# Patient Record
Sex: Female | Born: 1950
Health system: Southern US, Community
[De-identification: ages and names within clinical notes are randomized; demographics above are authoritative.]

## PROBLEM LIST (undated history)

## (undated) DIAGNOSIS — IMO0002 Reserved for concepts with insufficient information to code with codable children: Secondary | ICD-10-CM

## (undated) DIAGNOSIS — T8859XA Other complications of anesthesia, initial encounter: Secondary | ICD-10-CM

## (undated) DIAGNOSIS — Z9049 Acquired absence of other specified parts of digestive tract: Secondary | ICD-10-CM

## (undated) DIAGNOSIS — Z720 Tobacco use: Secondary | ICD-10-CM

## (undated) DIAGNOSIS — M797 Fibromyalgia: Secondary | ICD-10-CM

## (undated) DIAGNOSIS — E039 Hypothyroidism, unspecified: Secondary | ICD-10-CM

## (undated) DIAGNOSIS — F329 Major depressive disorder, single episode, unspecified: Secondary | ICD-10-CM

## (undated) DIAGNOSIS — T4145XA Adverse effect of unspecified anesthetic, initial encounter: Secondary | ICD-10-CM

## (undated) DIAGNOSIS — M1990 Unspecified osteoarthritis, unspecified site: Secondary | ICD-10-CM

## (undated) DIAGNOSIS — R002 Palpitations: Secondary | ICD-10-CM

## (undated) DIAGNOSIS — Z823 Family history of stroke: Secondary | ICD-10-CM

## (undated) DIAGNOSIS — M329 Systemic lupus erythematosus, unspecified: Secondary | ICD-10-CM

## (undated) DIAGNOSIS — G43909 Migraine, unspecified, not intractable, without status migrainosus: Secondary | ICD-10-CM

## (undated) DIAGNOSIS — K219 Gastro-esophageal reflux disease without esophagitis: Secondary | ICD-10-CM

## (undated) DIAGNOSIS — F32A Depression, unspecified: Secondary | ICD-10-CM

## (undated) DIAGNOSIS — M359 Systemic involvement of connective tissue, unspecified: Secondary | ICD-10-CM

## (undated) DIAGNOSIS — R079 Chest pain, unspecified: Secondary | ICD-10-CM

## (undated) HISTORY — DX: Acquired absence of other specified parts of digestive tract: Z90.49

## (undated) HISTORY — DX: Systemic lupus erythematosus, unspecified: M32.9

## (undated) HISTORY — DX: Depression, unspecified: F32.A

## (undated) HISTORY — PX: CARDIAC CATHETERIZATION: SHX172

## (undated) HISTORY — DX: Major depressive disorder, single episode, unspecified: F32.9

## (undated) HISTORY — DX: Gastro-esophageal reflux disease without esophagitis: K21.9

## (undated) HISTORY — DX: Hypothyroidism, unspecified: E03.9

## (undated) HISTORY — DX: Unspecified osteoarthritis, unspecified site: M19.90

## (undated) HISTORY — PX: BILATERAL OOPHORECTOMY: SHX1221

## (undated) HISTORY — DX: Migraine, unspecified, not intractable, without status migrainosus: G43.909

## (undated) HISTORY — DX: Chest pain, unspecified: R07.9

## (undated) HISTORY — DX: Fibromyalgia: M79.7

## (undated) HISTORY — DX: Tobacco use: Z72.0

## (undated) HISTORY — PX: CHOLECYSTECTOMY: SHX55

## (undated) HISTORY — DX: Family history of stroke: Z82.3

## (undated) HISTORY — DX: Palpitations: R00.2

## (undated) HISTORY — DX: Reserved for concepts with insufficient information to code with codable children: IMO0002

---
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
## 2009-05-31 ENCOUNTER — Ambulatory Visit: Payer: Self-pay | Admitting: Cardiology

## 2009-05-31 ENCOUNTER — Encounter: Payer: Self-pay | Admitting: Cardiology

## 2009-05-31 ENCOUNTER — Ambulatory Visit: Payer: Self-pay | Admitting: Cardiovascular Disease

## 2009-05-31 ENCOUNTER — Inpatient Hospital Stay (HOSPITAL_COMMUNITY): Admission: EM | Admit: 2009-05-31 | Discharge: 2009-06-01 | Payer: Self-pay | Admitting: Cardiology

## 2009-05-31 ENCOUNTER — Encounter: Payer: Self-pay | Admitting: Physician Assistant

## 2009-06-01 ENCOUNTER — Encounter (INDEPENDENT_AMBULATORY_CARE_PROVIDER_SITE_OTHER): Payer: Self-pay | Admitting: Physician Assistant

## 2009-06-01 ENCOUNTER — Encounter: Payer: Self-pay | Admitting: Cardiology

## 2009-06-21 ENCOUNTER — Encounter: Payer: Self-pay | Admitting: Cardiology

## 2009-06-21 DIAGNOSIS — F17200 Nicotine dependence, unspecified, uncomplicated: Secondary | ICD-10-CM | POA: Insufficient documentation

## 2009-06-21 DIAGNOSIS — IMO0001 Reserved for inherently not codable concepts without codable children: Secondary | ICD-10-CM | POA: Insufficient documentation

## 2009-06-21 DIAGNOSIS — F329 Major depressive disorder, single episode, unspecified: Secondary | ICD-10-CM | POA: Insufficient documentation

## 2009-06-21 DIAGNOSIS — I200 Unstable angina: Secondary | ICD-10-CM | POA: Insufficient documentation

## 2009-06-21 DIAGNOSIS — E039 Hypothyroidism, unspecified: Secondary | ICD-10-CM | POA: Insufficient documentation

## 2009-06-21 DIAGNOSIS — M329 Systemic lupus erythematosus, unspecified: Secondary | ICD-10-CM | POA: Insufficient documentation

## 2009-06-21 DIAGNOSIS — R072 Precordial pain: Secondary | ICD-10-CM | POA: Insufficient documentation

## 2009-07-16 ENCOUNTER — Encounter: Payer: Self-pay | Admitting: Cardiology

## 2009-07-23 ENCOUNTER — Ambulatory Visit: Payer: Self-pay | Admitting: Cardiology

## 2009-07-23 DIAGNOSIS — F411 Generalized anxiety disorder: Secondary | ICD-10-CM | POA: Insufficient documentation

## 2010-06-04 NOTE — Letter (Signed)
Summary: Appointment -missed  [facility] at [facility] [address]   [place], [place] [address]   Phone: [phone]  Fax: [phone]     June 21, 2009 MRN: [id number]      Susan Hodge [address] [place], [place]  [address]      Dear [name],  Our records indicate you missed your appointment on June 21, 2009                        with Dr. Andee Lineman.   It is very important that we reach you to reschedule this appointment. We look forward to participating in your health care needs.   Please contact us at the number listed above at your earliest convenience to reschedule this appointment.   Sincerely,    Glass blower/designer

## 2010-06-04 NOTE — Consult Note (Signed)
Summary: CARDIOLOGY CONSULT. [facility]  CARDIOLOGY CONSULT. [facility]   Imported By: Zachary George 06/21/2009 08:41:12  _____________________________________________________________________  External Attachment:    Type:   Image     Comment:   External Document

## 2010-06-04 NOTE — Assessment & Plan Note (Signed)
Summary: [facility]   Visit Type:  Follow-up Primary Provider:  vyas  CC:  follow-up visit.  History of Present Illness: the patient is a 60 J female recently hospitalized with chest pain. Because of multiple cardiac risk factors the patient was referred for cardiac catheterization. She was found to have no significant coronary artery disease. She has history of SLE and GERD. The patient states that she is under significant stress. She feels very anxious and irritable. She has difficulty sleeping and has frequent ruminating thoughts. Sometimes she feels she is experiencing shaking spells.X.  Cardiovascular perspective she can quite well. She reports no orthopnea PND. She reports no palpitations or syncope.  Preventive Screening-Counseling & Management  Alcohol-Tobacco     Smoking Status: current     Smoking Cessation Counseling: yes     Packs/Day: 0.75  Current Medications (verified): 1)  Aspirin 81 Mg Chew (Aspirin) .... Take 1 Tablet By Mouth Once A Day 2)  Fioricet 50-325-40 Mg Tabs (Butalbital-Apap-Caffeine) .... Take One By Mouth Every Six Hours As Needed 3)  Prevacid 30 Mg Cpdr (Lansoprazole) .... Take 1 Tablet By Mouth Once A Day 4)  Vitamin D 1000 Unit Tabs (Cholecalciferol) .... Take 1 Tablet By Mouth Once A Day 5)  Vitamin E 1000 Unit Caps (Vitamin E) .... Take 1 Tablet By Mouth Once A Day 6)  Alprazolam 0.5 Mg Tabs (Alprazolam) .... Take 1 Tablet By Mouth Once A Day 7)  Valproic Acid 250 Mg Caps (Valproic Acid) .... Take Twice A Day  Allergies (verified): 1)  ! * Amitriptyline 2)  ! Morphine 3)  ! Lasix 4)  ! Nsaids 5)  ! Neurontin 6)  ! Vicodin 7)  ! * Zanaflex 8)  ! Plaquenil 9)  ! * Allegra 10)  ! * Topamax  Comments:  Nurse/Medical Assistant: The patient's medications and allergies were reviewed with the patient and were updated in the Medication and Allergy Lists. Youlanda Mighty RN (July 23, 2009 3:30 PM)  Past History:  Past Medical  History: Last updated: Jul 08, 2009 Chest pains  Tobacco use.   Allergy or intolerance to Allegra, amitriptyline, doxepin,       nonsteroidals, Topamax, morphine, Neurontin, Vicodin, Zanaflex,       Lasix, and Plaquenil.   Hypothyroidism.   Lupus.   Fibromyalgia.   Gastroesophageal reflux disease.   Degenerative joint disease.   Depression.   Migraines.  Palpitations.  Status post cholecystectomy, appendectomy, and bilateral       oophorectomy.  Family history of cerebrovascular accident in her father.   Past Surgical History: Last updated: 07/08/09 Cardiac Catheterization Cholecystectomy Bilateral oophorectomy  Family History: Last updated: 2009/07/08 FATHER DIED IN 70'S OF A STROKE  Social History: Last updated: 08-Jul-2009 LIVES IN [place] HAS 3 SONS TOTAL DISABILITY SMOKING AT LEAST A PACK SINCE AGE 24 DENIES ALCOHOL USE.  Risk Factors: Smoking Status: current (07/23/2009) Packs/Day: 0.75 (07/23/2009)  Social History: Smoking Status:  current Packs/Day:  0.75  Review of Systems       The patient complains of joint pain.  The patient denies fatigue, malaise, fever, weight gain/loss, vision loss, decreased hearing, hoarseness, chest pain, palpitations, shortness of breath, prolonged cough, wheezing, sleep apnea, coughing up blood, abdominal pain, blood in stool, nausea, vomiting, diarrhea, heartburn, incontinence, blood in urine, muscle weakness, leg swelling, rash, skin lesions, headache, fainting, dizziness, depression, anxiety, enlarged lymph nodes, easy bruising or bleeding, and environmental allergies.    Vital Signs:  Patient profile:   60 year old female Height:  61 inches Weight:      133 pounds BMI:     25.22 Pulse rate:   94 / minute BP sitting:   120 / 75  (left arm) Cuff size:   regular  Vitals Entered By: Youlanda Mighty RN (July 23, 2009 3:19 PM)  Nutrition Counseling: Patient's BMI is greater than 25 and therefore counseled on weight  management options. CC: follow-up visit   Physical Exam  Additional Exam:  General: Well-developed, well-nourished in no distress head: Normocephalic and atraumatic eyes PERRLA/EOMI intact, conjunctiva and lids normal nose: No deformity or lesions mouth normal dentition, normal posterior pharynx neck: Supple, no JVD.  No masses, thyromegaly or abnormal cervical nodes lungs: Normal breath sounds bilaterally without wheezing.  Normal percussion heart: regular rate and rhythm with normal S1 and S2, no S3 or S4.  PMI is normal.  No pathological murmurs abdomen: Normal bowel sounds, abdomen is soft and nontender without masses, organomegaly or hernias noted.  No hepatosplenomegaly musculoskeletal: Back normal, normal gait muscle strength and tone normal pulsus: Pulse is normal in all 4 extremities Extremities: No peripheral pitting edema neurologic: Alert and oriented x 3 skin: Intact without lesions or rashes cervical nodes: No significant adenopathy psychologic: Normal affect    Impression & Recommendations:  Problem # 1:  NONDEPENDENT TOBACCO USE DISORDER (ICD-305.1) I counseled the patient regarding her tobacco use.  Problem # 2:  INTERMEDIATE CORONARY SYNDROME (ICD-411.1) the patient atypical chest pain and negative cardiac catheterization. Continue risk factor modification and aspirin Her updated medication list for this problem includes:    Aspirin 81 Mg Chew (Aspirin) .Marland Kitchen... Take 1 tablet by mouth once a day  Problem # 3:  LUPUS NEPHRITIS (ICD-710.0) Assessment: Comment Only  Her updated medication list for this problem includes:    Aspirin 81 Mg Chew (Aspirin) .Marland Kitchen... Take 1 tablet by mouth once a day  Problem # 4:  ANXIETY DISORDER, GENERALIZED (ICD-300.02) the patienthas a generalized anxiety disorder also likely explaining her insomnia.I recommend followup with her primary care physician for possible medical treatment  Patient Instructions: 1)  Your physician  recommends that you continue on your current medications as directed. Please refer to the Current Medication list given to you today. 2)  Follow up in  1 year.

## 2010-06-04 NOTE — Letter (Signed)
Summary: [facility]  [facility]   Imported By: Claudette Laws 07/27/2009 11:00:02  _____________________________________________________________________  External Attachment:    Type:   Image     Comment:   External Document

## 2010-06-04 NOTE — Letter (Signed)
Summary: [facility] D/C DR. VYAS  [facility]C DR. [facility]   Imported By: Zachary George 06/21/2009 08:45:32  _____________________________________________________________________  External Attachment:    Type:   Image     Comment:   External Document

## 2010-07-22 LAB — BASIC METABOLIC PANEL
BUN: 9 mg/dL (ref 6–23)
CO2: 26 mEq/L (ref 19–32)
Calcium: 9 mg/dL (ref 8.4–10.5)
Chloride: 103 mEq/L (ref 96–112)
Creatinine, Ser: 0.89 mg/dL (ref 0.4–1.2)
GFR calc Af Amer: >60 mL/min (ref >60)
GFR calc non Af Amer: >60 mL/min (ref >60)
Glucose, Bld: 97 mg/dL (ref 70–99)
Potassium: 3.9 mEq/L (ref 3.5–5.1)
Sodium: 137 mEq/L (ref 135–145)

## 2010-07-22 LAB — LIPID PANEL
Cholesterol: 163 mg/dL (ref 0–200)
HDL: 31 mg/dL — ABNORMAL LOW (ref >39)
LDL Cholesterol: 108 mg/dL — ABNORMAL HIGH (ref 0–99)
Total CHOL/HDL Ratio: 5.3 RATIO
Triglycerides: 118 mg/dL (ref <150)
VLDL: 24 mg/dL (ref 0–40)

## 2010-07-22 LAB — CARDIAC PANEL(CRET KIN+CKTOT+MB+TROPI)
CK, MB: 0.7 ng/mL (ref 0.3–4.0)
CK, MB: 0.7 ng/mL (ref 0.3–4.0)
CK, MB: 0.8 ng/mL (ref 0.3–4.0)
Relative Index: INVALID (ref 0.0–2.5)
Relative Index: INVALID (ref 0.0–2.5)
Relative Index: INVALID (ref 0.0–2.5)
Total CK: 49 U/L (ref 7–177)
Total CK: 52 U/L (ref 7–177)
Total CK: 55 U/L (ref 7–177)
Troponin I: 0.01 ng/mL (ref 0.00–0.06)
Troponin I: 0.03 ng/mL (ref 0.00–0.06)
Troponin I: <0.01 ng/mL (ref 0.00–0.06)

## 2010-07-22 LAB — PROTIME-INR
INR: 1.04 (ref 0.00–1.49)
Prothrombin Time: 13.5 seconds (ref 11.6–15.2)

## 2010-07-22 LAB — TSH: TSH: 3.997 u[IU]/mL (ref 0.350–4.500)

## 2010-07-22 LAB — APTT: aPTT: 32 seconds (ref 24–37)

## 2011-10-22 ENCOUNTER — Encounter: Payer: Self-pay | Admitting: *Deleted

## 2016-05-08 DIAGNOSIS — R251 Tremor, unspecified: Secondary | ICD-10-CM | POA: Diagnosis not present

## 2016-05-08 DIAGNOSIS — M329 Systemic lupus erythematosus, unspecified: Secondary | ICD-10-CM | POA: Diagnosis not present

## 2016-05-08 DIAGNOSIS — Z79891 Long term (current) use of opiate analgesic: Secondary | ICD-10-CM | POA: Diagnosis not present

## 2016-05-08 DIAGNOSIS — M545 Low back pain: Secondary | ICD-10-CM | POA: Diagnosis not present

## 2016-05-08 DIAGNOSIS — M519 Unspecified thoracic, thoracolumbar and lumbosacral intervertebral disc disorder: Secondary | ICD-10-CM | POA: Diagnosis not present

## 2016-05-08 DIAGNOSIS — M130 Polyarthritis, unspecified: Secondary | ICD-10-CM | POA: Diagnosis not present

## 2016-05-08 DIAGNOSIS — G43709 Chronic migraine without aura, not intractable, without status migrainosus: Secondary | ICD-10-CM | POA: Diagnosis not present

## 2016-06-06 DIAGNOSIS — R251 Tremor, unspecified: Secondary | ICD-10-CM | POA: Diagnosis not present

## 2016-06-06 DIAGNOSIS — M519 Unspecified thoracic, thoracolumbar and lumbosacral intervertebral disc disorder: Secondary | ICD-10-CM | POA: Diagnosis not present

## 2016-06-06 DIAGNOSIS — G43709 Chronic migraine without aura, not intractable, without status migrainosus: Secondary | ICD-10-CM | POA: Diagnosis not present

## 2016-06-06 DIAGNOSIS — Z79891 Long term (current) use of opiate analgesic: Secondary | ICD-10-CM | POA: Diagnosis not present

## 2016-06-06 DIAGNOSIS — M329 Systemic lupus erythematosus, unspecified: Secondary | ICD-10-CM | POA: Diagnosis not present

## 2016-06-06 DIAGNOSIS — M130 Polyarthritis, unspecified: Secondary | ICD-10-CM | POA: Diagnosis not present

## 2016-06-06 DIAGNOSIS — M545 Low back pain: Secondary | ICD-10-CM | POA: Diagnosis not present

## 2016-06-27 DIAGNOSIS — M545 Low back pain: Secondary | ICD-10-CM | POA: Diagnosis not present

## 2016-06-27 DIAGNOSIS — M519 Unspecified thoracic, thoracolumbar and lumbosacral intervertebral disc disorder: Secondary | ICD-10-CM | POA: Diagnosis not present

## 2016-06-27 DIAGNOSIS — G43709 Chronic migraine without aura, not intractable, without status migrainosus: Secondary | ICD-10-CM | POA: Diagnosis not present

## 2016-06-27 DIAGNOSIS — M329 Systemic lupus erythematosus, unspecified: Secondary | ICD-10-CM | POA: Diagnosis not present

## 2016-06-27 DIAGNOSIS — Z79891 Long term (current) use of opiate analgesic: Secondary | ICD-10-CM | POA: Diagnosis not present

## 2016-06-27 DIAGNOSIS — M130 Polyarthritis, unspecified: Secondary | ICD-10-CM | POA: Diagnosis not present

## 2016-06-27 DIAGNOSIS — R251 Tremor, unspecified: Secondary | ICD-10-CM | POA: Diagnosis not present

## 2016-06-27 DIAGNOSIS — M461 Sacroiliitis, not elsewhere classified: Secondary | ICD-10-CM | POA: Diagnosis not present

## 2016-08-05 DIAGNOSIS — M519 Unspecified thoracic, thoracolumbar and lumbosacral intervertebral disc disorder: Secondary | ICD-10-CM | POA: Diagnosis not present

## 2016-08-05 DIAGNOSIS — Z79891 Long term (current) use of opiate analgesic: Secondary | ICD-10-CM | POA: Diagnosis not present

## 2016-08-05 DIAGNOSIS — G43709 Chronic migraine without aura, not intractable, without status migrainosus: Secondary | ICD-10-CM | POA: Diagnosis not present

## 2016-08-05 DIAGNOSIS — M545 Low back pain: Secondary | ICD-10-CM | POA: Diagnosis not present

## 2016-08-05 DIAGNOSIS — R251 Tremor, unspecified: Secondary | ICD-10-CM | POA: Diagnosis not present

## 2016-08-05 DIAGNOSIS — M130 Polyarthritis, unspecified: Secondary | ICD-10-CM | POA: Diagnosis not present

## 2016-08-05 DIAGNOSIS — M329 Systemic lupus erythematosus, unspecified: Secondary | ICD-10-CM | POA: Diagnosis not present

## 2016-08-06 DIAGNOSIS — Z713 Dietary counseling and surveillance: Secondary | ICD-10-CM | POA: Diagnosis not present

## 2016-08-06 DIAGNOSIS — Z6821 Body mass index (BMI) 21.0-21.9, adult: Secondary | ICD-10-CM | POA: Diagnosis not present

## 2016-08-06 DIAGNOSIS — Z299 Encounter for prophylactic measures, unspecified: Secondary | ICD-10-CM | POA: Diagnosis not present

## 2016-08-06 DIAGNOSIS — M797 Fibromyalgia: Secondary | ICD-10-CM | POA: Diagnosis not present

## 2016-08-06 DIAGNOSIS — M1990 Unspecified osteoarthritis, unspecified site: Secondary | ICD-10-CM | POA: Diagnosis not present

## 2016-08-06 DIAGNOSIS — R69 Illness, unspecified: Secondary | ICD-10-CM | POA: Diagnosis not present

## 2016-08-06 DIAGNOSIS — E559 Vitamin D deficiency, unspecified: Secondary | ICD-10-CM | POA: Diagnosis not present

## 2016-08-06 DIAGNOSIS — M329 Systemic lupus erythematosus, unspecified: Secondary | ICD-10-CM | POA: Diagnosis not present

## 2016-08-19 DIAGNOSIS — J069 Acute upper respiratory infection, unspecified: Secondary | ICD-10-CM | POA: Diagnosis not present

## 2016-08-19 DIAGNOSIS — M329 Systemic lupus erythematosus, unspecified: Secondary | ICD-10-CM | POA: Diagnosis not present

## 2016-08-19 DIAGNOSIS — R69 Illness, unspecified: Secondary | ICD-10-CM | POA: Diagnosis not present

## 2016-08-19 DIAGNOSIS — M797 Fibromyalgia: Secondary | ICD-10-CM | POA: Diagnosis not present

## 2016-08-19 DIAGNOSIS — Z6821 Body mass index (BMI) 21.0-21.9, adult: Secondary | ICD-10-CM | POA: Diagnosis not present

## 2016-08-19 DIAGNOSIS — Z299 Encounter for prophylactic measures, unspecified: Secondary | ICD-10-CM | POA: Diagnosis not present

## 2016-09-09 DIAGNOSIS — M329 Systemic lupus erythematosus, unspecified: Secondary | ICD-10-CM | POA: Diagnosis not present

## 2016-09-09 DIAGNOSIS — R69 Illness, unspecified: Secondary | ICD-10-CM | POA: Diagnosis not present

## 2016-09-09 DIAGNOSIS — Z6822 Body mass index (BMI) 22.0-22.9, adult: Secondary | ICD-10-CM | POA: Diagnosis not present

## 2016-09-09 DIAGNOSIS — H6690 Otitis media, unspecified, unspecified ear: Secondary | ICD-10-CM | POA: Diagnosis not present

## 2016-09-09 DIAGNOSIS — M797 Fibromyalgia: Secondary | ICD-10-CM | POA: Diagnosis not present

## 2016-09-09 DIAGNOSIS — Z299 Encounter for prophylactic measures, unspecified: Secondary | ICD-10-CM | POA: Diagnosis not present

## 2016-09-26 DIAGNOSIS — M130 Polyarthritis, unspecified: Secondary | ICD-10-CM | POA: Diagnosis not present

## 2016-09-26 DIAGNOSIS — G43709 Chronic migraine without aura, not intractable, without status migrainosus: Secondary | ICD-10-CM | POA: Diagnosis not present

## 2016-09-26 DIAGNOSIS — M329 Systemic lupus erythematosus, unspecified: Secondary | ICD-10-CM | POA: Diagnosis not present

## 2016-09-26 DIAGNOSIS — R69 Illness, unspecified: Secondary | ICD-10-CM | POA: Diagnosis not present

## 2016-09-26 DIAGNOSIS — M545 Low back pain: Secondary | ICD-10-CM | POA: Diagnosis not present

## 2016-09-26 DIAGNOSIS — M461 Sacroiliitis, not elsewhere classified: Secondary | ICD-10-CM | POA: Diagnosis not present

## 2016-09-26 DIAGNOSIS — R251 Tremor, unspecified: Secondary | ICD-10-CM | POA: Diagnosis not present

## 2016-09-26 DIAGNOSIS — Z79891 Long term (current) use of opiate analgesic: Secondary | ICD-10-CM | POA: Diagnosis not present

## 2016-09-26 DIAGNOSIS — M519 Unspecified thoracic, thoracolumbar and lumbosacral intervertebral disc disorder: Secondary | ICD-10-CM | POA: Diagnosis not present

## 2016-11-04 DIAGNOSIS — M797 Fibromyalgia: Secondary | ICD-10-CM | POA: Diagnosis not present

## 2016-11-04 DIAGNOSIS — Z299 Encounter for prophylactic measures, unspecified: Secondary | ICD-10-CM | POA: Diagnosis not present

## 2016-11-04 DIAGNOSIS — Z6822 Body mass index (BMI) 22.0-22.9, adult: Secondary | ICD-10-CM | POA: Diagnosis not present

## 2016-11-04 DIAGNOSIS — M329 Systemic lupus erythematosus, unspecified: Secondary | ICD-10-CM | POA: Diagnosis not present

## 2016-11-04 DIAGNOSIS — R69 Illness, unspecified: Secondary | ICD-10-CM | POA: Diagnosis not present

## 2016-11-04 DIAGNOSIS — Z91038 Other insect allergy status: Secondary | ICD-10-CM | POA: Diagnosis not present

## 2016-11-04 DIAGNOSIS — G43909 Migraine, unspecified, not intractable, without status migrainosus: Secondary | ICD-10-CM | POA: Diagnosis not present

## 2016-12-02 DIAGNOSIS — R251 Tremor, unspecified: Secondary | ICD-10-CM | POA: Diagnosis not present

## 2016-12-02 DIAGNOSIS — Z79891 Long term (current) use of opiate analgesic: Secondary | ICD-10-CM | POA: Diagnosis not present

## 2016-12-02 DIAGNOSIS — G43709 Chronic migraine without aura, not intractable, without status migrainosus: Secondary | ICD-10-CM | POA: Diagnosis not present

## 2016-12-02 DIAGNOSIS — M130 Polyarthritis, unspecified: Secondary | ICD-10-CM | POA: Diagnosis not present

## 2016-12-02 DIAGNOSIS — M519 Unspecified thoracic, thoracolumbar and lumbosacral intervertebral disc disorder: Secondary | ICD-10-CM | POA: Diagnosis not present

## 2016-12-02 DIAGNOSIS — M545 Low back pain: Secondary | ICD-10-CM | POA: Diagnosis not present

## 2016-12-02 DIAGNOSIS — M329 Systemic lupus erythematosus, unspecified: Secondary | ICD-10-CM | POA: Diagnosis not present

## 2017-01-29 DIAGNOSIS — G4459 Other complicated headache syndrome: Secondary | ICD-10-CM | POA: Diagnosis not present

## 2017-01-29 DIAGNOSIS — Z79891 Long term (current) use of opiate analgesic: Secondary | ICD-10-CM | POA: Diagnosis not present

## 2017-01-29 DIAGNOSIS — M130 Polyarthritis, unspecified: Secondary | ICD-10-CM | POA: Diagnosis not present

## 2017-01-29 DIAGNOSIS — R251 Tremor, unspecified: Secondary | ICD-10-CM | POA: Diagnosis not present

## 2017-02-19 DIAGNOSIS — G43909 Migraine, unspecified, not intractable, without status migrainosus: Secondary | ICD-10-CM | POA: Diagnosis not present

## 2017-02-19 DIAGNOSIS — Z299 Encounter for prophylactic measures, unspecified: Secondary | ICD-10-CM | POA: Diagnosis not present

## 2017-02-19 DIAGNOSIS — Z6822 Body mass index (BMI) 22.0-22.9, adult: Secondary | ICD-10-CM | POA: Diagnosis not present

## 2017-02-19 DIAGNOSIS — M797 Fibromyalgia: Secondary | ICD-10-CM | POA: Diagnosis not present

## 2017-02-19 DIAGNOSIS — Z23 Encounter for immunization: Secondary | ICD-10-CM | POA: Diagnosis not present

## 2017-02-19 DIAGNOSIS — M329 Systemic lupus erythematosus, unspecified: Secondary | ICD-10-CM | POA: Diagnosis not present

## 2017-02-19 DIAGNOSIS — K219 Gastro-esophageal reflux disease without esophagitis: Secondary | ICD-10-CM | POA: Diagnosis not present

## 2017-03-30 DIAGNOSIS — G43709 Chronic migraine without aura, not intractable, without status migrainosus: Secondary | ICD-10-CM | POA: Diagnosis not present

## 2017-03-30 DIAGNOSIS — M130 Polyarthritis, unspecified: Secondary | ICD-10-CM | POA: Diagnosis not present

## 2017-03-30 DIAGNOSIS — M461 Sacroiliitis, not elsewhere classified: Secondary | ICD-10-CM | POA: Diagnosis not present

## 2017-03-30 DIAGNOSIS — M519 Unspecified thoracic, thoracolumbar and lumbosacral intervertebral disc disorder: Secondary | ICD-10-CM | POA: Diagnosis not present

## 2017-03-30 DIAGNOSIS — M545 Low back pain: Secondary | ICD-10-CM | POA: Diagnosis not present

## 2017-03-30 DIAGNOSIS — Z79891 Long term (current) use of opiate analgesic: Secondary | ICD-10-CM | POA: Diagnosis not present

## 2017-04-06 DIAGNOSIS — R69 Illness, unspecified: Secondary | ICD-10-CM | POA: Diagnosis not present

## 2017-05-28 DIAGNOSIS — M797 Fibromyalgia: Secondary | ICD-10-CM | POA: Diagnosis not present

## 2017-05-28 DIAGNOSIS — M329 Systemic lupus erythematosus, unspecified: Secondary | ICD-10-CM | POA: Diagnosis not present

## 2017-05-28 DIAGNOSIS — Z299 Encounter for prophylactic measures, unspecified: Secondary | ICD-10-CM | POA: Diagnosis not present

## 2017-05-28 DIAGNOSIS — Z6822 Body mass index (BMI) 22.0-22.9, adult: Secondary | ICD-10-CM | POA: Diagnosis not present

## 2017-05-28 DIAGNOSIS — R69 Illness, unspecified: Secondary | ICD-10-CM | POA: Diagnosis not present

## 2017-06-25 DIAGNOSIS — G43709 Chronic migraine without aura, not intractable, without status migrainosus: Secondary | ICD-10-CM | POA: Diagnosis not present

## 2017-06-25 DIAGNOSIS — M130 Polyarthritis, unspecified: Secondary | ICD-10-CM | POA: Diagnosis not present

## 2017-06-25 DIAGNOSIS — Z79891 Long term (current) use of opiate analgesic: Secondary | ICD-10-CM | POA: Diagnosis not present

## 2017-06-25 DIAGNOSIS — M545 Low back pain: Secondary | ICD-10-CM | POA: Diagnosis not present

## 2017-09-01 DIAGNOSIS — M797 Fibromyalgia: Secondary | ICD-10-CM | POA: Diagnosis not present

## 2017-09-01 DIAGNOSIS — M329 Systemic lupus erythematosus, unspecified: Secondary | ICD-10-CM | POA: Diagnosis not present

## 2017-09-01 DIAGNOSIS — Z6824 Body mass index (BMI) 24.0-24.9, adult: Secondary | ICD-10-CM | POA: Diagnosis not present

## 2017-09-01 DIAGNOSIS — R69 Illness, unspecified: Secondary | ICD-10-CM | POA: Diagnosis not present

## 2017-09-01 DIAGNOSIS — Z299 Encounter for prophylactic measures, unspecified: Secondary | ICD-10-CM | POA: Diagnosis not present

## 2017-09-21 DIAGNOSIS — M545 Low back pain: Secondary | ICD-10-CM | POA: Diagnosis not present

## 2017-09-21 DIAGNOSIS — R251 Tremor, unspecified: Secondary | ICD-10-CM | POA: Diagnosis not present

## 2017-09-21 DIAGNOSIS — M130 Polyarthritis, unspecified: Secondary | ICD-10-CM | POA: Diagnosis not present

## 2017-09-21 DIAGNOSIS — G43709 Chronic migraine without aura, not intractable, without status migrainosus: Secondary | ICD-10-CM | POA: Diagnosis not present

## 2017-09-26 DIAGNOSIS — M797 Fibromyalgia: Secondary | ICD-10-CM | POA: Diagnosis not present

## 2017-09-26 DIAGNOSIS — S0501XA Injury of conjunctiva and corneal abrasion without foreign body, right eye, initial encounter: Secondary | ICD-10-CM | POA: Diagnosis not present

## 2017-09-26 DIAGNOSIS — M329 Systemic lupus erythematosus, unspecified: Secondary | ICD-10-CM | POA: Diagnosis not present

## 2017-09-26 DIAGNOSIS — H538 Other visual disturbances: Secondary | ICD-10-CM | POA: Diagnosis not present

## 2017-09-26 DIAGNOSIS — L539 Erythematous condition, unspecified: Secondary | ICD-10-CM | POA: Diagnosis not present

## 2017-09-26 DIAGNOSIS — B029 Zoster without complications: Secondary | ICD-10-CM | POA: Diagnosis not present

## 2017-09-26 DIAGNOSIS — R21 Rash and other nonspecific skin eruption: Secondary | ICD-10-CM | POA: Diagnosis not present

## 2017-09-26 DIAGNOSIS — X58XXXA Exposure to other specified factors, initial encounter: Secondary | ICD-10-CM | POA: Diagnosis not present

## 2017-09-26 DIAGNOSIS — Z79899 Other long term (current) drug therapy: Secondary | ICD-10-CM | POA: Diagnosis not present

## 2017-09-26 DIAGNOSIS — R51 Headache: Secondary | ICD-10-CM | POA: Diagnosis not present

## 2017-09-26 DIAGNOSIS — G8929 Other chronic pain: Secondary | ICD-10-CM | POA: Diagnosis not present

## 2017-09-27 DIAGNOSIS — B029 Zoster without complications: Secondary | ICD-10-CM | POA: Diagnosis not present

## 2017-09-27 DIAGNOSIS — Z79899 Other long term (current) drug therapy: Secondary | ICD-10-CM | POA: Diagnosis not present

## 2017-09-27 DIAGNOSIS — B0230 Zoster ocular disease, unspecified: Secondary | ICD-10-CM | POA: Diagnosis not present

## 2017-09-27 DIAGNOSIS — R51 Headache: Secondary | ICD-10-CM | POA: Diagnosis not present

## 2017-09-27 DIAGNOSIS — L539 Erythematous condition, unspecified: Secondary | ICD-10-CM | POA: Diagnosis not present

## 2017-09-27 DIAGNOSIS — R21 Rash and other nonspecific skin eruption: Secondary | ICD-10-CM | POA: Diagnosis not present

## 2017-09-27 DIAGNOSIS — M329 Systemic lupus erythematosus, unspecified: Secondary | ICD-10-CM | POA: Diagnosis not present

## 2017-09-27 DIAGNOSIS — M797 Fibromyalgia: Secondary | ICD-10-CM | POA: Diagnosis not present

## 2017-09-27 DIAGNOSIS — H538 Other visual disturbances: Secondary | ICD-10-CM | POA: Diagnosis not present

## 2017-09-27 DIAGNOSIS — R69 Illness, unspecified: Secondary | ICD-10-CM | POA: Diagnosis not present

## 2017-09-27 DIAGNOSIS — M1990 Unspecified osteoarthritis, unspecified site: Secondary | ICD-10-CM | POA: Diagnosis not present

## 2017-10-09 DIAGNOSIS — M329 Systemic lupus erythematosus, unspecified: Secondary | ICD-10-CM | POA: Diagnosis not present

## 2017-10-09 DIAGNOSIS — R69 Illness, unspecified: Secondary | ICD-10-CM | POA: Diagnosis not present

## 2017-10-09 DIAGNOSIS — Z6822 Body mass index (BMI) 22.0-22.9, adult: Secondary | ICD-10-CM | POA: Diagnosis not present

## 2017-10-09 DIAGNOSIS — Z299 Encounter for prophylactic measures, unspecified: Secondary | ICD-10-CM | POA: Diagnosis not present

## 2017-10-09 DIAGNOSIS — B029 Zoster without complications: Secondary | ICD-10-CM | POA: Diagnosis not present

## 2017-10-20 DIAGNOSIS — B0239 Other herpes zoster eye disease: Secondary | ICD-10-CM | POA: Diagnosis not present

## 2017-10-21 DIAGNOSIS — G43709 Chronic migraine without aura, not intractable, without status migrainosus: Secondary | ICD-10-CM | POA: Diagnosis not present

## 2017-10-21 DIAGNOSIS — B029 Zoster without complications: Secondary | ICD-10-CM | POA: Diagnosis not present

## 2017-10-21 DIAGNOSIS — M130 Polyarthritis, unspecified: Secondary | ICD-10-CM | POA: Diagnosis not present

## 2017-10-21 DIAGNOSIS — M545 Low back pain: Secondary | ICD-10-CM | POA: Diagnosis not present

## 2017-10-21 DIAGNOSIS — Z79891 Long term (current) use of opiate analgesic: Secondary | ICD-10-CM | POA: Diagnosis not present

## 2017-10-27 DIAGNOSIS — B0239 Other herpes zoster eye disease: Secondary | ICD-10-CM | POA: Diagnosis not present

## 2017-11-04 DIAGNOSIS — B0239 Other herpes zoster eye disease: Secondary | ICD-10-CM | POA: Diagnosis not present

## 2017-11-18 DIAGNOSIS — H2513 Age-related nuclear cataract, bilateral: Secondary | ICD-10-CM | POA: Diagnosis not present

## 2017-11-18 DIAGNOSIS — B0239 Other herpes zoster eye disease: Secondary | ICD-10-CM | POA: Diagnosis not present

## 2017-12-09 DIAGNOSIS — Z6822 Body mass index (BMI) 22.0-22.9, adult: Secondary | ICD-10-CM | POA: Diagnosis not present

## 2017-12-09 DIAGNOSIS — R69 Illness, unspecified: Secondary | ICD-10-CM | POA: Diagnosis not present

## 2017-12-09 DIAGNOSIS — R51 Headache: Secondary | ICD-10-CM | POA: Diagnosis not present

## 2017-12-09 DIAGNOSIS — M797 Fibromyalgia: Secondary | ICD-10-CM | POA: Diagnosis not present

## 2017-12-09 DIAGNOSIS — Z299 Encounter for prophylactic measures, unspecified: Secondary | ICD-10-CM | POA: Diagnosis not present

## 2017-12-09 DIAGNOSIS — B029 Zoster without complications: Secondary | ICD-10-CM | POA: Diagnosis not present

## 2017-12-15 DIAGNOSIS — R69 Illness, unspecified: Secondary | ICD-10-CM | POA: Diagnosis not present

## 2017-12-21 DIAGNOSIS — M545 Low back pain: Secondary | ICD-10-CM | POA: Diagnosis not present

## 2017-12-21 DIAGNOSIS — M130 Polyarthritis, unspecified: Secondary | ICD-10-CM | POA: Diagnosis not present

## 2017-12-21 DIAGNOSIS — G43709 Chronic migraine without aura, not intractable, without status migrainosus: Secondary | ICD-10-CM | POA: Diagnosis not present

## 2017-12-21 DIAGNOSIS — B029 Zoster without complications: Secondary | ICD-10-CM | POA: Diagnosis not present

## 2018-01-27 DIAGNOSIS — R69 Illness, unspecified: Secondary | ICD-10-CM | POA: Diagnosis not present

## 2018-02-17 DIAGNOSIS — M545 Low back pain: Secondary | ICD-10-CM | POA: Diagnosis not present

## 2018-02-17 DIAGNOSIS — B029 Zoster without complications: Secondary | ICD-10-CM | POA: Diagnosis not present

## 2018-02-17 DIAGNOSIS — M130 Polyarthritis, unspecified: Secondary | ICD-10-CM | POA: Diagnosis not present

## 2018-02-17 DIAGNOSIS — G43709 Chronic migraine without aura, not intractable, without status migrainosus: Secondary | ICD-10-CM | POA: Diagnosis not present

## 2018-02-26 DIAGNOSIS — B0239 Other herpes zoster eye disease: Secondary | ICD-10-CM | POA: Diagnosis not present

## 2018-03-02 DIAGNOSIS — R69 Illness, unspecified: Secondary | ICD-10-CM | POA: Diagnosis not present

## 2018-03-16 DIAGNOSIS — B029 Zoster without complications: Secondary | ICD-10-CM | POA: Diagnosis not present

## 2018-03-16 DIAGNOSIS — R69 Illness, unspecified: Secondary | ICD-10-CM | POA: Diagnosis not present

## 2018-03-16 DIAGNOSIS — Z6823 Body mass index (BMI) 23.0-23.9, adult: Secondary | ICD-10-CM | POA: Diagnosis not present

## 2018-03-16 DIAGNOSIS — Z299 Encounter for prophylactic measures, unspecified: Secondary | ICD-10-CM | POA: Diagnosis not present

## 2018-03-16 DIAGNOSIS — J069 Acute upper respiratory infection, unspecified: Secondary | ICD-10-CM | POA: Diagnosis not present

## 2018-03-16 DIAGNOSIS — M797 Fibromyalgia: Secondary | ICD-10-CM | POA: Diagnosis not present

## 2018-04-15 DIAGNOSIS — B029 Zoster without complications: Secondary | ICD-10-CM | POA: Diagnosis not present

## 2018-04-15 DIAGNOSIS — M545 Low back pain: Secondary | ICD-10-CM | POA: Diagnosis not present

## 2018-04-15 DIAGNOSIS — M130 Polyarthritis, unspecified: Secondary | ICD-10-CM | POA: Diagnosis not present

## 2018-04-15 DIAGNOSIS — Z79891 Long term (current) use of opiate analgesic: Secondary | ICD-10-CM | POA: Diagnosis not present

## 2018-04-15 DIAGNOSIS — G43709 Chronic migraine without aura, not intractable, without status migrainosus: Secondary | ICD-10-CM | POA: Diagnosis not present

## 2018-05-11 DIAGNOSIS — R69 Illness, unspecified: Secondary | ICD-10-CM | POA: Diagnosis not present

## 2018-06-22 DIAGNOSIS — R69 Illness, unspecified: Secondary | ICD-10-CM | POA: Diagnosis not present

## 2018-06-22 DIAGNOSIS — R51 Headache: Secondary | ICD-10-CM | POA: Diagnosis not present

## 2018-06-22 DIAGNOSIS — B029 Zoster without complications: Secondary | ICD-10-CM | POA: Diagnosis not present

## 2018-06-22 DIAGNOSIS — Z6823 Body mass index (BMI) 23.0-23.9, adult: Secondary | ICD-10-CM | POA: Diagnosis not present

## 2018-06-22 DIAGNOSIS — Z299 Encounter for prophylactic measures, unspecified: Secondary | ICD-10-CM | POA: Diagnosis not present

## 2018-06-22 DIAGNOSIS — M329 Systemic lupus erythematosus, unspecified: Secondary | ICD-10-CM | POA: Diagnosis not present

## 2018-07-08 DIAGNOSIS — M545 Low back pain: Secondary | ICD-10-CM | POA: Diagnosis not present

## 2018-07-08 DIAGNOSIS — M130 Polyarthritis, unspecified: Secondary | ICD-10-CM | POA: Diagnosis not present

## 2018-07-08 DIAGNOSIS — B029 Zoster without complications: Secondary | ICD-10-CM | POA: Diagnosis not present

## 2018-07-08 DIAGNOSIS — Z79891 Long term (current) use of opiate analgesic: Secondary | ICD-10-CM | POA: Diagnosis not present

## 2018-07-08 DIAGNOSIS — G43709 Chronic migraine without aura, not intractable, without status migrainosus: Secondary | ICD-10-CM | POA: Diagnosis not present

## 2018-07-12 DIAGNOSIS — B0239 Other herpes zoster eye disease: Secondary | ICD-10-CM | POA: Diagnosis not present

## 2018-07-12 DIAGNOSIS — H5203 Hypermetropia, bilateral: Secondary | ICD-10-CM | POA: Diagnosis not present

## 2018-07-19 DIAGNOSIS — Z6823 Body mass index (BMI) 23.0-23.9, adult: Secondary | ICD-10-CM | POA: Diagnosis not present

## 2018-07-19 DIAGNOSIS — Z299 Encounter for prophylactic measures, unspecified: Secondary | ICD-10-CM | POA: Diagnosis not present

## 2018-07-19 DIAGNOSIS — R102 Pelvic and perineal pain: Secondary | ICD-10-CM | POA: Diagnosis not present

## 2018-07-19 DIAGNOSIS — M797 Fibromyalgia: Secondary | ICD-10-CM | POA: Diagnosis not present

## 2018-07-19 DIAGNOSIS — S79912A Unspecified injury of left hip, initial encounter: Secondary | ICD-10-CM | POA: Diagnosis not present

## 2018-07-19 DIAGNOSIS — M25552 Pain in left hip: Secondary | ICD-10-CM | POA: Diagnosis not present

## 2018-07-19 DIAGNOSIS — S3993XA Unspecified injury of pelvis, initial encounter: Secondary | ICD-10-CM | POA: Diagnosis not present

## 2018-07-22 ENCOUNTER — Other Ambulatory Visit: Payer: Self-pay | Admitting: Nurse Practitioner

## 2018-07-22 ENCOUNTER — Other Ambulatory Visit (HOSPITAL_COMMUNITY): Payer: Self-pay | Admitting: Nurse Practitioner

## 2018-07-22 ENCOUNTER — Other Ambulatory Visit (HOSPITAL_COMMUNITY)
Admission: RE | Admit: 2018-07-22 | Discharge: 2018-07-22 | Disposition: A | Discharge status: Home / Self Care | Payer: Medicare HMO | Source: Ambulatory Visit | Attending: Nurse Practitioner | Admitting: Nurse Practitioner

## 2018-07-22 ENCOUNTER — Other Ambulatory Visit: Payer: Self-pay

## 2018-07-22 ENCOUNTER — Ambulatory Visit (HOSPITAL_COMMUNITY)
Admission: RE | Admit: 2018-07-22 | Discharge: 2018-07-22 | Disposition: A | Discharge status: Home / Self Care | Payer: Medicare HMO | Source: Ambulatory Visit | Attending: Nurse Practitioner | Admitting: Nurse Practitioner

## 2018-07-22 DIAGNOSIS — I70219 Atherosclerosis of native arteries of extremities with intermittent claudication, unspecified extremity: Secondary | ICD-10-CM | POA: Insufficient documentation

## 2018-07-22 DIAGNOSIS — Z713 Dietary counseling and surveillance: Secondary | ICD-10-CM | POA: Diagnosis not present

## 2018-07-22 DIAGNOSIS — R680 Hypothermia, not associated with low environmental temperature: Secondary | ICD-10-CM | POA: Diagnosis not present

## 2018-07-22 DIAGNOSIS — Z6823 Body mass index (BMI) 23.0-23.9, adult: Secondary | ICD-10-CM | POA: Diagnosis not present

## 2018-07-22 DIAGNOSIS — Z299 Encounter for prophylactic measures, unspecified: Secondary | ICD-10-CM | POA: Diagnosis not present

## 2018-07-22 DIAGNOSIS — I999 Unspecified disorder of circulatory system: Secondary | ICD-10-CM

## 2018-07-22 DIAGNOSIS — R0989 Other specified symptoms and signs involving the circulatory and respiratory systems: Secondary | ICD-10-CM | POA: Diagnosis not present

## 2018-07-26 ENCOUNTER — Emergency Department (HOSPITAL_COMMUNITY)
Admission: EM | Admit: 2018-07-26 | Discharge: 2018-07-26 | Disposition: A | Discharge status: Home / Self Care | Payer: Medicare HMO | Attending: Emergency Medicine | Admitting: Emergency Medicine

## 2018-07-26 ENCOUNTER — Other Ambulatory Visit: Payer: Self-pay

## 2018-07-26 ENCOUNTER — Encounter (HOSPITAL_COMMUNITY): Payer: Self-pay | Admitting: Emergency Medicine

## 2018-07-26 ENCOUNTER — Emergency Department (HOSPITAL_COMMUNITY): Payer: Medicare HMO

## 2018-07-26 DIAGNOSIS — M5432 Sciatica, left side: Secondary | ICD-10-CM | POA: Insufficient documentation

## 2018-07-26 DIAGNOSIS — R29898 Other symptoms and signs involving the musculoskeletal system: Secondary | ICD-10-CM

## 2018-07-26 DIAGNOSIS — E039 Hypothyroidism, unspecified: Secondary | ICD-10-CM | POA: Insufficient documentation

## 2018-07-26 DIAGNOSIS — Z7982 Long term (current) use of aspirin: Secondary | ICD-10-CM | POA: Insufficient documentation

## 2018-07-26 DIAGNOSIS — F17210 Nicotine dependence, cigarettes, uncomplicated: Secondary | ICD-10-CM | POA: Diagnosis not present

## 2018-07-26 DIAGNOSIS — Z8673 Personal history of transient ischemic attack (TIA), and cerebral infarction without residual deficits: Secondary | ICD-10-CM | POA: Insufficient documentation

## 2018-07-26 DIAGNOSIS — Z79899 Other long term (current) drug therapy: Secondary | ICD-10-CM | POA: Diagnosis not present

## 2018-07-26 DIAGNOSIS — M6281 Muscle weakness (generalized): Secondary | ICD-10-CM | POA: Diagnosis not present

## 2018-07-26 DIAGNOSIS — M545 Low back pain: Secondary | ICD-10-CM | POA: Diagnosis not present

## 2018-07-26 DIAGNOSIS — R531 Weakness: Secondary | ICD-10-CM | POA: Diagnosis present

## 2018-07-26 DIAGNOSIS — M5442 Lumbago with sciatica, left side: Secondary | ICD-10-CM | POA: Diagnosis not present

## 2018-07-26 DIAGNOSIS — R69 Illness, unspecified: Secondary | ICD-10-CM | POA: Diagnosis not present

## 2018-07-26 LAB — BASIC METABOLIC PANEL
Anion gap: 8 (ref 5–15)
BUN: 24 mg/dL — ABNORMAL HIGH (ref 8–23)
CO2: 25 mmol/L (ref 22–32)
Calcium: 9.5 mg/dL (ref 8.9–10.3)
Chloride: 106 mmol/L (ref 98–111)
Creatinine, Ser: 0.81 mg/dL (ref 0.44–1.00)
GFR calc Af Amer: >60 mL/min (ref >60)
GFR calc non Af Amer: >60 mL/min (ref >60)
Glucose, Bld: 118 mg/dL — ABNORMAL HIGH (ref 70–99)
Potassium: 4 mmol/L (ref 3.5–5.1)
Sodium: 139 mmol/L (ref 135–145)

## 2018-07-26 LAB — CBC WITH DIFFERENTIAL/PLATELET
Abs Immature Granulocytes: 0.03 10*3/uL (ref 0.00–0.07)
Basophils Absolute: 0 10*3/uL (ref 0.0–0.1)
Basophils Relative: 0 %
Eosinophils Absolute: 0.1 10*3/uL (ref 0.0–0.5)
Eosinophils Relative: 1 %
HCT: 40.8 % (ref 36.0–46.0)
Hemoglobin: 13.6 g/dL (ref 12.0–15.0)
Immature Granulocytes: 0 %
Lymphocytes Relative: 9 %
Lymphs Abs: 0.8 10*3/uL (ref 0.7–4.0)
MCH: 31.1 pg (ref 26.0–34.0)
MCHC: 33.3 g/dL (ref 30.0–36.0)
MCV: 93.4 fL (ref 80.0–100.0)
Monocytes Absolute: 0.5 10*3/uL (ref 0.1–1.0)
Monocytes Relative: 6 %
Neutro Abs: 7.8 10*3/uL — ABNORMAL HIGH (ref 1.7–7.7)
Neutrophils Relative %: 84 %
Platelets: 200 10*3/uL (ref 150–400)
RBC: 4.37 MIL/uL (ref 3.87–5.11)
RDW: 12.6 % (ref 11.5–15.5)
WBC: 9.3 10*3/uL (ref 4.0–10.5)
nRBC: 0 % (ref 0.0–0.2)

## 2018-07-26 LAB — SEDIMENTATION RATE: Sed Rate: 12 mm/hr (ref 0–22)

## 2018-07-26 LAB — CK: Total CK: 64 U/L (ref 38–234)

## 2018-07-26 MED ORDER — PREDNISONE 10 MG (21) PO TBPK: ORAL_TABLET | Freq: Every day | ORAL | 0 refills | Status: DC

## 2018-07-26 NOTE — ED Provider Notes (Signed)
[facility] EMERGENCY DEPARTMENT Provider Note   CSN: [id number] Arrival date & time: 07/26/18  1543    History   Chief Complaint Chief Complaint  Patient presents with  . Fall    HPI Susan Hodge is a 68 y.o. female.  She has a history of migraines and fibromyalgia along with lupus.  She is complaining of a fall about 3 weeks ago with continued problems with her left leg.  She had initially she was using a cane and then she needed a walker and over the last few days she has been unable to use her left leg at all.  She says there is no strength in it.  It is painful to weight-bear.  She said she has had x-rays and a duplex which has not come up with any answer for her symptoms.  No fevers or chills no nausea no vomiting.  She is being prescribed medicine by a pain specialist.     The history is provided by the patient.  Extremity Weakness  This is a new problem. The current episode started more than 1 week ago. The problem occurs constantly. The problem has been gradually worsening. Associated symptoms include headaches (chronic). Pertinent negatives include no chest pain, no abdominal pain and no shortness of breath. The symptoms are aggravated by standing and walking. Nothing relieves the symptoms. She has tried rest for the symptoms. The treatment provided mild relief.    Past Medical History:  Diagnosis Date  . Chest pain   . Depression   . DJD (degenerative joint disease)   . FH: CVA (cerebrovascular accident)    father  . Fibromyalgia   . GERD (gastroesophageal reflux disease)   . Hypothyroidism   . Lupus ([place])   . Migraines   . Palpitations   . Status post cholecystectomy    appendectomy and bilaateral oophorectomy  . Tobacco use     Patient Active Problem List   Diagnosis Date Noted  . ANXIETY DISORDER, GENERALIZED 07/23/2009  . UNSPECIFIED HYPOTHYROIDISM 06/21/2009  . NONDEPENDENT TOBACCO USE DISORDER 06/21/2009  . DEPRESSIVE DISORDER NOT ELSEWHERE  CLASSIFIED 06/21/2009  . INTERMEDIATE CORONARY SYNDROME 06/21/2009  . LUPUS NEPHRITIS 06/21/2009  . UNSPECIFIED MYALGIA AND MYOSITIS 06/21/2009  . PRECORDIAL PAIN 06/21/2009    Past Surgical History:  Procedure Laterality Date  . BILATERAL OOPHORECTOMY    . CARDIAC CATHETERIZATION    . CHOLECYSTECTOMY       OB History   No obstetric history on file.      Home Medications    Prior to Admission medications   Medication Sig Start Date End Date Taking? Authorizing Provider  ALPRAZolam Duanne Moron) 0.5 MG tablet Take 0.5 mg by mouth at bedtime as needed.    [provider]  aspirin 81 MG chewable tablet Chew 81 mg by mouth daily.    [provider]  butalbital-acetaminophen-caffeine (FIORICET) 50-325-40 MG per tablet Take 1 tablet by mouth every 6 (six) hours as needed.    [provider]  cholecalciferol (VITAMIN D) 1000 UNITS tablet Take 1,000 Units by mouth daily.    [provider]  lansoprazole (PREVACID) 30 MG capsule Take 30 mg by mouth daily.    [provider]  valproic acid (DEPAKENE) 250 MG capsule Take 250 mg by mouth 2 (two) times daily.    [provider]  vitamin E 1000 UNIT capsule Take 1,000 Units by mouth daily.    [provider]    Family History Family History  Problem Relation Age of Onset  . Stroke Father     Social History Social History   Tobacco Use  . Smoking status: Current Every Day Smoker  . Smokeless tobacco: Never Used  Substance Use Topics  . Alcohol use: No  . Drug use: Never     Allergies   Fexofenadine; Furosemide; Gabapentin; Hydrocodone-acetaminophen; Hydroxychloroquine sulfate; Morphine; Nsaids; and Topiramate   Review of Systems Review of Systems  Constitutional: Negative for fever.  HENT: Negative for sore throat.   Eyes: Negative for visual disturbance.  Respiratory: Negative for shortness of breath.   Cardiovascular: Negative for chest pain.  Gastrointestinal:  Negative for abdominal pain.  Genitourinary: Negative for dysuria.  Musculoskeletal: Positive for arthralgias, extremity weakness and myalgias. Negative for neck pain.  Skin: Negative for rash.  Neurological: Positive for numbness and headaches (chronic). Negative for speech difficulty.     Physical Exam Updated Vital Signs BP 138/67 (BP Location: Right Arm)   Pulse 94   Temp (!) 97.4 F (36.3 C) (Oral)   Resp 18   Ht 5' (1.524 m)   Wt 56.2 kg   SpO2 100%   BMI 24.22 kg/m   Physical Exam Vitals signs and nursing note reviewed.  Constitutional:      General: She is not in acute distress.    Appearance: She is well-developed.  HENT:     Head: Normocephalic and atraumatic.  Eyes:     Conjunctiva/sclera: Conjunctivae normal.  Neck:     Musculoskeletal: Neck supple.  Cardiovascular:     Rate and Rhythm: Normal rate and regular rhythm.     Heart sounds: No murmur.  Pulmonary:     Effort: Pulmonary effort is normal. No respiratory distress.     Breath sounds: Normal breath sounds.  Abdominal:     Palpations: Abdomen is soft.     Tenderness: There is no abdominal tenderness.  Musculoskeletal:        General: Tenderness (diffuse muscular tenderness left leg) present. No deformity.  Skin:    General: Skin is warm and dry.     Capillary Refill: Capillary refill takes less than 2 seconds.     Findings: No rash.  Neurological:     Mental Status: She is alert and oriented to person, place, and time.     Sensory: Sensory deficit present.     Motor: Weakness present.     Comments: She is complaining diminished sensation of her entire left leg with numbness in her inner thigh.  Full range of motion of her hip knee and ankle passively.  She is unable to raise her leg off the bed and her plantar and extensor function at her ankle are probably 3+.  Unable to ambulate secondary to pain and weakness of her left leg.      ED Treatments / Results  Labs (all labs ordered are listed,  but only abnormal results are displayed) Labs Reviewed  BASIC METABOLIC PANEL - Abnormal; Notable for the following components:      Result Value   Glucose, Bld 118 (*)    BUN 24 (*)    All other components within normal limits  CBC WITH DIFFERENTIAL/PLATELET - Abnormal; Notable for the following components:   Neutro Abs 7.8 (*)    All other components within normal limits  CK  SEDIMENTATION RATE    EKG None  Radiology Mr Lumbar Spine Wo Contrast  Result Date: 07/26/2018 CLINICAL DATA:  [place] previously with low back pain and left leg pain.  EXAM: MRI LUMBAR SPINE WITHOUT CONTRAST TECHNIQUE: Multiplanar, multisequence MR imaging of the lumbar spine was performed. No intravenous contrast was administered. COMPARISON:  None. FINDINGS: Segmentation:  5 lumbar type vertebral bodies. Alignment: Mild curvature convex to the left. 2 mm retrolisthesis L1-2. 2 mm anterolisthesis L3-4. Vertebrae:  No fracture or primary bone lesion. Conus medullaris and cauda equina: Conus extends to the L1 level. Conus and cauda equina appear normal. Paraspinal and other soft tissues: Negative Disc levels: T12-L1: Mild noncompressive disc bulge. L1-2: 2 mm retrolisthesis. Mild bulging of the disc. No compressive stenosis. L2-3: Mild bulging of the disc.  No compressive stenosis. L3-4: Bilateral facet arthropathy with some edema. 2 mm of anterolisthesis because of this. Bulging of the disc. Stenosis of the lateral recesses left more than right. Some potential for neural compression, particularly on the left. Mild left foraminal to extraforaminal encroachment by bulging disc material could focally irritate the left L3 nerve. L4-5: Endplate osteophytes and bulging of the disc. Bilateral facet and ligamentous hypertrophy. Stenosis of both lateral recesses that could possibly cause neural compression on either or both sides. The facet arthritis could also contribute to back pain. L5-S1: Mild bulging of the disc. Mild facet and  ligamentous hypertrophy. Mild narrowing of the left subarticular lateral recess but without visible compression of the transitioning S1 nerve. IMPRESSION: No evidence of fracture related to a recent fall. L3-4: Bilateral facet arthropathy allowing 2 mm of anterolisthesis. Some edema of the facet joints. Bulging of the disc more towards the left. Narrowing of the lateral recesses left more than right and mild left foraminal to extraforaminal encroachment by bulging disc material. Findings at this level could certainly be associated with back pain. There be some potential for neural compression or irritation particularly in the left lateral recess and left foraminal region. L4-5: Endplate osteophytes and bulging of the disc. Facet degeneration and hypertrophy. Stenosis of both subarticular lateral recesses. Neural compression could occur on either or both sides. The facet arthritis could contribute to back pain. L5-S1: Bulging of the disc. Mild facet hypertrophy on the left. Mild narrowing of the subarticular lateral recess but without visible compression of the transitioning left S1 nerve Electronically Signed   By: Nelson Chimes M.D.   On: 07/26/2018 18:22    Procedures Procedures (including critical care time)  Medications Ordered in ED Medications - No data to display   Initial Impression / Assessment and Plan / ED Course  I have reviewed the triage vital signs and the nursing notes.  Pertinent labs & imaging results that were available during my care of the patient were reviewed by me and considered in my medical decision making (see chart for details).  Clinical Course as of Jul 26 1112  Mon Jul 26, 2018  1835 Reviewed patient's MRI findings with her.  She is comfortable going home and she said she is taken NSAIDs and steroids before.  She has a Firefighter in [place] who does spine so she will follow-up with him.  My interpretation of the MRI does not show any acute neurosurgical operative interventions  and I think she can safely follow-up as an outpatient.   [MB]    Clinical Course User Index [MB] Hayden Rasmussen, MD   ddx - peripheral nerve entrapment, disc herniation, RSD, vascular disease, fibromyalgia      Final Clinical Impressions(s) / ED Diagnoses   Final diagnoses:  Left leg weakness  Sciatica of left side    ED Discharge Orders  None       Hayden Rasmussen, MD 07/27/18 1115

## 2018-07-26 NOTE — ED Triage Notes (Signed)
Patient complaining of fall earlier this month. Complaining of pain to lower back and left leg. States "Thursday my left leg stopped working."

## 2018-07-26 NOTE — Discharge Instructions (Addendum)
You were seen in the emergency department for worsening back and leg pain and leg weakness on the left.  You had an MRI that showed a disc bulge and some other inflammatory changes near the nerve.  This will need follow-up with either an orthopedic or a neurosurgeon who specializes in the spine.  We are prescribing you some prednisone and you should use some anti-inflammatories like ibuprofen.  You can try some ice or heat to your back area.  Please return if any bowel or bladder incontinence or worsening symptoms.

## 2018-07-30 ENCOUNTER — Other Ambulatory Visit: Payer: Self-pay

## 2018-07-30 ENCOUNTER — Ambulatory Visit (HOSPITAL_COMMUNITY)
Admission: RE | Admit: 2018-07-30 | Discharge: 2018-07-30 | Disposition: A | Discharge status: Home / Self Care | Payer: Medicare HMO | Source: Ambulatory Visit | Attending: Nurse Practitioner | Admitting: Nurse Practitioner

## 2018-07-30 ENCOUNTER — Other Ambulatory Visit: Payer: Self-pay | Admitting: Nurse Practitioner

## 2018-07-30 DIAGNOSIS — R29898 Other symptoms and signs involving the musculoskeletal system: Secondary | ICD-10-CM | POA: Insufficient documentation

## 2018-07-30 DIAGNOSIS — M2560 Stiffness of unspecified joint, not elsewhere classified: Secondary | ICD-10-CM

## 2018-07-30 DIAGNOSIS — R32 Unspecified urinary incontinence: Secondary | ICD-10-CM

## 2018-07-30 DIAGNOSIS — M546 Pain in thoracic spine: Secondary | ICD-10-CM | POA: Diagnosis not present

## 2018-07-30 DIAGNOSIS — Z6823 Body mass index (BMI) 23.0-23.9, adult: Secondary | ICD-10-CM | POA: Diagnosis not present

## 2018-07-30 DIAGNOSIS — G9589 Other specified diseases of spinal cord: Secondary | ICD-10-CM | POA: Diagnosis not present

## 2018-07-30 DIAGNOSIS — Z299 Encounter for prophylactic measures, unspecified: Secondary | ICD-10-CM | POA: Diagnosis not present

## 2018-07-30 DIAGNOSIS — M5126 Other intervertebral disc displacement, lumbar region: Secondary | ICD-10-CM | POA: Diagnosis not present

## 2018-07-31 ENCOUNTER — Emergency Department (HOSPITAL_COMMUNITY): Payer: Medicare HMO

## 2018-07-31 ENCOUNTER — Other Ambulatory Visit: Payer: Self-pay

## 2018-07-31 ENCOUNTER — Encounter (HOSPITAL_COMMUNITY): Payer: Self-pay

## 2018-07-31 ENCOUNTER — Inpatient Hospital Stay (HOSPITAL_COMMUNITY)
Admission: EM | Admit: 2018-07-31 | Discharge: 2018-08-03 | DRG: 545 | Disposition: A | Discharge status: Rehabilitation Facility | Payer: Medicare HMO | Attending: Internal Medicine | Admitting: Internal Medicine

## 2018-07-31 DIAGNOSIS — M329 Systemic lupus erythematosus, unspecified: Secondary | ICD-10-CM | POA: Diagnosis not present

## 2018-07-31 DIAGNOSIS — M50323 Other cervical disc degeneration at C6-C7 level: Secondary | ICD-10-CM | POA: Diagnosis not present

## 2018-07-31 DIAGNOSIS — D62 Acute posthemorrhagic anemia: Secondary | ICD-10-CM | POA: Diagnosis not present

## 2018-07-31 DIAGNOSIS — Z79899 Other long term (current) drug therapy: Secondary | ICD-10-CM

## 2018-07-31 DIAGNOSIS — Z79891 Long term (current) use of opiate analgesic: Secondary | ICD-10-CM

## 2018-07-31 DIAGNOSIS — I82402 Acute embolism and thrombosis of unspecified deep veins of left lower extremity: Secondary | ICD-10-CM | POA: Diagnosis not present

## 2018-07-31 DIAGNOSIS — R29898 Other symptoms and signs involving the musculoskeletal system: Secondary | ICD-10-CM

## 2018-07-31 DIAGNOSIS — G054 Myelitis in diseases classified elsewhere: Secondary | ICD-10-CM | POA: Diagnosis present

## 2018-07-31 DIAGNOSIS — F329 Major depressive disorder, single episode, unspecified: Secondary | ICD-10-CM | POA: Diagnosis present

## 2018-07-31 DIAGNOSIS — R531 Weakness: Secondary | ICD-10-CM | POA: Diagnosis not present

## 2018-07-31 DIAGNOSIS — G9389 Other specified disorders of brain: Secondary | ICD-10-CM | POA: Diagnosis present

## 2018-07-31 DIAGNOSIS — G0491 Myelitis, unspecified: Secondary | ICD-10-CM | POA: Diagnosis not present

## 2018-07-31 DIAGNOSIS — G894 Chronic pain syndrome: Secondary | ICD-10-CM | POA: Diagnosis not present

## 2018-07-31 DIAGNOSIS — G8918 Other acute postprocedural pain: Secondary | ICD-10-CM | POA: Diagnosis not present

## 2018-07-31 DIAGNOSIS — Z7982 Long term (current) use of aspirin: Secondary | ICD-10-CM | POA: Diagnosis not present

## 2018-07-31 DIAGNOSIS — R799 Abnormal finding of blood chemistry, unspecified: Secondary | ICD-10-CM | POA: Diagnosis not present

## 2018-07-31 DIAGNOSIS — E46 Unspecified protein-calorie malnutrition: Secondary | ICD-10-CM | POA: Diagnosis not present

## 2018-07-31 DIAGNOSIS — I82462 Acute embolism and thrombosis of left calf muscular vein: Secondary | ICD-10-CM | POA: Diagnosis present

## 2018-07-31 DIAGNOSIS — Z886 Allergy status to analgesic agent status: Secondary | ICD-10-CM

## 2018-07-31 DIAGNOSIS — H9192 Unspecified hearing loss, left ear: Secondary | ICD-10-CM | POA: Diagnosis present

## 2018-07-31 DIAGNOSIS — D72829 Elevated white blood cell count, unspecified: Secondary | ICD-10-CM

## 2018-07-31 DIAGNOSIS — G8194 Hemiplegia, unspecified affecting left nondominant side: Secondary | ICD-10-CM | POA: Diagnosis not present

## 2018-07-31 DIAGNOSIS — G4700 Insomnia, unspecified: Secondary | ICD-10-CM | POA: Diagnosis not present

## 2018-07-31 DIAGNOSIS — Z791 Long term (current) use of non-steroidal anti-inflammatories (NSAID): Secondary | ICD-10-CM | POA: Diagnosis not present

## 2018-07-31 DIAGNOSIS — G43909 Migraine, unspecified, not intractable, without status migrainosus: Secondary | ICD-10-CM | POA: Diagnosis present

## 2018-07-31 DIAGNOSIS — E039 Hypothyroidism, unspecified: Secondary | ICD-10-CM | POA: Diagnosis present

## 2018-07-31 DIAGNOSIS — Z9181 History of falling: Secondary | ICD-10-CM

## 2018-07-31 DIAGNOSIS — K5900 Constipation, unspecified: Secondary | ICD-10-CM | POA: Diagnosis present

## 2018-07-31 DIAGNOSIS — K219 Gastro-esophageal reflux disease without esophagitis: Secondary | ICD-10-CM

## 2018-07-31 DIAGNOSIS — K592 Neurogenic bowel, not elsewhere classified: Secondary | ICD-10-CM | POA: Diagnosis not present

## 2018-07-31 DIAGNOSIS — M797 Fibromyalgia: Secondary | ICD-10-CM | POA: Diagnosis not present

## 2018-07-31 DIAGNOSIS — F419 Anxiety disorder, unspecified: Secondary | ICD-10-CM | POA: Diagnosis present

## 2018-07-31 DIAGNOSIS — Z8619 Personal history of other infectious and parasitic diseases: Secondary | ICD-10-CM | POA: Diagnosis not present

## 2018-07-31 DIAGNOSIS — N319 Neuromuscular dysfunction of bladder, unspecified: Secondary | ICD-10-CM | POA: Diagnosis not present

## 2018-07-31 DIAGNOSIS — I824Z2 Acute embolism and thrombosis of unspecified deep veins of left distal lower extremity: Secondary | ICD-10-CM | POA: Diagnosis not present

## 2018-07-31 DIAGNOSIS — R836 Abnormal cytological findings in cerebrospinal fluid: Secondary | ICD-10-CM | POA: Diagnosis not present

## 2018-07-31 DIAGNOSIS — Z8673 Personal history of transient ischemic attack (TIA), and cerebral infarction without residual deficits: Secondary | ICD-10-CM

## 2018-07-31 DIAGNOSIS — F17200 Nicotine dependence, unspecified, uncomplicated: Secondary | ICD-10-CM | POA: Diagnosis present

## 2018-07-31 DIAGNOSIS — R202 Paresthesia of skin: Secondary | ICD-10-CM | POA: Diagnosis not present

## 2018-07-31 DIAGNOSIS — M5030 Other cervical disc degeneration, unspecified cervical region: Secondary | ICD-10-CM | POA: Diagnosis not present

## 2018-07-31 DIAGNOSIS — Z885 Allergy status to narcotic agent status: Secondary | ICD-10-CM

## 2018-07-31 DIAGNOSIS — Z823 Family history of stroke: Secondary | ICD-10-CM

## 2018-07-31 DIAGNOSIS — M21372 Foot drop, left foot: Secondary | ICD-10-CM | POA: Diagnosis not present

## 2018-07-31 DIAGNOSIS — M7989 Other specified soft tissue disorders: Secondary | ICD-10-CM | POA: Diagnosis not present

## 2018-07-31 DIAGNOSIS — F411 Generalized anxiety disorder: Secondary | ICD-10-CM | POA: Diagnosis not present

## 2018-07-31 DIAGNOSIS — N179 Acute kidney failure, unspecified: Secondary | ICD-10-CM | POA: Diagnosis not present

## 2018-07-31 DIAGNOSIS — Z888 Allergy status to other drugs, medicaments and biological substances status: Secondary | ICD-10-CM

## 2018-07-31 LAB — PROTEIN AND GLUCOSE, CSF
Glucose, CSF: 64 mg/dL (ref 40–70)
Total  Protein, CSF: 62 mg/dL — ABNORMAL HIGH (ref 15–45)

## 2018-07-31 LAB — COMPREHENSIVE METABOLIC PANEL
ALT: 25 U/L (ref 0–44)
AST: 23 U/L (ref 15–41)
Albumin: 3.7 g/dL (ref 3.5–5.0)
Alkaline Phosphatase: 68 U/L (ref 38–126)
Anion gap: 9 (ref 5–15)
BUN: 35 mg/dL — ABNORMAL HIGH (ref 8–23)
CO2: 25 mmol/L (ref 22–32)
Calcium: 9.5 mg/dL (ref 8.9–10.3)
Chloride: 102 mmol/L (ref 98–111)
Creatinine, Ser: 0.97 mg/dL (ref 0.44–1.00)
GFR calc Af Amer: >60 mL/min (ref >60)
GFR calc non Af Amer: >60 mL/min (ref >60)
Glucose, Bld: 121 mg/dL — ABNORMAL HIGH (ref 70–99)
Potassium: 4.3 mmol/L (ref 3.5–5.1)
Sodium: 136 mmol/L (ref 135–145)
Total Bilirubin: 0.3 mg/dL (ref 0.3–1.2)
Total Protein: 7 g/dL (ref 6.5–8.1)

## 2018-07-31 LAB — CSF CELL COUNT WITH DIFFERENTIAL
Eosinophils, CSF: 0 % (ref 0–1)
Lymphs, CSF: 83 % — ABNORMAL HIGH (ref 40–80)
Monocyte-Macrophage-Spinal Fluid: 14 % — ABNORMAL LOW (ref 15–45)
RBC Count, CSF: 203 /mm3 — ABNORMAL HIGH
Segmented Neutrophils-CSF: 3 % (ref 0–6)
Tube #: 1
WBC, CSF: 14 /mm3 (ref 0–5)

## 2018-07-31 LAB — CBC
HCT: 38 % (ref 36.0–46.0)
Hemoglobin: 12.4 g/dL (ref 12.0–15.0)
MCH: 30.8 pg (ref 26.0–34.0)
MCHC: 32.6 g/dL (ref 30.0–36.0)
MCV: 94.3 fL (ref 80.0–100.0)
Platelets: 200 10*3/uL (ref 150–400)
RBC: 4.03 MIL/uL (ref 3.87–5.11)
RDW: 12.7 % (ref 11.5–15.5)
WBC: 12.6 10*3/uL — ABNORMAL HIGH (ref 4.0–10.5)
nRBC: 0 % (ref 0.0–0.2)

## 2018-07-31 LAB — VITAMIN B12: Vitamin B-12: 1400 pg/mL — ABNORMAL HIGH (ref 180–914)

## 2018-07-31 MED ORDER — GADOBUTROL 1 MMOL/ML IV SOLN
5.0000 mL | Freq: Once | INTRAVENOUS | Status: AC | PRN
  Administered 2018-07-31: 5 mL via INTRAVENOUS

## 2018-07-31 MED ORDER — SODIUM CHLORIDE 0.9 % IV SOLN
1000.0000 mg | Freq: Once | INTRAVENOUS | Status: AC
  Administered 2018-07-31: 1000 mg via INTRAVENOUS
  Filled 2018-07-31: qty 8

## 2018-07-31 MED ORDER — ALPRAZOLAM 0.5 MG PO TABS
0.5000 mg | ORAL_TABLET | Freq: Every evening | ORAL | Status: DC | PRN
  Administered 2018-08-01: 0.5 mg via ORAL
  Filled 2018-07-31: qty 1

## 2018-07-31 MED ORDER — ACETAMINOPHEN 650 MG RE SUPP: 650.0000 mg | Freq: Four times a day (QID) | RECTAL | Status: DC | PRN

## 2018-07-31 MED ORDER — SODIUM CHLORIDE 0.9 % IV SOLN
1000.0000 mg | INTRAVENOUS | Status: DC
  Administered 2018-08-01 – 2018-08-02 (×2): 1000 mg via INTRAVENOUS
  Filled 2018-07-31 (×4): qty 8

## 2018-07-31 MED ORDER — PANTOPRAZOLE SODIUM 40 MG PO TBEC
40.0000 mg | DELAYED_RELEASE_TABLET | Freq: Every day | ORAL | Status: DC
  Administered 2018-08-01 (×2): 40 mg via ORAL
  Filled 2018-07-31 (×2): qty 1

## 2018-07-31 MED ORDER — ACETAMINOPHEN 325 MG PO TABS
650.0000 mg | ORAL_TABLET | Freq: Four times a day (QID) | ORAL | Status: DC | PRN
  Administered 2018-08-03: 650 mg via ORAL

## 2018-07-31 MED ORDER — LORAZEPAM 2 MG/ML IJ SOLN
1.0000 mg | Freq: Once | INTRAMUSCULAR | Status: AC
  Administered 2018-07-31: 1 mg via INTRAVENOUS
  Filled 2018-07-31: qty 1

## 2018-07-31 MED ORDER — LIDOCAINE-EPINEPHRINE (PF) 2 %-1:200000 IJ SOLN
20.0000 mL | Freq: Once | INTRAMUSCULAR | Status: AC
  Administered 2018-07-31: 20 mL
  Filled 2018-07-31: qty 20

## 2018-07-31 NOTE — Consult Note (Signed)
Requesting Physician: Dr. Venora Maples    Chief Complaint: Left leg greater than right leg weakness  History obtained from: Patient and Chart   HPI:                                                                                                                                       Susan Hodge is a 68 y.o. female with past medical history of migraines on valproic acid and Topamax, fibromyalgia, SLE, herpes zoster presents the emergency department with progressive weakness in her left leg that she noted after a fall 3 weeks ago.   She states that about 3 weeks ago she had a fall and fell she hurt her hip.  The following day she noticed she had difficulty walking and weakness in her left leg and this persisted for several days until she decided to present to the emergency room.  She also complains of tingling sensation over the lateral aspect of her thigh. She denies any urinary incontinence.  Also states that about 4 to 5 years ago she had weakness in her legs as well and was barely able to walk but improved after her PCP prescribed her Lyrica.  Per patient and her daughter in law ,she has seen Dr. Merlene Laughter, a neurologist in the past (no notes available) for the symptoms. She states she may have had a lumbar puncture in the past, although unclear if this was actually an epidural injection.  He also states that she had visual loss in her right eye when she was diagnosed to have herpes zoster but that is completely improved.  She was initially seen at [facility] emergency room and underwent a MRI of her lumbar spine for back pain lower extremity weakness and discharged from the emergency department after showed only mild bulging disks and some foraminal encroachment.  She received an outpatient MRI T-spine which showed multiple areas of T2 signal changes from the lower cervical region to the upper T11 region and recommended to go to the emergency room.  She presented to [facility] today  and in the meantime her weakness had progressed.   MRI C-spine today shows enhancing lesion at the C3-C4 level and again at the C7 level concerning for acute myelitis/demyelination.  MRI brain shows right frontal encephalomalacia, possibly in chronic infarct that was asymptomatic.      Past Medical History:  Diagnosis Date  . Chest pain   . Depression   . DJD (degenerative joint disease)   . FH: CVA (cerebrovascular accident)    father  . Fibromyalgia   . GERD (gastroesophageal reflux disease)   . Hypothyroidism   . Lupus ([place])   . Migraines   . Palpitations   . Status post cholecystectomy    appendectomy and bilaateral oophorectomy  . Tobacco use     Past Surgical History:  Procedure Laterality Date  . BILATERAL OOPHORECTOMY    .  CARDIAC CATHETERIZATION    . CHOLECYSTECTOMY      Family History  Problem Relation Age of Onset  . Stroke Father    Social History:  reports that she has been smoking. She has never used smokeless tobacco. She reports that she does not drink alcohol or use drugs.  Allergies:  Allergies  Allergen Reactions  . Fexofenadine   . Furosemide   . Gabapentin   . Hydrocodone-Acetaminophen   . Hydroxychloroquine Sulfate   . Morphine   . Nsaids   . Topiramate     Medications:                                                                                                                        I reviewed home medications   ROS:                                                                                                                                     14 systems reviewed and negative except above   Examination:                                                                                                      General: Appears well-developed and well-nourished.  Psych: Affect appropriate to situation Eyes: No scleral injection HENT: No OP obstrucion Head: Normocephalic.  Cardiovascular: Normal rate and regular rhythm.   Respiratory: Effort normal and breath sounds normal to anterior ascultation GI: Soft.  No distension. There is no tenderness.  Skin: WDI    Neurological Examination Mental Status: Alert, oriented, thought content appropriate.  Speech fluent without evidence of aphasia. Able to follow 3 step commands without difficulty. Cranial Nerves: II: Visual fields grossly normal,  III,IV, VI: ptosis not present, extra-ocular motions intact bilaterally, pupils equal, round, reactive to light and accommodation V,VII: smile symmetric, facial light touch sensation normal bilaterally VIII: hearing normal bilaterally IX,X: uvula rises symmetrically XI: bilateral shoulder shrug XII: midline tongue extension Mental Status: Alert, oriented, thought  content appropriate.  Speech fluent without evidence of aphasia.  Able to follow 3 step commands without difficulty. Cranial Nerves: II: visual fields grossly normal, pupils equal, round, reactive to light and accommodation III,IV, VI: ptosis not present, extraocular muscles extra-ocular motions intact bilaterally V,VII: smile symmetric, facial light touch sensation normal bilaterally VIII: hearing normal bilaterally IX,X: gag reflex present XI: trapezius strength/neck flexion strength normal bilaterally XII: tongue strength normal  Motor: Right : Upper extremity    Left:     Upper extremity 5/5 deltoid       5/5 deltoid 5/5 tricep      5/5 tricep 5/5 biceps      5/5 biceps  5/5wrist flexion     5/5 wrist flexion 5/5 wrist extension     5/5 wrist extension 5/5 hand grip      5/5 hand grip  Lower extremity     Lower extremity 4+/5 hip flexor      2/5 hip flexor 5/5 hip adductors     2/5 hip adductors 5/5 hip abductors     2/5 hip abductors 5/5 quadricep      3/5 quadriceps  5/5 hamstrings     2/5 hamstrings 5/5 plantar flexion       2/5 plantar flexion 5/5 dorsiflexion    0/5 Dorsiflexion Tone and bulk:normal tone throughout; no atrophy noted Deep  Tendon Reflexes:  Right: Upper Extremity   Left: Upper extremity   biceps (C-5 to C-6) 3/4   biceps (C-5 to C-6) 4/4 tricep (C7) 3/4    triceps (C7) 4/4 Brachioradialis (C6) 3/4  Brachioradialis (C6) 4/4 Lower Extremity Lower Extremity  quadriceps (L-2 to L-4) 3/4   quadriceps (L-2 to L-4) 4/4 Achilles (S1) 3/4   Achilles (S1) 4/4 Positive hoffmans on left, ankle clonus not appreciated on the left  Sensory: Pinprick and light touch intact throughout, bilaterally Plantars: Right: downgoing   Left: downgoing Cerebellar: normal finger-to-nose, normal rapid alternating movements and normal heel-to-shin test Gait: normal gait and station     Lab Results: Basic Metabolic Panel: Recent Labs  Lab 07/26/18 1632 07/31/18 1704  NA 139 136  K 4.0 4.3  CL 106 102  CO2 25 25  GLUCOSE 118* 121*  BUN 24* 35*  CREATININE 0.81 0.97  CALCIUM 9.5 9.5    CBC: Recent Labs  Lab 07/26/18 1632 07/31/18 1704  WBC 9.3 12.6*  NEUTROABS 7.8*  --   HGB 13.6 12.4  HCT 40.8 38.0  MCV 93.4 94.3  PLT 200 200    Coagulation Studies: No results for input(s): LABPROT, INR in the last 72 hours.  Imaging: Mr Jeri Cos And Wo Contrast  Result Date: 07/31/2018 CLINICAL DATA:  Myelopathy. Left leg weakness and numbness. Urinary incontinence. EXAM: MRI HEAD WITHOUT AND WITH CONTRAST TECHNIQUE: Multiplanar, multiecho pulse sequences of the brain and surrounding structures were obtained without and with intravenous contrast. CONTRAST:  5 mL Gadavist COMPARISON:  None. FINDINGS: Brain: No acute infarct, mass, midline shift, or extra-axial fluid collection is identified. There is a small to moderate-sized chronic right MCA infarct involving the frontal operculum and insula with a small amount of hemosiderin staining. Small foci of T2 hyperintensity scattered elsewhere in the cerebral white matter bilaterally, most notably in the left corona radiata, are at most mildly advanced for age. No abnormal brain  parenchymal or meningeal enhancement is identified. There is mild global cerebral atrophy. Vascular: Major intracranial vascular flow voids are preserved. Skull and upper cervical spine: Unremarkable bone marrow signal. Sinuses/Orbits:  Unremarkable orbits. Mild circumferential mucosal thickening and moderate volume fluid in the left maxillary sinus which appears small. Trace right mastoid effusion. Other: Diffuse nodularity/microcystic changes throughout both parotid glands. IMPRESSION: 1. No acute intracranial abnormality. 2. Chronic right MCA infarct. 3. Cerebral white matter T2 signal changes, nonspecific but compatible with mild chronic small vessel ischemic disease. 4. Diffuse nodularity/microcystic changes in the parotid gland suggestive of chronic inflammation or autoimmune disease. Electronically Signed   By: Logan Bores M.D.   On: 07/31/2018 20:13   Mr Thoracic Spine Wo Contrast  Result Date: 07/30/2018 CLINICAL DATA:  Urinary incontinence. Diminished sensation of the left leg. EXAM: MRI THORACIC SPINE WITHOUT CONTRAST TECHNIQUE: Multiplanar, multisequence MR imaging of the thoracic spine was performed. No intravenous contrast was administered. COMPARISON:  None. FINDINGS: Alignment:  Normal Vertebrae: No fracture or primary bone lesion. Cord: Multifocal abnormal cord signal with increased T2 within the central cord. This is most notable at the lower cervical region, T4 and T5, and T9-T11, but is present to a lesser degree in the intervening cord. There is no cord compression. Paraspinal and other soft tissues: Negative Disc levels: No significant degenerative disease. At T5-6 there is a shallow right posterolateral disc protrusion with its does not compress the neural structures. Minor disc bulges at T9-10 and T11-12. IMPRESSION: Abnormal T2 signal affecting the spinal cord in a patchy fashion from the lower cervical region down to the upper T11 level. Involvement appears to primarily affect the  central cord, without lateralization. The differential diagnosis is not specific. This does not appear to be focal syringomyelia. Multifocal intramedullary tumor, ischemic change of the spinal cord, demyelinating disease and vitamin-B deficiency are all theoretically possible. Electronically Signed   By: Nelson Chimes M.D.   On: 07/30/2018 17:31   Mr Cervical Spine W Or Wo Contrast  Result Date: 07/31/2018 CLINICAL DATA:  Myelopathy. Pain after a fall earlier in the month. Left leg weakness and numbness. Urinary incontinence. History of lupus. EXAM: MRI CERVICAL SPINE WITHOUT AND WITH CONTRAST TECHNIQUE: Multiplanar and multiecho pulse sequences of the cervical spine, to include the craniocervical junction and cervicothoracic junction, were obtained without and with intravenous contrast. CONTRAST:  5 mL Gadavist COMPARISON:  Thoracic spine MRI 07/30/2018 FINDINGS: Intermittent moderate motion artifact on axial sequences. Alignment: Cervical spine straightening.  No listhesis. Vertebrae: No fracture, suspicious osseous lesion, or significant marrow edema. Cord: Multifocal T2 hyperintensity in the cervical spinal cord including a 12 mm enhancing lesion in the left hemicord at C3-4 and a nearly 2 cm minimally enhancing lesion in the right and central aspects of the cord at C7. Lower level nonenhancing T2 hyperintensity elsewhere in the cord including in the upper thoracic spine and on the right at C2. Posterior Fossa, vertebral arteries, paraspinal tissues: Microcystic changes in the parotid glands as reported on brain MRI. Preserved vertebral artery flow voids. Disc levels: C2-3: Negative. C3-4: Negative. C4-5: Mild disc space narrowing. Mild disc bulging and minimal uncovertebral spurring without significant stenosis. C5-6: Mild disc space narrowing. Mild disc bulging and uncovertebral spurring without significant stenosis. C6-7: Moderate disc space narrowing. Disc bulging, a small left posterolateral disc  protrusion, and uncovertebral spurring result in borderline to mild right and mild-to-moderate left neural foraminal stenosis without significant spinal stenosis. C7-T1: Shallow right paracentral disc protrusion without stenosis. IMPRESSION: 1. Multifocal T2 signal abnormality in the cervical spinal cord including enhancing lesions at C3-4 and C7. The appearance is nonspecific, however lupus myelitis, other inflammatory or infectious myelitis, and demyelinating disease  are considerations. Tumor and ischemia are felt to be unlikely. 2. Cervical disc degeneration greatest at C6-7 where there is mild-to-moderate left neural foraminal stenosis. No significant spinal stenosis. Electronically Signed   By: Logan Bores M.D.   On: 07/31/2018 20:52     I have reviewed the above imaging : Multiple longitudinal lesions noted T2 sequence on MRI C-spine and T-spine.  Some of these lesions are contrast-enhancing most notably at C3-C4 region.   ASSESSMENT AND PLAN  Patient is a 68 year old female past medical history of migraines on valproic acid and Topamax, fibromyalgia, SLE, herpes zoster presents the emergency department with progressive weakness in her left leg that she noted after a fall 3 weeks ago.   Myelitis at C3-4 and C7 (enhancing) with multiple areas of longitudinal signal changes suggestive of prior myelitis/demyelination  Prior history of an unexplained lower extremity weakness that improved with Lyrica  - D/D includes neuromyelitis optica, VZV myelitis, lupus myelitis   Plan Lumbar Puncture -need to check for protein, glucose, cell count, VZV PCR, HSV PCR and aquaporin 4 antibodies, IV solumedrol 1g daily x 5days Start patient on Protonix and Calcium supplements PT/OT        [facility] Pager Number [phone]

## 2018-07-31 NOTE — ED Notes (Signed)
Patient transported to MRI 

## 2018-07-31 NOTE — ED Notes (Signed)
Neuro Provider at bedside. °

## 2018-07-31 NOTE — ED Notes (Signed)
ED TO INPATIENT HANDOFF REPORT  ED Nurse Name and Phone #: Kelby Fam [phone]  S Name/Age/Gender Susan Hodge 68 y.o. female Room/Bed: 015C/015C  Code Status   Code Status: Full Code  Home/SNF/Other Home Patient oriented to: self, place, time and situation Is this baseline? Yes   Triage Complete: Triage complete  Chief Complaint numbness in legs  Triage Note Pt here for pain after a fall earlier in the month.  Pain in the left left and lower back.  A&Ox4. Hx of fibra myalgia, lupus, arthritis.    Allergies Allergies  Allergen Reactions  . Fexofenadine   . Furosemide   . Gabapentin   . Hydrocodone-Acetaminophen   . Hydroxychloroquine Sulfate   . Morphine   . Nsaids   . Topiramate     Level of Care/Admitting Diagnosis ED Disposition    ED Disposition Condition [facility]: [place] [[id number]]  Level of Care: Med-Surg [16]  Diagnosis: Neuromyelitis optica (devic) [facility]) [[id number]]  Admitting Physician: Shela Leff [[id number]]  Attending Physician: Shela Leff [[id number]]  Estimated length of stay: past midnight tomorrow  Certification:: I certify this patient will need inpatient services for at least 2 midnights  PT Class (Do Not Modify): Inpatient [101]  PT Acc Code (Do Not Modify): Private [1]       B Medical/Surgery History Past Medical History:  Diagnosis Date  . Chest pain   . Depression   . DJD (degenerative joint disease)   . FH: CVA (cerebrovascular accident)    father  . Fibromyalgia   . GERD (gastroesophageal reflux disease)   . Hypothyroidism   . Lupus ([place])   . Migraines   . Palpitations   . Status post cholecystectomy    appendectomy and bilaateral oophorectomy  . Tobacco use    Past Surgical History:  Procedure Laterality Date  . BILATERAL OOPHORECTOMY    . CARDIAC CATHETERIZATION    . CHOLECYSTECTOMY       A IV Location/Drains/Wounds Patient Lines/Drains/Airways Status    Active Line/Drains/Airways    Name:   Placement date:   Placement time:   Site:   Days:   Peripheral IV 07/31/18 Right Antecubital   07/31/18    1706    Antecubital   less than 1          Intake/Output Last 24 hours No intake or output data in the 24 hours ending 07/31/18 2151  Labs/Imaging Results for orders placed or performed during the hospital encounter of 07/31/18 (from the past 48 hour(s))  CBC     Status: Abnormal   Collection Time: 07/31/18  5:04 PM  Result Value Ref Range   WBC 12.6 (H) 4.0 - 10.5 K/uL   RBC 4.03 3.87 - 5.11 MIL/uL   Hemoglobin 12.4 12.0 - 15.0 g/dL   HCT 38.0 36.0 - 46.0 %   MCV 94.3 80.0 - 100.0 fL   MCH 30.8 26.0 - 34.0 pg   MCHC 32.6 30.0 - 36.0 g/dL   RDW 12.7 11.5 - 15.5 %   Platelets 200 150 - 400 K/uL   nRBC 0.0 0.0 - 0.2 %    Comment: Performed at [facility], [place] [address] [place], [place] [address]  Comprehensive metabolic panel     Status: Abnormal   Collection Time: 07/31/18  5:04 PM  Result Value Ref Range   Sodium 136 135 - 145 mmol/L   Potassium 4.3 3.5 - 5.1 mmol/L   Chloride 102  98 - 111 mmol/L   CO2 25 22 - 32 mmol/L   Glucose, Bld 121 (H) 70 - 99 mg/dL   BUN 35 (H) 8 - 23 mg/dL   Creatinine, Ser 0.97 0.44 - 1.00 mg/dL   Calcium 9.5 8.9 - 10.3 mg/dL   Total Protein 7.0 6.5 - 8.1 g/dL   Albumin 3.7 3.5 - 5.0 g/dL   AST 23 15 - 41 U/L   ALT 25 0 - 44 U/L   Alkaline Phosphatase 68 38 - 126 U/L   Total Bilirubin 0.3 0.3 - 1.2 mg/dL   GFR calc non Af Amer >60 >60 mL/min   GFR calc Af Amer >60 >60 mL/min   Anion gap 9 5 - 15    Comment: Performed at [facility] [address] [place], [place] [address]  Vitamin B12     Status: Abnormal   Collection Time: 07/31/18  5:23 PM  Result Value Ref Range   Vitamin B-12 1,400 (H) 180 - 914 pg/mL    Comment: (NOTE) This assay is not validated for testing neonatal or myeloproliferative syndrome specimens for Vitamin B12 levels. Performed at [facility],  [place] [address], [place], [place] [address]    Mr Jeri Cos And Wo Contrast  Result Date: 07/31/2018 CLINICAL DATA:  Myelopathy. Left leg weakness and numbness. Urinary incontinence. EXAM: MRI HEAD WITHOUT AND WITH CONTRAST TECHNIQUE: Multiplanar, multiecho pulse sequences of the brain and surrounding structures were obtained without and with intravenous contrast. CONTRAST:  5 mL Gadavist COMPARISON:  None. FINDINGS: Brain: No acute infarct, mass, midline shift, or extra-axial fluid collection is identified. There is a small to moderate-sized chronic right MCA infarct involving the frontal operculum and insula with a small amount of hemosiderin staining. Small foci of T2 hyperintensity scattered elsewhere in the cerebral white matter bilaterally, most notably in the left corona radiata, are at most mildly advanced for age. No abnormal brain parenchymal or meningeal enhancement is identified. There is mild global cerebral atrophy. Vascular: Major intracranial vascular flow voids are preserved. Skull and upper cervical spine: Unremarkable bone marrow signal. Sinuses/Orbits: Unremarkable orbits. Mild circumferential mucosal thickening and moderate volume fluid in the left maxillary sinus which appears small. Trace right mastoid effusion. Other: Diffuse nodularity/microcystic changes throughout both parotid glands. IMPRESSION: 1. No acute intracranial abnormality. 2. Chronic right MCA infarct. 3. Cerebral white matter T2 signal changes, nonspecific but compatible with mild chronic small vessel ischemic disease. 4. Diffuse nodularity/microcystic changes in the parotid gland suggestive of chronic inflammation or autoimmune disease. Electronically Signed   By: Logan Bores M.D.   On: 07/31/2018 20:13   Mr Thoracic Spine Wo Contrast  Result Date: 07/30/2018 CLINICAL DATA:  Urinary incontinence. Diminished sensation of the left leg. EXAM: MRI THORACIC SPINE WITHOUT CONTRAST TECHNIQUE: Multiplanar, multisequence MR imaging  of the thoracic spine was performed. No intravenous contrast was administered. COMPARISON:  None. FINDINGS: Alignment:  Normal Vertebrae: No fracture or primary bone lesion. Cord: Multifocal abnormal cord signal with increased T2 within the central cord. This is most notable at the lower cervical region, T4 and T5, and T9-T11, but is present to a lesser degree in the intervening cord. There is no cord compression. Paraspinal and other soft tissues: Negative Disc levels: No significant degenerative disease. At T5-6 there is a shallow right posterolateral disc protrusion with its does not compress the neural structures. Minor disc bulges at T9-10 and T11-12. IMPRESSION: Abnormal T2 signal affecting the spinal cord in a patchy  fashion from the lower cervical region down to the upper T11 level. Involvement appears to primarily affect the central cord, without lateralization. The differential diagnosis is not specific. This does not appear to be focal syringomyelia. Multifocal intramedullary tumor, ischemic change of the spinal cord, demyelinating disease and vitamin-B deficiency are all theoretically possible. Electronically Signed   By: Nelson Chimes M.D.   On: 07/30/2018 17:31   Mr Cervical Spine W Or Wo Contrast  Result Date: 07/31/2018 CLINICAL DATA:  Myelopathy. Pain after a fall earlier in the month. Left leg weakness and numbness. Urinary incontinence. History of lupus. EXAM: MRI CERVICAL SPINE WITHOUT AND WITH CONTRAST TECHNIQUE: Multiplanar and multiecho pulse sequences of the cervical spine, to include the craniocervical junction and cervicothoracic junction, were obtained without and with intravenous contrast. CONTRAST:  5 mL Gadavist COMPARISON:  Thoracic spine MRI 07/30/2018 FINDINGS: Intermittent moderate motion artifact on axial sequences. Alignment: Cervical spine straightening.  No listhesis. Vertebrae: No fracture, suspicious osseous lesion, or significant marrow edema. Cord: Multifocal T2  hyperintensity in the cervical spinal cord including a 12 mm enhancing lesion in the left hemicord at C3-4 and a nearly 2 cm minimally enhancing lesion in the right and central aspects of the cord at C7. Lower level nonenhancing T2 hyperintensity elsewhere in the cord including in the upper thoracic spine and on the right at C2. Posterior Fossa, vertebral arteries, paraspinal tissues: Microcystic changes in the parotid glands as reported on brain MRI. Preserved vertebral artery flow voids. Disc levels: C2-3: Negative. C3-4: Negative. C4-5: Mild disc space narrowing. Mild disc bulging and minimal uncovertebral spurring without significant stenosis. C5-6: Mild disc space narrowing. Mild disc bulging and uncovertebral spurring without significant stenosis. C6-7: Moderate disc space narrowing. Disc bulging, a small left posterolateral disc protrusion, and uncovertebral spurring result in borderline to mild right and mild-to-moderate left neural foraminal stenosis without significant spinal stenosis. C7-T1: Shallow right paracentral disc protrusion without stenosis. IMPRESSION: 1. Multifocal T2 signal abnormality in the cervical spinal cord including enhancing lesions at C3-4 and C7. The appearance is nonspecific, however lupus myelitis, other inflammatory or infectious myelitis, and demyelinating disease are considerations. Tumor and ischemia are felt to be unlikely. 2. Cervical disc degeneration greatest at C6-7 where there is mild-to-moderate left neural foraminal stenosis. No significant spinal stenosis. Electronically Signed   By: Logan Bores M.D.   On: 07/31/2018 20:52    Pending Labs Unresulted Labs (From admission, onward)    Start     Ordered   08/01/18 0500  CBC  Tomorrow morning,   R     07/31/18 2143   07/31/18 2141  HIV antibody (Routine Testing)  Once,   R     07/31/18 2143   07/31/18 2125  HSV 1/2 Ab IgG/IgM CSF  Once,   R     07/31/18 2124   07/31/18 2124  Miscellaneous [facility] test  (send-out)  Once,   R    Question:  Test name / description:  Answer:  VZV PCR   07/31/18 2123   07/31/18 2123  CSF culture with Stat gram stain  Once,   R    Question:  Are there also cytology or pathology orders on this specimen?  Answer:  Yes   07/31/18 2123   07/31/18 2122  CSF cell count with differential  Once,   R    Question:  Are there also cytology or pathology orders on this specimen?  Answer:  Yes   07/31/18 2123   07/31/18 2122  CSF IgG  Once,   R     07/31/18 2123   07/31/18 2121  Miscellaneous [facility] test (send-out)  Once,   R    Question:  Test name / description:  Answer:  Aquaporin 4 antibody   07/31/18 2123   07/31/18 2121  Protein and glucose, CSF  Once,   R     07/31/18 2123          Vitals/Pain Today's Vitals   07/31/18 1645 07/31/18 1800 07/31/18 2015 07/31/18 2039  BP: (!) 95/46 107/86 (!) 108/51 (!) 98/51  Pulse: 69 76 69 (!) 58  Resp:    16  Temp:      TempSrc:      SpO2: 100% 99% 99% 100%  PainSc:        Isolation Precautions No active isolations  Medications Medications  methylPREDNISolone sodium succinate (SOLU-MEDROL) 1,000 mg in sodium chloride 0.9 % 50 mL IVPB (has no administration in time range)  lidocaine-EPINEPHrine (XYLOCAINE W/EPI) 2 %-1:200000 (PF) injection 20 mL (has no administration in time range)  methylPREDNISolone sodium succinate (SOLU-MEDROL) 1,000 mg in sodium chloride 0.9 % 50 mL IVPB (has no administration in time range)  acetaminophen (TYLENOL) tablet 650 mg (has no administration in time range)    Or  acetaminophen (TYLENOL) suppository 650 mg (has no administration in time range)  pantoprazole (PROTONIX) EC tablet 40 mg (has no administration in time range)  LORazepam (ATIVAN) injection 1 mg (1 mg Intravenous Given 07/31/18 1758)  gadobutrol (GADAVIST) 1 MMOL/ML injection 5 mL (5 mLs Intravenous Contrast Given 07/31/18 1936)    Mobility non-ambulatory Moderate fall risk   Focused Assessments Neuro Assessment  Handoff:  Swallow screen pass? n/a         Neuro Assessment: Exceptions to WDL Neuro Checks:      Last Documented NIHSS Modified Score:   Has TPA been given? No If patient is a Neuro Trauma and patient is going to OR before floor call report to [place] nurse: [phone] or [phone]     R Recommendations: See Admitting Provider Note  Report given to:   Additional Notes:  Pt has long hx of these problems, seems to be progressing, worsening MRI

## 2018-07-31 NOTE — Progress Notes (Signed)
Received critical lab value from LP procedure; elevated wbc of 14; on call provider notified.

## 2018-07-31 NOTE — ED Provider Notes (Signed)
[facility] EMERGENCY DEPARTMENT Provider Note   CSN: [id number] Arrival date & time: 07/31/18  1552    History   Chief Complaint Chief Complaint  Patient presents with   Back Pain   Hip Pain    HPI Susan Hodge is a 68 y.o. female.     HPI Patient is a 68 year old female with progressive left lower extremity weakness and difficulty walking secondary to the new left lower extremity weakness.  She has now developed new paresthesias over the past 24 hours in her right lower extremity and she is concerned about her ability to walk and is afraid she is losing strength in her bilateral lower legs.  She already feels like she needs to drag her left leg.  She denies headache.  No chest pain.  Denies abdominal pain.  Patient was seen evaluated in the emergency department on July 26, 2018.  Outpatient thoracic and lumbar MRI was performed.  MRI thoracic spine with concerning T2 central cord signal abnormalities with broad differential.  Patient is scheduled to see neurosurgery and neurology as an outpatient but because of her new progressive symptoms in her right lower extremity she was concerned and came to the ER for further evaluation.   Past Medical History:  Diagnosis Date   Chest pain    Depression    DJD (degenerative joint disease)    FH: CVA (cerebrovascular accident)    father   Fibromyalgia    GERD (gastroesophageal reflux disease)    Hypothyroidism    Lupus ([place])    Migraines    Palpitations    Status post cholecystectomy    appendectomy and bilaateral oophorectomy   Tobacco use     Patient Active Problem List   Diagnosis Date Noted   Myelitis ([place]) 07/31/2018   ANXIETY DISORDER, GENERALIZED 07/23/2009   UNSPECIFIED HYPOTHYROIDISM 06/21/2009   NONDEPENDENT TOBACCO USE DISORDER 06/21/2009   DEPRESSIVE DISORDER NOT ELSEWHERE CLASSIFIED 06/21/2009   INTERMEDIATE CORONARY SYNDROME 06/21/2009   LUPUS NEPHRITIS 06/21/2009    UNSPECIFIED MYALGIA AND MYOSITIS 06/21/2009   PRECORDIAL PAIN 06/21/2009    Past Surgical History:  Procedure Laterality Date   BILATERAL OOPHORECTOMY     CARDIAC CATHETERIZATION     CHOLECYSTECTOMY       OB History   No obstetric history on file.      Home Medications    Prior to Admission medications   Medication Sig Start Date End Date Taking? Authorizing Provider  ALPRAZolam Duanne Moron) 0.5 MG tablet Take 0.5 mg by mouth at bedtime as needed for anxiety or sleep.     [provider]  aspirin 81 MG chewable tablet Chew 81 mg by mouth daily.    [provider]  butalbital-acetaminophen-caffeine (FIORICET) 50-325-40 MG per tablet Take 1 tablet by mouth every 6 (six) hours as needed.    [provider]  cholecalciferol (VITAMIN D) 1000 UNITS tablet Take 1,000 Units by mouth daily.    [provider]  diclofenac (VOLTAREN) 75 MG EC tablet Take 75 mg by mouth 2 (two) times daily as needed for mild pain.  06/24/18   [provider]  hydrOXYzine (ATARAX/VISTARIL) 10 MG tablet Take 10 mg by mouth every 8 (eight) hours as needed. For itching 06/22/18   [provider]  ondansetron (ZOFRAN) 4 MG tablet Take 4 mg by mouth every 8 (eight) hours as needed. for nausea 06/22/18   [provider]  oxyCODONE-acetaminophen (PERCOCET) 7.5-325 MG tablet Take 1 tablet by mouth every  8 (eight) hours as needed. 07/17/18   [provider]  pantoprazole (PROTONIX) 40 MG tablet Take 40 mg by mouth 2 (two) times daily. 05/26/18   [provider]  predniSONE (STERAPRED UNI-PAK 21 TAB) 10 MG (21) TBPK tablet Take by mouth daily. Take 6 tabs by mouth daily  for 2 days, then 5 tabs for 2 days, then 4 tabs for 2 days...continue to taper 07/26/18   Hayden Rasmussen, MD  pregabalin (LYRICA) 50 MG capsule Take 50 mg by mouth 3 (three) times daily. 06/22/18   [provider]  topiramate (TOPAMAX) 25 MG tablet Take 25-50 mg by mouth at  bedtime. Take 25-50mg  at bedtime as tolerated 07/08/18   [provider]  valACYclovir (VALTREX) 1000 MG tablet Take 1,000 mg by mouth 2 (two) times daily. 04/17/18   [provider]  valproic acid (DEPAKENE) 250 MG capsule Take 250 mg by mouth 2 (two) times daily.    [provider]  Vitamin D, Ergocalciferol, (DRISDOL) 1.25 MG (50000 UT) CAPS capsule Take 1 capsule by mouth every 7 (seven) days.    [provider]  vitamin E 1000 UNIT capsule Take 1,000 Units by mouth daily.    [provider]    Family History Family History  Problem Relation Age of Onset   Stroke Father     Social History Social History   Tobacco Use   Smoking status: Current Every Day Smoker   Smokeless tobacco: Never Used  Substance Use Topics   Alcohol use: No   Drug use: Never     Allergies   Fexofenadine; Furosemide; Gabapentin; Hydrocodone-acetaminophen; Hydroxychloroquine sulfate; Morphine; Nsaids; and Topiramate   Review of Systems Review of Systems  All other systems reviewed and are negative.    Physical Exam Updated Vital Signs BP (!) 98/51    Pulse (!) 58    Temp 98.1 F (36.7 C) (Oral)    Resp 16    SpO2 100%   Physical Exam Vitals signs and nursing note reviewed.  Constitutional:      General: She is not in acute distress.    Appearance: She is well-developed.  HENT:     Head: Normocephalic and atraumatic.  Neck:     Musculoskeletal: Normal range of motion.  Cardiovascular:     Rate and Rhythm: Normal rate and regular rhythm.     Heart sounds: Normal heart sounds.  Pulmonary:     Effort: Pulmonary effort is normal.     Breath sounds: Normal breath sounds.  Abdominal:     General: There is no distension.     Palpations: Abdomen is soft.     Tenderness: There is no abdominal tenderness.  Musculoskeletal:     Comments: Hyperreflexic bilateral patellar reflexes.  Unable to flex at the left hip.  Able to extend and flex at the  left knee.  Able to plantar flex the left foot.  Normal PT and DP pulses bilaterally.  5 out of 5 strength in right lower extremity major muscle groups.  5 out of 5 strength in bilateral upper limb extremity major muscle groups.  Skin:    General: Skin is warm and dry.  Neurological:     Mental Status: She is alert and oriented to person, place, and time.     Comments: Face symmetric.  Speech normal.  Psychiatric:        Judgment: Judgment normal.      ED Treatments / Results  Labs (all labs ordered are  listed, but only abnormal results are displayed) Labs Reviewed  CBC - Abnormal; Notable for the following components:      Result Value   WBC 12.6 (*)    All other components within normal limits  COMPREHENSIVE METABOLIC PANEL - Abnormal; Notable for the following components:   Glucose, Bld 121 (*)    BUN 35 (*)    All other components within normal limits  VITAMIN B12 - Abnormal; Notable for the following components:   Vitamin B-12 1,400 (*)    All other components within normal limits  CSF CULTURE  HSV 1/2 AB IGG/IGM CSF  MISC [facility] TEST (SEND OUT)  PROTEIN AND GLUCOSE, CSF  CSF CELL COUNT WITH DIFFERENTIAL  CSF IGG  HIV ANTIBODY (ROUTINE TESTING W REFLEX)  CBC  MISC [facility] TEST (SEND OUT)  CYTOLOGY - NON PAP    EKG None  Radiology Mr Jeri Cos And Wo Contrast  Result Date: 07/31/2018 CLINICAL DATA:  Myelopathy. Left leg weakness and numbness. Urinary incontinence. EXAM: MRI HEAD WITHOUT AND WITH CONTRAST TECHNIQUE: Multiplanar, multiecho pulse sequences of the brain and surrounding structures were obtained without and with intravenous contrast. CONTRAST:  5 mL Gadavist COMPARISON:  None. FINDINGS: Brain: No acute infarct, mass, midline shift, or extra-axial fluid collection is identified. There is a small to moderate-sized chronic right MCA infarct involving the frontal operculum and insula with a small amount of hemosiderin staining. Small foci of T2 hyperintensity  scattered elsewhere in the cerebral white matter bilaterally, most notably in the left corona radiata, are at most mildly advanced for age. No abnormal brain parenchymal or meningeal enhancement is identified. There is mild global cerebral atrophy. Vascular: Major intracranial vascular flow voids are preserved. Skull and upper cervical spine: Unremarkable bone marrow signal. Sinuses/Orbits: Unremarkable orbits. Mild circumferential mucosal thickening and moderate volume fluid in the left maxillary sinus which appears small. Trace right mastoid effusion. Other: Diffuse nodularity/microcystic changes throughout both parotid glands. IMPRESSION: 1. No acute intracranial abnormality. 2. Chronic right MCA infarct. 3. Cerebral white matter T2 signal changes, nonspecific but compatible with mild chronic small vessel ischemic disease. 4. Diffuse nodularity/microcystic changes in the parotid gland suggestive of chronic inflammation or autoimmune disease. Electronically Signed   By: Logan Bores M.D.   On: 07/31/2018 20:13   Mr Thoracic Spine Wo Contrast  Result Date: 07/30/2018 CLINICAL DATA:  Urinary incontinence. Diminished sensation of the left leg. EXAM: MRI THORACIC SPINE WITHOUT CONTRAST TECHNIQUE: Multiplanar, multisequence MR imaging of the thoracic spine was performed. No intravenous contrast was administered. COMPARISON:  None. FINDINGS: Alignment:  Normal Vertebrae: No fracture or primary bone lesion. Cord: Multifocal abnormal cord signal with increased T2 within the central cord. This is most notable at the lower cervical region, T4 and T5, and T9-T11, but is present to a lesser degree in the intervening cord. There is no cord compression. Paraspinal and other soft tissues: Negative Disc levels: No significant degenerative disease. At T5-6 there is a shallow right posterolateral disc protrusion with its does not compress the neural structures. Minor disc bulges at T9-10 and T11-12. IMPRESSION: Abnormal T2  signal affecting the spinal cord in a patchy fashion from the lower cervical region down to the upper T11 level. Involvement appears to primarily affect the central cord, without lateralization. The differential diagnosis is not specific. This does not appear to be focal syringomyelia. Multifocal intramedullary tumor, ischemic change of the spinal cord, demyelinating disease and vitamin-B deficiency are all theoretically possible. Electronically Signed   By: Elta Guadeloupe  Shogry M.D.   On: 07/30/2018 17:31   Mr Cervical Spine W Or Wo Contrast  Result Date: 07/31/2018 CLINICAL DATA:  Myelopathy. Pain after a fall earlier in the month. Left leg weakness and numbness. Urinary incontinence. History of lupus. EXAM: MRI CERVICAL SPINE WITHOUT AND WITH CONTRAST TECHNIQUE: Multiplanar and multiecho pulse sequences of the cervical spine, to include the craniocervical junction and cervicothoracic junction, were obtained without and with intravenous contrast. CONTRAST:  5 mL Gadavist COMPARISON:  Thoracic spine MRI 07/30/2018 FINDINGS: Intermittent moderate motion artifact on axial sequences. Alignment: Cervical spine straightening.  No listhesis. Vertebrae: No fracture, suspicious osseous lesion, or significant marrow edema. Cord: Multifocal T2 hyperintensity in the cervical spinal cord including a 12 mm enhancing lesion in the left hemicord at C3-4 and a nearly 2 cm minimally enhancing lesion in the right and central aspects of the cord at C7. Lower level nonenhancing T2 hyperintensity elsewhere in the cord including in the upper thoracic spine and on the right at C2. Posterior Fossa, vertebral arteries, paraspinal tissues: Microcystic changes in the parotid glands as reported on brain MRI. Preserved vertebral artery flow voids. Disc levels: C2-3: Negative. C3-4: Negative. C4-5: Mild disc space narrowing. Mild disc bulging and minimal uncovertebral spurring without significant stenosis. C5-6: Mild disc space narrowing. Mild disc  bulging and uncovertebral spurring without significant stenosis. C6-7: Moderate disc space narrowing. Disc bulging, a small left posterolateral disc protrusion, and uncovertebral spurring result in borderline to mild right and mild-to-moderate left neural foraminal stenosis without significant spinal stenosis. C7-T1: Shallow right paracentral disc protrusion without stenosis. IMPRESSION: 1. Multifocal T2 signal abnormality in the cervical spinal cord including enhancing lesions at C3-4 and C7. The appearance is nonspecific, however lupus myelitis, other inflammatory or infectious myelitis, and demyelinating disease are considerations. Tumor and ischemia are felt to be unlikely. 2. Cervical disc degeneration greatest at C6-7 where there is mild-to-moderate left neural foraminal stenosis. No significant spinal stenosis. Electronically Signed   By: Logan Bores M.D.   On: 07/31/2018 20:52    Procedures .Critical Care Performed by: Jola Schmidt, MD Authorized by: Jola Schmidt, MD   Critical care provider statement:    Critical care time (minutes):  32   Critical care was time spent personally by me on the following activities:  Discussions with consultants, evaluation of patient's response to treatment, examination of patient, ordering and performing treatments and interventions, ordering and review of laboratory studies, ordering and review of radiographic studies, pulse oximetry, re-evaluation of patient's condition, obtaining history from patient or surrogate and review of old charts .Lumbar Puncture Performed by: Jola Schmidt, MD Authorized by: Jola Schmidt, MD   Consent:    Consent obtained:  Verbal and written   Consent given by:  Patient   Risks discussed:  Bleeding, headache, infection, pain, repeat procedure and nerve damage Universal protocol:    Patient identity confirmed:  Verbally with patient Pre-procedure details:    Procedure purpose:  Diagnostic   Preparation: Patient was  prepped and draped in usual sterile fashion   Procedure details:    Lumbar space:  L3-L4 interspace   Needle gauge:  20   Number of attempts:  5 or more   Fluid appearance:  Clear   Tubes of fluid:  4   Total volume (ml):  4 Post-procedure:    Puncture site:  Adhesive bandage applied   Patient tolerance of procedure:  Tolerated well, no immediate complications   (including critical care time)  Medications Ordered in ED Medications  methylPREDNISolone sodium succinate (SOLU-MEDROL) 1,000 mg in sodium chloride 0.9 % 50 mL IVPB (1,000 mg Intravenous New Bag/Given 07/31/18 2207)  methylPREDNISolone sodium succinate (SOLU-MEDROL) 1,000 mg in sodium chloride 0.9 % 50 mL IVPB (has no administration in time range)  acetaminophen (TYLENOL) tablet 650 mg (has no administration in time range)    Or  acetaminophen (TYLENOL) suppository 650 mg (has no administration in time range)  pantoprazole (PROTONIX) EC tablet 40 mg (has no administration in time range)  LORazepam (ATIVAN) injection 1 mg (1 mg Intravenous Given 07/31/18 1758)  gadobutrol (GADAVIST) 1 MMOL/ML injection 5 mL (5 mLs Intravenous Contrast Given 07/31/18 1936)  lidocaine-EPINEPHrine (XYLOCAINE W/EPI) 2 %-1:200000 (PF) injection 20 mL (20 mLs Infiltration Given by Other 07/31/18 2159)     Initial Impression / Assessment and Plan / ED Course  I have reviewed the triage vital signs and the nursing notes.  Pertinent labs & imaging results that were available during my care of the patient were reviewed by me and considered in my medical decision making (see chart for details).        Patient seen and evaluated by neurology as well.  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  MRI brain and MRI cervical spine performed.  Patient will be admitted to the hospitalist service.  High-dose IV steroids.  Thousand milligrams of IV Solu-Medrol given here in the emergency department.  Neurology at this time is  requesting lumbar puncture for additional CSF testing.  I was able to perform the lumbar puncture.  Patient tolerated the procedure well.  Patient will be admitted to the hospitalist service.  Neurohospitalist: Dr Lorraine Lax MD Hospitalist: Dr Marlowe Sax MD  Patients family updated Radiology Films Personally Reviewed: Personally viewed MR thoracic and lumbar spine from outpatient imaging  Collateral Information:  Old Records: Prior ED records reviewed including evaluation by Dr. Melina Copa and MRI lumbar spine and thoracic spine  Family: Family updated  Lab Data Interpretation: B12 normal     Final Clinical Impressions(s) / ED Diagnoses   Final diagnoses:  Weakness of left lower extremity    ED Discharge Orders    None       Jola Schmidt, MD 07/31/18 2240

## 2018-07-31 NOTE — ED Notes (Signed)
ED Provider at bedside. 

## 2018-07-31 NOTE — ED Notes (Signed)
Pt returned to room  

## 2018-07-31 NOTE — H&P (Addendum)
History and Physical    Susan Hodge YWV:[id number] DOB: 1950/05/27 DOA: 07/31/2018  PCP: [name], NP Patient coming from: Home  Chief Complaint: Lower extremity weakness  HPI: Susan Hodge is a 68 y.o. female with medical history significant of GERD, hypothyroidism, migraines, fibromyalgia, herpes zoster, lupus presenting to the hospital for evaluation of lower extremity weakness.  Patient states she had a fall over 2 weeks ago and fell on her buttocks.  She did not injure her head during the fall.  She was doing okay but then earlier this week she experienced weakness in her left lower extremity.  2 days later she could barely ambulate and had to use a wheelchair.  This morning she started experiencing weakness in her right foot.  Denies any weakness in her upper extremities.  States her buttock and groin region is feeling numb.  Her left leg feels numb above the knee.  Denies any diplopia or vision loss.  Denies any urinary or fecal incontinence.  Patient states she has no difficulty with urination or defecation.  Denies any recent fevers or chills.  Denies any cough, shortness of breath, abdominal pain, nausea, vomiting, or dysuria.  Review of Systems: As per HPI otherwise 10 point review of systems negative.  Past Medical History:  Diagnosis Date  . Chest pain   . Depression   . DJD (degenerative joint disease)   . FH: CVA (cerebrovascular accident)    father  . Fibromyalgia   . GERD (gastroesophageal reflux disease)   . Hypothyroidism   . Lupus ([place])   . Migraines   . Palpitations   . Status post cholecystectomy    appendectomy and bilaateral oophorectomy  . Tobacco use     Past Surgical History:  Procedure Laterality Date  . BILATERAL OOPHORECTOMY    . CARDIAC CATHETERIZATION    . CHOLECYSTECTOMY       reports that she has been smoking. She has never used smokeless tobacco. She reports that she does not drink alcohol or use drugs.  Allergies  Allergen  Reactions  . Fexofenadine   . Furosemide   . Gabapentin   . Hydrocodone-Acetaminophen   . Hydroxychloroquine Sulfate   . Morphine   . Nsaids   . Topiramate     Family History  Problem Relation Age of Onset  . Stroke Father     Prior to Admission medications   Medication Sig Start Date End Date Taking? Authorizing Provider  ALPRAZolam Duanne Moron) 0.5 MG tablet Take 0.5 mg by mouth at bedtime as needed for anxiety or sleep.     [provider]  aspirin 81 MG chewable tablet Chew 81 mg by mouth daily.    [provider]  butalbital-acetaminophen-caffeine (FIORICET) 50-325-40 MG per tablet Take 1 tablet by mouth every 6 (six) hours as needed.    [provider]  cholecalciferol (VITAMIN D) 1000 UNITS tablet Take 1,000 Units by mouth daily.    [provider]  diclofenac (VOLTAREN) 75 MG EC tablet Take 75 mg by mouth 2 (two) times daily as needed for mild pain.  06/24/18   [provider]  hydrOXYzine (ATARAX/VISTARIL) 10 MG tablet Take 10 mg by mouth every 8 (eight) hours as needed. For itching 06/22/18   [provider]  ondansetron (ZOFRAN) 4 MG tablet Take 4 mg by mouth every 8 (eight) hours as needed. for nausea 06/22/18   [provider]  oxyCODONE-acetaminophen (PERCOCET) 7.5-325 MG tablet Take 1 tablet by mouth every 8 (eight)  hours as needed. 07/17/18   [provider]  pantoprazole (PROTONIX) 40 MG tablet Take 40 mg by mouth 2 (two) times daily. 05/26/18   [provider]  predniSONE (STERAPRED UNI-PAK 21 TAB) 10 MG (21) TBPK tablet Take by mouth daily. Take 6 tabs by mouth daily  for 2 days, then 5 tabs for 2 days, then 4 tabs for 2 days...continue to taper 07/26/18   Hayden Rasmussen, MD  pregabalin (LYRICA) 50 MG capsule Take 50 mg by mouth 3 (three) times daily. 06/22/18   [provider]  topiramate (TOPAMAX) 25 MG tablet Take 25-50 mg by mouth at bedtime. Take 25-50mg  at bedtime as tolerated 07/08/18    [provider]  valACYclovir (VALTREX) 1000 MG tablet Take 1,000 mg by mouth 2 (two) times daily. 04/17/18   [provider]  valproic acid (DEPAKENE) 250 MG capsule Take 250 mg by mouth 2 (two) times daily.    [provider]  Vitamin D, Ergocalciferol, (DRISDOL) 1.25 MG (50000 UT) CAPS capsule Take 1 capsule by mouth every 7 (seven) days.    [provider]  vitamin E 1000 UNIT capsule Take 1,000 Units by mouth daily.    [provider]    Physical Exam: Vitals:   07/31/18 1645 07/31/18 1800 07/31/18 2015 07/31/18 2039  BP: (!) 95/46 107/86 (!) 108/51 (!) 98/51  Pulse: 69 76 69 (!) 58  Resp:    16  Temp:      TempSrc:      SpO2: 100% 99% 99% 100%    Physical Exam  Constitutional: She is oriented to person, place, and time. She appears well-developed and well-nourished. No distress.  HENT:  Head: Normocephalic.  Mouth/Throat: Oropharynx is clear and moist.  Eyes: Right eye exhibits no discharge. Left eye exhibits no discharge.  Left pupil: Brisk reaction to light Right pupil: Slower to react  Neck: Neck supple.  Cardiovascular: Normal rate, regular rhythm and intact distal pulses.  Pulmonary/Chest: Effort normal and breath sounds normal. No respiratory distress. She has no wheezes. She has no rales.  Abdominal: Soft. Bowel sounds are normal. She exhibits no distension. There is no abdominal tenderness. There is no guarding.  Musculoskeletal:        General: No edema.  Neurological: She is alert and oriented to person, place, and time.  Strength 5 out of 5 in bilateral upper extremities. Strength 3 out of 5 in the left lower extremity. Strength 5 out of 5 in the right lower extremity. Sensation to light touch intact in bilateral upper extremities. Diminished sensation to light touch above the knee in the left lower extremity. Sensation to light touch intact in the right lower extremity.  Skin: Skin is warm and dry. She is not  diaphoretic.     Labs on Admission: I have personally reviewed following labs and imaging studies  CBC: Recent Labs  Lab 07/26/18 1632 07/31/18 1704  WBC 9.3 12.6*  NEUTROABS 7.8*  --   HGB 13.6 12.4  HCT 40.8 38.0  MCV 93.4 94.3  PLT 200 462   Basic Metabolic Panel: Recent Labs  Lab 07/26/18 1632 07/31/18 1704  NA 139 136  K 4.0 4.3  CL 106 102  CO2 25 25  GLUCOSE 118* 121*  BUN 24* 35*  CREATININE 0.81 0.97  CALCIUM 9.5 9.5   GFR: Estimated Creatinine Clearance: 44.2 mL/min (by C-G formula based on SCr of 0.97 mg/dL). Liver Function Tests: Recent Labs  Lab 07/31/18 1704  AST  23  ALT 25  ALKPHOS 68  BILITOT 0.3  PROT 7.0  ALBUMIN 3.7   No results for input(s): LIPASE, AMYLASE in the last 168 hours. No results for input(s): AMMONIA in the last 168 hours. Coagulation Profile: No results for input(s): INR, PROTIME in the last 168 hours. Cardiac Enzymes: Recent Labs  Lab 07/26/18 1632  CKTOTAL 64   BNP (last 3 results) No results for input(s): PROBNP in the last 8760 hours. HbA1C: No results for input(s): HGBA1C in the last 72 hours. CBG: No results for input(s): GLUCAP in the last 168 hours. Lipid Profile: No results for input(s): CHOL, HDL, LDLCALC, TRIG, CHOLHDL, LDLDIRECT in the last 72 hours. Thyroid Function Tests: No results for input(s): TSH, T4TOTAL, FREET4, T3FREE, THYROIDAB in the last 72 hours. Anemia Panel: Recent Labs    07/31/18 1723  VITAMINB12 1,400*   Urine analysis: No results found for: COLORURINE, APPEARANCEUR, LABSPEC, PHURINE, GLUCOSEU, HGBUR, BILIRUBINUR, KETONESUR, PROTEINUR, UROBILINOGEN, NITRITE, LEUKOCYTESUR  Radiological Exams on Admission: Mr Jeri Cos And Wo Contrast  Result Date: 07/31/2018 CLINICAL DATA:  Myelopathy. Left leg weakness and numbness. Urinary incontinence. EXAM: MRI HEAD WITHOUT AND WITH CONTRAST TECHNIQUE: Multiplanar, multiecho pulse sequences of the brain and surrounding structures were obtained  without and with intravenous contrast. CONTRAST:  5 mL Gadavist COMPARISON:  None. FINDINGS: Brain: No acute infarct, mass, midline shift, or extra-axial fluid collection is identified. There is a small to moderate-sized chronic right MCA infarct involving the frontal operculum and insula with a small amount of hemosiderin staining. Small foci of T2 hyperintensity scattered elsewhere in the cerebral white matter bilaterally, most notably in the left corona radiata, are at most mildly advanced for age. No abnormal brain parenchymal or meningeal enhancement is identified. There is mild global cerebral atrophy. Vascular: Major intracranial vascular flow voids are preserved. Skull and upper cervical spine: Unremarkable bone marrow signal. Sinuses/Orbits: Unremarkable orbits. Mild circumferential mucosal thickening and moderate volume fluid in the left maxillary sinus which appears small. Trace right mastoid effusion. Other: Diffuse nodularity/microcystic changes throughout both parotid glands. IMPRESSION: 1. No acute intracranial abnormality. 2. Chronic right MCA infarct. 3. Cerebral white matter T2 signal changes, nonspecific but compatible with mild chronic small vessel ischemic disease. 4. Diffuse nodularity/microcystic changes in the parotid gland suggestive of chronic inflammation or autoimmune disease. Electronically Signed   By: Logan Bores M.D.   On: 07/31/2018 20:13   Mr Thoracic Spine Wo Contrast  Result Date: 07/30/2018 CLINICAL DATA:  Urinary incontinence. Diminished sensation of the left leg. EXAM: MRI THORACIC SPINE WITHOUT CONTRAST TECHNIQUE: Multiplanar, multisequence MR imaging of the thoracic spine was performed. No intravenous contrast was administered. COMPARISON:  None. FINDINGS: Alignment:  Normal Vertebrae: No fracture or primary bone lesion. Cord: Multifocal abnormal cord signal with increased T2 within the central cord. This is most notable at the lower cervical region, T4 and T5, and  T9-T11, but is present to a lesser degree in the intervening cord. There is no cord compression. Paraspinal and other soft tissues: Negative Disc levels: No significant degenerative disease. At T5-6 there is a shallow right posterolateral disc protrusion with its does not compress the neural structures. Minor disc bulges at T9-10 and T11-12. IMPRESSION: Abnormal T2 signal affecting the spinal cord in a patchy fashion from the lower cervical region down to the upper T11 level. Involvement appears to primarily affect the central cord, without lateralization. The differential diagnosis is not specific. This does not appear to be focal syringomyelia. Multifocal intramedullary tumor, ischemic  change of the spinal cord, demyelinating disease and vitamin-B deficiency are all theoretically possible. Electronically Signed   By: Nelson Chimes M.D.   On: 07/30/2018 17:31   Mr Cervical Spine W Or Wo Contrast  Result Date: 07/31/2018 CLINICAL DATA:  Myelopathy. Pain after a fall earlier in the month. Left leg weakness and numbness. Urinary incontinence. History of lupus. EXAM: MRI CERVICAL SPINE WITHOUT AND WITH CONTRAST TECHNIQUE: Multiplanar and multiecho pulse sequences of the cervical spine, to include the craniocervical junction and cervicothoracic junction, were obtained without and with intravenous contrast. CONTRAST:  5 mL Gadavist COMPARISON:  Thoracic spine MRI 07/30/2018 FINDINGS: Intermittent moderate motion artifact on axial sequences. Alignment: Cervical spine straightening.  No listhesis. Vertebrae: No fracture, suspicious osseous lesion, or significant marrow edema. Cord: Multifocal T2 hyperintensity in the cervical spinal cord including a 12 mm enhancing lesion in the left hemicord at C3-4 and a nearly 2 cm minimally enhancing lesion in the right and central aspects of the cord at C7. Lower level nonenhancing T2 hyperintensity elsewhere in the cord including in the upper thoracic spine and on the right at C2.  Posterior Fossa, vertebral arteries, paraspinal tissues: Microcystic changes in the parotid glands as reported on brain MRI. Preserved vertebral artery flow voids. Disc levels: C2-3: Negative. C3-4: Negative. C4-5: Mild disc space narrowing. Mild disc bulging and minimal uncovertebral spurring without significant stenosis. C5-6: Mild disc space narrowing. Mild disc bulging and uncovertebral spurring without significant stenosis. C6-7: Moderate disc space narrowing. Disc bulging, a small left posterolateral disc protrusion, and uncovertebral spurring result in borderline to mild right and mild-to-moderate left neural foraminal stenosis without significant spinal stenosis. C7-T1: Shallow right paracentral disc protrusion without stenosis. IMPRESSION: 1. Multifocal T2 signal abnormality in the cervical spinal cord including enhancing lesions at C3-4 and C7. The appearance is nonspecific, however lupus myelitis, other inflammatory or infectious myelitis, and demyelinating disease are considerations. Tumor and ischemia are felt to be unlikely. 2. Cervical disc degeneration greatest at C6-7 where there is mild-to-moderate left neural foraminal stenosis. No significant spinal stenosis. Electronically Signed   By: Logan Bores M.D.   On: 07/31/2018 20:52    Assessment/Plan Principal Problem:   Myelitis ([place]) Active Problems:   Leukocytosis   Anxiety   GERD (gastroesophageal reflux disease)   Myelitis -Patient is presenting with complaints of left lower extremity weakness and numbness, right foot weakness, and numbness of buttock and groin region. -She had outpatient imaging done on July 31, 2018. MRI brain showing no acute intracranial abnormality.  MRI C spine and thoracic spine showing "abnormal T2 signal affecting the spinal cord in a patchy fashion from the lower cervical region down to the upper T11 level. Involvement appears to primarily affect the central cord, without lateralization. The  differential diagnosis is not specific. This does not appear to be focal syringomyelia. Multifocal intramedullary tumor, ischemic change of the spinal cord, demyelinating disease and vitamin-B deficiency are all theoretically possible."  -Vitamin B12 level checked today at 1400. -ED provider discussed the case with neurology.  Differential diagnosis includes neuromyelitis optica, VZV myelitis, and lupus myelitis. Neurology recommended LP which was done by ED provider.  Recommended high-dose steroids. -Pending LP labs: Protein, glucose, cell count with differential, IgG, VZV PCR, HSV PCR, aquaporin 4 antibodies -IV Solu-Medrol 1 g daily for 5 days -PPI -PT/OT eval  Mild leukocytosis -Likely reactive.  White count 12.6.  Patient is afebrile.  Denies any respiratory complaints and lungs clear on exam.  Does not endorse any  UTI symptoms.  Nontoxic-appearing.  Continue to monitor white count.  Anxiety -Continue home Xanax PRN.  GERD -PPI  Unable to safely order home medications at this time as pharmacy medication reconciliation is pending.  DVT prophylaxis: SCDs Code Status: Full code Family Communication: No family available. Disposition Plan: Anticipate discharge in 5 days. Consults called: Neurology (Dr. Lorraine Lax) admission status: It is my clinical opinion that admission to [facility] is reasonable and necessary in this 68 y.o. female . presenting with symptoms of lower extremity weakness/numbness and groin/buttock numbness . with pertinent positives on physical exam including: Weakness of left lower extremity with diminished sensation about the knee . and pertinent positives on radiographic and laboratory data including: MRI with evidence of myelitis . Workup and treatment include IV high-dose steroids for 5 days.  Given the aforementioned, the predictability of an adverse outcome is felt to be significant. I expect that the patient will require at least 2 midnights in the hospital to  treat this condition.   This chart was dictated using voice recognition software.  Despite best efforts to proofread, errors can occur which can change the documentation meaning.  Shela Leff MD [facility] Pager [phone]  If 7PM-7AM, please contact night-coverage [email or web address] Password [facility]  07/31/2018, 10:34 PM

## 2018-07-31 NOTE — ED Triage Notes (Signed)
Pt here for pain after a fall earlier in the month.  Pain in the left left and lower back.  A&Ox4. Hx of fibra myalgia, lupus, arthritis.

## 2018-08-01 ENCOUNTER — Encounter (HOSPITAL_COMMUNITY): Payer: Self-pay | Admitting: *Deleted

## 2018-08-01 ENCOUNTER — Inpatient Hospital Stay (HOSPITAL_COMMUNITY): Payer: Medicare HMO

## 2018-08-01 DIAGNOSIS — M7989 Other specified soft tissue disorders: Secondary | ICD-10-CM

## 2018-08-01 DIAGNOSIS — I82402 Acute embolism and thrombosis of unspecified deep veins of left lower extremity: Secondary | ICD-10-CM

## 2018-08-01 LAB — CBC
HCT: 34.7 % — ABNORMAL LOW (ref 36.0–46.0)
Hemoglobin: 11.9 g/dL — ABNORMAL LOW (ref 12.0–15.0)
MCH: 31.5 pg (ref 26.0–34.0)
MCHC: 34.3 g/dL (ref 30.0–36.0)
MCV: 91.8 fL (ref 80.0–100.0)
Platelets: 189 10*3/uL (ref 150–400)
RBC: 3.78 MIL/uL — ABNORMAL LOW (ref 3.87–5.11)
RDW: 12.7 % (ref 11.5–15.5)
WBC: 10.3 10*3/uL (ref 4.0–10.5)
nRBC: 0 % (ref 0.0–0.2)

## 2018-08-01 LAB — HIV ANTIBODY (ROUTINE TESTING W REFLEX): HIV Screen 4th Generation wRfx: NONREACTIVE

## 2018-08-01 LAB — VITAMIN B12: Vitamin B-12: 1457 pg/mL — ABNORMAL HIGH (ref 180–914)

## 2018-08-01 MED ORDER — HYDROXYZINE HCL 10 MG PO TABS
10.0000 mg | ORAL_TABLET | Freq: Three times a day (TID) | ORAL | Status: DC | PRN
  Filled 2018-08-01: qty 1

## 2018-08-01 MED ORDER — VALPROIC ACID 250 MG PO CAPS
250.0000 mg | ORAL_CAPSULE | Freq: Two times a day (BID) | ORAL | Status: DC
  Administered 2018-08-01 – 2018-08-03 (×5): 250 mg via ORAL
  Filled 2018-08-01 (×5): qty 1

## 2018-08-01 MED ORDER — ALPRAZOLAM 0.5 MG PO TABS
0.5000 mg | ORAL_TABLET | Freq: Three times a day (TID) | ORAL | Status: DC | PRN
  Administered 2018-08-01 – 2018-08-02 (×2): 0.5 mg via ORAL
  Filled 2018-08-01 (×2): qty 1

## 2018-08-01 MED ORDER — TRAMADOL HCL 50 MG PO TABS: 50.0000 mg | ORAL_TABLET | Freq: Once | ORAL | Status: DC

## 2018-08-01 MED ORDER — ENOXAPARIN SODIUM 60 MG/0.6ML ~~LOC~~ SOLN
60.0000 mg | Freq: Two times a day (BID) | SUBCUTANEOUS | Status: DC
  Administered 2018-08-01 – 2018-08-03 (×5): 60 mg via SUBCUTANEOUS
  Filled 2018-08-01 (×5): qty 0.6

## 2018-08-01 MED ORDER — PREGABALIN 50 MG PO CAPS
50.0000 mg | ORAL_CAPSULE | Freq: Three times a day (TID) | ORAL | Status: DC
  Administered 2018-08-01 – 2018-08-03 (×7): 50 mg via ORAL
  Filled 2018-08-01 (×7): qty 1

## 2018-08-01 MED ORDER — PANTOPRAZOLE SODIUM 40 MG PO TBEC
40.0000 mg | DELAYED_RELEASE_TABLET | Freq: Two times a day (BID) | ORAL | Status: DC
  Administered 2018-08-01 – 2018-08-03 (×4): 40 mg via ORAL
  Filled 2018-08-01 (×4): qty 1

## 2018-08-01 MED ORDER — VITAMIN B-12 100 MCG PO TABS
250.0000 ug | ORAL_TABLET | Freq: Every day | ORAL | Status: DC
  Administered 2018-08-01 – 2018-08-03 (×3): 250 ug via ORAL
  Filled 2018-08-01 (×3): qty 3

## 2018-08-01 MED ORDER — VITAMIN D (ERGOCALCIFEROL) 1.25 MG (50000 UNIT) PO CAPS
50000.0000 [IU] | ORAL_CAPSULE | ORAL | Status: DC
  Administered 2018-08-02: 50000 [IU] via ORAL
  Filled 2018-08-01: qty 1

## 2018-08-01 MED ORDER — VITAMIN E 45 MG (100 UNIT) PO CAPS
1000.0000 [IU] | ORAL_CAPSULE | Freq: Every day | ORAL | Status: DC
  Administered 2018-08-01 – 2018-08-03 (×3): 1000 [IU] via ORAL
  Filled 2018-08-01 (×3): qty 2

## 2018-08-01 MED ORDER — OXYCODONE-ACETAMINOPHEN 7.5-325 MG PO TABS
1.0000 | ORAL_TABLET | Freq: Three times a day (TID) | ORAL | Status: DC | PRN
  Administered 2018-08-01 – 2018-08-02 (×4): 1 via ORAL
  Filled 2018-08-01 (×4): qty 1

## 2018-08-01 MED ORDER — LORATADINE 10 MG PO TABS
10.0000 mg | ORAL_TABLET | Freq: Every day | ORAL | Status: DC
  Administered 2018-08-01 – 2018-08-03 (×3): 10 mg via ORAL
  Filled 2018-08-01 (×3): qty 1

## 2018-08-01 NOTE — Progress Notes (Signed)
Patient called family members complaining that we will not give her her pain medication; family called front desk and RN spoke with daughter-in-law Barnie Alderman [phone]) who was a former Therapist, sports at [employer] and explained situation.  [name] wants her doctor to call her after she rounds on patient so they can discuss her medication treatment.

## 2018-08-01 NOTE — Progress Notes (Signed)
PT Cancellation Note  Patient Details Name: Susan Hodge MRN: [id number] DOB: 26-Aug-1950   Cancelled Treatment:    Reason Eval/Treat Not Completed: Medical issues which prohibited therapy (positive LLE DVT).  Ellamae Sia, PT, DPT Acute Rehabilitation Services Pager [phone] Office [phone]    Willy Eddy 08/01/2018, 10:33 AM

## 2018-08-01 NOTE — Progress Notes (Signed)
LLE venous duplex       has been completed. Preliminary results can be found under CV proc through chart review. June Leap, BS, RDMS, RVT  Gave positive results to [facility]. RN

## 2018-08-01 NOTE — Progress Notes (Signed)
On call doctor gave ultram for pain; patient yelling at RN; patient refusing medication; she wants her percocet for pain and now her xanax for anxiety.  I explained to her we did not have a doctors order for her to have this medication at this time, patient still screaming at nurse.  Patient requesting to see a doctor now, or she will call her primary care provider over this.  Offered the tramadol again, patient still refused.  Will page on call and let them know patient requesting to see a doctor now, explained were getting ready to change shifts and the hospital doctor should be in this morning and she could speak to him.

## 2018-08-01 NOTE — Progress Notes (Addendum)
NEUROLOGY PROGRESS NOTE  Brief History Susan Hodge is a 68 y.o. female with past medical history of migraines on valproic acid, but no longer on Topamax. 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  She first presented to [facility] ER on 3/23 due to the inability to walk and weakness in left leg. MRI of her lumbar spine for back pain lower extremity weakness and discharged from the emergency department after showed only mild bulging disks and some foraminal encroachment.  This was gradual over last 3 weeks when she describes a fall, but says this only hurt her left hip. She also complains of tingling sensation over the lateral aspect of her left thigh, but improved after her PCP prescribed her Lyrica.  She received an outpatient MRI T-spine which showed multiple areas of T2 signal changes from the lower cervical region to the upper T11 region and was then recommended to go to the emergency room on 2/28. Upon admit, MRI C-spine also shows enhancing lesion at the C3-C4 level and again at the C7 level concerning for acute myelitis/demyelination.  MRI brain shows right frontal encephalomalacia, possibly in chronic infarct that was asymptomatic.  Subjective She appears considerably uncomfortable and in pain. She c/o back and hip pain during exam. She is quite anxious and needs re-directing. MRIs were reviewed with her and imaging shown to her at bedside.   ROS: 14 systems reviewed and neg except for above  Past Medical History Past Medical History:  Diagnosis Date  . Chest pain   . Depression   . DJD (degenerative joint disease)   . FH: CVA (cerebrovascular accident)    father  . Fibromyalgia   . GERD (gastroesophageal reflux disease)   . Hypothyroidism   . Lupus ([place])   . Migraines   . Palpitations   . Status post cholecystectomy    appendectomy and bilaateral oophorectomy  .  Tobacco use     Past Surgical History Past Surgical History:  Procedure Laterality Date  . BILATERAL OOPHORECTOMY    . CARDIAC CATHETERIZATION    . CHOLECYSTECTOMY      Allergies Allergies  Allergen Reactions  . Amitriptyline Other (See Comments)  . Bee Venom Other (See Comments)  . Fexofenadine Other (See Comments)  . Furosemide Other (See Comments)  . Gabapentin Other (See Comments)  . Hydroxychloroquine Sulfate Other (See Comments)  . Morphine Other (See Comments)  . Nsaids   . Oxycodone Other (See Comments)    dizzy  . Topiramate Other (See Comments)    Home Medications Medications Prior to Admission  Medication Sig Dispense Refill  . ALPRAZolam (XANAX) 0.5 MG tablet Take 0.5 mg by mouth 3 (three) times daily as needed for anxiety or sleep.     . Cyanocobalamin (VITAMIN B-12 PO) Take 2 tablets by mouth daily.    . diclofenac (VOLTAREN) 75 MG EC tablet Take 75 mg by mouth 2 (two) times daily as needed for mild pain.     . hydrOXYzine (ATARAX/VISTARIL) 10 MG tablet Take 10 mg by mouth every 8 (eight) hours as needed. For itching    . loratadine (CLARITIN) 10 MG tablet Take 10 mg by mouth daily.    . ondansetron (ZOFRAN) 4 MG tablet Take 2 mg by mouth every 8 (eight) hours as needed. for nausea    . oxyCODONE-acetaminophen (PERCOCET) 7.5-325 MG tablet Take 1 tablet by  mouth every 8 (eight) hours as needed.    . predniSONE (STERAPRED UNI-PAK 21 TAB) 10 MG (21) TBPK tablet Take by mouth daily. Take 6 tabs by mouth daily  for 2 days, then 5 tabs for 2 days, then 4 tabs for 2 days...continue to taper 42 tablet 0  . pregabalin (LYRICA) 50 MG capsule Take 50 mg by mouth 3 (three) times daily.    Marland Kitchen valproic acid (DEPAKENE) 250 MG capsule Take 250 mg by mouth 2 (two) times daily.    . Vitamin D, Ergocalciferol, (DRISDOL) 1.25 MG (50000 UT) CAPS capsule Take 1 capsule by mouth every Monday.     . vitamin E 1000 UNIT capsule Take 1,000 Units by mouth daily.    . valACYclovir  (VALTREX) 1000 MG tablet Take 1,000 mg by mouth 2 (two) times daily.      Hospital Medications . pantoprazole  40 mg Oral Daily  . traMADol  50 mg Oral Once   Objective Intake/Output from previous day: 03/28 0701 - 03/29 0700 In: 230 [P.O.:180; IV Piggyback:50] Out: 300 [Urine:300]  Physical Exam -  Vitals:   07/31/18 2300 08/01/18 0000 08/01/18 0510 08/01/18 0824  BP: (!) 110/58 (!) 117/53 (!) 107/48 94/68  Pulse: 66 70 68 70  Resp: 19 19 18 18   Temp: 97.6 F (36.4 C) 97.7 F (36.5 C) (!) 97.4 F (36.3 C) 97.9 F (36.6 C)  TempSrc: Oral Oral Oral Oral  SpO2: 100% 100% 100% 100%  Weight: 57.1 kg     Height: 5' (1.524 m)      General - moderate distress; pain and discomfort; appears stated age Heart - Regular rate and rhythm  Lungs - Clear to auscultation Abdomen - Soft - non tender Extremities - Distal pulses intact - slight edema noted in left foot only. Skin - Warm and dry  Neurologic Exam:  Mental Status:  Alert, oriented, thought content appropriate. Speech without evidence of dysarthria or aphasia. Able to follow 3 step commands without difficulty.  Cranial Nerves:  II-bilateral visual fields intact III/IV/VI-Pupils were equal and reacted. Extraocular movements were full.  V/VII-no facial numbness and no facial weakness.  VIII-hearing normal in left to finger rub, absent to finger rub in right.  X-normal speech and symmetrical palatal movement.  XII-midline tongue extension  Motor: Right : Upper extremity   5/5    Left:     Upper extremity   5/5  Lower extremity:                                                         Lower extremity: 4+/5 hip flexor                                                             2/5 hip flexor 5/5 hip adductors  2/5 hip adductors 5/5 hip abductors                                                        2/5 hip abductors 5/5 quadricep                                                               2/5 quadriceps  5/5 hamstrings                                                            2/5 hamstrings 5/5 plantar flexion                                                       4+/5 plantar flexion 5/5 dorsiflexion                                                0/5 Dorsiflexion Tone and bulk:normal tone throughout; no atrophy noted Sensory: Intact to light touch in all extremities. Deep Tendon Reflexes: 4/4 throughout; 2 beats clonus noted in left ankle Plantars: Downgoing bilaterally  Gait: did not tested d/t saftey  LABORATORY RESULTS:  Basic Metabolic Panel: Recent Labs  Lab 07/26/18 1632 07/31/18 1704  NA 139 136  K 4.0 4.3  CL 106 102  CO2 25 25  GLUCOSE 118* 121*  BUN 24* 35*  CREATININE 0.81 0.97  CALCIUM 9.5 9.5   Liver Function Tests: Recent Labs  Lab 07/31/18 1704  AST 23  ALT 25  ALKPHOS 68  BILITOT 0.3  PROT 7.0  ALBUMIN 3.7   CBC: Recent Labs  Lab 07/26/18 1632 07/31/18 1704 08/01/18 0242  WBC 9.3 12.6* 10.3  NEUTROABS 7.8*  --   --   HGB 13.6 12.4 11.9*  HCT 40.8 38.0 34.7*  MCV 93.4 94.3 91.8  PLT 200 200 189   IMAGING RESULTS Mr Jeri Cos And Wo Contrast  Result Date: 07/31/2018 CLINICAL DATA:  Myelopathy. Left leg weakness and numbness. Urinary incontinence. EXAM: MRI HEAD WITHOUT AND WITH CONTRAST TECHNIQUE: Multiplanar, multiecho pulse sequences of the brain and surrounding structures were obtained without and with intravenous contrast. CONTRAST:  5 mL Gadavist COMPARISON:  None. FINDINGS: Brain: No acute infarct, mass, midline shift, or extra-axial fluid collection is identified. There is a small to moderate-sized chronic right MCA infarct involving the frontal operculum and insula with a small amount of hemosiderin staining. Small foci of T2 hyperintensity scattered elsewhere in the cerebral white matter bilaterally, most notably in the left corona radiata, are at most mildly advanced for age. No abnormal brain parenchymal or  meningeal enhancement is identified. There is  mild global cerebral atrophy. Vascular: Major intracranial vascular flow voids are preserved. Skull and upper cervical spine: Unremarkable bone marrow signal. Sinuses/Orbits: Unremarkable orbits. Mild circumferential mucosal thickening and moderate volume fluid in the left maxillary sinus which appears small. Trace right mastoid effusion. Other: Diffuse nodularity/microcystic changes throughout both parotid glands. IMPRESSION: 1. No acute intracranial abnormality. 2. Chronic right MCA infarct. 3. Cerebral white matter T2 signal changes, nonspecific but compatible with mild chronic small vessel ischemic disease. 4. Diffuse nodularity/microcystic changes in the parotid gland suggestive of chronic inflammation or autoimmune disease. Electronically Signed   By: Logan Bores M.D.   On: 07/31/2018 20:13   Mr Thoracic Spine Wo Contrast  Result Date: 07/30/2018 CLINICAL DATA:  Urinary incontinence. Diminished sensation of the left leg. EXAM: MRI THORACIC SPINE WITHOUT CONTRAST TECHNIQUE: Multiplanar, multisequence MR imaging of the thoracic spine was performed. No intravenous contrast was administered. COMPARISON:  None. FINDINGS: Alignment:  Normal Vertebrae: No fracture or primary bone lesion. Cord: Multifocal abnormal cord signal with increased T2 within the central cord. This is most notable at the lower cervical region, T4 and T5, and T9-T11, but is present to a lesser degree in the intervening cord. There is no cord compression. Paraspinal and other soft tissues: Negative Disc levels: No significant degenerative disease. At T5-6 there is a shallow right posterolateral disc protrusion with its does not compress the neural structures. Minor disc bulges at T9-10 and T11-12. IMPRESSION: Abnormal T2 signal affecting the spinal cord in a patchy fashion from the lower cervical region down to the upper T11 level. Involvement appears to primarily affect the central cord,  without lateralization. The differential diagnosis is not specific. This does not appear to be focal syringomyelia. Multifocal intramedullary tumor, ischemic change of the spinal cord, demyelinating disease and vitamin-B deficiency are all theoretically possible. Electronically Signed   By: Nelson Chimes M.D.   On: 07/30/2018 17:31   Mr Cervical Spine W Or Wo Contrast  Result Date: 07/31/2018 CLINICAL DATA:  Myelopathy. Pain after a fall earlier in the month. Left leg weakness and numbness. Urinary incontinence. History of lupus. EXAM: MRI CERVICAL SPINE WITHOUT AND WITH CONTRAST TECHNIQUE: Multiplanar and multiecho pulse sequences of the cervical spine, to include the craniocervical junction and cervicothoracic junction, were obtained without and with intravenous contrast. CONTRAST:  5 mL Gadavist COMPARISON:  Thoracic spine MRI 07/30/2018 FINDINGS: Intermittent moderate motion artifact on axial sequences. Alignment: Cervical spine straightening.  No listhesis. Vertebrae: No fracture, suspicious osseous lesion, or significant marrow edema. Cord: Multifocal T2 hyperintensity in the cervical spinal cord including a 12 mm enhancing lesion in the left hemicord at C3-4 and a nearly 2 cm minimally enhancing lesion in the right and central aspects of the cord at C7. Lower level nonenhancing T2 hyperintensity elsewhere in the cord including in the upper thoracic spine and on the right at C2. Posterior Fossa, vertebral arteries, paraspinal tissues: Microcystic changes in the parotid glands as reported on brain MRI. Preserved vertebral artery flow voids. Disc levels: C2-3: Negative. C3-4: Negative. C4-5: Mild disc space narrowing. Mild disc bulging and minimal uncovertebral spurring without significant stenosis. C5-6: Mild disc space narrowing. Mild disc bulging and uncovertebral spurring without significant stenosis. C6-7: Moderate disc space narrowing. Disc bulging, a small left posterolateral disc protrusion, and  uncovertebral spurring result in borderline to mild right and mild-to-moderate left neural foraminal stenosis without significant spinal stenosis. C7-T1: Shallow right paracentral disc protrusion without stenosis. IMPRESSION: 1. Multifocal T2 signal abnormality in the cervical spinal cord  including enhancing lesions at C3-4 and C7. The appearance is nonspecific, however lupus myelitis, other inflammatory or infectious myelitis, and demyelinating disease are considerations. Tumor and ischemia are felt to be unlikely. 2. Cervical disc degeneration greatest at C6-7 where there is mild-to-moderate left neural foraminal stenosis. No significant spinal stenosis. Electronically Signed   By: Logan Bores M.D.   On: 07/31/2018 20:52   Vas [place] Lower Extremity Venous (dvt)  Result Date: 08/01/2018  Lower Venous Study Indications: Pain.  Performing Technologist: June Leap RDMS, RVT  Examination Guidelines: A complete evaluation includes B-mode imaging, spectral Doppler, color Doppler, and power Doppler as needed of all accessible portions of each vessel. Bilateral testing is considered an integral part of a complete examination. Limited examinations for reoccurring indications may be performed as noted.  Right Venous Findings: +---+---------------+---------+-----------+----------+-------+    CompressibilityPhasicitySpontaneityPropertiesSummary +---+---------------+---------+-----------+----------+-------+ CFVFull           Yes      Yes                          +---+---------------+---------+-----------+----------+-------+  Left Venous Findings: +---------+---------------+---------+-----------+----------+-------+          CompressibilityPhasicitySpontaneityPropertiesSummary +---------+---------------+---------+-----------+----------+-------+ CFV      Full           Yes      Yes                          +---------+---------------+---------+-----------+----------+-------+ SFJ      Full                                                  +---------+---------------+---------+-----------+----------+-------+ FV Prox  Full                                                 +---------+---------------+---------+-----------+----------+-------+ FV Mid   Full                                                 +---------+---------------+---------+-----------+----------+-------+ FV DistalFull                                                 +---------+---------------+---------+-----------+----------+-------+ PFV      Full                                                 +---------+---------------+---------+-----------+----------+-------+ POP      Full           Yes      Yes                          +---------+---------------+---------+-----------+----------+-------+ PTV      Full                                                 +---------+---------------+---------+-----------+----------+-------+  PERO     Full                                                 +---------+---------------+---------+-----------+----------+-------+ Gastroc  None                                         Acute   +---------+---------------+---------+-----------+----------+-------+    Summary: Right: No evidence of common femoral vein obstruction. Left: Findings consistent with acute deep vein thrombosis involving the left gastrocnemius vein. No cystic structure found in the popliteal fossa.  *See table(s) above for measurements and observations.    Preliminary     Assessment/Plan: Patient is a 68 year old female past medical history of migraines, fibromyalgia, SLE, herpes zoster. She presents the emergency department with progressive weakness in her left leg and inability to ambulate. Did improve somewhat with Lyrica. Abnormal Cspine and Tspine imaging reviewed with pt. Brain without demyelinating lesions, but there is an old stroke; pt denies any clinical symptoms.    Myelitis at C3-4 and C7  (enhancing) with multiple areas of longitudinal signal changes suggestive of prior myelitis/demyelination There is patchy lesions noted in T4-5 and T9-11. - DDX includes neuromyelitis optica, VZV myelitis, lupus myelitis, MS, transverse myelitis.  -LP: benign thus far. Added ACE. VZV PCR, HSV PCR and aquaporin 4 antibodies    Plan F/u on VZV PCR, HSV PCR and aquaporin 4 antibodies Added ACE to CSF and serum IV solumedrol 1g daily x 5days Start patient on Protonix during steroid use Continue B12 and Calcium supplements Will ck B12 and Cu levels PT/OT; may need inpt rehab for safe d/c plan Will need outpatient neurology follow up with Dr. Felecia Shelling   -- Desiree Metzger-Cihelka, ARNP-C, ANVP-BC [facility]   Attending Neurohospitalist Addendum Patient seen and examined with APP/Resident. Agree with the history and physical as documented above. Agree with the plan as documented, which I helped formulate. I have independently reviewed the chart, obtained history, review of systems and examined the patient.I have personally reviewed pertinent head/neck/spine imaging (CT/MRI). Please feel free to call with any questions. --- Amie Portland, MD [facility] Pager: [phone]  If 7pm to 7am, please call on call as listed on AMION.

## 2018-08-01 NOTE — Progress Notes (Signed)
Patient c/o left leg pain 10 out of 10; patient refusing to take tylenol; paged on call.

## 2018-08-01 NOTE — Progress Notes (Signed)
PROGRESS NOTE                                                                                                                                                                                                             Patient Demographics:    Susan Hodge, is a 68 y.o. female, DOB - 1951-05-01, WFU:[id number]  Admit date - 07/31/2018   Admitting Physician Shela Leff, MD  Outpatient Primary MD for the patient is [name], NP  LOS - 1   Chief Complaint  Patient presents with   Back Pain   Hip Pain       Brief Narrative   68 y.o. 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  Patient states she had a fall over 2 weeks ago and fell on her buttocks.  Presents to [facility] due to fall and inability to walk, and her lumbar spine thickened for mild bulging disks and some foraminal encroachment, had outpatient MRI T-spine which did show multiple T2 signal changes in the lower cervical region to upper T11, he had clinical suspicion of myelitis, admitted for further work-up.   Subjective:    [name] today has, No headache, No chest pain, No abdominal pain - No Nausea, skin about resuming her home pain regimen   Assessment  & Plan :    Principal Problem:   Myelitis ([place]) Active Problems:   Leukocytosis   Anxiety   GERD (gastroesophageal reflux disease)   Myelitis -Regions of myelitis at C3-4, and C7, enhancing, with multiple areas of longitudinal signal changes suggestive of prior myelitis/demyelination, and patchy lesions noted in T4-5, and T9-11. -Neurology input greatly appreciated, discussed with neurology, her work-up significant for myelitis, due to  autoimmune process, most likely in the setting of lupus, differential diagnoses include transverse myelitis, MS, myelitis optica, VZV myelitis. -LP performed, follow on ACE, VZV PCR, HSV PCR, aquaprion for antibodies. -T/OT consulted -Continue with IV Solu-Medrol 1 g IV daily total of 5 days -Continue B12 and calcium supplements  Acute left lower extremity DVT -Sent with left lower extremity pain, swelling, venous Doppler significant  for acute DVT, started on Lovenox, this is most likely provoked given her fall, left lower extremity trauma and poor mobility, started on Lovenox   mild leukocytosis -Likely reactive.  White count 12.6.  Patient is afebrile.  Denies any respiratory complaints and lungs clear on exam.  Does not endorse any UTI symptoms.  Nontoxic-appearing.  Continue to monitor white count.  Anxiety -Continue home Xanax PRN.  GERD -PPI, will increase to twice daily given she is on steroids and Lovenox    Code Status : Full  Family Communication  : D/W patient  Disposition Plan  : pending PT evaluation  Barriers For Discharge : remains on IV steroids  Consults  :  neurology  Procedures  : None  DVT Prophylaxis  :  [place] lovenox  Lab Results  Component Value Date   PLT 189 08/01/2018    Antibiotics  :    Anti-infectives (From admission, onward)   None        Objective:   Vitals:   07/31/18 2300 08/01/18 0000 08/01/18 0510 08/01/18 0824  BP: (!) 110/58 (!) 117/53 (!) 107/48 94/68  Pulse: 66 70 68 70  Resp: 19 19 18 18   Temp: 97.6 F (36.4 C) 97.7 F (36.5 C) (!) 97.4 F (36.3 C) 97.9 F (36.6 C)  TempSrc: Oral Oral Oral Oral  SpO2: 100% 100% 100% 100%  Weight: 57.1 kg     Height: 5' (1.524 m)       Wt Readings from Last 3 Encounters:  07/31/18 57.1 kg  07/26/18 56.2 kg     Intake/Output Summary (Last 24 hours) at 08/01/2018 1153 Last data filed at 08/01/2018 0500 Gross per 24 hour  Intake 230 ml  Output 300 ml  Net -70 ml      Physical Exam  Awake Alert, Oriented X 3,Normal affect Symmetrical Chest wall movement, Good air movement bilaterally, CTAB RRR,No Gallops,Rubs or new Murmurs, No Parasternal Heave +ve B.Sounds, Abd Soft,No organomegaly appriciated, No rebound - guarding or rigidity. No Cyanosis, Clubbing , left lower est +1  edema, No new Rash or bruise      Data Review:    CBC Recent Labs  Lab 07/26/18 1632 07/31/18 1704 08/01/18 0242  WBC 9.3 12.6* 10.3  HGB 13.6 12.4 11.9*  HCT 40.8 38.0 34.7*  PLT 200 200 189  MCV 93.4 94.3 91.8  MCH 31.1 30.8 31.5  MCHC 33.3 32.6 34.3  RDW 12.6 12.7 12.7  LYMPHSABS 0.8  --   --   MONOABS 0.5  --   --   EOSABS 0.1  --   --   BASOSABS 0.0  --   --     Chemistries  Recent Labs  Lab 07/26/18 1632 07/31/18 1704  NA 139 136  K 4.0 4.3  CL 106 102  CO2 25 25  GLUCOSE 118* 121*  BUN 24* 35*  CREATININE 0.81 0.97  CALCIUM 9.5 9.5  AST  --  23  ALT  --  25  ALKPHOS  --  68  BILITOT  --  0.3   ------------------------------------------------------------------------------------------------------------------ No results for input(s): CHOL, HDL, LDLCALC, TRIG, CHOLHDL, LDLDIRECT in the last 72 hours.  No results found for: HGBA1C ------------------------------------------------------------------------------------------------------------------ No results for input(s): TSH, T4TOTAL, T3FREE, THYROIDAB in the last 72 hours.  Invalid input(s): FREET3 ------------------------------------------------------------------------------------------------------------------ Recent Labs    07/31/18 1723  VITAMINB12 1,400*    Coagulation profile No results for input(s): INR, PROTIME in the last 168 hours.  No results for input(s): DDIMER  in the last 72 hours.  Cardiac Enzymes No results for input(s): CKMB, TROPONINI, MYOGLOBIN in the last 168 hours.  Invalid input(s):  CK ------------------------------------------------------------------------------------------------------------------ No results found for: BNP  Inpatient Medications  Scheduled Meds:  loratadine  10 mg Oral Daily   pantoprazole  40 mg Oral BID   pregabalin  50 mg Oral TID   traMADol  50 mg Oral Once   valproic acid  250 mg Oral BID   vitamin B-12  250 mcg Oral Daily   [START ON 08/02/2018] Vitamin D (Ergocalciferol)  50,000 Units Oral Q Mon   vitamin E  1,000 Units Oral Daily   Continuous Infusions:  methylPREDNISolone (SOLU-MEDROL) injection     PRN Meds:.acetaminophen **OR** acetaminophen, ALPRAZolam, hydrOXYzine, oxyCODONE-acetaminophen  Micro Results Recent Results (from the past 240 hour(s))  CSF culture with Stat gram stain     Status: None (Preliminary result)   Collection Time: 07/31/18 10:00 PM  Result Value Ref Range Status   Specimen Description CSF  Final   Special Requests TUBE 2  Final   Gram Stain   Final    CYTOSPIN SMEAR WBC PRESENT, PREDOMINANTLY MONONUCLEAR NO ORGANISMS SEEN    Culture   Final    NO GROWTH < 12 HOURS Performed at [facility], [address], [place], [place] [address]    Report Status PENDING  Incomplete    Radiology Reports Mr Jeri Cos And Wo Contrast  Result Date: 07/31/2018 CLINICAL DATA:  Myelopathy. Left leg weakness and numbness. Urinary incontinence. EXAM: MRI HEAD WITHOUT AND WITH CONTRAST TECHNIQUE: Multiplanar, multiecho pulse sequences of the brain and surrounding structures were obtained without and with intravenous contrast. CONTRAST:  5 mL Gadavist COMPARISON:  None. FINDINGS: Brain: No acute infarct, mass, midline shift, or extra-axial fluid collection is identified. There is a small to moderate-sized chronic right MCA infarct involving the frontal operculum and insula with a small amount of hemosiderin staining. Small foci of T2 hyperintensity scattered elsewhere in the cerebral white matter bilaterally,  most notably in the left corona radiata, are at most mildly advanced for age. No abnormal brain parenchymal or meningeal enhancement is identified. There is mild global cerebral atrophy. Vascular: Major intracranial vascular flow voids are preserved. Skull and upper cervical spine: Unremarkable bone marrow signal. Sinuses/Orbits: Unremarkable orbits. Mild circumferential mucosal thickening and moderate volume fluid in the left maxillary sinus which appears small. Trace right mastoid effusion. Other: Diffuse nodularity/microcystic changes throughout both parotid glands. IMPRESSION: 1. No acute intracranial abnormality. 2. Chronic right MCA infarct. 3. Cerebral white matter T2 signal changes, nonspecific but compatible with mild chronic small vessel ischemic disease. 4. Diffuse nodularity/microcystic changes in the parotid gland suggestive of chronic inflammation or autoimmune disease. Electronically Signed   By: Logan Bores M.D.   On: 07/31/2018 20:13   Mr Thoracic Spine Wo Contrast  Result Date: 07/30/2018 CLINICAL DATA:  Urinary incontinence. Diminished sensation of the left leg. EXAM: MRI THORACIC SPINE WITHOUT CONTRAST TECHNIQUE: Multiplanar, multisequence MR imaging of the thoracic spine was performed. No intravenous contrast was administered. COMPARISON:  None. FINDINGS: Alignment:  Normal Vertebrae: No fracture or primary bone lesion. Cord: Multifocal abnormal cord signal with increased T2 within the central cord. This is most notable at the lower cervical region, T4 and T5, and T9-T11, but is present to a lesser degree in the intervening cord. There is no cord compression. Paraspinal and other soft tissues: Negative Disc levels: No significant degenerative disease. At T5-6 there is a shallow right posterolateral  disc protrusion with its does not compress the neural structures. Minor disc bulges at T9-10 and T11-12. IMPRESSION: Abnormal T2 signal affecting the spinal cord in a patchy fashion from the  lower cervical region down to the upper T11 level. Involvement appears to primarily affect the central cord, without lateralization. The differential diagnosis is not specific. This does not appear to be focal syringomyelia. Multifocal intramedullary tumor, ischemic change of the spinal cord, demyelinating disease and vitamin-B deficiency are all theoretically possible. Electronically Signed   By: Nelson Chimes M.D.   On: 07/30/2018 17:31   Mr Lumbar Spine Wo Contrast  Result Date: 07/26/2018 CLINICAL DATA:  [place] previously with low back pain and left leg pain. EXAM: MRI LUMBAR SPINE WITHOUT CONTRAST TECHNIQUE: Multiplanar, multisequence MR imaging of the lumbar spine was performed. No intravenous contrast was administered. COMPARISON:  None. FINDINGS: Segmentation:  5 lumbar type vertebral bodies. Alignment: Mild curvature convex to the left. 2 mm retrolisthesis L1-2. 2 mm anterolisthesis L3-4. Vertebrae:  No fracture or primary bone lesion. Conus medullaris and cauda equina: Conus extends to the L1 level. Conus and cauda equina appear normal. Paraspinal and other soft tissues: Negative Disc levels: T12-L1: Mild noncompressive disc bulge. L1-2: 2 mm retrolisthesis. Mild bulging of the disc. No compressive stenosis. L2-3: Mild bulging of the disc.  No compressive stenosis. L3-4: Bilateral facet arthropathy with some edema. 2 mm of anterolisthesis because of this. Bulging of the disc. Stenosis of the lateral recesses left more than right. Some potential for neural compression, particularly on the left. Mild left foraminal to extraforaminal encroachment by bulging disc material could focally irritate the left L3 nerve. L4-5: Endplate osteophytes and bulging of the disc. Bilateral facet and ligamentous hypertrophy. Stenosis of both lateral recesses that could possibly cause neural compression on either or both sides. The facet arthritis could also contribute to back pain. L5-S1: Mild bulging of the disc. Mild facet  and ligamentous hypertrophy. Mild narrowing of the left subarticular lateral recess but without visible compression of the transitioning S1 nerve. IMPRESSION: No evidence of fracture related to a recent fall. L3-4: Bilateral facet arthropathy allowing 2 mm of anterolisthesis. Some edema of the facet joints. Bulging of the disc more towards the left. Narrowing of the lateral recesses left more than right and mild left foraminal to extraforaminal encroachment by bulging disc material. Findings at this level could certainly be associated with back pain. There be some potential for neural compression or irritation particularly in the left lateral recess and left foraminal region. L4-5: Endplate osteophytes and bulging of the disc. Facet degeneration and hypertrophy. Stenosis of both subarticular lateral recesses. Neural compression could occur on either or both sides. The facet arthritis could contribute to back pain. L5-S1: Bulging of the disc. Mild facet hypertrophy on the left. Mild narrowing of the subarticular lateral recess but without visible compression of the transitioning left S1 nerve Electronically Signed   By: Nelson Chimes M.D.   On: 07/26/2018 18:22   Mr Cervical Spine W Or Wo Contrast  Result Date: 07/31/2018 CLINICAL DATA:  Myelopathy. Pain after a fall earlier in the month. Left leg weakness and numbness. Urinary incontinence. History of lupus. EXAM: MRI CERVICAL SPINE WITHOUT AND WITH CONTRAST TECHNIQUE: Multiplanar and multiecho pulse sequences of the cervical spine, to include the craniocervical junction and cervicothoracic junction, were obtained without and with intravenous contrast. CONTRAST:  5 mL Gadavist COMPARISON:  Thoracic spine MRI 07/30/2018 FINDINGS: Intermittent moderate motion artifact on axial sequences. Alignment: Cervical spine straightening.  No listhesis. Vertebrae: No fracture, suspicious osseous lesion, or significant marrow edema. Cord: Multifocal T2 hyperintensity in the  cervical spinal cord including a 12 mm enhancing lesion in the left hemicord at C3-4 and a nearly 2 cm minimally enhancing lesion in the right and central aspects of the cord at C7. Lower level nonenhancing T2 hyperintensity elsewhere in the cord including in the upper thoracic spine and on the right at C2. Posterior Fossa, vertebral arteries, paraspinal tissues: Microcystic changes in the parotid glands as reported on brain MRI. Preserved vertebral artery flow voids. Disc levels: C2-3: Negative. C3-4: Negative. C4-5: Mild disc space narrowing. Mild disc bulging and minimal uncovertebral spurring without significant stenosis. C5-6: Mild disc space narrowing. Mild disc bulging and uncovertebral spurring without significant stenosis. C6-7: Moderate disc space narrowing. Disc bulging, a small left posterolateral disc protrusion, and uncovertebral spurring result in borderline to mild right and mild-to-moderate left neural foraminal stenosis without significant spinal stenosis. C7-T1: Shallow right paracentral disc protrusion without stenosis. IMPRESSION: 1. Multifocal T2 signal abnormality in the cervical spinal cord including enhancing lesions at C3-4 and C7. The appearance is nonspecific, however lupus myelitis, other inflammatory or infectious myelitis, and demyelinating disease are considerations. Tumor and ischemia are felt to be unlikely. 2. Cervical disc degeneration greatest at C6-7 where there is mild-to-moderate left neural foraminal stenosis. No significant spinal stenosis. Electronically Signed   By: Logan Bores M.D.   On: 07/31/2018 20:52   US Arterial Abi (screening Lower Extremity)  Result Date: 07/22/2018 CLINICAL DATA:  Cold left leg.  Left lower extremity pain. EXAM: NONINVASIVE PHYSIOLOGIC VASCULAR STUDY OF BILATERAL LOWER EXTREMITIES TECHNIQUE: Evaluation of both lower extremities were performed at rest, including calculation of ankle-brachial indices with single level Doppler, pressure and  pulse volume recording. COMPARISON:  None. FINDINGS: Right ABI:  1.02 Left ABI:  0.96 Right Lower Extremity:  Normal arterial waveforms at the ankle. Left Lower Extremity:  Normal arterial waveforms at the ankle. 1.0-1.4 Normal IMPRESSION: Normal resting ankle-brachial indices. Electronically Signed   By: Markus Daft M.D.   On: 07/22/2018 16:49   Vas [place] Lower Extremity Venous (dvt)  Result Date: 08/01/2018  Lower Venous Study Indications: Pain.  Performing Technologist: June Leap RDMS, RVT  Examination Guidelines: A complete evaluation includes B-mode imaging, spectral Doppler, color Doppler, and power Doppler as needed of all accessible portions of each vessel. Bilateral testing is considered an integral part of a complete examination. Limited examinations for reoccurring indications may be performed as noted.  Right Venous Findings: +---+---------------+---------+-----------+----------+-------+      Compressibility Phasicity Spontaneity Properties Summary  +---+---------------+---------+-----------+----------+-------+  CFV Full            Yes       Yes                             +---+---------------+---------+-----------+----------+-------+  Left Venous Findings: +---------+---------------+---------+-----------+----------+-------+            Compressibility Phasicity Spontaneity Properties Summary  +---------+---------------+---------+-----------+----------+-------+  CFV       Full            Yes       Yes                             +---------+---------------+---------+-----------+----------+-------+  SFJ       Full                                                      +---------+---------------+---------+-----------+----------+-------+  FV Prox   Full                                                      +---------+---------------+---------+-----------+----------+-------+  FV Mid    Full                                                      +---------+---------------+---------+-----------+----------+-------+   FV Distal Full                                                      +---------+---------------+---------+-----------+----------+-------+  PFV       Full                                                      +---------+---------------+---------+-----------+----------+-------+  POP       Full            Yes       Yes                             +---------+---------------+---------+-----------+----------+-------+  PTV       Full                                                      +---------+---------------+---------+-----------+----------+-------+  PERO      Full                                                      +---------+---------------+---------+-----------+----------+-------+  Gastroc   None                                             Acute    +---------+---------------+---------+-----------+----------+-------+    Summary: Right: No evidence of common femoral vein obstruction. Left: Findings consistent with acute deep vein thrombosis involving the left gastrocnemius vein. No cystic structure found in the popliteal fossa.  *See table(s) above for measurements and observations.    Preliminary       Phillips Climes M.D on 08/01/2018 at 11:53 AM  Between 7am to 7pm - Pager - [phone]  After 7pm go to [email or web address] - password [facility]  [facility] -  Office  [phone]

## 2018-08-01 NOTE — Progress Notes (Signed)
OT Cancellation Note  Patient Details Name: Susan Hodge MRN: [id number] DOB: 1951/04/19   Cancelled Treatment:    Reason Eval/Treat Not Completed: Patient not medically ready(Pt with + DVT. OTR to follow-up when medically appropriate.)   Darryl Nestle) Marsa Aris OTR/L Acute Rehabilitation Services Pager: [phone] Office: [phone]  Fredda Hammed 08/01/2018, 10:34 AM

## 2018-08-01 NOTE — Progress Notes (Signed)
ANTICOAGULATION CONSULT NOTE - Initial Consult  Pharmacy Consult for Lovenox Indication: DVT, left lower leg  Allergies  Allergen Reactions  . Amitriptyline Other (See Comments)  . Bee Venom Other (See Comments)  . Fexofenadine Other (See Comments)  . Furosemide Other (See Comments)  . Gabapentin Other (See Comments)  . Hydroxychloroquine Sulfate Other (See Comments)  . Morphine Other (See Comments)  . Nsaids   . Oxycodone Other (See Comments)    dizzy  . Topiramate Other (See Comments)    Patient Measurements: Height: 5' (152.4 cm) Weight: 125 lb 14.1 oz (57.1 kg) IBW/kg (Calculated) : 45.5 Lovenox Dosing Weight: 57 kg  Vital Signs: Temp: 98.9 F (37.2 C) (03/29 1201) Temp Source: Oral (03/29 1201) BP: 103/74 (03/29 1201) Pulse Rate: 71 (03/29 1201)  Labs: Recent Labs    07/31/18 1704 08/01/18 0242  HGB 12.4 11.9*  HCT 38.0 34.7*  PLT 200 189  CREATININE 0.97  --     Estimated Creatinine Clearance: 44.5 mL/min (by C-G formula based on SCr of 0.97 mg/dL).   Medical History: Past Medical History:  Diagnosis Date  . Chest pain   . Depression   . DJD (degenerative joint disease)   . FH: CVA (cerebrovascular accident)    father  . Fibromyalgia   . GERD (gastroesophageal reflux disease)   . Hypothyroidism   . Lupus ([place])   . Migraines   . Palpitations   . Status post cholecystectomy    appendectomy and bilaateral oophorectomy  . Tobacco use    Assessment:   68 yr old female to begin Lovenox for LLE DVT.      S/p LP last night ~10:30pm.  On high-dose Solumedrol x 5 days for myelitis.   Discussed with Dr. Landis Gandy.  No further spinal procedures anticipated.  Has been >12 hrs since LP.  Guidelines suggest waiting at least 6 hrs post-LP to begin full-dose Lovenox.  Goal of Therapy:  Anti-Xa level 0.6-1 units/ml 4hrs after LMWH dose given Monitor platelets by anticoagulation protocol: Yes   Plan:   Lovenox 60 mg (~1 mg/kg) SQ q12hrs.  Intermittent  CBC.  Follow anticoagulation plans.  Arty Baumgartner, [place] Pager: [phone] or phone: [phone] 08/01/2018,12:12 PM

## 2018-08-02 DIAGNOSIS — G0491 Myelitis, unspecified: Secondary | ICD-10-CM

## 2018-08-02 DIAGNOSIS — G8194 Hemiplegia, unspecified affecting left nondominant side: Secondary | ICD-10-CM

## 2018-08-02 LAB — HSV DNA BY PCR (REFERENCE LAB)
HSV 1 DNA: NEGATIVE
HSV 2 DNA: NEGATIVE

## 2018-08-02 LAB — BASIC METABOLIC PANEL
Anion gap: 10 (ref 5–15)
BUN: 27 mg/dL — ABNORMAL HIGH (ref 8–23)
CO2: 26 mmol/L (ref 22–32)
Calcium: 9.3 mg/dL (ref 8.9–10.3)
Chloride: 102 mmol/L (ref 98–111)
Creatinine, Ser: 0.91 mg/dL (ref 0.44–1.00)
GFR calc Af Amer: >60 mL/min (ref >60)
GFR calc non Af Amer: >60 mL/min (ref >60)
Glucose, Bld: 133 mg/dL — ABNORMAL HIGH (ref 70–99)
Potassium: 4.2 mmol/L (ref 3.5–5.1)
Sodium: 138 mmol/L (ref 135–145)

## 2018-08-02 LAB — CBC
HCT: 38.1 % (ref 36.0–46.0)
Hemoglobin: 12.9 g/dL (ref 12.0–15.0)
MCH: 31.3 pg (ref 26.0–34.0)
MCHC: 33.9 g/dL (ref 30.0–36.0)
MCV: 92.5 fL (ref 80.0–100.0)
Platelets: 203 10*3/uL (ref 150–400)
RBC: 4.12 MIL/uL (ref 3.87–5.11)
RDW: 12.8 % (ref 11.5–15.5)
WBC: 9.6 10*3/uL (ref 4.0–10.5)
nRBC: 0 % (ref 0.0–0.2)

## 2018-08-02 LAB — ANGIOTENSIN CONVERTING ENZYME: Angiotensin-Converting Enzyme: 40 U/L (ref 14–82)

## 2018-08-02 LAB — PATHOLOGIST SMEAR REVIEW

## 2018-08-02 LAB — ANGIOTENSIN CONVERTING ENZYME, CSF: Angio Convert Enzyme: <1.5 U/L (ref <2.9)

## 2018-08-02 NOTE — Evaluation (Signed)
Physical Therapy Evaluation Patient Details Name: Susan Hodge MRN: [id number] DOB: [birth year] Today's Date: 08/02/2018   History of Present Illness   Susan Hodge is a 68 y.o. female with medical history significant of GERD, hypothyroidism, migraines, fibromyalgia, herpes zoster, lupus presenting to the hospital for evaluation of lower extremity weakness.  Patient states she had a fall over 2 weeks ago and fell on her buttocks. MRI revealed areas of myelitis at C3-4 and C7 and changes at T2-11.  Clinical Impression  Pt admitted with above. Pt indep PTA, 2 weeks ago, and now has significant L LE weakness since her fall 2 weeks ago. Pt with grossly 1/5 L LE strength with exception of quads at 3-/5. Pt was driving and running errands. Pt now requires assist for all ADLs and transfers. Pt to strongly benefit from CIR Upon d/c to achieve safe mod I level of function. Pt does have supportive family that can provide 24/7 assist. Discussed minimizing that to 1-2 people due to COVID-19 social distancing recommendations instead of the 7 family members that typically assist. Acute PT to cont to follow.    Follow Up Recommendations CIR    Equipment Recommendations  None recommended by PT    Recommendations for Other Services Rehab consult     Precautions / Restrictions Precautions Precautions: Fall Precaution Comments: L LE numbness, weakness. Restrictions Weight Bearing Restrictions: No      Mobility  Bed Mobility Overal bed mobility: Needs Assistance Bed Mobility: Supine to Sit     Supine to sit: Min guard     General bed mobility comments: HOB elevated, pt unable to move L LE to EOB without using UEs to assist LE off EOB  Transfers Overall transfer level: Needs assistance Equipment used: Rolling walker (2 wheeled) Transfers: Risk manager;Sit to/from Stand Sit to Stand: Min assist Stand pivot transfers: Min assist       General transfer comment: pt unable to bear  full wight on L LE due to weakness and buckling. Pt pivots on ball of R LE and "dragged" L LE. Pt unable to clear L foot without maxA to advance LE. pt with good walker management. Pt unable to advance R LE/clear foot due to L knee buckling with L LE in stance phase  Ambulation/Gait             General Gait Details: unable at this time  Stairs            Wheelchair Mobility    Modified Rankin (Stroke Patients Only)       Balance Overall balance assessment: Needs assistance Sitting-balance support: Feet supported;No upper extremity supported Sitting balance-Leahy Scale: Good     Standing balance support: Bilateral upper extremity supported Standing balance-Leahy Scale: Poor Standing balance comment: dependent on UEs and RW                             Pertinent Vitals/Pain Pain Assessment: 0-10 Pain Score: 8  Pain Location: thoracic spine down L side Pain Descriptors / Indicators: Pins and needles(numbness) Pain Intervention(s): Monitored during session    Home Living Family/patient expects to be discharged to:: Private residence Living Arrangements: Alone Available Help at Discharge: Family;Available 24 hours/day Type of Home: House Home Access: Stairs to enter Entrance Stairs-Rails: Left Entrance Stairs-Number of Steps: 2 Home Layout: One level Home Equipment: Tub bench;None;Wheelchair - manual Additional Comments: family providing 24/l7    Prior Function Level of Independence: Independent  Comments: was indep until 2 weeks when she fell 2 weeks ago, now family is with her 24/7 providing assist, was driving and going to grocery store     [place] Hand: Left    Extremity/Trunk Assessment   Upper Extremity Assessment Upper Extremity Assessment: Overall WFL for tasks assessed    Lower Extremity Assessment Lower Extremity Assessment: LLE deficits/detail LLE Deficits / Details: grossly 1/5 with exceptions of  quad at 3-/5, pt with report of diminished sensation compared to the R LLE Sensation: decreased light touch    Cervical / Trunk Assessment Cervical / Trunk Assessment: Other exceptions Cervical / Trunk Exceptions: spinal changes  Communication   Communication: HOH(deaf in R ear)  Cognition Arousal/Alertness: Awake/alert Behavior During Therapy: WFL for tasks assessed/performed Overall Cognitive Status: Within Functional Limits for tasks assessed                                        General Comments General comments (skin integrity, edema, etc.): VSS    Exercises     Assessment/Plan    PT Assessment Patient needs continued PT services  PT Problem List Decreased strength;Decreased activity tolerance;Decreased balance;Decreased mobility;Decreased coordination;Decreased cognition;Decreased knowledge of use of DME;Pain       PT Treatment Interventions DME instruction;Stair training;Gait training;Functional mobility training;Therapeutic activities;Therapeutic exercise;Balance training;Neuromuscular re-education    PT Goals (Current goals can be found in the Care Plan section)  Acute Rehab PT Goals Patient Stated Goal: get back to indep PT Goal Formulation: With patient Time For Goal Achievement: 08/16/18 Potential to Achieve Goals: Good    Frequency Min 4X/week   Barriers to discharge        Co-evaluation               AM-PAC PT "6 Clicks" Mobility  Outcome Measure Help needed turning from your back to your side while in a flat bed without using bedrails?: A Little Help needed moving from lying on your back to sitting on the side of a flat bed without using bedrails?: A Little Help needed moving to and from a bed to a chair (including a wheelchair)?: A Lot Help needed standing up from a chair using your arms (e.g., wheelchair or bedside chair)?: A Lot Help needed to walk in hospital room?: A Lot Help needed climbing 3-5 steps with a railing? : A  Lot 6 Click Score: 14    End of Session Equipment Utilized During Treatment: Gait belt Activity Tolerance: Patient tolerated treatment well Patient left: in chair;with call bell/phone within reach;with nursing/sitter in room Nurse Communication: Mobility status(use BS) PT Visit Diagnosis: Unsteadiness on feet (R26.81);Difficulty in walking, not elsewhere classified (R26.2);Pain Pain - Right/Left: Left Pain - part of body: (flank, hip, LLE)    Time: 0814-4818 PT Time Calculation (min) (ACUTE ONLY): 32 min   Charges:   PT Evaluation $PT Eval Moderate Complexity: 1 Mod PT Treatments $Therapeutic Activity: 8-22 mins        Kittie Plater, PT, DPT Acute Rehabilitation Services Pager #: [phone] Office #: [phone]   Berline Lopes 08/02/2018, 9:03 AM

## 2018-08-02 NOTE — Progress Notes (Addendum)
NEUROLOGY PROGRESS NOTE  Subjective: Still continues to feel weak in both legs with the left greater than right.  No complaints of back pain or hip pain today.  Exam: Vitals:   08/02/18 0323 08/02/18 0745  BP: 119/70 104/63  Pulse: 69 66  Resp: 17 18  Temp: 98 F (36.7 C) 97.8 F (36.6 C)  SpO2: 98% 100%    Physical Exam   HEENT-  Normocephalic, no lesions, without obvious abnormality.  Normal external eye and conjunctiva.   Abdomen- All 4 quadrants palpated and nontender Extremities- Warm, dry and intact Musculoskeletal-no joint tenderness, deformity or swelling Skin-warm and dry, no hyperpigmentation, vitiligo, or suspicious lesions    Neuro:  Mental Status: Alert, oriented, thought content appropriate.  Speech fluent without evidence of aphasia.  Able to follow 3 step commands without difficulty. Cranial Nerves: II:  Visual fields grossly normal,  III,IV, VI: ptosis not present, extra-ocular motions intact bilaterally pupils equal, round, reactive to light and accommodation V,VII: smile symmetric, facial light touch sensation normal bilaterally VIII: hearing normal bilaterally IX,X: Palate rises midline XI: bilateral shoulder shrug XII: midline tongue extension Motor: Right upper extremity   5/5     Right lower extremity    Left upper extremity   5/5 Left lower extremity shows 1/5 and quadricep with attempts to lift leg, 2/5 knee flex however extension and locking out knee shows 4/5, patient has no dorsiflexion but does have 4/5 plantarflexion. Tone and bulk:normal tone throughout; no atrophy noted Sensory: Light touch sensation is decreased on the left leg compared to the right leg otherwise intact throughout upper extremities Deep Tendon Reflexes: 2+ and symmetric throughout upper extremities.  2+ and brisk right knee and 3+ and brisk on left knee jerk I did not get any significant ankle jerk Plantars: Mute bilaterally Cerebellar: normal finger-to-nose,      Medications:  Scheduled: . enoxaparin (LOVENOX) injection  60 mg Subcutaneous Q12H  . loratadine  10 mg Oral Daily  . pantoprazole  40 mg Oral BID  . pregabalin  50 mg Oral TID  . traMADol  50 mg Oral Once  . valproic acid  250 mg Oral BID  . vitamin B-12  250 mcg Oral Daily  . Vitamin D (Ergocalciferol)  50,000 Units Oral Q Mon  . vitamin E  1,000 Units Oral Daily    Pertinent Labs/Diagnostics: -ACE.-Pending -HSV PCR-pending -Anti-aquaporin 4antibodies-pending -VZV PCR-pending -Copper - HSV antibody IgG/IgM CSF-pending - CSF IgG-pending -B12 1400  Results for MALICIA, BLASDEL (MRN [id number]) as of 08/02/2018 09:36  Ref. Range 07/31/2018 21:21 07/31/2018 21:22  Appearance, CSF Latest Ref Range: CLEAR   CLEAR  Glucose, CSF Latest Ref Range: 40 - 70 mg/dL 64   RBC Count, CSF Latest Ref Range: 0 /cu mm  203 (H)  WBC, CSF Latest Ref Range: 0 - 5 /cu mm  14 (HH)  Segmented Neutrophils-CSF Latest Ref Range: 0 - 6 %  3  Lymphs, CSF Latest Ref Range: 40 - 80 %  83 (H)  Monocyte-Macrophage-Spinal Fluid Latest Ref Range: 15 - 45 %  14 (L)  Eosinophils, CSF Latest Ref Range: 0 - 1 %  0  Color, CSF Latest Ref Range: COLORLESS   COLORLESS  Supernatant Unknown  NOT INDICATED  Total  Protein, CSF Latest Ref Range: 15 - 45 mg/dL 62 (H)   Tube # Unknown  1     Mr Jeri Cos And Wo Contrast  Result Date: 07/31/2018 IMPRESSION: 1. No acute intracranial abnormality. 2. Chronic  right MCA infarct. 3. Cerebral white matter T2 signal changes, nonspecific but compatible with mild chronic small vessel ischemic disease. 4. Diffuse nodularity/microcystic changes in the parotid gland suggestive of chronic inflammation or autoimmune disease. Electronically Signed   By: Logan Bores M.D.   On: 07/31/2018 20:13   Mr Cervical Spine W Or Wo Contrast  Result Date: 07/31/2018  IMPRESSION: 1. Multifocal T2 signal abnormality in the cervical spinal cord including enhancing lesions at C3-4 and C7. The  appearance is nonspecific, however lupus myelitis, other inflammatory or infectious myelitis, and demyelinating disease are considerations. Tumor and ischemia are felt to be unlikely. 2. Cervical disc degeneration greatest at C6-7 where there is mild-to-moderate left neural foraminal stenosis. No significant spinal stenosis. Electronically Signed   By: Logan Bores M.D.   On: 07/31/2018 20:52   Vas [place] Lower Extremity Venous (dvt)  Result Date: 08/01/2018 Summary: Right: No evidence of common femoral vein obstruction. Left: Findings consistent with acute deep vein thrombosis involving the left gastrocnemius vein. No cystic structure found in the popliteal fossa.  *See table(s) above for measurements and observations.    Preliminary      Etta Quill PA-C [facility] [phone]   Assessment: 68 year old female with a PMHx of SLE and herpes zoster. She presented to the emergency department with progressive weakness in her left leg and inability to ambulate. Abnormal Cspine and Tspine imaging revealed patchy subacute enhancing lesions within her cervical cord as well as chronic lesions in both her cervical and thoracic spinal cord.  1. MRI brain without demyelinating lesions. An old ischemic stroke is noted.   2. MRI of cervical and thoracic spine: Myelitis at C3-4 and C7 (enhancing) with multiple areas of longitudinal signal changes suggestive of prior myelitis/demyelinationThere are patchy lesions noted in T4-5 and T9-11.DDX includes neuromyelitis optica and lupus myelitis. MS felt to be less likely given lack of demyelinating lesions on MRI brain. VZV or HSV myelitis are possible but less likely, given chronic lesions in addition to the subacute lesions seen on MRI cervical.  3. LP showed elevated protein of 62 and mildly elevated white cells of 14. These findings are more consistent with inflammation due to an autoimmune process than an infectious transverse myelitis. The MRI appearance  of the lesions is also more consistent with an inflammatory etiology. 4. Added ACE, VZV PCR, HSV PCR andaquaporin 4antibodies to CSF labs, which are all pending.    Recommendations: 1. F/u on VZV PCR, HSV PCR andaquaporin 4antibodies 2. Added ACE to CSF and serum 3. IV solumedrol 1g daily x 5days- today will be day 3 4. Start patient on Protonix during steroid use 5. Continue B12 and Calcium supplements 6. Will check copper level 7. PT/OT; may need inpt rehab for safe d/c plan 8. Will need outpatient neurology follow up with Dr. Felecia Shelling   Electronically signed: Dr. Kerney Elbe 08/02/2018, 9:24 AM

## 2018-08-02 NOTE — Consult Note (Signed)
Physical Medicine and Rehabilitation Consult Reason for Consult:Decreased functional mobility Referring Physician: triad   HPI: Susan Hodge is a 68 y.o.right handed female with history of hypothyroidism, fibromyalgia, lupus. Per chart review patient lives alone. One level home to steps to entry. Independent prior to admission.Good support of local family. Presented 07/31/2018 with recent fall 2 weeks ago landing on her buttocks and noted progressive lower extremity weakness. Denied any bowel or bladder disturbances. MRI the brain negative for acute changes. CT lumbar thoracic and cervical spine showed multifocal T2 signal abnormality in the cervical spinal cord including enhancing lesion at C3-4-5 and C7. Cervical disc degeneration C6-7.lumbar puncture completed WBC 14, lymphs 83 for results pending. venous Doppler studies lower extremity showed acute DVT left gastrocnemius vein. Patient placed on therapeutic dose of Lovenox. Neurology consulted and workup for myelitis at C3-4 and C7. IV Solu-Medrol 5 days. Therapy evaluations completed recommendations of physical medicine rehabilitation consult.   Review of Systems  Constitutional: Negative for chills and fever.  HENT: Positive for hearing loss.   Eyes: Negative for blurred vision and double vision.  Respiratory: Negative for shortness of breath.   Cardiovascular: Positive for palpitations. Negative for chest pain and leg swelling.  Gastrointestinal: Positive for constipation. Negative for heartburn, nausea and vomiting.       GERD   Genitourinary: Negative for dysuria, flank pain and hematuria.  Musculoskeletal: Positive for myalgias.  Neurological: Positive for weakness and headaches.  Psychiatric/Behavioral: Positive for depression.  All other systems reviewed and are negative.  Past Medical History:  Diagnosis Date   Chest pain    Depression    DJD (degenerative joint disease)    FH: CVA (cerebrovascular accident)     father   Fibromyalgia    GERD (gastroesophageal reflux disease)    Hypothyroidism    Lupus (HCC)    Migraines    Palpitations    Status post cholecystectomy    appendectomy and bilaateral oophorectomy   Tobacco use    Past Surgical History:  Procedure Laterality Date   BILATERAL OOPHORECTOMY     CARDIAC CATHETERIZATION     CHOLECYSTECTOMY     Family History  Problem Relation Age of Onset   Stroke Father    Social History:  reports that she has been smoking. She has never used smokeless tobacco. She reports that she does not drink alcohol or use drugs. Allergies:  Allergies  Allergen Reactions   Amitriptyline Other (See Comments)   Bee Venom Other (See Comments)   Fexofenadine Other (See Comments)   Furosemide Other (See Comments)   Gabapentin Other (See Comments)   Hydroxychloroquine Sulfate Other (See Comments)   Morphine Other (See Comments)   Nsaids    Oxycodone Other (See Comments)    dizzy   Topiramate Other (See Comments)   Medications Prior to Admission  Medication Sig Dispense Refill   ALPRAZolam (XANAX) 0.5 MG tablet Take 0.5 mg by mouth 3 (three) times daily as needed for anxiety or sleep.      Cyanocobalamin (VITAMIN B-12 PO) Take 2 tablets by mouth daily.     diclofenac (VOLTAREN) 75 MG EC tablet Take 75 mg by mouth 2 (two) times daily as needed for mild pain.      hydrOXYzine (ATARAX/VISTARIL) 10 MG tablet Take 10 mg by mouth every 8 (eight) hours as needed. For itching     loratadine (CLARITIN) 10 MG tablet Take 10 mg by mouth daily.     ondansetron (ZOFRAN) 4  MG tablet Take 2 mg by mouth every 8 (eight) hours as needed. for nausea     oxyCODONE-acetaminophen (PERCOCET) 7.5-325 MG tablet Take 1 tablet by mouth every 8 (eight) hours as needed.     predniSONE (STERAPRED UNI-PAK 21 TAB) 10 MG (21) TBPK tablet Take by mouth daily. Take 6 tabs by mouth daily  for 2 days, then 5 tabs for 2 days, then 4 tabs for 2 days...continue  to taper 42 tablet 0   pregabalin (LYRICA) 50 MG capsule Take 50 mg by mouth 3 (three) times daily.     valproic acid (DEPAKENE) 250 MG capsule Take 250 mg by mouth 2 (two) times daily.     Vitamin D, Ergocalciferol, (DRISDOL) 1.25 MG (50000 UT) CAPS capsule Take 1 capsule by mouth every Monday.      vitamin E 1000 UNIT capsule Take 1,000 Units by mouth daily.     valACYclovir (VALTREX) 1000 MG tablet Take 1,000 mg by mouth 2 (two) times daily.      Home: Home Living Family/patient expects to be discharged to:: Private residence Living Arrangements: Alone Available Help at Discharge: Family, Available 24 hours/day Type of Home: House Home Access: Stairs to enter [employer] of Steps: 2 Entrance Stairs-Rails: Left Home Layout: One level Bathroom Shower/Tub: Chiropodist: Standard Home Equipment: Tub bench, None, Wheelchair - manual Additional Comments: family providing 24/l7  Functional History: Prior Function Level of Independence: Independent Comments: was indep until 2 weeks when she fell 2 weeks ago, now family is with her 24/7 providing assist, was driving and going to grocery store Functional Status:  Mobility: Bed Mobility Overal bed mobility: Needs Assistance Bed Mobility: Supine to Sit Supine to sit: Min guard General bed mobility comments: HOB elevated, pt unable to move L LE to EOB without using UEs to assist LE off EOB Transfers Overall transfer level: Needs assistance Equipment used: Rolling walker (2 wheeled) Transfers: Stand Pivot Transfers, Sit to/from Stand Sit to Stand: Min assist Stand pivot transfers: Min assist General transfer comment: pt unable to bear full wight on L LE due to weakness and buckling. Pt pivots on ball of R LE and "dragged" L LE. Pt unable to clear L foot without maxA to advance LE. pt with good walker management. Pt unable to advance R LE/clear foot due to L knee buckling with L LE in stance  phase Ambulation/Gait General Gait Details: unable at this time    ADL:    Cognition: Cognition Overall Cognitive Status: Within Functional Limits for tasks assessed Orientation Level: Oriented X4 Cognition Arousal/Alertness: Awake/alert Behavior During Therapy: WFL for tasks assessed/performed Overall Cognitive Status: Within Functional Limits for tasks assessed  Blood pressure 104/63, pulse 66, temperature 97.8 F (36.6 C), temperature source Oral, resp. rate 18, height 5' (1.524 m), weight 57.1 kg, SpO2 100 %. Physical Exam  Constitutional: She appears well-developed and well-nourished.  HENT:  Head: Normocephalic and atraumatic.  Eyes: Pupils are equal, round, and reactive to light. EOM are normal.  Neck: Normal range of motion. No thyromegaly present.  Cardiovascular: Normal rate and regular rhythm.  Respiratory: Effort normal and breath sounds normal.  GI: Soft. She exhibits no distension. There is no abdominal tenderness.  Musculoskeletal:        General: No edema.     Comments: Cognitively appropriate but tangential. UE 4+/5 bilaterally, RLE: 4/5 prox to distal. LLE: 2/5 HF, KE and 1-2 ADF/PF. Sensory 2/2 RUE and RLE. LUE 1+/2, LLE 1+/2, decreased LT in saddle  area/buttocks also, left more than right.  DTr's 1+ in all 4's.   Neurological:  Alert. Sitting up in bed in no acute distress. Alert and oriented 3  Skin: Skin is warm.  Psychiatric: She has a normal mood and affect. Her behavior is normal. Judgment and thought content normal.    Results for orders placed or performed during the hospital encounter of 07/31/18 (from the past 24 hour(s))  Vitamin B12     Status: Abnormal   Collection Time: 08/01/18  1:41 PM  Result Value Ref Range   Vitamin B-12 1,457 (H) 180 - 914 pg/mL  CBC     Status: None   Collection Time: 08/02/18  7:25 AM  Result Value Ref Range   WBC 9.6 4.0 - 10.5 K/uL   RBC 4.12 3.87 - 5.11 MIL/uL   Hemoglobin 12.9 12.0 - 15.0 g/dL   HCT 38.1  36.0 - 46.0 %   MCV 92.5 80.0 - 100.0 fL   MCH 31.3 26.0 - 34.0 pg   MCHC 33.9 30.0 - 36.0 g/dL   RDW 12.8 11.5 - 15.5 %   Platelets 203 150 - 400 K/uL   nRBC 0.0 0.0 - 0.2 %  Basic metabolic panel     Status: Abnormal   Collection Time: 08/02/18  7:25 AM  Result Value Ref Range   Sodium 138 135 - 145 mmol/L   Potassium 4.2 3.5 - 5.1 mmol/L   Chloride 102 98 - 111 mmol/L   CO2 26 22 - 32 mmol/L   Glucose, Bld 133 (H) 70 - 99 mg/dL   BUN 27 (H) 8 - 23 mg/dL   Creatinine, Ser 0.91 0.44 - 1.00 mg/dL   Calcium 9.3 8.9 - 10.3 mg/dL   GFR calc non Af Amer >60 >60 mL/min   GFR calc Af Amer >60 >60 mL/min   Anion gap 10 5 - 15   Mr Brain W And Wo Contrast  Result Date: 07/31/2018 CLINICAL DATA:  Myelopathy. Left leg weakness and numbness. Urinary incontinence. EXAM: MRI HEAD WITHOUT AND WITH CONTRAST TECHNIQUE: Multiplanar, multiecho pulse sequences of the brain and surrounding structures were obtained without and with intravenous contrast. CONTRAST:  5 mL Gadavist COMPARISON:  None. FINDINGS: Brain: No acute infarct, mass, midline shift, or extra-axial fluid collection is identified. There is a small to moderate-sized chronic right MCA infarct involving the frontal operculum and insula with a small amount of hemosiderin staining. Small foci of T2 hyperintensity scattered elsewhere in the cerebral white matter bilaterally, most notably in the left corona radiata, are at most mildly advanced for age. No abnormal brain parenchymal or meningeal enhancement is identified. There is mild global cerebral atrophy. Vascular: Major intracranial vascular flow voids are preserved. Skull and upper cervical spine: Unremarkable bone marrow signal. Sinuses/Orbits: Unremarkable orbits. Mild circumferential mucosal thickening and moderate volume fluid in the left maxillary sinus which appears small. Trace right mastoid effusion. Other: Diffuse nodularity/microcystic changes throughout both parotid glands. IMPRESSION:  1. No acute intracranial abnormality. 2. Chronic right MCA infarct. 3. Cerebral white matter T2 signal changes, nonspecific but compatible with mild chronic small vessel ischemic disease. 4. Diffuse nodularity/microcystic changes in the parotid gland suggestive of chronic inflammation or autoimmune disease. Electronically Signed   By: Logan Bores M.D.   On: 07/31/2018 20:13   Mr Cervical Spine W Or Wo Contrast  Result Date: 07/31/2018 CLINICAL DATA:  Myelopathy. Pain after a fall earlier in the month. Left leg weakness and numbness. Urinary incontinence. History of lupus.  EXAM: MRI CERVICAL SPINE WITHOUT AND WITH CONTRAST TECHNIQUE: Multiplanar and multiecho pulse sequences of the cervical spine, to include the craniocervical junction and cervicothoracic junction, were obtained without and with intravenous contrast. CONTRAST:  5 mL Gadavist COMPARISON:  Thoracic spine MRI 07/30/2018 FINDINGS: Intermittent moderate motion artifact on axial sequences. Alignment: Cervical spine straightening.  No listhesis. Vertebrae: No fracture, suspicious osseous lesion, or significant marrow edema. Cord: Multifocal T2 hyperintensity in the cervical spinal cord including a 12 mm enhancing lesion in the left hemicord at C3-4 and a nearly 2 cm minimally enhancing lesion in the right and central aspects of the cord at C7. Lower level nonenhancing T2 hyperintensity elsewhere in the cord including in the upper thoracic spine and on the right at C2. Posterior Fossa, vertebral arteries, paraspinal tissues: Microcystic changes in the parotid glands as reported on brain MRI. Preserved vertebral artery flow voids. Disc levels: C2-3: Negative. C3-4: Negative. C4-5: Mild disc space narrowing. Mild disc bulging and minimal uncovertebral spurring without significant stenosis. C5-6: Mild disc space narrowing. Mild disc bulging and uncovertebral spurring without significant stenosis. C6-7: Moderate disc space narrowing. Disc bulging, a small  left posterolateral disc protrusion, and uncovertebral spurring result in borderline to mild right and mild-to-moderate left neural foraminal stenosis without significant spinal stenosis. C7-T1: Shallow right paracentral disc protrusion without stenosis. IMPRESSION: 1. Multifocal T2 signal abnormality in the cervical spinal cord including enhancing lesions at C3-4 and C7. The appearance is nonspecific, however lupus myelitis, other inflammatory or infectious myelitis, and demyelinating disease are considerations. Tumor and ischemia are felt to be unlikely. 2. Cervical disc degeneration greatest at C6-7 where there is mild-to-moderate left neural foraminal stenosis. No significant spinal stenosis. Electronically Signed   By: Logan Bores M.D.   On: 07/31/2018 20:52   Vas [place] Lower Extremity Venous (dvt)  Result Date: 08/01/2018  Lower Venous Study Indications: Pain.  Performing Technologist: June Leap RDMS, RVT  Examination Guidelines: A complete evaluation includes B-mode imaging, spectral Doppler, color Doppler, and power Doppler as needed of all accessible portions of each vessel. Bilateral testing is considered an integral part of a complete examination. Limited examinations for reoccurring indications may be performed as noted.  Right Venous Findings: +---+---------------+---------+-----------+----------+-------+      Compressibility Phasicity Spontaneity Properties Summary  +---+---------------+---------+-----------+----------+-------+  CFV Full            Yes       Yes                             +---+---------------+---------+-----------+----------+-------+  Left Venous Findings: +---------+---------------+---------+-----------+----------+-------+            Compressibility Phasicity Spontaneity Properties Summary  +---------+---------------+---------+-----------+----------+-------+  CFV       Full            Yes       Yes                              +---------+---------------+---------+-----------+----------+-------+  SFJ       Full                                                      +---------+---------------+---------+-----------+----------+-------+  FV Prox   Full                                                      +---------+---------------+---------+-----------+----------+-------+  FV Mid    Full                                                      +---------+---------------+---------+-----------+----------+-------+  FV Distal Full                                                      +---------+---------------+---------+-----------+----------+-------+  PFV       Full                                                      +---------+---------------+---------+-----------+----------+-------+  POP       Full            Yes       Yes                             +---------+---------------+---------+-----------+----------+-------+  PTV       Full                                                      +---------+---------------+---------+-----------+----------+-------+  PERO      Full                                                      +---------+---------------+---------+-----------+----------+-------+  Gastroc   None                                             Acute    +---------+---------------+---------+-----------+----------+-------+    Summary: Right: No evidence of common femoral vein obstruction. Left: Findings consistent with acute deep vein thrombosis involving the left gastrocnemius vein. No cystic structure found in the popliteal fossa.  *See table(s) above for measurements and observations.    Preliminary      Assessment/Plan: Diagnosis: C3-4, C7 myelopathy/melitis with left sided weakness and hemisensory deficits.  1. Does the need for close, 24 hr/day medical supervision in concert with the patient's rehab needs make it unreasonable for this patient to be served in a less intensive setting? Yes 2. Co-Morbidities requiring supervision/potential  complications: hypothyroid, FMS, lupus 3. Due to bladder management, bowel management, safety, skin/wound care, disease management, medication administration, pain management and patient education, does the patient require 24 hr/day rehab nursing? Yes 4. Does the patient require coordinated care of a physician, rehab nurse, PT (1-2 hrs/day, 5 days/week) and OT (1-2 hrs/day, 5 days/week) to address physical and functional deficits in the context of the above medical diagnosis(es)? Yes Addressing deficits in the following areas: balance, endurance, locomotion, strength, transferring, bowel/bladder control,  bathing, dressing, feeding, grooming, toileting and psychosocial support 5. Can the patient actively participate in an intensive therapy program of at least 3 hrs of therapy per day at least 5 days per week? Yes 6. The potential for patient to make measurable gains while on inpatient rehab is excellent 7. Anticipated functional outcomes upon discharge from inpatient rehab are modified independent  with PT, modified independent with OT, n/a with SLP. 8. Estimated rehab length of stay to reach the above functional goals is: 8-13 days 9. Anticipated D/C setting: Home 10. Anticipated post D/C treatments: [place] therapy 11. Overall Rehab/Functional Prognosis: excellent  RECOMMENDATIONS: This patient's condition is appropriate for continued rehabilitative care in the following setting: CIR Patient has agreed to participate in recommended program. Yes Note that insurance prior authorization may be required for reimbursement for recommended care.  Comment: Rehab Admissions Coordinator to follow up.  Thanks,  Meredith Staggers, MD, Mellody Drown  I have personally performed a face to face diagnostic evaluation of this patient. Additionally, I have examined pertinent labs and radiographic images. I have reviewed and concur with the physician assistant's documentation above.    Lavon Paganini Angiulli,  PA-C 08/02/2018

## 2018-08-02 NOTE — Evaluation (Signed)
Occupational Therapy Evaluation Patient Details Name: Susan Hodge MRN: [id number] DOB: 11-Aug-1950 Today's Date: 08/02/2018    History of Present Illness  Susan Hodge is a 68 y.o. female with medical history significant of GERD, hypothyroidism, migraines, fibromyalgia, herpes zoster, lupus presenting to the hospital for evaluation of lower extremity weakness.  Patient states she had a fall over 2 weeks ago and fell on her buttocks. MRI revealed areas of myelitis at C3-4 and C7 and changes at T2-11.   Clinical Impression   This 68 y/o female presents with the above. At baseline pt is independent with ADL, iADL and functional mobility. Just prior to this admission, pt was requiring assist from family for mobility and for completion of ADL. Pt requiring minA for short distance functional mobility in room using RW (approx 10'). She currently requires setup/supervision for seated UB ADL, min-modA for LB ADL. Pt with good motivation to work with therapies to progress to PLOF. She will benefit from continued acute OT services and feel she is an excellent candidate for CIR level services at time of discharge to maximize her safety and independence with ADL and mobility. Will follow.     Follow Up Recommendations  CIR;Supervision/Assistance - 24 hour    Equipment Recommendations  Other (comment)(defer to next venue)    Recommendations for Other Services Rehab consult     Precautions / Restrictions Precautions Precautions: Fall Precaution Comments: L LE numbness, weakness. Restrictions Weight Bearing Restrictions: No      Mobility Bed Mobility               General bed mobility comments: OOB seated in front of sink with NT upon arrival  Transfers Overall transfer level: Needs assistance Equipment used: Rolling walker (2 wheeled) Transfers: Sit to/from Stand Sit to Stand: Min assist         General transfer comment: pt with good recall of UE placement prior to stand,  cues to reach back prior to sitting; minA to rise and steady at RW    Balance Overall balance assessment: Needs assistance Sitting-balance support: Feet supported;No upper extremity supported Sitting balance-Leahy Scale: Good     Standing balance support: Bilateral upper extremity supported Standing balance-Leahy Scale: Poor Standing balance comment: dependent on UEs and RW                           ADL either performed or assessed with clinical judgement   ADL Overall ADL's : Needs assistance/impaired Eating/Feeding: Set up;Sitting   Grooming: Set up;Sitting   Upper Body Bathing: Min guard;Sitting   Lower Body Bathing: Minimal assistance;Sit to/from stand   Upper Body Dressing : Set up;Min guard;Sitting Upper Body Dressing Details (indicate cue type and reason): donning new gown Lower Body Dressing: Minimal assistance;Sit to/from stand Lower Body Dressing Details (indicate cue type and reason): pt donning mesh underwear, she is able to physically lift LLE with her UEs to place into underwear, minA for standing balance; pt requires increased time to steady in standing and then is able to advance underwear over her hips using RUE, and with light minA Toilet Transfer: Minimal assistance;Ambulation Toilet Transfer Details (indicate cue type and reason): pt is able to peform mobility from chair in front of sink to recliner, hiking hips and coming up onto toes when stepping to assist with advancing LEs - increased difficulty backing up towards recliner vs going forward Toileting- Clothing Manipulation and Hygiene: Minimal assistance;Moderate assistance;Sit to/from stand  Functional mobility during ADLs: Minimal assistance;Rolling walker General ADL Comments: pt with increased LE weakness, decreased standing balance; pt just completed bathing with NT assist upon arrival - assisted with completion of ADL tasks and returned to recliner end of session     Vision          Perception     Praxis      Pertinent Vitals/Pain Pain Assessment: Faces Faces Pain Scale: Hurts little more Pain Location: thoracic spine down L side Pain Descriptors / Indicators: Pins and needles(numbness) Pain Intervention(s): Monitored during session;Repositioned     Hand Dominance Left   Extremity/Trunk Assessment Upper Extremity Assessment Upper Extremity Assessment: Overall WFL for tasks assessed   Lower Extremity Assessment Lower Extremity Assessment: Defer to PT evaluation   Cervical / Trunk Assessment Cervical / Trunk Assessment: Other exceptions Cervical / Trunk Exceptions: spinal changes   Communication Communication Communication: HOH(deaf in R ear)   Cognition Arousal/Alertness: Awake/alert Behavior During Therapy: WFL for tasks assessed/performed Overall Cognitive Status: Within Functional Limits for tasks assessed                                 General Comments: pt is chatty, needs redirection to task at hand but overall [facility]   General Comments       Exercises     Shoulder Instructions      Home Living Family/patient expects to be discharged to:: Private residence Living Arrangements: Alone Available Help at Discharge: Family;Available 24 hours/day Type of Home: House Home Access: Stairs to enter [employer] of Steps: 2 Entrance Stairs-Rails: Left Home Layout: One level     Bathroom Shower/Tub: Teacher, early years/pre: Standard     Home Equipment: Tub bench;None;Wheelchair - manual;Bedside commode   Additional Comments: family providing 24/7      Prior Functioning/Environment Level of Independence: Independent        Comments: was indep until 2 weeks when she fell 2 weeks ago, now family is with her 24/7 providing assist, was driving and going to grocery store        OT Problem List: Decreased strength;Decreased range of motion;Decreased activity tolerance;Impaired balance (sitting and/or  standing);Decreased knowledge of use of DME or AE;Impaired sensation      OT Treatment/Interventions: Self-care/ADL training;Energy conservation;Therapeutic exercise;Neuromuscular education;DME and/or AE instruction;Therapeutic activities;Patient/family education;Balance training    OT Goals(Current goals can be found in the care plan section) Acute Rehab OT Goals Patient Stated Goal: get back to indep OT Goal Formulation: With patient Time For Goal Achievement: 08/16/18 Potential to Achieve Goals: Good  OT Frequency: Min 3X/week   Barriers to D/C:            Co-evaluation              AM-PAC OT "6 Clicks" Daily Activity     Outcome Measure Help from another person eating meals?: None Help from another person taking care of personal grooming?: A Little Help from another person toileting, which includes using toliet, bedpan, or urinal?: A Lot Help from another person bathing (including washing, rinsing, drying)?: A Lot Help from another person to put on and taking off regular upper body clothing?: None Help from another person to put on and taking off regular lower body clothing?: A Lot 6 Click Score: 17   End of Session Equipment Utilized During Treatment: Gait belt;Rolling walker Nurse Communication: Mobility status  Activity Tolerance: Patient tolerated treatment well Patient  left: in chair;with call bell/phone within reach  OT Visit Diagnosis: Unsteadiness on feet (R26.81);Other abnormalities of gait and mobility (R26.89);Muscle weakness (generalized) (M62.81)                Time: 9390-3009 OT Time Calculation (min): 22 min Charges:  OT General Charges $OT Visit: 1 Visit OT Evaluation $OT Eval Moderate Complexity: 1 Mod  Lou Cal, OT [employer] Pager [phone] Office [phone]   Raymondo Band 08/02/2018, 1:08 PM

## 2018-08-02 NOTE — Progress Notes (Signed)
PROGRESS NOTE                                                                                                                                                                                                             Patient Demographics:    Susan Hodge, is a 68 y.o. female, DOB - 1950-10-04, YPP:[id number]  Admit date - 07/31/2018   Admitting Physician Shela Leff, MD  Outpatient Primary MD for the patient is [name], NP  LOS - 2   Chief Complaint  Patient presents with   Back Pain   Hip Pain       Brief Narrative   68 y.o. 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  Patient states she had a fall over 2 weeks ago and fell on her buttocks.  Presents to [facility] due to fall and inability to walk, and her lumbar spine thickened for mild bulging disks and some foraminal encroachment, had outpatient MRI T-spine which did show multiple T2 signal changes in the lower cervical region to upper T11, he had clinical suspicion of myelitis, admitted for further work-up.   Subjective:    [name] today has, No headache, No chest pain, No abdominal pain - No Nausea,    Assessment  & Plan :    Principal Problem:   Myelitis ([place]) Active Problems:   Leukocytosis   Anxiety   GERD (gastroesophageal reflux disease)   Myelitis -MRI significant for  myelitis at C3-4, and C7, enhancing, with multiple areas of longitudinal signal changes suggestive of prior myelitis/demyelination, and patchy lesions noted in T4-5, and T9-11. -Neurology input greatly appreciated, discussed with neurology, her work-up significant for myelitis, due to autoimmune process, most  likely in the setting of lupus, differential diagnoses include transverse myelitis, MS, myelitis optica, VZV myelitis. -LP performed, follow on ACE, VZV PCR, HSV PCR, aquaprion for antibodies. -Follow on angiotensin-converting enzyme level, and copper serum per neurology -PT/OT consulted -Continue with IV Solu-Medrol 1 g IV daily total of 5 days -Continue B12 and calcium supplements  Acute left lower extremity DVT -Sent with left lower  extremity pain, swelling, venous Doppler significant for acute DVT, started on Lovenox, this is most likely provoked given her fall, left lower extremity trauma and poor mobility, he is currently on Lovenox treatment dose, will transition to Eliquis in 24 hours - scd on right lower extremity only  mild leukocytosis -Likely reactive.  White count 12.6.  Patient is afebrile.  Denies any respiratory complaints and lungs clear on exam.  Does not endorse any UTI symptoms.  Nontoxic-appearing.  Continue to monitor white count.  Anxiety -Continue home Xanax PRN.  GERD -PPI, will increase to twice daily given she is on steroids and Lovenox    Code Status : Full  Family Communication  : Cussed with daughter-in-law via phone yesterday and today at [phone]  Disposition Plan  : pending PT evaluation  Barriers For Discharge : remains on IV steroids  Consults  :  neurology  Procedures  : None  DVT Prophylaxis  :   lovenox  Lab Results  Component Value Date   PLT 203 08/02/2018    Antibiotics  :    Anti-infectives (From admission, onward)   None        Objective:   Vitals:   08/01/18 2019 08/02/18 0012 08/02/18 0323 08/02/18 0745  BP: (!) 114/57 (!) 95/55 119/70 104/63  Pulse: 73 62 69 66  Resp: 18 17 17 18   Temp: 98 F (36.7 C) (!) 97.5 F (36.4 C) 98 F (36.7 C) 97.8 F (36.6 C)  TempSrc: Oral Oral Oral Oral  SpO2: 100% 97% 98% 100%  Weight:      Height:        Wt Readings from Last 3 Encounters:  07/31/18 57.1 kg    07/26/18 56.2 kg     Intake/Output Summary (Last 24 hours) at 08/02/2018 0928 Last data filed at 08/02/2018 0000 Gross per 24 hour  Intake 305.21 ml  Output 500 ml  Net -194.79 ml     Physical Exam  Awake Alert, Oriented X 3, No new F.N deficits, Normal affect Symmetrical Chest wall movement, Good air movement bilaterally, CTAB RRR,No Gallops,Rubs or new Murmurs, No Parasternal Heave +ve B.Sounds, Abd Soft, No tenderness, No rebound - guarding or rigidity. No Cyanosis, Clubbing , +1 LLE edema, No new Rash or bruise       Data Review:    CBC Recent Labs  Lab 07/26/18 1632 07/31/18 1704 08/01/18 0242 08/02/18 0725  WBC 9.3 12.6* 10.3 9.6  HGB 13.6 12.4 11.9* 12.9  HCT 40.8 38.0 34.7* 38.1  PLT 200 200 189 203  MCV 93.4 94.3 91.8 92.5  MCH 31.1 30.8 31.5 31.3  MCHC 33.3 32.6 34.3 33.9  RDW 12.6 12.7 12.7 12.8  LYMPHSABS 0.8  --   --   --   MONOABS 0.5  --   --   --   EOSABS 0.1  --   --   --   BASOSABS 0.0  --   --   --     Chemistries  Recent Labs  Lab 07/26/18 1632 07/31/18 1704 08/02/18 0725  NA 139 136 138  K 4.0 4.3 4.2  CL 106 102 102  CO2 25 25 26   GLUCOSE 118* 121* 133*  BUN 24* 35* 27*  CREATININE 0.81 0.97 0.91  CALCIUM 9.5 9.5 9.3  AST  --  23  --   ALT  --  25  --   ALKPHOS  --  68  --   BILITOT  --  0.3  --    ------------------------------------------------------------------------------------------------------------------  No results for input(s): CHOL, HDL, LDLCALC, TRIG, CHOLHDL, LDLDIRECT in the last 72 hours.  No results found for: HGBA1C ------------------------------------------------------------------------------------------------------------------ No results for input(s): TSH, T4TOTAL, T3FREE, THYROIDAB in the last 72 hours.  Invalid input(s): FREET3 ------------------------------------------------------------------------------------------------------------------ Recent Labs    07/31/18 1723 08/01/18 1341  VITAMINB12  1,400* 1,457*    Coagulation profile No results for input(s): INR, PROTIME in the last 168 hours.  No results for input(s): DDIMER in the last 72 hours.  Cardiac Enzymes No results for input(s): CKMB, TROPONINI, MYOGLOBIN in the last 168 hours.  Invalid input(s): CK ------------------------------------------------------------------------------------------------------------------ No results found for: BNP  Inpatient Medications  Scheduled Meds:  enoxaparin (LOVENOX) injection  60 mg Subcutaneous Q12H   loratadine  10 mg Oral Daily   pantoprazole  40 mg Oral BID   pregabalin  50 mg Oral TID   traMADol  50 mg Oral Once   valproic acid  250 mg Oral BID   vitamin B-12  250 mcg Oral Daily   Vitamin D (Ergocalciferol)  50,000 Units Oral Q Mon   vitamin E  1,000 Units Oral Daily   Continuous Infusions:  methylPREDNISolone (SOLU-MEDROL) injection Stopped (08/01/18 2331)   PRN Meds:.acetaminophen **OR** acetaminophen, ALPRAZolam, hydrOXYzine, oxyCODONE-acetaminophen  Micro Results Recent Results (from the past 240 hour(s))  CSF culture with Stat gram stain     Status: None (Preliminary result)   Collection Time: 07/31/18 10:00 PM  Result Value Ref Range Status   Specimen Description CSF  Final   Special Requests TUBE 2  Final   Gram Stain   Final    CYTOSPIN SMEAR WBC PRESENT, PREDOMINANTLY MONONUCLEAR NO ORGANISMS SEEN    Culture   Final    NO GROWTH 2 DAYS Performed at [facility], [address], [place], [place] [address]    Report Status PENDING  Incomplete    Radiology Reports Mr Jeri Cos And Wo Contrast  Result Date: 07/31/2018 CLINICAL DATA:  Myelopathy. Left leg weakness and numbness. Urinary incontinence. EXAM: MRI HEAD WITHOUT AND WITH CONTRAST TECHNIQUE: Multiplanar, multiecho pulse sequences of the brain and surrounding structures were obtained without and with intravenous contrast. CONTRAST:  5 mL Gadavist COMPARISON:  None. FINDINGS: Brain:  No acute infarct, mass, midline shift, or extra-axial fluid collection is identified. There is a small to moderate-sized chronic right MCA infarct involving the frontal operculum and insula with a small amount of hemosiderin staining. Small foci of T2 hyperintensity scattered elsewhere in the cerebral white matter bilaterally, most notably in the left corona radiata, are at most mildly advanced for age. No abnormal brain parenchymal or meningeal enhancement is identified. There is mild global cerebral atrophy. Vascular: Major intracranial vascular flow voids are preserved. Skull and upper cervical spine: Unremarkable bone marrow signal. Sinuses/Orbits: Unremarkable orbits. Mild circumferential mucosal thickening and moderate volume fluid in the left maxillary sinus which appears small. Trace right mastoid effusion. Other: Diffuse nodularity/microcystic changes throughout both parotid glands. IMPRESSION: 1. No acute intracranial abnormality. 2. Chronic right MCA infarct. 3. Cerebral white matter T2 signal changes, nonspecific but compatible with mild chronic small vessel ischemic disease. 4. Diffuse nodularity/microcystic changes in the parotid gland suggestive of chronic inflammation or autoimmune disease. Electronically Signed   By: Logan Bores M.D.   On: 07/31/2018 20:13   Mr Thoracic Spine Wo Contrast  Result Date: 07/30/2018 CLINICAL DATA:  Urinary incontinence. Diminished sensation of the left leg. EXAM: MRI THORACIC SPINE WITHOUT CONTRAST TECHNIQUE: Multiplanar, multisequence MR imaging of the thoracic spine was  performed. No intravenous contrast was administered. COMPARISON:  None. FINDINGS: Alignment:  Normal Vertebrae: No fracture or primary bone lesion. Cord: Multifocal abnormal cord signal with increased T2 within the central cord. This is most notable at the lower cervical region, T4 and T5, and T9-T11, but is present to a lesser degree in the intervening cord. There is no cord compression.  Paraspinal and other soft tissues: Negative Disc levels: No significant degenerative disease. At T5-6 there is a shallow right posterolateral disc protrusion with its does not compress the neural structures. Minor disc bulges at T9-10 and T11-12. IMPRESSION: Abnormal T2 signal affecting the spinal cord in a patchy fashion from the lower cervical region down to the upper T11 level. Involvement appears to primarily affect the central cord, without lateralization. The differential diagnosis is not specific. This does not appear to be focal syringomyelia. Multifocal intramedullary tumor, ischemic change of the spinal cord, demyelinating disease and vitamin-B deficiency are all theoretically possible. Electronically Signed   By: Nelson Chimes M.D.   On: 07/30/2018 17:31   Mr Lumbar Spine Wo Contrast  Result Date: 07/26/2018 CLINICAL DATA:  [place] previously with low back pain and left leg pain. EXAM: MRI LUMBAR SPINE WITHOUT CONTRAST TECHNIQUE: Multiplanar, multisequence MR imaging of the lumbar spine was performed. No intravenous contrast was administered. COMPARISON:  None. FINDINGS: Segmentation:  5 lumbar type vertebral bodies. Alignment: Mild curvature convex to the left. 2 mm retrolisthesis L1-2. 2 mm anterolisthesis L3-4. Vertebrae:  No fracture or primary bone lesion. Conus medullaris and cauda equina: Conus extends to the L1 level. Conus and cauda equina appear normal. Paraspinal and other soft tissues: Negative Disc levels: T12-L1: Mild noncompressive disc bulge. L1-2: 2 mm retrolisthesis. Mild bulging of the disc. No compressive stenosis. L2-3: Mild bulging of the disc.  No compressive stenosis. L3-4: Bilateral facet arthropathy with some edema. 2 mm of anterolisthesis because of this. Bulging of the disc. Stenosis of the lateral recesses left more than right. Some potential for neural compression, particularly on the left. Mild left foraminal to extraforaminal encroachment by bulging disc material could  focally irritate the left L3 nerve. L4-5: Endplate osteophytes and bulging of the disc. Bilateral facet and ligamentous hypertrophy. Stenosis of both lateral recesses that could possibly cause neural compression on either or both sides. The facet arthritis could also contribute to back pain. L5-S1: Mild bulging of the disc. Mild facet and ligamentous hypertrophy. Mild narrowing of the left subarticular lateral recess but without visible compression of the transitioning S1 nerve. IMPRESSION: No evidence of fracture related to a recent fall. L3-4: Bilateral facet arthropathy allowing 2 mm of anterolisthesis. Some edema of the facet joints. Bulging of the disc more towards the left. Narrowing of the lateral recesses left more than right and mild left foraminal to extraforaminal encroachment by bulging disc material. Findings at this level could certainly be associated with back pain. There be some potential for neural compression or irritation particularly in the left lateral recess and left foraminal region. L4-5: Endplate osteophytes and bulging of the disc. Facet degeneration and hypertrophy. Stenosis of both subarticular lateral recesses. Neural compression could occur on either or both sides. The facet arthritis could contribute to back pain. L5-S1: Bulging of the disc. Mild facet hypertrophy on the left. Mild narrowing of the subarticular lateral recess but without visible compression of the transitioning left S1 nerve Electronically Signed   By: Nelson Chimes M.D.   On: 07/26/2018 18:22   Mr Cervical Spine W Or Wo Contrast  Result Date: 07/31/2018 CLINICAL DATA:  Myelopathy. Pain after a fall earlier in the month. Left leg weakness and numbness. Urinary incontinence. History of lupus. EXAM: MRI CERVICAL SPINE WITHOUT AND WITH CONTRAST TECHNIQUE: Multiplanar and multiecho pulse sequences of the cervical spine, to include the craniocervical junction and cervicothoracic junction, were obtained without and with  intravenous contrast. CONTRAST:  5 mL Gadavist COMPARISON:  Thoracic spine MRI 07/30/2018 FINDINGS: Intermittent moderate motion artifact on axial sequences. Alignment: Cervical spine straightening.  No listhesis. Vertebrae: No fracture, suspicious osseous lesion, or significant marrow edema. Cord: Multifocal T2 hyperintensity in the cervical spinal cord including a 12 mm enhancing lesion in the left hemicord at C3-4 and a nearly 2 cm minimally enhancing lesion in the right and central aspects of the cord at C7. Lower level nonenhancing T2 hyperintensity elsewhere in the cord including in the upper thoracic spine and on the right at C2. Posterior Fossa, vertebral arteries, paraspinal tissues: Microcystic changes in the parotid glands as reported on brain MRI. Preserved vertebral artery flow voids. Disc levels: C2-3: Negative. C3-4: Negative. C4-5: Mild disc space narrowing. Mild disc bulging and minimal uncovertebral spurring without significant stenosis. C5-6: Mild disc space narrowing. Mild disc bulging and uncovertebral spurring without significant stenosis. C6-7: Moderate disc space narrowing. Disc bulging, a small left posterolateral disc protrusion, and uncovertebral spurring result in borderline to mild right and mild-to-moderate left neural foraminal stenosis without significant spinal stenosis. C7-T1: Shallow right paracentral disc protrusion without stenosis. IMPRESSION: 1. Multifocal T2 signal abnormality in the cervical spinal cord including enhancing lesions at C3-4 and C7. The appearance is nonspecific, however lupus myelitis, other inflammatory or infectious myelitis, and demyelinating disease are considerations. Tumor and ischemia are felt to be unlikely. 2. Cervical disc degeneration greatest at C6-7 where there is mild-to-moderate left neural foraminal stenosis. No significant spinal stenosis. Electronically Signed   By: Logan Bores M.D.   On: 07/31/2018 20:52   US Arterial Abi (screening Lower  Extremity)  Result Date: 07/22/2018 CLINICAL DATA:  Cold left leg.  Left lower extremity pain. EXAM: NONINVASIVE PHYSIOLOGIC VASCULAR STUDY OF BILATERAL LOWER EXTREMITIES TECHNIQUE: Evaluation of both lower extremities were performed at rest, including calculation of ankle-brachial indices with single level Doppler, pressure and pulse volume recording. COMPARISON:  None. FINDINGS: Right ABI:  1.02 Left ABI:  0.96 Right Lower Extremity:  Normal arterial waveforms at the ankle. Left Lower Extremity:  Normal arterial waveforms at the ankle. 1.0-1.4 Normal IMPRESSION: Normal resting ankle-brachial indices. Electronically Signed   By: Markus Daft M.D.   On: 07/22/2018 16:49   Vas [place] Lower Extremity Venous (dvt)  Result Date: 08/01/2018  Lower Venous Study Indications: Pain.  Performing Technologist: June Leap RDMS, RVT  Examination Guidelines: A complete evaluation includes B-mode imaging, spectral Doppler, color Doppler, and power Doppler as needed of all accessible portions of each vessel. Bilateral testing is considered an integral part of a complete examination. Limited examinations for reoccurring indications may be performed as noted.  Right Venous Findings: +---+---------------+---------+-----------+----------+-------+      Compressibility Phasicity Spontaneity Properties Summary  +---+---------------+---------+-----------+----------+-------+  CFV Full            Yes       Yes                             +---+---------------+---------+-----------+----------+-------+  Left Venous Findings: +---------+---------------+---------+-----------+----------+-------+            Compressibility Phasicity Spontaneity Properties Summary  +---------+---------------+---------+-----------+----------+-------+  CFV       Full            Yes       Yes                             +---------+---------------+---------+-----------+----------+-------+  SFJ       Full                                                       +---------+---------------+---------+-----------+----------+-------+  FV Prox   Full                                                      +---------+---------------+---------+-----------+----------+-------+  FV Mid    Full                                                      +---------+---------------+---------+-----------+----------+-------+  FV Distal Full                                                      +---------+---------------+---------+-----------+----------+-------+  PFV       Full                                                      +---------+---------------+---------+-----------+----------+-------+  POP       Full            Yes       Yes                             +---------+---------------+---------+-----------+----------+-------+  PTV       Full                                                      +---------+---------------+---------+-----------+----------+-------+  PERO      Full                                                      +---------+---------------+---------+-----------+----------+-------+  Gastroc   None                                             Acute    +---------+---------------+---------+-----------+----------+-------+  Summary: Right: No evidence of common femoral vein obstruction. Left: Findings consistent with acute deep vein thrombosis involving the left gastrocnemius vein. No cystic structure found in the popliteal fossa.  *See table(s) above for measurements and observations.    Preliminary       Phillips Climes M.D on 08/02/2018 at 9:28 AM  Between 7am to 7pm - Pager - [phone]  After 7pm go to [email or web address] - password [facility]  [facility] -  Office  [phone]

## 2018-08-02 NOTE — Progress Notes (Signed)
Inpatient Rehab Admissions:  Inpatient Rehab Consult received.  I met with patient at the bedside for rehabilitation assessment and to discuss goals and expectations of an inpatient rehab admission.  She is interested in CIR.  I will follow along for insurance authorization and medical readiness for possible rehab admission.   Signed: Caitlin Warren, PT, DPT Admissions Coordinator [phone] 08/02/18  4:11 PM    

## 2018-08-02 NOTE — Progress Notes (Signed)
Rehab Admissions Coordinator Note:  Per PT recommendation, this patient was screened by [name] for appropriateness for an Inpatient Acute Rehab Consult.  At this time, we are recommending Inpatient Rehab consult. AC will contact MD to request an IP rehab Consult Order.   [name] 08/02/2018, 9:18 AM  I can be reached at [phone].

## 2018-08-03 ENCOUNTER — Inpatient Hospital Stay (HOSPITAL_COMMUNITY)
Admission: RE | Admit: 2018-08-03 | Discharge: 2018-08-18 | DRG: 945 | Disposition: A | Discharge status: Home Health | Payer: Medicare HMO | Source: Intra-hospital | Attending: Physical Medicine & Rehabilitation | Admitting: Physical Medicine & Rehabilitation

## 2018-08-03 ENCOUNTER — Encounter (HOSPITAL_COMMUNITY): Payer: Self-pay

## 2018-08-03 ENCOUNTER — Other Ambulatory Visit: Payer: Self-pay

## 2018-08-03 DIAGNOSIS — F329 Major depressive disorder, single episode, unspecified: Secondary | ICD-10-CM | POA: Diagnosis present

## 2018-08-03 DIAGNOSIS — Z6824 Body mass index (BMI) 24.0-24.9, adult: Secondary | ICD-10-CM

## 2018-08-03 DIAGNOSIS — Z8673 Personal history of transient ischemic attack (TIA), and cerebral infarction without residual deficits: Secondary | ICD-10-CM

## 2018-08-03 DIAGNOSIS — K592 Neurogenic bowel, not elsewhere classified: Secondary | ICD-10-CM | POA: Diagnosis not present

## 2018-08-03 DIAGNOSIS — Z79891 Long term (current) use of opiate analgesic: Secondary | ICD-10-CM | POA: Diagnosis not present

## 2018-08-03 DIAGNOSIS — R531 Weakness: Principal | ICD-10-CM | POA: Diagnosis present

## 2018-08-03 DIAGNOSIS — Z7952 Long term (current) use of systemic steroids: Secondary | ICD-10-CM | POA: Diagnosis not present

## 2018-08-03 DIAGNOSIS — E46 Unspecified protein-calorie malnutrition: Secondary | ICD-10-CM | POA: Diagnosis present

## 2018-08-03 DIAGNOSIS — N319 Neuromuscular dysfunction of bladder, unspecified: Secondary | ICD-10-CM | POA: Diagnosis not present

## 2018-08-03 DIAGNOSIS — I824Z2 Acute embolism and thrombosis of unspecified deep veins of left distal lower extremity: Secondary | ICD-10-CM | POA: Diagnosis not present

## 2018-08-03 DIAGNOSIS — E039 Hypothyroidism, unspecified: Secondary | ICD-10-CM | POA: Diagnosis present

## 2018-08-03 DIAGNOSIS — Z90722 Acquired absence of ovaries, bilateral: Secondary | ICD-10-CM

## 2018-08-03 DIAGNOSIS — R2689 Other abnormalities of gait and mobility: Secondary | ICD-10-CM | POA: Diagnosis not present

## 2018-08-03 DIAGNOSIS — K219 Gastro-esophageal reflux disease without esophagitis: Secondary | ICD-10-CM | POA: Diagnosis present

## 2018-08-03 DIAGNOSIS — Z716 Tobacco abuse counseling: Secondary | ICD-10-CM

## 2018-08-03 DIAGNOSIS — I82462 Acute embolism and thrombosis of left calf muscular vein: Secondary | ICD-10-CM | POA: Diagnosis present

## 2018-08-03 DIAGNOSIS — E8809 Other disorders of plasma-protein metabolism, not elsewhere classified: Secondary | ICD-10-CM | POA: Diagnosis present

## 2018-08-03 DIAGNOSIS — N179 Acute kidney failure, unspecified: Secondary | ICD-10-CM | POA: Diagnosis not present

## 2018-08-03 DIAGNOSIS — Z888 Allergy status to other drugs, medicaments and biological substances status: Secondary | ICD-10-CM | POA: Diagnosis not present

## 2018-08-03 DIAGNOSIS — W19XXXD Unspecified fall, subsequent encounter: Secondary | ICD-10-CM | POA: Diagnosis present

## 2018-08-03 DIAGNOSIS — M50023 Cervical disc disorder at C6-C7 level with myelopathy: Secondary | ICD-10-CM | POA: Diagnosis present

## 2018-08-03 DIAGNOSIS — F17200 Nicotine dependence, unspecified, uncomplicated: Secondary | ICD-10-CM | POA: Diagnosis present

## 2018-08-03 DIAGNOSIS — Z91030 Bee allergy status: Secondary | ICD-10-CM | POA: Diagnosis not present

## 2018-08-03 DIAGNOSIS — G43909 Migraine, unspecified, not intractable, without status migrainosus: Secondary | ICD-10-CM | POA: Diagnosis present

## 2018-08-03 DIAGNOSIS — D62 Acute posthemorrhagic anemia: Secondary | ICD-10-CM | POA: Diagnosis not present

## 2018-08-03 DIAGNOSIS — G894 Chronic pain syndrome: Secondary | ICD-10-CM | POA: Diagnosis not present

## 2018-08-03 DIAGNOSIS — E441 Mild protein-calorie malnutrition: Secondary | ICD-10-CM | POA: Diagnosis present

## 2018-08-03 DIAGNOSIS — G0489 Other myelitis: Secondary | ICD-10-CM | POA: Diagnosis present

## 2018-08-03 DIAGNOSIS — Z79899 Other long term (current) drug therapy: Secondary | ICD-10-CM | POA: Diagnosis not present

## 2018-08-03 DIAGNOSIS — G0491 Myelitis, unspecified: Secondary | ICD-10-CM | POA: Diagnosis present

## 2018-08-03 DIAGNOSIS — M797 Fibromyalgia: Secondary | ICD-10-CM | POA: Diagnosis present

## 2018-08-03 DIAGNOSIS — F411 Generalized anxiety disorder: Secondary | ICD-10-CM | POA: Diagnosis not present

## 2018-08-03 DIAGNOSIS — Z7901 Long term (current) use of anticoagulants: Secondary | ICD-10-CM

## 2018-08-03 DIAGNOSIS — G8918 Other acute postprocedural pain: Secondary | ICD-10-CM | POA: Diagnosis not present

## 2018-08-03 DIAGNOSIS — Z9049 Acquired absence of other specified parts of digestive tract: Secondary | ICD-10-CM | POA: Diagnosis not present

## 2018-08-03 DIAGNOSIS — Z823 Family history of stroke: Secondary | ICD-10-CM | POA: Diagnosis not present

## 2018-08-03 DIAGNOSIS — R799 Abnormal finding of blood chemistry, unspecified: Secondary | ICD-10-CM

## 2018-08-03 DIAGNOSIS — R69 Illness, unspecified: Secondary | ICD-10-CM | POA: Diagnosis not present

## 2018-08-03 DIAGNOSIS — I959 Hypotension, unspecified: Secondary | ICD-10-CM | POA: Diagnosis not present

## 2018-08-03 DIAGNOSIS — M21372 Foot drop, left foot: Secondary | ICD-10-CM | POA: Diagnosis not present

## 2018-08-03 DIAGNOSIS — Z20828 Contact with and (suspected) exposure to other viral communicable diseases: Secondary | ICD-10-CM | POA: Diagnosis present

## 2018-08-03 DIAGNOSIS — I82402 Acute embolism and thrombosis of unspecified deep veins of left lower extremity: Secondary | ICD-10-CM | POA: Diagnosis not present

## 2018-08-03 DIAGNOSIS — M3214 Glomerular disease in systemic lupus erythematosus: Secondary | ICD-10-CM | POA: Diagnosis present

## 2018-08-03 LAB — VARICELLA-ZOSTER BY PCR: Varicella-Zoster, PCR: NEGATIVE

## 2018-08-03 LAB — COPPER, SERUM: Copper: 121 ug/dL (ref 72–166)

## 2018-08-03 MED ORDER — OXYCODONE-ACETAMINOPHEN 7.5-325 MG PO TABS
1.0000 | ORAL_TABLET | Freq: Three times a day (TID) | ORAL | Status: DC | PRN
  Administered 2018-08-03 – 2018-08-17 (×22): 1 via ORAL
  Filled 2018-08-03 (×22): qty 1

## 2018-08-03 MED ORDER — VALPROIC ACID 250 MG PO CAPS
250.0000 mg | ORAL_CAPSULE | Freq: Two times a day (BID) | ORAL | Status: DC
  Administered 2018-08-03 – 2018-08-11 (×13): 250 mg via ORAL
  Filled 2018-08-03 (×18): qty 1

## 2018-08-03 MED ORDER — PANTOPRAZOLE SODIUM 40 MG PO TBEC: DELAYED_RELEASE_TABLET | ORAL | Status: DC

## 2018-08-03 MED ORDER — SODIUM CHLORIDE 0.9 % IV SOLN
1000.0000 mg | INTRAVENOUS | Status: AC
  Administered 2018-08-03 – 2018-08-04 (×2): 1000 mg via INTRAVENOUS
  Filled 2018-08-03 (×2): qty 8

## 2018-08-03 MED ORDER — ACETAMINOPHEN 650 MG RE SUPP: 650.0000 mg | Freq: Four times a day (QID) | RECTAL | Status: DC | PRN

## 2018-08-03 MED ORDER — HYDROXYZINE HCL 10 MG PO TABS
10.0000 mg | ORAL_TABLET | Freq: Three times a day (TID) | ORAL | Status: DC | PRN
  Filled 2018-08-03: qty 1

## 2018-08-03 MED ORDER — PREGABALIN 50 MG PO CAPS
50.0000 mg | ORAL_CAPSULE | Freq: Three times a day (TID) | ORAL | Status: DC
  Administered 2018-08-03 – 2018-08-18 (×44): 50 mg via ORAL
  Filled 2018-08-03 (×44): qty 1

## 2018-08-03 MED ORDER — APIXABAN 5 MG PO TABS: ORAL_TABLET | ORAL | Status: DC

## 2018-08-03 MED ORDER — VITAMIN E 45 MG (100 UNIT) PO CAPS
1000.0000 [IU] | ORAL_CAPSULE | Freq: Every day | ORAL | Status: DC
  Administered 2018-08-04 – 2018-08-18 (×15): 1000 [IU] via ORAL
  Filled 2018-08-03 (×16): qty 2

## 2018-08-03 MED ORDER — APIXABAN 5 MG PO TABS: 10.0000 mg | ORAL_TABLET | Freq: Two times a day (BID) | ORAL | Status: DC

## 2018-08-03 MED ORDER — APIXABAN 5 MG PO TABS
10.0000 mg | ORAL_TABLET | Freq: Two times a day (BID) | ORAL | Status: AC
  Administered 2018-08-03 – 2018-08-10 (×14): 10 mg via ORAL
  Filled 2018-08-03 (×14): qty 2

## 2018-08-03 MED ORDER — APIXABAN 5 MG PO TABS
5.0000 mg | ORAL_TABLET | Freq: Two times a day (BID) | ORAL | Status: DC
  Administered 2018-08-10 – 2018-08-18 (×16): 5 mg via ORAL
  Filled 2018-08-03 (×16): qty 1

## 2018-08-03 MED ORDER — ALPRAZOLAM 0.5 MG PO TABS
0.5000 mg | ORAL_TABLET | Freq: Three times a day (TID) | ORAL | Status: DC | PRN
  Administered 2018-08-03 – 2018-08-17 (×16): 0.5 mg via ORAL
  Filled 2018-08-03 (×17): qty 1

## 2018-08-03 MED ORDER — ACETAMINOPHEN 325 MG PO TABS
650.0000 mg | ORAL_TABLET | Freq: Four times a day (QID) | ORAL | Status: DC | PRN
  Administered 2018-08-07 – 2018-08-15 (×5): 650 mg via ORAL
  Filled 2018-08-03 (×5): qty 2

## 2018-08-03 MED ORDER — VITAMIN D (ERGOCALCIFEROL) 1.25 MG (50000 UNIT) PO CAPS
50000.0000 [IU] | ORAL_CAPSULE | ORAL | Status: DC
  Administered 2018-08-09 – 2018-08-16 (×2): 50000 [IU] via ORAL
  Filled 2018-08-03 (×2): qty 1

## 2018-08-03 MED ORDER — PANTOPRAZOLE SODIUM 40 MG PO TBEC
40.0000 mg | DELAYED_RELEASE_TABLET | Freq: Two times a day (BID) | ORAL | Status: DC
  Administered 2018-08-03 – 2018-08-18 (×30): 40 mg via ORAL
  Filled 2018-08-03 (×30): qty 1

## 2018-08-03 MED ORDER — SORBITOL 70 % SOLN
30.0000 mL | Freq: Every day | Status: DC | PRN
  Administered 2018-08-04 – 2018-08-17 (×4): 30 mL via ORAL
  Filled 2018-08-03 (×4): qty 30

## 2018-08-03 MED ORDER — CYANOCOBALAMIN 500 MCG PO TABS
250.0000 ug | ORAL_TABLET | Freq: Every day | ORAL | Status: DC
  Administered 2018-08-04 – 2018-08-18 (×15): 250 ug via ORAL
  Filled 2018-08-03 (×16): qty 1

## 2018-08-03 MED ORDER — SODIUM CHLORIDE 0.9 % IV SOLN: 1000.0000 mg | INTRAVENOUS | Status: DC

## 2018-08-03 MED ORDER — LORATADINE 10 MG PO TABS
10.0000 mg | ORAL_TABLET | Freq: Every day | ORAL | Status: DC
  Administered 2018-08-04 – 2018-08-18 (×15): 10 mg via ORAL
  Filled 2018-08-03 (×15): qty 1

## 2018-08-03 NOTE — Progress Notes (Signed)
Inpatient Rehab Admissions Coordinator:   I received authorization from insurance for admission.  I've spoken with MD and he is in agreement.  Will plan for rehab admission today.  I will let patient, RN, RNCM, and CSW know.    Shann Medal, PT, DPT Admissions Coordinator [phone] 08/03/18  2:13 PM

## 2018-08-03 NOTE — Progress Notes (Signed)
Chaplain responded to call from referring chaplain.  Pt's sister had contacted [facility], requesting chaplain visit her sister.  Patient was sitting in chair and very talkative.  Described her lupus, from years ago to present, as well as shingles, and other ailments. Close large family that live almost next to each other.  She is one of six.  Her sister Penni Bombard requested we call on her.  Patient is pleased with the care she is receiving as she awaits approval from [employer] for rehab. Provided ministry of presence and prayer. Tamsen Snider Pager [phone]

## 2018-08-03 NOTE — Progress Notes (Signed)
Michel Santee, PT  Rehab Admission Coordinator  Physical Medicine and Rehabilitation  PMR Pre-admission  Signed  Date of Service:  08/03/2018 12:23 PM       Related encounter: ED to Hosp-Admission (Current) from 07/31/2018 in [facility]      Signed         Show:Clear all [x] Manual[x] Template[x] Copied  Added by: [x] Michel Santee, PT  [] Hover for details PMR Admission Coordinator Pre-Admission Assessment  Patient: Susan Hodge is an 68 y.o., female MRN: [id number] DOB: Aug 26, 1950 Height: 5' (152.4 cm) Weight: 57.1 kg                                                                                                                                                  Insurance Information HMO:     PPO: yes     PCP:      IPA:      80/20:      OTHER:  PRIMARY: [employer] Medicare      Policy#: [id number]      Subscriber: patient CM Name: Maudie Mercury      Phone#: [phone]     Fax#: [phone] Pre-Cert#: [id number] from 08/03/2018 through 08/05/2018 with updates due on 08/06/2018 to CM via fax [phone].  Case Manager to be assigned at a later date per Paula Compton (CM Supervisor)      Employer:  Benefits:  Phone #: [phone]     Name:  Eff. Date: 05/05/2018     Deduct: $0      Out of Pocket Max: $4200 (met $114)      Life Max: n/a CIR: $250/day for 6 days ($1,500 total)      SNF: $0 for first 20 days, $178/day for remaining 21-100 days Outpatient:      Co-Pay: $35 Home Health: 100%      Co-Pay:  DME: 80%     Co-Pay: 20% Providers:   SECONDARY:       Policy#:       Subscriber:  CM Name:       Phone#:      Fax#:  Pre-Cert#:       Employer:  Benefits:  Phone #:      Name:  Eff. Date:      Deduct:       Out of Pocket Max:       Life Max:  CIR:       SNF:  Outpatient:      Co-Pay:  Home Health:       Co-Pay:  DME:      Co-Pay:   Medicaid Application Date:       Case Manager:  Disability Application Date:       Case Worker:   The "Data Collection  Information Summary" for patients in Inpatient Rehabilitation Facilities with attached "[facility]  Records" was provided and verbally reviewed with: Patient  Emergency Contact Information         Contact Information    Name Relation Home Work Mobile   [place] Son [phone]  [phone]   Dahlia Byes [phone]  [phone]   byrd,bobby Pandora Leiter   [phone]     Current Medical History  Patient Admitting Diagnosis: C3-4, C7 myelopathy/melitis with left sided weakness and hemisensory deficits  History of Present Illness: Susan Hodge is a 68 year old right-handed female with history of migraine headaches maintained on valproic acid,, fibromyalgia, lupus and tobacco abuse.  Presented 07/31/2018 with recent fall 2 weeks ago landing on her buttocks, noted progressive lower extremity weakness. Denied any bowel or bladder disturbances. MRI the brain negative for acute changes however there was a chronic right MCA infarction. CT lumbar thoracic and cervical spine showed multifocal T2 signal abnormality and cervical spinal cord including enhancing lesion at C3-4 and 5 as well as C7. Cervical disc degeneration C6-7. Lumbar puncture completed WBC 14 lymphs 83,, glucose 64. Venous Doppler studies lower extremity showed acute DVT left gastrocnemius vein. Patient placed on therapeutic dose of Lovenox with plan to transition to Eliquis. Neurology consulted workup for myelitis at C3-4 and C7. Placed on IV Solu-Medrol through 08/04/2018.    Glasgow Coma Scale Score: 15  Past Medical History      Past Medical History:  Diagnosis Date  . Chest pain   . Depression   . DJD (degenerative joint disease)   . FH: CVA (cerebrovascular accident)    father  . Fibromyalgia   . GERD (gastroesophageal reflux disease)   . Hypothyroidism   . Lupus ([place])   . Migraines   . Palpitations   . Status post cholecystectomy    appendectomy and bilaateral  oophorectomy  . Tobacco use     Family History  family history includes Stroke in her father.  Prior Rehab/Hospitalizations:  Has the patient had prior rehab or hospitalizations prior to admission? No  Has the patient had major surgery during 100 days prior to admission? No  Current Medications   Current Facility-Administered Medications:  .  acetaminophen (TYLENOL) tablet 650 mg, 650 mg, Oral, Q6H PRN, 650 mg at 08/03/18 1014 **OR** acetaminophen (TYLENOL) suppository 650 mg, 650 mg, Rectal, Q6H PRN, Shela Leff, MD .  ALPRAZolam Duanne Moron) tablet 0.5 mg, 0.5 mg, Oral, TID PRN, Elgergawy, Silver Huguenin, MD, 0.5 mg at 08/02/18 2144 .  apixaban (ELIQUIS) tablet 10 mg, 10 mg, Oral, BID, Elgergawy, Silver Huguenin, MD .  hydrOXYzine (ATARAX/VISTARIL) tablet 10 mg, 10 mg, Oral, Q8H PRN, Elgergawy, Silver Huguenin, MD .  loratadine (CLARITIN) tablet 10 mg, 10 mg, Oral, Daily, Elgergawy, Silver Huguenin, MD, 10 mg at 08/03/18 1014 .  methylPREDNISolone sodium succinate (SOLU-MEDROL) 1,000 mg in sodium chloride 0.9 % 50 mL IVPB, 1,000 mg, Intravenous, Q24H, Shela Leff, MD, Last Rate: 58 mL/hr at 08/02/18 2154, 1,000 mg at 08/02/18 2154 .  oxyCODONE-acetaminophen (PERCOCET) 7.5-325 MG per tablet 1 tablet, 1 tablet, Oral, Q8H PRN, Elgergawy, Silver Huguenin, MD, 1 tablet at 08/02/18 2145 .  pantoprazole (PROTONIX) EC tablet 40 mg, 40 mg, Oral, BID, Elgergawy, Silver Huguenin, MD, 40 mg at 08/03/18 1014 .  pregabalin (LYRICA) capsule 50 mg, 50 mg, Oral, TID, Elgergawy, Silver Huguenin, MD, 50 mg at 08/03/18 1014 .  traMADol (ULTRAM) tablet 50 mg, 50 mg, Oral, Once, Blount, Xenia T, NP .  valproic acid (DEPAKENE) 250 MG capsule 250 mg, 250 mg, Oral, BID, Elgergawy, Silver Huguenin, MD,  250 mg at 08/03/18 1015 .  vitamin B-12 (CYANOCOBALAMIN) tablet 250 mcg, 250 mcg, Oral, Daily, Elgergawy, Silver Huguenin, MD, 250 mcg at 08/03/18 1015 .  Vitamin D (Ergocalciferol) (DRISDOL) capsule 50,000 Units, 50,000 Units, Oral, Q Mon, Elgergawy, Silver Huguenin, MD, 50,000 Units at 08/02/18 [phone] .  vitamin E capsule 1,000 Units, 1,000 Units, Oral, Daily, Elgergawy, Silver Huguenin, MD, 1,000 Units at 08/03/18 1012  Patients Current Diet:  Diet Order                  Diet Heart Room service appropriate? Yes; Fluid consistency: Thin  Diet effective now               Precautions / Restrictions Precautions Precautions: Fall Precaution Comments: L LE numbness, weakness. Restrictions Weight Bearing Restrictions: No   Has the patient had 2 or more falls or a fall with injury in the past year?Yes, the one that led to this admission.  Prior Activity Level Community (5-7x/wk): Was out daily, driving, until she fell 2 weeks ago.  Retired  Prior Functional Level Prior Function Level of Independence: Independent Comments: was indep until 2 weeks when she fell 2 weeks ago, now family is with her 24/7 providing assist, was driving and going to grocery store  [place]: Did the patient need help bathing, dressing, using the toilet or eating?  Independent  Indoor Mobility: Did the patient need assistance with walking from room to room (with or without device)? Independent  Stairs: Did the patient need assistance with internal or external stairs (with or without device)? Independent  Functional Cognition: Did the patient need help planning regular tasks such as shopping or remembering to take medications? [place] / Equipment Home Assistive Devices/Equipment: Cane (specify quad or straight), Eyeglasses, Shower chair with back, Environmental consultant (specify type), Wheelchair Home Equipment: Tub bench, None, Wheelchair - manual, Bedside commode  Prior Device Use: Indicate devices/aids used by the patient prior to current illness, exacerbation or injury? typically, pt does not use any DME; however, for 2 weeks immediately prior to admission pt was using a RW 2/2 lower extremity weakness.   Current Functional Level  Cognition  Overall Cognitive Status: Within Functional Limits for tasks assessed Orientation Level: Oriented X4 General Comments: pt is chatty, needs redirection to task at hand but overall WFL    Extremity Assessment (includes Sensation/Coordination)  Upper Extremity Assessment: Overall WFL for tasks assessed  Lower Extremity Assessment: Defer to PT evaluation LLE Deficits / Details: grossly 1/5 with exceptions of quad at 3-/5, pt with report of diminished sensation compared to the R LLE Sensation: decreased light touch    ADLs  Overall ADL's : Needs assistance/impaired Eating/Feeding: Set up, Sitting Grooming: Set up, Sitting Upper Body Bathing: Min guard, Sitting Lower Body Bathing: Minimal assistance, Sit to/from stand Upper Body Dressing : Set up, Min guard, Sitting Upper Body Dressing Details (indicate cue type and reason): donning new gown Lower Body Dressing: Minimal assistance, Sit to/from stand Lower Body Dressing Details (indicate cue type and reason): pt donning mesh underwear, she is able to physically lift LLE with her UEs to place into underwear, minA for standing balance; pt requires increased time to steady in standing and then is able to advance underwear over her hips using RUE, and with light minA Toilet Transfer: Minimal assistance, Ambulation Toilet Transfer Details (indicate cue type and reason): pt is able to peform mobility from chair in front of sink to recliner, hiking hips and coming up  onto toes when stepping to assist with advancing LEs - increased difficulty backing up towards recliner vs going forward Toileting- Clothing Manipulation and Hygiene: Minimal assistance, Moderate assistance, Sit to/from stand Functional mobility during ADLs: Minimal assistance, Rolling walker General ADL Comments: pt with increased LE weakness, decreased standing balance; pt just completed bathing with NT assist upon arrival - assisted with completion of ADL tasks and  returned to recliner end of session    Mobility  Overal bed mobility: Needs Assistance Bed Mobility: Supine to Sit Supine to sit: Min guard General bed mobility comments: increased time, used hands to assist L LE off EOB, HOB elevated    Transfers  Overall transfer level: Needs assistance Equipment used: Rolling walker (2 wheeled) Transfers: Sit to/from Stand Sit to Stand: Min guard Stand pivot transfers: Min assist General transfer comment: verbal cues to push up from bed, more steady today, have to use UEs to place L foot in proper/optimal placement to stand due to impaired sensation and weakness    Ambulation / Gait / Stairs / Wheelchair Mobility  Ambulation/Gait Ambulation/Gait assistance: Mod assist Gait Distance (Feet): 15 Feet(x2 (to/from bathroom)) Assistive device: Rolling walker (2 wheeled) Gait Pattern/deviations: Step-to pattern, Decreased stride length, Decreased stance time - left, Decreased step length - left, Narrow base of support, Decreased dorsiflexion - left General Gait Details: pt with no active DF, utilizes hip hike to advance LEs, with onset of fatigue pt requires minA to clear L foot and advance L LE, pt also raises up on R toes to advance L LE. Pt dependent on bilat UE to support during stance phase on the L LE Gait velocity: slow, fatiguing Gait velocity interpretation: <1.8 ft/sec, indicate of risk for recurrent falls    Posture / Balance Balance Overall balance assessment: Needs assistance Sitting-balance support: Feet supported, No upper extremity supported Sitting balance-Leahy Scale: Good Standing balance support: Bilateral upper extremity supported Standing balance-Leahy Scale: Poor Standing balance comment: dependent on UEs and RW    Special needs/care consideration BiPAP/CPAP no CPM no Continuous Drip IV: IV solumedrol 1000 mg/day to be stopped on 08/04/2018 Dialysis no        Days n/a Life Vest no Oxygen no Special Bed no Trach Size  no Wound Vac (area) no      Location n/a Skin intact                              Bowel mgmt: continent, ambulates to BR with assist, last BM 08/02/2018 Bladder mgmt: continent, ambulates to BR with assist Diabetic mgmt no Behavioral consideration no Chemo/radiation no     Previous Home Environment (from acute therapy documentation) Living Arrangements: Alone Available Help at Discharge: Family, Available 24 hours/day Type of Home: House Home Layout: One level Home Access: Stairs to enter Entrance Stairs-Rails: Left Entrance Stairs-Number of Steps: 2 Bathroom Shower/Tub: Chiropodist: Standard Home Care Services: Other (Comment)(family members) Additional Comments: family providing 24/7  Discharge Living Setting Plans for Discharge Living Setting: Patient's home Type of Home at Discharge: House(trailer) Discharge Home Layout: One level Discharge Home Access: Stairs to enter Entrance Stairs-Rails: Left Entrance Stairs-Number of Steps: 2 Discharge Bathroom Shower/Tub: Tub/shower unit Discharge Bathroom Toilet: Standard Discharge Bathroom Accessibility: Yes How Accessible: Accessible via walker Does the patient have any problems obtaining your medications?: No  Social/Family/Support Systems Anticipated Caregiver: [name] and [name] (pt's son and dtr in law) Anticipated Caregiver's Contact Information: [name] [phone], and [name]  [phone] Ability/Limitations of Caregiver: none, have been providing assist for 2 weeks immediately prior to admission Caregiver Availability: 24/7 Discharge Plan Discussed with Primary Caregiver: No(Pt cognitively intact w/ mod I goals.  See Add. Info below. ) Does Caregiver/Family have Issues with Lodging/Transportation while Pt is in Rehab?: No   Goals/Additional Needs Patient/Family Goal for Rehab: PT/OT mod I Expected length of stay: 8-13 days Cultural Considerations: none Dietary Needs: heart healthy Equipment  Needs: tbd Special Service Needs: none Additional Information: Pt's son and daughter in law have been providing 24/7 assist for patient for 2 weeks immediately prior to admission.  Per patient they are prepared to provide assist at discharge if needed; however, pt with mod I goals.  Pt/Family Agrees to Admission and willing to participate: Yes Program Orientation Provided & Reviewed with Pt/Caregiver Including Roles  & Responsibilities: Yes    Possible need for SNF placement upon discharge: not anticipated   Patient Condition: This patient's condition remains as documented in the consult dated 08/02/2018, in which the Rehabilitation Physician determined and documented that the patient's condition is appropriate for intensive rehabilitative care in an inpatient rehabilitation facility. Will admit to inpatient rehab today.  Preadmission Screen Completed By:  Michel Santee, PT, 08/03/2018 2:38 PM ______________________________________________________________________   Discussed status with Dr. Naaman Plummer on 08/03/18 at 2:38 PM  and received approval for admission today.  Admission Coordinator:  Michel Santee, time 2:38 PM Sudie Grumbling 08/03/18          Cosigned by: Meredith Staggers, MD at 08/03/2018 3:12 PM  Revision History

## 2018-08-03 NOTE — Progress Notes (Signed)
PROGRESS NOTE                                                                                                                                                                                                             Patient Demographics:    Susan Hodge, is a 68 y.o. female, DOB - July 01, 1950, CVE:[id number]  Admit date - 07/31/2018   Admitting Physician Susan Leff, MD  Outpatient Primary MD for the patient is Susan Katz, NP  LOS - 3   Chief Complaint  Patient presents with   Back Pain   Hip Pain       Brief Narrative   68 y.o. 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  Patient states she had a fall over 2 weeks ago and fell on her buttocks.  Presents to [facility] 3/23 due to fall and inability to walk, and her lumbar spine thickened for mild bulging disks and some foraminal encroachment, had outpatient MRI T-spine which did show multiple T2 signal changes in the lower cervical region to upper T11, he had clinical suspicion of myelitis, admitted for further work-up.   Subjective:    Susan Hodge today has, No headache, No chest pain, No abdominal pain - No Nausea, but she is feeling her left lower extremity weakness is improving, she reports some tingling and numbness in left lower extremity today.   Assessment  & Plan :    Principal Problem:   Myelitis ([place]) Active Problems:   Leukocytosis   Anxiety   GERD (gastroesophageal reflux disease)   Myelitis -MRI significant for  myelitis at C3-4, and C7, enhancing, with multiple areas of longitudinal signal changes suggestive of prior myelitis/demyelination, and patchy lesions noted in T4-5, and  T9-11. -Neurology input greatly appreciated, discussed with neurology, her work-up significant for myelitis, due to autoimmune process, most likely in the setting of lupus, differential diagnoses include transverse myelitis, MS, myelitis optica, VZV myelitis. -LP performed, follow onVZV PCR, aquaprion for antibodies, copper serum. -  Her HSV is negative, ACE level within normal limit, CSF stain and cultures are negative -Follow on angiotensin-converting  enzyme level, and copper serum per neurology -PT/OT consulted ,  recommendation for CIR -[place] total of 5 days of IV Solu-Medrol 1 g, tomorrow will be the last dose, weakness appears to be improving -Continue B12 and calcium supplements  Acute left lower extremity DVT -presents  with left lower extremity pain, swelling, venous Doppler significant for acute DVT, started on Lovenox, this is most likely provoked given her fall, left lower extremity trauma and poor mobility, he is currently on Lovenox treatment dose, position to Eliquis today - scd on right lower extremity only  mild leukocytosis -Likely reactive.  White count 12.6.  Patient is afebrile.  Denies any respiratory complaints and lungs clear on exam.  Does not endorse any UTI symptoms.  Nontoxic-appearing.  -Resolved  Anxiety -Continue home Xanax PRN.  GERD -PPI, will increase to twice daily given she is on steroids and Lovenox    Code Status : Full  Family Communication  : Discussed with daughter-in-law via phone  at [phone]  Disposition Plan  : CIR, after she finishes her steroids tomorrow  Barriers For Discharge : remains on IV steroids  Consults  :  neurology  Procedures  : None  DVT Prophylaxis  :  [place] lovenox  Lab Results  Component Value Date   PLT 203 08/02/2018    Antibiotics  :    Anti-infectives (From admission, onward)   None        Objective:   Vitals:   08/02/18 2009 08/03/18 0000 08/03/18 0428 08/03/18 0724  BP: (!) 97/51 (!) 93/47  (!) 97/55 (!) 92/46  Pulse: 63 (!) 59 63 65  Resp: 19 18 19 18   Temp: (!) 97.4 F (36.3 C) 98 F (36.7 C) 98 F (36.7 C) 98.2 F (36.8 C)  TempSrc: Oral Oral Oral Oral  SpO2: 100% 99% 96% 99%  Weight:      Height:        Wt Readings from Last 3 Encounters:  07/31/18 57.1 kg  07/26/18 56.2 kg     Intake/Output Summary (Last 24 hours) at 08/03/2018 1055 Last data filed at 08/02/2018 1500 Gross per 24 hour  Intake 320 ml  Output --  Net 320 ml     Physical Exam  Awake Alert, Oriented X 3, No new F.N deficits, Normal affect Symmetrical Chest wall movement, Good air movement bilaterally, CTAB RRR,No Gallops,Rubs or new Murmurs, No Parasternal Heave +ve B.Sounds, Abd Soft, No tenderness, No rebound - guarding or rigidity. No Cyanosis, Clubbing , left lower extremity edema has improved, No new Rash or bruise -has significant  left lower extremity weakness      Data Review:    CBC Recent Labs  Lab 07/31/18 1704 08/01/18 0242 08/02/18 0725  WBC 12.6* 10.3 9.6  HGB 12.4 11.9* 12.9  HCT 38.0 34.7* 38.1  PLT 200 189 203  MCV 94.3 91.8 92.5  MCH 30.8 31.5 31.3  MCHC 32.6 34.3 33.9  RDW 12.7 12.7 12.8    Chemistries  Recent Labs  Lab 07/31/18 1704 08/02/18 0725  NA 136 138  K 4.3 4.2  CL 102 102  CO2 25 26  GLUCOSE 121* 133*  BUN 35* 27*  CREATININE 0.97 0.91  CALCIUM 9.5 9.3  AST 23  --   ALT 25  --   ALKPHOS 68  --   BILITOT 0.3  --    ------------------------------------------------------------------------------------------------------------------ No results for input(s): CHOL, HDL, LDLCALC, TRIG, CHOLHDL, LDLDIRECT in the last 72 hours.  No results found for: HGBA1C ------------------------------------------------------------------------------------------------------------------  No results for input(s): TSH, T4TOTAL, T3FREE, THYROIDAB in the last 72 hours.  Invalid input(s):  FREET3 ------------------------------------------------------------------------------------------------------------------ Recent Labs    07/31/18 1723 08/01/18 1341  VITAMINB12 1,400* 1,457*    Coagulation profile No results for input(s): INR, PROTIME in the last 168 hours.  No results for input(s): DDIMER in the last 72 hours.  Cardiac Enzymes No results for input(s): CKMB, TROPONINI, MYOGLOBIN in the last 168 hours.  Invalid input(s): CK ------------------------------------------------------------------------------------------------------------------ No results found for: BNP  Inpatient Medications  Scheduled Meds:  enoxaparin (LOVENOX) injection  60 mg Subcutaneous Q12H   loratadine  10 mg Oral Daily   pantoprazole  40 mg Oral BID   pregabalin  50 mg Oral TID   traMADol  50 mg Oral Once   valproic acid  250 mg Oral BID   vitamin B-12  250 mcg Oral Daily   Vitamin D (Ergocalciferol)  50,000 Units Oral Q Mon   vitamin E  1,000 Units Oral Daily   Continuous Infusions:  methylPREDNISolone (SOLU-MEDROL) injection 1,000 mg (08/02/18 2154)   PRN Meds:.acetaminophen **OR** acetaminophen, ALPRAZolam, hydrOXYzine, oxyCODONE-acetaminophen  Micro Results Recent Results (from the past 240 hour(s))  CSF culture with Stat gram stain     Status: None (Preliminary result)   Collection Time: 07/31/18 10:00 PM  Result Value Ref Range Status   Specimen Description CSF  Final   Special Requests TUBE 2  Final   Gram Stain   Final    CYTOSPIN SMEAR WBC PRESENT, PREDOMINANTLY MONONUCLEAR NO ORGANISMS SEEN    Culture   Final    NO GROWTH 3 DAYS Performed at [facility], [address], [place], [place] [address]    Report Status PENDING  Incomplete    Radiology Reports Mr Jeri Cos And Wo Contrast  Result Date: 07/31/2018 CLINICAL DATA:  Myelopathy. Left leg weakness and numbness. Urinary incontinence. EXAM: MRI HEAD WITHOUT AND WITH CONTRAST TECHNIQUE:  Multiplanar, multiecho pulse sequences of the brain and surrounding structures were obtained without and with intravenous contrast. CONTRAST:  5 mL Gadavist COMPARISON:  None. FINDINGS: Brain: No acute infarct, mass, midline shift, or extra-axial fluid collection is identified. There is a small to moderate-sized chronic right MCA infarct involving the frontal operculum and insula with a small amount of hemosiderin staining. Small foci of T2 hyperintensity scattered elsewhere in the cerebral white matter bilaterally, most notably in the left corona radiata, are at most mildly advanced for age. No abnormal brain parenchymal or meningeal enhancement is identified. There is mild global cerebral atrophy. Vascular: Major intracranial vascular flow voids are preserved. Skull and upper cervical spine: Unremarkable bone marrow signal. Sinuses/Orbits: Unremarkable orbits. Mild circumferential mucosal thickening and moderate volume fluid in the left maxillary sinus which appears small. Trace right mastoid effusion. Other: Diffuse nodularity/microcystic changes throughout both parotid glands. IMPRESSION: 1. No acute intracranial abnormality. 2. Chronic right MCA infarct. 3. Cerebral white matter T2 signal changes, nonspecific but compatible with mild chronic small vessel ischemic disease. 4. Diffuse nodularity/microcystic changes in the parotid gland suggestive of chronic inflammation or autoimmune disease. Electronically Signed   By: Logan Bores M.D.   On: 07/31/2018 20:13   Mr Thoracic Spine Wo Contrast  Result Date: 07/30/2018 CLINICAL DATA:  Urinary incontinence. Diminished sensation of the left leg. EXAM: MRI THORACIC SPINE WITHOUT CONTRAST TECHNIQUE: Multiplanar, multisequence MR imaging of the thoracic spine was performed. No intravenous contrast was administered. COMPARISON:  None. FINDINGS: Alignment:  Normal Vertebrae: No fracture or primary bone lesion. Cord:  Multifocal abnormal cord signal with increased T2  within the central cord. This is most notable at the lower cervical region, T4 and T5, and T9-T11, but is present to a lesser degree in the intervening cord. There is no cord compression. Paraspinal and other soft tissues: Negative Disc levels: No significant degenerative disease. At T5-6 there is a shallow right posterolateral disc protrusion with its does not compress the neural structures. Minor disc bulges at T9-10 and T11-12. IMPRESSION: Abnormal T2 signal affecting the spinal cord in a patchy fashion from the lower cervical region down to the upper T11 level. Involvement appears to primarily affect the central cord, without lateralization. The differential diagnosis is not specific. This does not appear to be focal syringomyelia. Multifocal intramedullary tumor, ischemic change of the spinal cord, demyelinating disease and vitamin-B deficiency are all theoretically possible. Electronically Signed   By: Nelson Chimes M.D.   On: 07/30/2018 17:31   Mr Lumbar Spine Wo Contrast  Result Date: 07/26/2018 CLINICAL DATA:  [place] previously with low back pain and left leg pain. EXAM: MRI LUMBAR SPINE WITHOUT CONTRAST TECHNIQUE: Multiplanar, multisequence MR imaging of the lumbar spine was performed. No intravenous contrast was administered. COMPARISON:  None. FINDINGS: Segmentation:  5 lumbar type vertebral bodies. Alignment: Mild curvature convex to the left. 2 mm retrolisthesis L1-2. 2 mm anterolisthesis L3-4. Vertebrae:  No fracture or primary bone lesion. Conus medullaris and cauda equina: Conus extends to the L1 level. Conus and cauda equina appear normal. Paraspinal and other soft tissues: Negative Disc levels: T12-L1: Mild noncompressive disc bulge. L1-2: 2 mm retrolisthesis. Mild bulging of the disc. No compressive stenosis. L2-3: Mild bulging of the disc.  No compressive stenosis. L3-4: Bilateral facet arthropathy with some edema. 2 mm of anterolisthesis because of this. Bulging of the disc. Stenosis of the  lateral recesses left more than right. Some potential for neural compression, particularly on the left. Mild left foraminal to extraforaminal encroachment by bulging disc material could focally irritate the left L3 nerve. L4-5: Endplate osteophytes and bulging of the disc. Bilateral facet and ligamentous hypertrophy. Stenosis of both lateral recesses that could possibly cause neural compression on either or both sides. The facet arthritis could also contribute to back pain. L5-S1: Mild bulging of the disc. Mild facet and ligamentous hypertrophy. Mild narrowing of the left subarticular lateral recess but without visible compression of the transitioning S1 nerve. IMPRESSION: No evidence of fracture related to a recent fall. L3-4: Bilateral facet arthropathy allowing 2 mm of anterolisthesis. Some edema of the facet joints. Bulging of the disc more towards the left. Narrowing of the lateral recesses left more than right and mild left foraminal to extraforaminal encroachment by bulging disc material. Findings at this level could certainly be associated with back pain. There be some potential for neural compression or irritation particularly in the left lateral recess and left foraminal region. L4-5: Endplate osteophytes and bulging of the disc. Facet degeneration and hypertrophy. Stenosis of both subarticular lateral recesses. Neural compression could occur on either or both sides. The facet arthritis could contribute to back pain. L5-S1: Bulging of the disc. Mild facet hypertrophy on the left. Mild narrowing of the subarticular lateral recess but without visible compression of the transitioning left S1 nerve Electronically Signed   By: Nelson Chimes M.D.   On: 07/26/2018 18:22   Mr Cervical Spine W Or Wo Contrast  Result Date: 07/31/2018 CLINICAL DATA:  Myelopathy. Pain after a fall earlier in the month. Left leg weakness and numbness.  Urinary incontinence. History of lupus. EXAM: MRI CERVICAL SPINE WITHOUT AND WITH  CONTRAST TECHNIQUE: Multiplanar and multiecho pulse sequences of the cervical spine, to include the craniocervical junction and cervicothoracic junction, were obtained without and with intravenous contrast. CONTRAST:  5 mL Gadavist COMPARISON:  Thoracic spine MRI 07/30/2018 FINDINGS: Intermittent moderate motion artifact on axial sequences. Alignment: Cervical spine straightening.  No listhesis. Vertebrae: No fracture, suspicious osseous lesion, or significant marrow edema. Cord: Multifocal T2 hyperintensity in the cervical spinal cord including a 12 mm enhancing lesion in the left hemicord at C3-4 and a nearly 2 cm minimally enhancing lesion in the right and central aspects of the cord at C7. Lower level nonenhancing T2 hyperintensity elsewhere in the cord including in the upper thoracic spine and on the right at C2. Posterior Fossa, vertebral arteries, paraspinal tissues: Microcystic changes in the parotid glands as reported on brain MRI. Preserved vertebral artery flow voids. Disc levels: C2-3: Negative. C3-4: Negative. C4-5: Mild disc space narrowing. Mild disc bulging and minimal uncovertebral spurring without significant stenosis. C5-6: Mild disc space narrowing. Mild disc bulging and uncovertebral spurring without significant stenosis. C6-7: Moderate disc space narrowing. Disc bulging, a small left posterolateral disc protrusion, and uncovertebral spurring result in borderline to mild right and mild-to-moderate left neural foraminal stenosis without significant spinal stenosis. C7-T1: Shallow right paracentral disc protrusion without stenosis. IMPRESSION: 1. Multifocal T2 signal abnormality in the cervical spinal cord including enhancing lesions at C3-4 and C7. The appearance is nonspecific, however lupus myelitis, other inflammatory or infectious myelitis, and demyelinating disease are considerations. Tumor and ischemia are felt to be unlikely. 2. Cervical disc degeneration greatest at C6-7 where there is  mild-to-moderate left neural foraminal stenosis. No significant spinal stenosis. Electronically Signed   By: Logan Bores M.D.   On: 07/31/2018 20:52   US Arterial Abi (screening Lower Extremity)  Result Date: 07/22/2018 CLINICAL DATA:  Cold left leg.  Left lower extremity pain. EXAM: NONINVASIVE PHYSIOLOGIC VASCULAR STUDY OF BILATERAL LOWER EXTREMITIES TECHNIQUE: Evaluation of both lower extremities were performed at rest, including calculation of ankle-brachial indices with single level Doppler, pressure and pulse volume recording. COMPARISON:  None. FINDINGS: Right ABI:  1.02 Left ABI:  0.96 Right Lower Extremity:  Normal arterial waveforms at the ankle. Left Lower Extremity:  Normal arterial waveforms at the ankle. 1.0-1.4 Normal IMPRESSION: Normal resting ankle-brachial indices. Electronically Signed   By: Markus Daft M.D.   On: 07/22/2018 16:49   Vas [place] Lower Extremity Venous (dvt)  Result Date: 08/02/2018  Lower Venous Study Indications: Pain.  Performing Technologist: June Leap RDMS, RVT  Examination Guidelines: A complete evaluation includes B-mode imaging, spectral Doppler, color Doppler, and power Doppler as needed of all accessible portions of each vessel. Bilateral testing is considered an integral part of a complete examination. Limited examinations for reoccurring indications may be performed as noted.  Right Venous Findings: +---+---------------+---------+-----------+----------+-------+      Compressibility Phasicity Spontaneity Properties Summary  +---+---------------+---------+-----------+----------+-------+  CFV Full            Yes       Yes                             +---+---------------+---------+-----------+----------+-------+  Left Venous Findings: +---------+---------------+---------+-----------+----------+-------+            Compressibility Phasicity Spontaneity Properties Summary  +---------+---------------+---------+-----------+----------+-------+  CFV       Full  Yes        Yes                             +---------+---------------+---------+-----------+----------+-------+  SFJ       Full                                                      +---------+---------------+---------+-----------+----------+-------+  FV Prox   Full                                                      +---------+---------------+---------+-----------+----------+-------+  FV Mid    Full                                                      +---------+---------------+---------+-----------+----------+-------+  FV Distal Full                                                      +---------+---------------+---------+-----------+----------+-------+  PFV       Full                                                      +---------+---------------+---------+-----------+----------+-------+  POP       Full            Yes       Yes                             +---------+---------------+---------+-----------+----------+-------+  PTV       Full                                                      +---------+---------------+---------+-----------+----------+-------+  PERO      Full                                                      +---------+---------------+---------+-----------+----------+-------+  Gastroc   None                                             Acute    +---------+---------------+---------+-----------+----------+-------+    Summary: Right: No evidence of common femoral vein obstruction. Left: Findings consistent with acute deep vein thrombosis involving  the left gastrocnemius vein. No cystic structure found in the popliteal fossa.  *See table(s) above for measurements and observations. Electronically signed by Ruta Hinds MD on 08/02/2018 at 8:49:58 PM.    Final       Phillips Climes M.D on 08/03/2018 at 10:55 AM  Between 7am to 7pm - Pager - [phone]  After 7pm go to [email or web address] - password [facility]  [facility] -  Office  [phone]

## 2018-08-03 NOTE — Discharge Instructions (Signed)
Information on my medicine - ELIQUIS (apixaban)  This medication education was reviewed with me or my healthcare representative as part of my discharge preparation.  T  Why was Eliquis prescribed for you? Eliquis was prescribed to treat blood clots that may have been found in the veins of your legs (deep vein thrombosis) or in your lungs (pulmonary embolism) and to reduce the risk of them occurring again.  What do You need to know about Eliquis ? The starting dose is 10 mg (two 5 mg tablets) taken TWICE daily for the FIRST SEVEN (7) DAYS, then on 08/10/18 at the evening dose time the dose is reduced to ONE 5 mg tablet taken TWICE daily.  Eliquis may be taken with or without food.   Try to take the dose about the same time in the morning and in the evening. If you have difficulty swallowing the tablet whole please discuss with your pharmacist how to take the medication safely.  Take Eliquis exactly as prescribed and DO NOT stop taking Eliquis without talking to the doctor who prescribed the medication.  Stopping may increase your risk of developing a new blood clot.  Refill your prescription before you run out.  After discharge, you should have regular check-up appointments with your healthcare provider that is prescribing your Eliquis.    What do you do if you miss a dose? If a dose of ELIQUIS is not taken at the scheduled time, take it as soon as possible on the same day and twice-daily administration should be resumed. The dose should not be doubled to make up for a missed dose.  Important Safety Information A possible side effect of Eliquis is bleeding. You should call your healthcare provider right away if you experience any of the following: ? Bleeding from an injury or your nose that does not stop. ? Unusual colored urine (red or dark brown) or unusual colored stools (red or black). ? Unusual bruising for unknown reasons. ? A serious fall or if you hit your head (even if there  is no bleeding).  Some medicines may interact with Eliquis and might increase your risk of bleeding or clotting while on Eliquis. To help avoid this, consult your healthcare provider or pharmacist prior to using any new prescription or non-prescription medications, including herbals, vitamins, non-steroidal anti-inflammatory drugs (NSAIDs) and supplements.  This website has more information on Eliquis (apixaban): http://www.eliquis.com/eliquis/home

## 2018-08-03 NOTE — Progress Notes (Addendum)
Patient called out for right leg numbness w/ epigastric pain, heart rate is 68 radial, shooting pains in right leg. States she feels like she needs to burp. Patient skin is warm and dry and no signs of rash or hives.  Patient stated it all started after I started her IV solu-medrol. IV stopped. Patient now stating she has pain from her right leg all the way to her right chest and head. States her ear hurts. Consulted with charge nurse she advised that I speak to [facility]. Shanon Brow contacted and came up to see her. Patient on phone texting. Stated she was about in tears. "oh God something's not right." Patient given 0.5 mg of XANAX and she ask if it would hurt her, then stated she used to take it 3 times a day. Patient refused Tylenol.  Also had patient lay on her left side. Patients right leg is warm, with pulses. Calmed down after telling her I stopped the IV. Patient is hyper verbal. Will continue to monitor.

## 2018-08-03 NOTE — Progress Notes (Signed)
Physical Therapy Treatment Patient Details Name: Susan Hodge MRN: [id number] DOB: [birth year] Today's Date: 08/03/2018    History of Present Illness  Susan Hodge is a 68 y.o. female with medical history significant of GERD, hypothyroidism, migraines, fibromyalgia, herpes zoster, lupus presenting to the hospital for evaluation of lower extremity weakness.  Patient states she had a fall over 2 weeks ago and fell on her buttocks. MRI revealed areas of myelitis at C3-4 and C7 and changes at T2-11.    PT Comments    Pt demonstrated increased L hip adductor strength today, pt remains 1/5 with L ankle DF, hip flexors, and knee flexors. Pt did tolerate ambulation to/from the bathroom however unable to clear L foot and requires R heel elevation and hip hiking to initiate L LE advancement and minA from PT to assist with the foot. Pt indep with tolieting, minA for asc/desc from toliet due to low surface height. Pt with new complaint of burning down L LE versus numbness. Acute PT to cont to follow.    Follow Up Recommendations  CIR     Equipment Recommendations  None recommended by PT    Recommendations for Other Services Rehab consult     Precautions / Restrictions Precautions Precautions: Fall Precaution Comments: L LE numbness, weakness. Restrictions Weight Bearing Restrictions: No    Mobility  Bed Mobility Overal bed mobility: Needs Assistance Bed Mobility: Supine to Sit     Supine to sit: Min guard     General bed mobility comments: increased time, used hands to assist L LE off EOB, HOB elevated  Transfers Overall transfer level: Needs assistance Equipment used: Rolling walker (2 wheeled) Transfers: Sit to/from Stand Sit to Stand: Min guard         General transfer comment: verbal cues to push up from bed, more steady today, have to use UEs to place L foot in proper/optimal placement to stand due to impaired sensation and weakness  Ambulation/Gait Ambulation/Gait  assistance: Mod assist Gait Distance (Feet): 15 Feet(x2 (to/from bathroom)) Assistive device: Rolling walker (2 wheeled) Gait Pattern/deviations: Step-to pattern;Decreased stride length;Decreased stance time - left;Decreased step length - left;Narrow base of support;Decreased dorsiflexion - left Gait velocity: slow, fatiguing Gait velocity interpretation: <1.8 ft/sec, indicate of risk for recurrent falls General Gait Details: pt with no active DF, utilizes hip hike to advance LEs, with onset of fatigue pt requires minA to clear L foot and advance L LE, pt also raises up on R toes to advance L LE. Pt dependent on bilat UE to support during stance phase on the L LE   Stairs             Wheelchair Mobility    Modified Rankin (Stroke Patients Only)       Balance Overall balance assessment: Needs assistance Sitting-balance support: Feet supported;No upper extremity supported Sitting balance-Leahy Scale: Good     Standing balance support: Bilateral upper extremity supported Standing balance-Leahy Scale: Poor Standing balance comment: dependent on UEs and RW                            Cognition Arousal/Alertness: Awake/alert Behavior During Therapy: WFL for tasks assessed/performed Overall Cognitive Status: Within Functional Limits for tasks assessed                                        Exercises  General Comments General comments (skin integrity, edema, etc.): VSS      Pertinent Vitals/Pain Pain Assessment: 0-10 Faces Pain Scale: Hurts little more Pain Location: L hip and leg Pain Descriptors / Indicators: Burning Pain Intervention(s): Monitored during session    Home Living                      Prior Function            PT Goals (current goals can now be found in the care plan section) Progress towards PT goals: Progressing toward goals    Frequency    Min 4X/week      PT Plan Current plan remains  appropriate    Co-evaluation              AM-PAC PT "6 Clicks" Mobility   Outcome Measure  Help needed turning from your back to your side while in a flat bed without using bedrails?: A Little Help needed moving from lying on your back to sitting on the side of a flat bed without using bedrails?: A Little Help needed moving to and from a bed to a chair (including a wheelchair)?: A Little Help needed standing up from a chair using your arms (e.g., wheelchair or bedside chair)?: A Little Help needed to walk in hospital room?: A Lot Help needed climbing 3-5 steps with a railing? : A Lot 6 Click Score: 16    End of Session Equipment Utilized During Treatment: Gait belt Activity Tolerance: Patient tolerated treatment well Patient left: in chair;with call bell/phone within reach;with nursing/sitter in room Nurse Communication: Mobility status PT Visit Diagnosis: Unsteadiness on feet (R26.81);Difficulty in walking, not elsewhere classified (R26.2);Pain Pain - Right/Left: Left     Time: 8250-5397 PT Time Calculation (min) (ACUTE ONLY): 28 min  Charges:  $Gait Training: 8-22 mins $Therapeutic Activity: 8-22 mins                     Kittie Plater, PT, DPT Acute Rehabilitation Services Pager #: [phone] Office #: [phone]    Berline Lopes 08/03/2018, 10:29 AM

## 2018-08-03 NOTE — H&P (Signed)
Physical Medicine and Rehabilitation Admission H&P    Chief Complaint  Patient presents with  . Back Pain  . Hip Pain  Chief complaint: back pain  HPI: Susan Hodge is a 68 year old right-handed female with history of migraine headaches maintained on valproic acid,, fibromyalgia, lupus and  tobacco abuse. She lives alone one level home with 2 steps to entry. Independent prior to admission a good support of local family. Presented 07/31/2018 with recent fall 2 weeks ago landing on her buttocks noted progressive lower extremity weakness. Denied any bowel or bladder disturbances. MRI the brain negative for acute changes however there was a chronic right MCA infarction. CT lumbar thoracic and cervical spine showed multifocal T2 signal abnormality and cervical spinal cord including enhancing lesion at C3-4 and 5 as well as C7. Cervical disc degeneration C6-7. Lumbar puncture completed WBC 14 lymphs 83,, glucose 64. Venous Doppler studies lower extremity showed acute DVT left gastrocnemius vein. Patient placed on therapeutic dose of Lovenox with plan to transition to Eliquis 08/03/2018. Neurology consulted workup for myelitis at C3-4 and C7. Placed on IV Solu-Medrol through 08/04/2018 and stop Therapy evaluations completed with recommendations of physical medicine rehabilitation consult. Patient was admitted for a comprehensive rehabilitation program.  Review of Systems  Constitutional: Negative for chills and fever.  HENT: Positive for hearing loss.   Eyes: Negative for blurred vision and double vision.  Respiratory: Negative for cough and shortness of breath.   Cardiovascular: Positive for palpitations.  Gastrointestinal: Positive for constipation. Negative for heartburn, nausea and vomiting.       GERD  Genitourinary: Negative for dysuria, flank pain and hematuria.  Musculoskeletal: Positive for myalgias.       Recent fall landing on buttocks  Skin: Negative for rash.  Neurological:  Positive for weakness and headaches.  Psychiatric/Behavioral: Positive for depression. The patient has insomnia.   All other systems reviewed and are negative.  Past Medical History:  Diagnosis Date  . Chest pain   . Depression   . DJD (degenerative joint disease)   . FH: CVA (cerebrovascular accident)    father  . Fibromyalgia   . GERD (gastroesophageal reflux disease)   . Hypothyroidism   . Lupus ([place])   . Migraines   . Palpitations   . Status post cholecystectomy    appendectomy and bilaateral oophorectomy  . Tobacco use    Past Surgical History:  Procedure Laterality Date  . BILATERAL OOPHORECTOMY    . CARDIAC CATHETERIZATION    . CHOLECYSTECTOMY     Family History  Problem Relation Age of Onset  . Stroke Father    Social History:  reports that she has been smoking. She has never used smokeless tobacco. She reports that she does not drink alcohol or use drugs. Allergies:  Allergies  Allergen Reactions  . Amitriptyline Other (See Comments)  . Bee Venom Other (See Comments)  . Fexofenadine Other (See Comments)  . Furosemide Other (See Comments)  . Gabapentin Other (See Comments)  . Hydroxychloroquine Sulfate Other (See Comments)  . Morphine Other (See Comments)  . Nsaids   . Oxycodone Other (See Comments)    dizzy  . Topiramate Other (See Comments)   Medications Prior to Admission  Medication Sig Dispense Refill  . ALPRAZolam (XANAX) 0.5 MG tablet Take 0.5 mg by mouth 3 (three) times daily as needed for anxiety or sleep.     . Cyanocobalamin (VITAMIN B-12 PO) Take 2 tablets by mouth daily.    . diclofenac (VOLTAREN)  75 MG EC tablet Take 75 mg by mouth 2 (two) times daily as needed for mild pain.     . hydrOXYzine (ATARAX/VISTARIL) 10 MG tablet Take 10 mg by mouth every 8 (eight) hours as needed. For itching    . loratadine (CLARITIN) 10 MG tablet Take 10 mg by mouth daily.    . ondansetron (ZOFRAN) 4 MG tablet Take 2 mg by mouth every 8 (eight) hours as needed.  for nausea    . oxyCODONE-acetaminophen (PERCOCET) 7.5-325 MG tablet Take 1 tablet by mouth every 8 (eight) hours as needed.    . predniSONE (STERAPRED UNI-PAK 21 TAB) 10 MG (21) TBPK tablet Take by mouth daily. Take 6 tabs by mouth daily  for 2 days, then 5 tabs for 2 days, then 4 tabs for 2 days...continue to taper 42 tablet 0  . pregabalin (LYRICA) 50 MG capsule Take 50 mg by mouth 3 (three) times daily.    Marland Kitchen valproic acid (DEPAKENE) 250 MG capsule Take 250 mg by mouth 2 (two) times daily.    . Vitamin D, Ergocalciferol, (DRISDOL) 1.25 MG (50000 UT) CAPS capsule Take 1 capsule by mouth every Monday.     . vitamin E 1000 UNIT capsule Take 1,000 Units by mouth daily.    . valACYclovir (VALTREX) 1000 MG tablet Take 1,000 mg by mouth 2 (two) times daily.      Drug Regimen Review Drug regimen was reviewed and remains appropriate with no significant issues identified  Home: Home Living Family/patient expects to be discharged to:: Private residence Living Arrangements: Alone Available Help at Discharge: Family, Available 24 hours/day Type of Home: House Home Access: Stairs to enter [employer] of Steps: 2 Entrance Stairs-Rails: Left Home Layout: One level Bathroom Shower/Tub: Chiropodist: Standard Home Equipment: Tub bench, None, Wheelchair - manual, Bedside commode Additional Comments: family providing 24/7   Functional History: Prior Function Level of Independence: Independent Comments: was indep until 2 weeks when she fell 2 weeks ago, now family is with her 24/7 providing assist, was driving and going to grocery store  Functional Status:  Mobility: Bed Mobility Overal bed mobility: Needs Assistance Bed Mobility: Supine to Sit Supine to sit: Min guard General bed mobility comments: increased time, used hands to assist L LE off EOB, HOB elevated Transfers Overall transfer level: Needs assistance Equipment used: Rolling walker (2 wheeled)  Transfers: Sit to/from Stand Sit to Stand: Min guard Stand pivot transfers: Min assist General transfer comment: verbal cues to push up from bed, more steady today, have to use UEs to place L foot in proper/optimal placement to stand due to impaired sensation and weakness Ambulation/Gait Ambulation/Gait assistance: Mod assist Gait Distance (Feet): 15 Feet(x2 (to/from bathroom)) Assistive device: Rolling walker (2 wheeled) Gait Pattern/deviations: Step-to pattern, Decreased stride length, Decreased stance time - left, Decreased step length - left, Narrow base of support, Decreased dorsiflexion - left General Gait Details: pt with no active DF, utilizes hip hike to advance LEs, with onset of fatigue pt requires minA to clear L foot and advance L LE, pt also raises up on R toes to advance L LE. Pt dependent on bilat UE to support during stance phase on the L LE Gait velocity: slow, fatiguing Gait velocity interpretation: <1.8 ft/sec, indicate of risk for recurrent falls    ADL: ADL Overall ADL's : Needs assistance/impaired Eating/Feeding: Set up, Sitting Grooming: Set up, Sitting Upper Body Bathing: Min guard, Sitting Lower Body Bathing: Minimal assistance, Sit to/from stand Upper Body  Dressing : Set up, Min guard, Sitting Upper Body Dressing Details (indicate cue type and reason): donning new gown Lower Body Dressing: Minimal assistance, Sit to/from stand Lower Body Dressing Details (indicate cue type and reason): pt donning mesh underwear, she is able to physically lift LLE with her UEs to place into underwear, minA for standing balance; pt requires increased time to steady in standing and then is able to advance underwear over her hips using RUE, and with light minA Toilet Transfer: Minimal assistance, Ambulation Toilet Transfer Details (indicate cue type and reason): pt is able to peform mobility from chair in front of sink to recliner, hiking hips and coming up onto toes when stepping to  assist with advancing LEs - increased difficulty backing up towards recliner vs going forward Toileting- Clothing Manipulation and Hygiene: Minimal assistance, Moderate assistance, Sit to/from stand Functional mobility during ADLs: Minimal assistance, Rolling walker General ADL Comments: pt with increased LE weakness, decreased standing balance; pt just completed bathing with NT assist upon arrival - assisted with completion of ADL tasks and returned to recliner end of session  Cognition: Cognition Overall Cognitive Status: Within Functional Limits for tasks assessed Orientation Level: Oriented X4 Cognition Arousal/Alertness: Awake/alert Behavior During Therapy: WFL for tasks assessed/performed Overall Cognitive Status: Within Functional Limits for tasks assessed General Comments: pt is chatty, needs redirection to task at hand but overall WFL  Physical Exam: Blood pressure (!) 92/46, pulse 65, temperature 98.2 F (36.8 C), temperature source Oral, resp. rate 18, height 5' (1.524 m), weight 57.1 kg, SpO2 99 %. Physical Exam  Constitutional: She is oriented to person, place, and time. She appears well-developed and well-nourished.  HENT:  Head: Normocephalic.  Eyes: Pupils are equal, round, and reactive to light. EOM are normal.  Neck: Normal range of motion. No tracheal deviation present. No thyromegaly present.  Cardiovascular: Normal rate and regular rhythm.  No murmur heard. Respiratory: Effort normal and breath sounds normal. No respiratory distress. She has no wheezes.  GI: Soft. Bowel sounds are normal. She exhibits no distension. There is no abdominal tenderness.  Musculoskeletal: Normal range of motion.  Neurological: She is alert and oriented to person, place, and time.  UE bilaterally 5/5. RLE 4+/5 prox to distal. LLE 3-HF, 3/5 KE, 2/5 KF, 2/5 APF, tr ADF. Decreased sensation Left foot, leg, saddle---1+/2. DTR's 2+  Skin: Skin is warm.  Psychiatric:  Pleasant but anxious  and tangential.     Results for orders placed or performed during the hospital encounter of 07/31/18 (from the past 48 hour(s))  Angiotensin converting enzyme     Status: None   Collection Time: 08/01/18  1:41 PM  Result Value Ref Range   Angiotensin-Converting Enzyme 40 14 - 82 U/L    Comment: (NOTE) Performed At: [facility] [place], [place] [id number] Rush Farmer MD [id number]   Vitamin B12     Status: Abnormal   Collection Time: 08/01/18  1:41 PM  Result Value Ref Range   Vitamin B-12 1,457 (H) 180 - 914 pg/mL    Comment: (NOTE) This assay is not validated for testing neonatal or myeloproliferative syndrome specimens for Vitamin B12 levels. Performed at [facility], [place] [address] [place], [place] [address]   Copper, serum     Status: None   Collection Time: 08/01/18  1:41 PM  Result Value Ref Range   Copper 121 72 - 166 ug/dL    Comment: (NOTE) This test was developed and its performance characteristics determined  by [employer]. It has not been cleared or approved by the Food and Drug Administration.                                Detection Limit = 5 Performed At: [facility] [place], [place] [id number] Rush Farmer MD [id number]   CBC     Status: None   Collection Time: 08/02/18  7:25 AM  Result Value Ref Range   WBC 9.6 4.0 - 10.5 K/uL   RBC 4.12 3.87 - 5.11 MIL/uL   Hemoglobin 12.9 12.0 - 15.0 g/dL   HCT 38.1 36.0 - 46.0 %   MCV 92.5 80.0 - 100.0 fL   MCH 31.3 26.0 - 34.0 pg   MCHC 33.9 30.0 - 36.0 g/dL   RDW 12.8 11.5 - 15.5 %   Platelets 203 150 - 400 K/uL   nRBC 0.0 0.0 - 0.2 %    Comment: Performed at [facility], [place] [address], [place], [place] [address]  Basic metabolic panel     Status: Abnormal   Collection Time: 08/02/18  7:25 AM  Result Value Ref Range   Sodium 138 135 - 145 mmol/L   Potassium 4.2 3.5 - 5.1 mmol/L   Chloride 102 98 - 111 mmol/L   CO2 26 22 - 32 mmol/L    Glucose, Bld 133 (H) 70 - 99 mg/dL   BUN 27 (H) 8 - 23 mg/dL   Creatinine, Ser 0.91 0.44 - 1.00 mg/dL   Calcium 9.3 8.9 - 10.3 mg/dL   GFR calc non Af Amer >60 >60 mL/min   GFR calc Af Amer >60 >60 mL/min   Anion gap 10 5 - 15    Comment: Performed at [facility], [place] [address], [place], [place] [address]   No results found.     Medical Problem List and Plan: 1.  Left side weakness and hemisensory deficits secondary to C3-4, C7 myelitis. IV Solu-Medrol  ends 08/04/2018  -admit to inpatient rehab 2.  Antithrombotics: -DVT/anticoagulation:  Acute DVT left gastrocnemius vein. Transition from Lovenox to Eliquis today  -antiplatelet therapy: N/A 3. Pain Management/fibromyalgia/migraine headaches:Valproic acid 250 mg twice a day Lyrica 50 mg 3 times a day,oxycodone as needed 4. Mood: Xanax 0.5 mg 3 times a day as needed  -antipsychotic agents: N/A 5. Neuropsych: This patient is capable of making decisions on her own behalf. 6. Skin/Wound Care:  Routine skin checks 7. Fluids/Electrolytes/Nutrition:  Routine in and out's with follow-up chemistries 8. GERD. Protonix 9. Tobacco abuse. Counseling      Cathlyn Parsons, PA-C 08/03/2018

## 2018-08-03 NOTE — H&P (Signed)
Physical Medicine and Rehabilitation Admission H&P        Chief Complaint  Patient presents with  . Back Pain  . Hip Pain  Chief complaint: back pain   HPI: Susan Hodge is a 68 year old right-handed female with history of migraine headaches maintained on valproic acid,, fibromyalgia, lupus and  tobacco abuse. She lives alone one level home with 2 steps to entry. Independent prior to admission a good support of local family. Presented 07/31/2018 with recent fall 2 weeks ago landing on her buttocks noted progressive lower extremity weakness. Denied any bowel or bladder disturbances. MRI the brain negative for acute changes however there was a chronic right MCA infarction. CT lumbar thoracic and cervical spine showed multifocal T2 signal abnormality and cervical spinal cord including enhancing lesion at C3-4 and 5 as well as C7. Cervical disc degeneration C6-7. Lumbar puncture completed WBC 14 lymphs 83,, glucose 64. Venous Doppler studies lower extremity showed acute DVT left gastrocnemius vein. Patient placed on therapeutic dose of Lovenox with plan to transition to Eliquis 08/03/2018. Neurology consulted workup for myelitis at C3-4 and C7. Placed on IV Solu-Medrol through 08/04/2018 and stop Therapy evaluations completed with recommendations of physical medicine rehabilitation consult. Patient was admitted for a comprehensive rehabilitation program.   Review of Systems  Constitutional: Negative for chills and fever.  HENT: Positive for hearing loss.   Eyes: Negative for blurred vision and double vision.  Respiratory: Negative for cough and shortness of breath.   Cardiovascular: Positive for palpitations.  Gastrointestinal: Positive for constipation. Negative for heartburn, nausea and vomiting.       GERD  Genitourinary: Negative for dysuria, flank pain and hematuria.  Musculoskeletal: Positive for myalgias.       Recent fall landing on buttocks  Skin: Negative for rash.   Neurological: Positive for weakness and headaches.  Psychiatric/Behavioral: Positive for depression. The patient has insomnia.   All other systems reviewed and are negative.       Past Medical History:  Diagnosis Date  . Chest pain    . Depression    . DJD (degenerative joint disease)    . FH: CVA (cerebrovascular accident)      father  . Fibromyalgia    . GERD (gastroesophageal reflux disease)    . Hypothyroidism    . Lupus ([place])    . Migraines    . Palpitations    . Status post cholecystectomy      appendectomy and bilaateral oophorectomy  . Tobacco use           Past Surgical History:  Procedure Laterality Date  . BILATERAL OOPHORECTOMY      . CARDIAC CATHETERIZATION      . CHOLECYSTECTOMY             Family History  Problem Relation Age of Onset  . Stroke Father      Social History:  reports that she has been smoking. She has never used smokeless tobacco. She reports that she does not drink alcohol or use drugs. Allergies:       Allergies  Allergen Reactions  . Amitriptyline Other (See Comments)  . Bee Venom Other (See Comments)  . Fexofenadine Other (See Comments)  . Furosemide Other (See Comments)  . Gabapentin Other (See Comments)  . Hydroxychloroquine Sulfate Other (See Comments)  . Morphine Other (See Comments)  . Nsaids    . Oxycodone Other (See Comments)      dizzy  . Topiramate Other (See  Comments)          Medications Prior to Admission  Medication Sig Dispense Refill  . ALPRAZolam (XANAX) 0.5 MG tablet Take 0.5 mg by mouth 3 (three) times daily as needed for anxiety or sleep.       . Cyanocobalamin (VITAMIN B-12 PO) Take 2 tablets by mouth daily.      . diclofenac (VOLTAREN) 75 MG EC tablet Take 75 mg by mouth 2 (two) times daily as needed for mild pain.       . hydrOXYzine (ATARAX/VISTARIL) 10 MG tablet Take 10 mg by mouth every 8 (eight) hours as needed. For itching      . loratadine (CLARITIN) 10 MG tablet Take 10 mg by mouth daily.      .  ondansetron (ZOFRAN) 4 MG tablet Take 2 mg by mouth every 8 (eight) hours as needed. for nausea      . oxyCODONE-acetaminophen (PERCOCET) 7.5-325 MG tablet Take 1 tablet by mouth every 8 (eight) hours as needed.      . predniSONE (STERAPRED UNI-PAK 21 TAB) 10 MG (21) TBPK tablet Take by mouth daily. Take 6 tabs by mouth daily  for 2 days, then 5 tabs for 2 days, then 4 tabs for 2 days...continue to taper 42 tablet 0  . pregabalin (LYRICA) 50 MG capsule Take 50 mg by mouth 3 (three) times daily.      Marland Kitchen valproic acid (DEPAKENE) 250 MG capsule Take 250 mg by mouth 2 (two) times daily.      . Vitamin D, Ergocalciferol, (DRISDOL) 1.25 MG (50000 UT) CAPS capsule Take 1 capsule by mouth every Monday.       . vitamin E 1000 UNIT capsule Take 1,000 Units by mouth daily.      . valACYclovir (VALTREX) 1000 MG tablet Take 1,000 mg by mouth 2 (two) times daily.          Drug Regimen Review Drug regimen was reviewed and remains appropriate with no significant issues identified   Home: Home Living Family/patient expects to be discharged to:: Private residence Living Arrangements: Alone Available Help at Discharge: Family, Available 24 hours/day Type of Home: House Home Access: Stairs to enter [employer] of Steps: 2 Entrance Stairs-Rails: Left Home Layout: One level Bathroom Shower/Tub: Chiropodist: Standard Home Equipment: Tub bench, None, Wheelchair - manual, Bedside commode Additional Comments: family providing 24/7   Functional History: Prior Function Level of Independence: Independent Comments: was indep until 2 weeks when she fell 2 weeks ago, now family is with her 24/7 providing assist, was driving and going to grocery store   Functional Status:  Mobility: Bed Mobility Overal bed mobility: Needs Assistance Bed Mobility: Supine to Sit Supine to sit: Min guard General bed mobility comments: increased time, used hands to assist L LE off EOB, HOB elevated  Transfers Overall transfer level: Needs assistance Equipment used: Rolling walker (2 wheeled) Transfers: Sit to/from Stand Sit to Stand: Min guard Stand pivot transfers: Min assist General transfer comment: verbal cues to push up from bed, more steady today, have to use UEs to place L foot in proper/optimal placement to stand due to impaired sensation and weakness Ambulation/Gait Ambulation/Gait assistance: Mod assist Gait Distance (Feet): 15 Feet(x2 (to/from bathroom)) Assistive device: Rolling walker (2 wheeled) Gait Pattern/deviations: Step-to pattern, Decreased stride length, Decreased stance time - left, Decreased step length - left, Narrow base of support, Decreased dorsiflexion - left General Gait Details: pt with no active DF, utilizes hip hike to advance  LEs, with onset of fatigue pt requires minA to clear L foot and advance L LE, pt also raises up on R toes to advance L LE. Pt dependent on bilat UE to support during stance phase on the L LE Gait velocity: slow, fatiguing Gait velocity interpretation: <1.8 ft/sec, indicate of risk for recurrent falls   ADL: ADL Overall ADL's : Needs assistance/impaired Eating/Feeding: Set up, Sitting Grooming: Set up, Sitting Upper Body Bathing: Min guard, Sitting Lower Body Bathing: Minimal assistance, Sit to/from stand Upper Body Dressing : Set up, Min guard, Sitting Upper Body Dressing Details (indicate cue type and reason): donning new gown Lower Body Dressing: Minimal assistance, Sit to/from stand Lower Body Dressing Details (indicate cue type and reason): pt donning mesh underwear, she is able to physically lift LLE with her UEs to place into underwear, minA for standing balance; pt requires increased time to steady in standing and then is able to advance underwear over her hips using RUE, and with light minA Toilet Transfer: Minimal assistance, Ambulation Toilet Transfer Details (indicate cue type and reason): pt is able to peform  mobility from chair in front of sink to recliner, hiking hips and coming up onto toes when stepping to assist with advancing LEs - increased difficulty backing up towards recliner vs going forward Toileting- Clothing Manipulation and Hygiene: Minimal assistance, Moderate assistance, Sit to/from stand Functional mobility during ADLs: Minimal assistance, Rolling walker General ADL Comments: pt with increased LE weakness, decreased standing balance; pt just completed bathing with NT assist upon arrival - assisted with completion of ADL tasks and returned to recliner end of session   Cognition: Cognition Overall Cognitive Status: Within Functional Limits for tasks assessed Orientation Level: Oriented X4 Cognition Arousal/Alertness: Awake/alert Behavior During Therapy: WFL for tasks assessed/performed Overall Cognitive Status: Within Functional Limits for tasks assessed General Comments: pt is chatty, needs redirection to task at hand but overall WFL   Physical Exam: Blood pressure (!) 92/46, pulse 65, temperature 98.2 F (36.8 C), temperature source Oral, resp. rate 18, height 5' (1.524 m), weight 57.1 kg, SpO2 99 %. Physical Exam  Constitutional: She is oriented to person, place, and time. She appears well-developed and well-nourished.  HENT:  Head: Normocephalic.  Eyes: Pupils are equal, round, and reactive to light. EOM are normal.  Neck: Normal range of motion. No tracheal deviation present. No thyromegaly present.  Cardiovascular: Normal rate and regular rhythm.  No murmur heard. Respiratory: Effort normal and breath sounds normal. No respiratory distress. She has no wheezes.  GI: Soft. Bowel sounds are normal. She exhibits no distension. There is no abdominal tenderness.  Musculoskeletal: Normal range of motion.  Neurological: She is alert and oriented to person, place, and time.  UE bilaterally 5/5. RLE 4+/5 prox to distal. LLE 3-HF, 3/5 KE, 2/5 KF, 2/5 APF, tr ADF. Decreased  sensation Left foot, leg, saddle---1+/2. DTR's 2+  Skin: Skin is warm.  Psychiatric:  Pleasant but anxious and tangential.       Lab Results Last 48 Hours        Results for orders placed or performed during the hospital encounter of 07/31/18 (from the past 48 hour(s))  Angiotensin converting enzyme     Status: None    Collection Time: 08/01/18  1:41 PM  Result Value Ref Range    Angiotensin-Converting Enzyme 40 14 - 82 U/L      Comment: (NOTE) Performed At: [facility] [place], [place] [id number] Rush Farmer MD EY:[id number]  Vitamin B12     Status: Abnormal    Collection Time: 08/01/18  1:41 PM  Result Value Ref Range    Vitamin B-12 1,457 (H) 180 - 914 pg/mL      Comment: (NOTE) This assay is not validated for testing neonatal or myeloproliferative syndrome specimens for Vitamin B12 levels. Performed at [facility], [place] [address] [place], [place] [address]    Copper, serum     Status: None    Collection Time: 08/01/18  1:41 PM  Result Value Ref Range    Copper 121 72 - 166 ug/dL      Comment: (NOTE) This test was developed and its performance characteristics determined by [facility]. It has not been cleared or approved by the Food and Drug Administration.                                Detection Limit = 5 Performed At: [facility] [place], [place] [id number] Rush Farmer MD [id number]    CBC     Status: None    Collection Time: 08/02/18  7:25 AM  Result Value Ref Range    WBC 9.6 4.0 - 10.5 K/uL    RBC 4.12 3.87 - 5.11 MIL/uL    Hemoglobin 12.9 12.0 - 15.0 g/dL    HCT 38.1 36.0 - 46.0 %    MCV 92.5 80.0 - 100.0 fL    MCH 31.3 26.0 - 34.0 pg    MCHC 33.9 30.0 - 36.0 g/dL    RDW 12.8 11.5 - 15.5 %    Platelets 203 150 - 400 K/uL    nRBC 0.0 0.0 - 0.2 %      Comment: Performed at [facility], [place] [address] [place], [place] [address]  Basic metabolic panel     Status: Abnormal    Collection  Time: 08/02/18  7:25 AM  Result Value Ref Range    Sodium 138 135 - 145 mmol/L    Potassium 4.2 3.5 - 5.1 mmol/L    Chloride 102 98 - 111 mmol/L    CO2 26 22 - 32 mmol/L    Glucose, Bld 133 (H) 70 - 99 mg/dL    BUN 27 (H) 8 - 23 mg/dL    Creatinine, Ser 0.91 0.44 - 1.00 mg/dL    Calcium 9.3 8.9 - 10.3 mg/dL    GFR calc non Af Amer >60 >60 mL/min    GFR calc Af Amer >60 >60 mL/min    Anion gap 10 5 - 15      Comment: Performed at [facility], [place] [address], [place], [place] [address]      Imaging Results (Last 48 hours)  No results found.           Medical Problem List and Plan: 1.  Left side weakness and hemisensory deficits secondary to C3-4, C7 myelitis. IV Solu-Medrol  ends 08/04/2018             -admit to inpatient rehab 2.  Antithrombotics: -DVT/anticoagulation:  Acute DVT left gastrocnemius vein. Transition from Lovenox to Eliquis today             -antiplatelet therapy: N/A 3. Pain Management/fibromyalgia/migraine headaches:Valproic acid 250 mg twice a day Lyrica 50 mg 3 times a day,oxycodone as needed 4. Mood: Xanax 0.5 mg 3 times a day as needed             -  antipsychotic agents: N/A 5. Neuropsych: This patient is capable of making decisions on her own behalf. 6. Skin/Wound Care:  Routine skin checks 7. Fluids/Electrolytes/Nutrition:  Routine in and out's with follow-up chemistries 8. GERD. Protonix 9. Tobacco abuse. Counseling      Post Admission Physician Evaluation: 1. Functional deficits secondary  to cervical myelopathy, primarily affecting the lower extremities left more than right. 2. Patient is admitted to receive collaborative, interdisciplinary care between the physiatrist, rehab nursing staff, and therapy team. 3. Patient's level of medical complexity and substantial therapy needs in context of that medical necessity cannot be provided at a lesser intensity of care such as a SNF. 4. Patient has experienced substantial functional loss from his/her  baseline which was documented above under the "Functional History" and "Functional Status" headings.  Judging by the patient's diagnosis, physical exam, and functional history, the patient has potential for functional progress which will result in measurable gains while on inpatient rehab.  These gains will be of substantial and practical use upon discharge  in facilitating mobility and self-care at the household level. 5. Physiatrist will provide 24 hour management of medical needs as well as oversight of the therapy plan/treatment and provide guidance as appropriate regarding the interaction of the two. 6. The Preadmission Screening has been reviewed and patient status is unchanged unless otherwise stated above. 7. 24 hour rehab nursing will assist with bladder management, bowel management, safety, skin/wound care, disease management, medication administration, pain management and patient education  and help integrate therapy concepts, techniques,education, etc. 8. PT will assess and treat for/with: Lower extremity strength, range of motion, stamina, balance, functional mobility, safety, adaptive techniques and equipment, NMR, orthotics.   Goals are: mod I. 9. OT will assess and treat for/with: ADL's, functional mobility, safety, upper extremity strength, adaptive techniques and equipment, NMR, community reentry.   Goals are: mod I. Therapy may proceed with showering this patient. 10. SLP will assess and treat for/with: n/a.  Goals are: n/a. 11. Case Management and Social Worker will assess and treat for psychological issues and discharge planning. 12. Team conference will be held weekly to assess progress toward goals and to determine barriers to discharge. 13. Patient will receive at least 3 hours of therapy per day at least 5 days per week. 14. ELOS: 8-13 days       15. Prognosis:  excellent   I have personally performed a face to face diagnostic evaluation of this patient and formulated the key  components of the plan.  Additionally, I have personally reviewed laboratory data, imaging studies, as well as relevant notes and concur with the physician assistant's documentation above.  Meredith Staggers, MD, FAAPMR     Lavon Paganini Cresbard, PA-C 08/03/2018

## 2018-08-03 NOTE — Progress Notes (Signed)
Meredith Staggers, MD  Physician  Physical Medicine and Rehabilitation  Consult Note  Signed  Date of Service:  08/02/2018 9:43 AM       Related encounter: ED to Hosp-Admission (Current) from 07/31/2018 in [facility]      Signed      Expand All Collapse All    Show:Clear all [x] Manual[x] Template[] Copied  Added by: [x] Angiulli, Lavon Paganini, PA-C[x] Meredith Staggers, MD  [] Hover for details      Physical Medicine and Rehabilitation Consult Reason for Consult:Decreased functional mobility Referring Physician: triad   HPI: Susan Hodge is a 68 y.o.right handed female with history of hypothyroidism, fibromyalgia, lupus. Per chart review patient lives alone. One level home to steps to entry. Independent prior to admission.Good support of local family. Presented 07/31/2018 with recent fall 2 weeks ago landing on her buttocks and noted progressive lower extremity weakness. Denied any bowel or bladder disturbances. MRI the brain negative for acute changes. CT lumbar thoracic and cervical spine showed multifocal T2 signal abnormality in the cervical spinal cord including enhancing lesion at C3-4-5 and C7. Cervical disc degeneration C6-7.lumbar puncture completed WBC 14, lymphs 83 for results pending. venous Doppler studies lower extremity showed acute DVT left gastrocnemius vein. Patient placed on therapeutic dose of Lovenox. Neurology consulted and workup for myelitis at C3-4 and C7. IV Solu-Medrol 5 days. Therapy evaluations completed recommendations of physical medicine rehabilitation consult.   Review of Systems  Constitutional: Negative for chills and fever.  HENT: Positive for hearing loss.   Eyes: Negative for blurred vision and double vision.  Respiratory: Negative for shortness of breath.   Cardiovascular: Positive for palpitations. Negative for chest pain and leg swelling.  Gastrointestinal: Positive for constipation. Negative for heartburn,  nausea and vomiting.       GERD   Genitourinary: Negative for dysuria, flank pain and hematuria.  Musculoskeletal: Positive for myalgias.  Neurological: Positive for weakness and headaches.  Psychiatric/Behavioral: Positive for depression.  All other systems reviewed and are negative.      Past Medical History:  Diagnosis Date   Chest pain    Depression    DJD (degenerative joint disease)    FH: CVA (cerebrovascular accident)    father   Fibromyalgia    GERD (gastroesophageal reflux disease)    Hypothyroidism    Lupus (HCC)    Migraines    Palpitations    Status post cholecystectomy    appendectomy and bilaateral oophorectomy   Tobacco use         Past Surgical History:  Procedure Laterality Date   BILATERAL OOPHORECTOMY     CARDIAC CATHETERIZATION     CHOLECYSTECTOMY     Family History  Problem Relation Age of Onset   Stroke Father    Social History:  reports that she has been smoking. She has never used smokeless tobacco. She reports that she does not drink alcohol or use drugs. Allergies:       Allergies  Allergen Reactions   Amitriptyline Other (See Comments)   Bee Venom Other (See Comments)   Fexofenadine Other (See Comments)   Furosemide Other (See Comments)   Gabapentin Other (See Comments)   Hydroxychloroquine Sulfate Other (See Comments)   Morphine Other (See Comments)   Nsaids    Oxycodone Other (See Comments)    dizzy   Topiramate Other (See Comments)         Medications Prior to Admission  Medication Sig Dispense Refill   ALPRAZolam (XANAX) 0.5 MG  tablet Take 0.5 mg by mouth 3 (three) times daily as needed for anxiety or sleep.      Cyanocobalamin (VITAMIN B-12 PO) Take 2 tablets by mouth daily.     diclofenac (VOLTAREN) 75 MG EC tablet Take 75 mg by mouth 2 (two) times daily as needed for mild pain.      hydrOXYzine (ATARAX/VISTARIL) 10 MG tablet Take 10 mg by mouth every 8  (eight) hours as needed. For itching     loratadine (CLARITIN) 10 MG tablet Take 10 mg by mouth daily.     ondansetron (ZOFRAN) 4 MG tablet Take 2 mg by mouth every 8 (eight) hours as needed. for nausea     oxyCODONE-acetaminophen (PERCOCET) 7.5-325 MG tablet Take 1 tablet by mouth every 8 (eight) hours as needed.     predniSONE (STERAPRED UNI-PAK 21 TAB) 10 MG (21) TBPK tablet Take by mouth daily. Take 6 tabs by mouth daily  for 2 days, then 5 tabs for 2 days, then 4 tabs for 2 days...continue to taper 42 tablet 0   pregabalin (LYRICA) 50 MG capsule Take 50 mg by mouth 3 (three) times daily.     valproic acid (DEPAKENE) 250 MG capsule Take 250 mg by mouth 2 (two) times daily.     Vitamin D, Ergocalciferol, (DRISDOL) 1.25 MG (50000 UT) CAPS capsule Take 1 capsule by mouth every Monday.      vitamin E 1000 UNIT capsule Take 1,000 Units by mouth daily.     valACYclovir (VALTREX) 1000 MG tablet Take 1,000 mg by mouth 2 (two) times daily.      Home: Home Living Family/patient expects to be discharged to:: Private residence Living Arrangements: Alone Available Help at Discharge: Family, Available 24 hours/day Type of Home: House Home Access: Stairs to enter [employer] of Steps: 2 Entrance Stairs-Rails: Left Home Layout: One level Bathroom Shower/Tub: Chiropodist: Standard Home Equipment: Tub bench, None, Wheelchair - manual Additional Comments: family providing 24/l7  Functional History: Prior Function Level of Independence: Independent Comments: was indep until 2 weeks when she fell 2 weeks ago, now family is with her 24/7 providing assist, was driving and going to grocery store Functional Status:  Mobility: Bed Mobility Overal bed mobility: Needs Assistance Bed Mobility: Supine to Sit Supine to sit: Min guard General bed mobility comments: HOB elevated, pt unable to move L LE to EOB without using UEs to assist LE off  EOB Transfers Overall transfer level: Needs assistance Equipment used: Rolling walker (2 wheeled) Transfers: Stand Pivot Transfers, Sit to/from Stand Sit to Stand: Min assist Stand pivot transfers: Min assist General transfer comment: pt unable to bear full wight on L LE due to weakness and buckling. Pt pivots on ball of R LE and "dragged" L LE. Pt unable to clear L foot without maxA to advance LE. pt with good walker management. Pt unable to advance R LE/clear foot due to L knee buckling with L LE in stance phase Ambulation/Gait General Gait Details: unable at this time  ADL:  Cognition: Cognition Overall Cognitive Status: Within Functional Limits for tasks assessed Orientation Level: Oriented X4 Cognition Arousal/Alertness: Awake/alert Behavior During Therapy: WFL for tasks assessed/performed Overall Cognitive Status: Within Functional Limits for tasks assessed  Blood pressure 104/63, pulse 66, temperature 97.8 F (36.6 C), temperature source Oral, resp. rate 18, height 5' (1.524 m), weight 57.1 kg, SpO2 100 %. Physical Exam  Constitutional: She appears well-developed and well-nourished.  HENT:  Head: Normocephalic and atraumatic.  Eyes:  Pupils are equal, round, and reactive to light. EOM are normal.  Neck: Normal range of motion. No thyromegaly present.  Cardiovascular: Normal rate and regular rhythm.  Respiratory: Effort normal and breath sounds normal.  GI: Soft. She exhibits no distension. There is no abdominal tenderness.  Musculoskeletal:        General: No edema.     Comments: Cognitively appropriate but tangential. UE 4+/5 bilaterally, RLE: 4/5 prox to distal. LLE: 2/5 HF, KE and 1-2 ADF/PF. Sensory 2/2 RUE and RLE. LUE 1+/2, LLE 1+/2, decreased LT in saddle area/buttocks also, left more than right.  DTr's 1+ in all 4's.   Neurological:  Alert. Sitting up in bed in no acute distress. Alert and oriented 3  Skin: Skin is warm.  Psychiatric: She has a normal mood and  affect. Her behavior is normal. Judgment and thought content normal.    LabResultsLast24Hours  Results for orders placed or performed during the hospital encounter of 07/31/18 (from the past 24 hour(s))  Vitamin B12     Status: Abnormal   Collection Time: 08/01/18  1:41 PM  Result Value Ref Range   Vitamin B-12 1,457 (H) 180 - 914 pg/mL  CBC     Status: None   Collection Time: 08/02/18  7:25 AM  Result Value Ref Range   WBC 9.6 4.0 - 10.5 K/uL   RBC 4.12 3.87 - 5.11 MIL/uL   Hemoglobin 12.9 12.0 - 15.0 g/dL   HCT 38.1 36.0 - 46.0 %   MCV 92.5 80.0 - 100.0 fL   MCH 31.3 26.0 - 34.0 pg   MCHC 33.9 30.0 - 36.0 g/dL   RDW 12.8 11.5 - 15.5 %   Platelets 203 150 - 400 K/uL   nRBC 0.0 0.0 - 0.2 %  Basic metabolic panel     Status: Abnormal   Collection Time: 08/02/18  7:25 AM  Result Value Ref Range   Sodium 138 135 - 145 mmol/L   Potassium 4.2 3.5 - 5.1 mmol/L   Chloride 102 98 - 111 mmol/L   CO2 26 22 - 32 mmol/L   Glucose, Bld 133 (H) 70 - 99 mg/dL   BUN 27 (H) 8 - 23 mg/dL   Creatinine, Ser 0.91 0.44 - 1.00 mg/dL   Calcium 9.3 8.9 - 10.3 mg/dL   GFR calc non Af Amer >60 >60 mL/min   GFR calc Af Amer >60 >60 mL/min   Anion gap 10 5 - 15      ImagingResults(Last48hours)  Mr Jeri Cos And Wo Contrast  Result Date: 07/31/2018 CLINICAL DATA:  Myelopathy. Left leg weakness and numbness. Urinary incontinence. EXAM: MRI HEAD WITHOUT AND WITH CONTRAST TECHNIQUE: Multiplanar, multiecho pulse sequences of the brain and surrounding structures were obtained without and with intravenous contrast. CONTRAST:  5 mL Gadavist COMPARISON:  None. FINDINGS: Brain: No acute infarct, mass, midline shift, or extra-axial fluid collection is identified. There is a small to moderate-sized chronic right MCA infarct involving the frontal operculum and insula with a small amount of hemosiderin staining. Small foci of T2 hyperintensity scattered elsewhere in the cerebral  white matter bilaterally, most notably in the left corona radiata, are at most mildly advanced for age. No abnormal brain parenchymal or meningeal enhancement is identified. There is mild global cerebral atrophy. Vascular: Major intracranial vascular flow voids are preserved. Skull and upper cervical spine: Unremarkable bone marrow signal. Sinuses/Orbits: Unremarkable orbits. Mild circumferential mucosal thickening and moderate volume fluid in the left maxillary sinus which appears small. Trace  right mastoid effusion. Other: Diffuse nodularity/microcystic changes throughout both parotid glands. IMPRESSION: 1. No acute intracranial abnormality. 2. Chronic right MCA infarct. 3. Cerebral white matter T2 signal changes, nonspecific but compatible with mild chronic small vessel ischemic disease. 4. Diffuse nodularity/microcystic changes in the parotid gland suggestive of chronic inflammation or autoimmune disease. Electronically Signed   By: Logan Bores M.D.   On: 07/31/2018 20:13   Mr Cervical Spine W Or Wo Contrast  Result Date: 07/31/2018 CLINICAL DATA:  Myelopathy. Pain after a fall earlier in the month. Left leg weakness and numbness. Urinary incontinence. History of lupus. EXAM: MRI CERVICAL SPINE WITHOUT AND WITH CONTRAST TECHNIQUE: Multiplanar and multiecho pulse sequences of the cervical spine, to include the craniocervical junction and cervicothoracic junction, were obtained without and with intravenous contrast. CONTRAST:  5 mL Gadavist COMPARISON:  Thoracic spine MRI 07/30/2018 FINDINGS: Intermittent moderate motion artifact on axial sequences. Alignment: Cervical spine straightening.  No listhesis. Vertebrae: No fracture, suspicious osseous lesion, or significant marrow edema. Cord: Multifocal T2 hyperintensity in the cervical spinal cord including a 12 mm enhancing lesion in the left hemicord at C3-4 and a nearly 2 cm minimally enhancing lesion in the right and central aspects of the cord at C7.  Lower level nonenhancing T2 hyperintensity elsewhere in the cord including in the upper thoracic spine and on the right at C2. Posterior Fossa, vertebral arteries, paraspinal tissues: Microcystic changes in the parotid glands as reported on brain MRI. Preserved vertebral artery flow voids. Disc levels: C2-3: Negative. C3-4: Negative. C4-5: Mild disc space narrowing. Mild disc bulging and minimal uncovertebral spurring without significant stenosis. C5-6: Mild disc space narrowing. Mild disc bulging and uncovertebral spurring without significant stenosis. C6-7: Moderate disc space narrowing. Disc bulging, a small left posterolateral disc protrusion, and uncovertebral spurring result in borderline to mild right and mild-to-moderate left neural foraminal stenosis without significant spinal stenosis. C7-T1: Shallow right paracentral disc protrusion without stenosis. IMPRESSION: 1. Multifocal T2 signal abnormality in the cervical spinal cord including enhancing lesions at C3-4 and C7. The appearance is nonspecific, however lupus myelitis, other inflammatory or infectious myelitis, and demyelinating disease are considerations. Tumor and ischemia are felt to be unlikely. 2. Cervical disc degeneration greatest at C6-7 where there is mild-to-moderate left neural foraminal stenosis. No significant spinal stenosis. Electronically Signed   By: Logan Bores M.D.   On: 07/31/2018 20:52   Vas [place] Lower Extremity Venous (dvt)  Result Date: 08/01/2018  Lower Venous Study Indications: Pain.  Performing Technologist: June Leap RDMS, RVT  Examination Guidelines: A complete evaluation includes B-mode imaging, spectral Doppler, color Doppler, and power Doppler as needed of all accessible portions of each vessel. Bilateral testing is considered an integral part of a complete examination. Limited examinations for reoccurring indications may be performed as noted.  Right Venous Findings:  +---+---------------+---------+-----------+----------+-------+      Compressibility Phasicity Spontaneity Properties Summary  +---+---------------+---------+-----------+----------+-------+  CFV Full            Yes       Yes                             +---+---------------+---------+-----------+----------+-------+  Left Venous Findings: +---------+---------------+---------+-----------+----------+-------+            Compressibility Phasicity Spontaneity Properties Summary  +---------+---------------+---------+-----------+----------+-------+  CFV       Full            Yes       Yes                             +---------+---------------+---------+-----------+----------+-------+  SFJ       Full                                                      +---------+---------------+---------+-----------+----------+-------+  FV Prox   Full                                                      +---------+---------------+---------+-----------+----------+-------+  FV Mid    Full                                                      +---------+---------------+---------+-----------+----------+-------+  FV Distal Full                                                      +---------+---------------+---------+-----------+----------+-------+  PFV       Full                                                      +---------+---------------+---------+-----------+----------+-------+  POP       Full            Yes       Yes                             +---------+---------------+---------+-----------+----------+-------+  PTV       Full                                                      +---------+---------------+---------+-----------+----------+-------+  PERO      Full                                                      +---------+---------------+---------+-----------+----------+-------+  Gastroc   None                                             Acute    +---------+---------------+---------+-----------+----------+-------+    Summary: Right: No  evidence of common femoral vein obstruction. Left: Findings consistent with acute deep vein thrombosis involving the left gastrocnemius vein. No cystic structure found in the popliteal fossa.  *See table(s) above for measurements and observations.    Preliminary       Assessment/Plan: Diagnosis: C3-4, C7 myelopathy/melitis with left sided  weakness and hemisensory deficits.  1. Does the need for close, 24 hr/day medical supervision in concert with the patient's rehab needs make it unreasonable for this patient to be served in a less intensive setting? Yes 2. Co-Morbidities requiring supervision/potential complications: hypothyroid, FMS, lupus 3. Due to bladder management, bowel management, safety, skin/wound care, disease management, medication administration, pain management and patient education, does the patient require 24 hr/day rehab nursing? Yes 4. Does the patient require coordinated care of a physician, rehab nurse, PT (1-2 hrs/day, 5 days/week) and OT (1-2 hrs/day, 5 days/week) to address physical and functional deficits in the context of the above medical diagnosis(es)? Yes Addressing deficits in the following areas: balance, endurance, locomotion, strength, transferring, bowel/bladder control, bathing, dressing, feeding, grooming, toileting and psychosocial support 5. Can the patient actively participate in an intensive therapy program of at least 3 hrs of therapy per day at least 5 days per week? Yes 6. The potential for patient to make measurable gains while on inpatient rehab is excellent 7. Anticipated functional outcomes upon discharge from inpatient rehab are modified independent  with PT, modified independent with OT, n/a with SLP. 8. Estimated rehab length of stay to reach the above functional goals is: 8-13 days 9. Anticipated D/C setting: Home 10. Anticipated post D/C treatments: [place] therapy 11. Overall Rehab/Functional Prognosis: excellent  RECOMMENDATIONS: This patient's  condition is appropriate for continued rehabilitative care in the following setting: CIR Patient has agreed to participate in recommended program. Yes Note that insurance prior authorization may be required for reimbursement for recommended care.  Comment: Rehab Admissions Coordinator to follow up.  Thanks,  Meredith Staggers, MD, Mellody Drown  I have personally performed a face to face diagnostic evaluation of this patient. Additionally, I have examined pertinent labs and radiographic images. I have reviewed and concur with the physician assistant's documentation above.    Lavon Paganini Angiulli, PA-C 08/02/2018        Revision History                        Routing History

## 2018-08-03 NOTE — TOC Transition Note (Signed)
Transition of Care [facility]) - CM/SW Discharge Note   Patient Details  Name: Susan Hodge MRN: [id number] Date of Birth: 02/15/1951  [facility] CM/SW Contact:  Pollie Friar, RN Phone Number: 08/03/2018, 3:18 PM   Clinical Narrative:    Pt discharging to CIR today.    Final next level of care: IP Rehab Facility Barriers to Discharge: No Barriers Identified   Patient Goals and CMS Choice        Discharge Placement                       Discharge Plan and Services                          Social Determinants of Health (SDOH) Interventions     Readmission Risk Interventions No flowsheet data found.

## 2018-08-03 NOTE — PMR Pre-admission (Signed)
PMR Admission Coordinator Pre-Admission Assessment  Patient: Susan Hodge is an 68 y.o., female MRN: [id number] DOB: [birth year] Height: 5' (152.4 cm) Weight: 57.1 kg              Insurance Information HMO:     PPO: yes     PCP:      IPA:      80/20:      OTHER:  PRIMARY: [employer]      Policy#: [id number]      Subscriber: patient CM Name: Susan Hodge      Phone#: [phone]     Fax#: [phone] Pre-Cert#: [id number] from 08/03/2018 through 08/05/2018 with updates due on 08/06/2018 to CM via fax [phone].  Case Manager to be assigned at a later date per Paula Compton (CM Supervisor)      Employer:  Benefits:  Phone #: [phone]     Name:  Eff. Date: 05/05/2018     Deduct: $0      Out of Pocket Max: $4200 (met $114)      Life Max: n/a CIR: $250/day for 6 days ($1,500 total)      SNF: $0 for first 20 days, $178/day for remaining 21-100 days Outpatient:      Co-Pay: $35 Home Health: 100%      Co-Pay:  DME: 80%     Co-Pay: 20% Providers:   SECONDARY:       Policy#:       Subscriber:  CM Name:       Phone#:      Fax#:  Pre-Cert#:       Employer:  Benefits:  Phone #:      Name:  Eff. Date:      Deduct:       Out of Pocket Max:       Life Max:  CIR:       SNF:  Outpatient:      Co-Pay:  Home Health:       Co-Pay:  DME:      Co-Pay:   Medicaid Application Date:       Case Manager:  Disability Application Date:       Case Worker:   The "Data Collection Information Summary" for patients in Inpatient Rehabilitation Facilities with attached "Privacy Act [place] Records" was provided and verbally reviewed with: Patient  Emergency Contact Information Contact Information    Name Relation Home Work Mobile   Susan Hodge Son [phone]  [phone]   Dahlia Byes [phone]  [phone]   byrd,bobby Pandora Leiter   [phone]     Current Medical History  Patient Admitting Diagnosis: C3-4, C7 myelopathy/melitis with left sided weakness and hemisensory  deficits  History of Present Illness: Susan Hodge is a 68 year old right-handed female with history of migraine headaches maintained on valproic acid,, fibromyalgia, lupus and  tobacco abuse.  Presented 07/31/2018 with recent fall 2 weeks ago landing on her buttocks, noted progressive lower extremity weakness. Denied any bowel or bladder disturbances. MRI the brain negative for acute changes however there was a chronic right MCA infarction. CT lumbar thoracic and cervical spine showed multifocal T2 signal abnormality and cervical spinal cord including enhancing lesion at C3-4 and 5 as well as C7. Cervical disc degeneration C6-7. Lumbar puncture completed WBC 14 lymphs 83,, glucose 64. Venous Doppler studies lower extremity showed acute DVT left gastrocnemius vein. Patient placed on therapeutic dose of Lovenox with plan to transition to Eliquis. Neurology consulted workup for myelitis at C3-4 and C7. Placed on  IV Solu-Medrol through 08/04/2018.    Glasgow Coma Scale Score: 15  Past Medical History  Past Medical History:  Diagnosis Date  . Chest pain   . Depression   . DJD (degenerative joint disease)   . FH: CVA (cerebrovascular accident)    father  . Fibromyalgia   . GERD (gastroesophageal reflux disease)   . Hypothyroidism   . Lupus (HCC)   . Migraines   . Palpitations   . Status post cholecystectomy    appendectomy and bilaateral oophorectomy  . Tobacco use     Family History  family history includes Stroke in her father.  Prior Rehab/Hospitalizations:  Has the patient had prior rehab or hospitalizations prior to admission? No  Has the patient had major surgery during 100 days prior to admission? No  Current Medications   Current Facility-Administered Medications:  .  acetaminophen (TYLENOL) tablet 650 mg, 650 mg, Oral, Q6H PRN, 650 mg at 08/03/18 1014 **OR** acetaminophen (TYLENOL) suppository 650 mg, 650 mg, Rectal, Q6H PRN, Rathore, Vasundhra, MD .  ALPRAZolam (XANAX)  tablet 0.5 mg, 0.5 mg, Oral, TID PRN, Elgergawy, Dawood S, MD, 0.5 mg at 08/02/18 2144 .  apixaban (ELIQUIS) tablet 10 mg, 10 mg, Oral, BID, Elgergawy, Dawood S, MD .  hydrOXYzine (ATARAX/VISTARIL) tablet 10 mg, 10 mg, Oral, Q8H PRN, Elgergawy, Dawood S, MD .  loratadine (CLARITIN) tablet 10 mg, 10 mg, Oral, Daily, Elgergawy, Dawood S, MD, 10 mg at 08/03/18 1014 .  methylPREDNISolone sodium succinate (SOLU-MEDROL) 1,000 mg in sodium chloride 0.9 % 50 mL IVPB, 1,000 mg, Intravenous, Q24H, Rathore, Vasundhra, MD, Last Rate: 58 mL/hr at 08/02/18 2154, 1,000 mg at 08/02/18 2154 .  oxyCODONE-acetaminophen (PERCOCET) 7.5-325 MG per tablet 1 tablet, 1 tablet, Oral, Q8H PRN, Elgergawy, Dawood S, MD, 1 tablet at 08/02/18 2145 .  pantoprazole (PROTONIX) EC tablet 40 mg, 40 mg, Oral, BID, Elgergawy, Dawood S, MD, 40 mg at 08/03/18 1014 .  pregabalin (LYRICA) capsule 50 mg, 50 mg, Oral, TID, Elgergawy, Dawood S, MD, 50 mg at 08/03/18 1014 .  traMADol (ULTRAM) tablet 50 mg, 50 mg, Oral, Once, Blount, Xenia T, NP .  valproic acid (DEPAKENE) 250 MG capsule 250 mg, 250 mg, Oral, BID, Elgergawy, Dawood S, MD, 250 mg at 08/03/18 1015 .  vitamin B-12 (CYANOCOBALAMIN) tablet 250 mcg, 250 mcg, Oral, Daily, Elgergawy, Dawood S, MD, 250 mcg at 08/03/18 1015 .  Vitamin D (Ergocalciferol) (DRISDOL) capsule 50,000 Units, 50,000 Units, Oral, Q Mon, Elgergawy, Dawood S, MD, 50,000 Units at 08/02/18 0857 .  vitamin E capsule 1,000 Units, 1,000 Units, Oral, Daily, Elgergawy, Dawood S, MD, 1,000 Units at 08/03/18 1012  Patients Current Diet:  Diet Order            Diet Heart Room service appropriate? Yes; Fluid consistency: Thin  Diet effective now              Precautions / Restrictions Precautions Precautions: Fall Precaution Comments: L LE numbness, weakness. Restrictions Weight Bearing Restrictions: No   Has the patient had 2 or more falls or a fall with injury in the past year?Yes, the one that led to this  admission.  Prior Activity Level Community (5-7x/wk): Was out daily, driving, until she fell 2 weeks ago.  Retired  Prior Functional Level Prior Function Level of Independence: Independent Comments: was indep until 2 weeks when she fell 2 weeks ago, now family is with her 24/7 providing assist, was driving and going to grocery store  Self Care: Did the   patient need help bathing, dressing, using the toilet or eating?  Independent  Indoor Mobility: Did the patient need assistance with walking from room to room (with or without device)? Independent  Stairs: Did the patient need assistance with internal or external stairs (with or without device)? Independent  Functional Cognition: Did the patient need help planning regular tasks such as shopping or remembering to take medications? Independent  Home Assistive Devices / Equipment Home Assistive Devices/Equipment: Cane (specify quad or straight), Eyeglasses, Shower chair with back, Walker (specify type), Wheelchair Home Equipment: Tub bench, None, Wheelchair - manual, Bedside commode  Prior Device Use: Indicate devices/aids used by the patient prior to current illness, exacerbation or injury? typically, pt does not use any DME; however, for 2 weeks immediately prior to admission pt was using a RW 2/2 lower extremity weakness.   Current Functional Level Cognition  Overall Cognitive Status: Within Functional Limits for tasks assessed Orientation Level: Oriented X4 General Comments: pt is chatty, needs redirection to task at hand but overall WFL    Extremity Assessment (includes Sensation/Coordination)  Upper Extremity Assessment: Overall WFL for tasks assessed  Lower Extremity Assessment: Defer to PT evaluation LLE Deficits / Details: grossly 1/5 with exceptions of quad at 3-/5, pt with report of diminished sensation compared to the R LLE Sensation: decreased light touch    ADLs  Overall ADL's : Needs  assistance/impaired Eating/Feeding: Set up, Sitting Grooming: Set up, Sitting Upper Body Bathing: Min guard, Sitting Lower Body Bathing: Minimal assistance, Sit to/from stand Upper Body Dressing : Set up, Min guard, Sitting Upper Body Dressing Details (indicate cue type and reason): donning new gown Lower Body Dressing: Minimal assistance, Sit to/from stand Lower Body Dressing Details (indicate cue type and reason): pt donning mesh underwear, she is able to physically lift LLE with her UEs to place into underwear, minA for standing balance; pt requires increased time to steady in standing and then is able to advance underwear over her hips using RUE, and with light minA Toilet Transfer: Minimal assistance, Ambulation Toilet Transfer Details (indicate cue type and reason): pt is able to peform mobility from chair in front of sink to recliner, hiking hips and coming up onto toes when stepping to assist with advancing LEs - increased difficulty backing up towards recliner vs going forward Toileting- Clothing Manipulation and Hygiene: Minimal assistance, Moderate assistance, Sit to/from stand Functional mobility during ADLs: Minimal assistance, Rolling walker General ADL Comments: pt with increased LE weakness, decreased standing balance; pt just completed bathing with NT assist upon arrival - assisted with completion of ADL tasks and returned to recliner end of session    Mobility  Overal bed mobility: Needs Assistance Bed Mobility: Supine to Sit Supine to sit: Min guard General bed mobility comments: increased time, used hands to assist L LE off EOB, HOB elevated    Transfers  Overall transfer level: Needs assistance Equipment used: Rolling walker (2 wheeled) Transfers: Sit to/from Stand Sit to Stand: Min guard Stand pivot transfers: Min assist General transfer comment: verbal cues to push up from bed, more steady today, have to use UEs to place L foot in proper/optimal placement to stand  due to impaired sensation and weakness    Ambulation / Gait / Stairs / Wheelchair Mobility  Ambulation/Gait Ambulation/Gait assistance: Mod assist Gait Distance (Feet): 15 Feet(x2 (to/from bathroom)) Assistive device: Rolling walker (2 wheeled) Gait Pattern/deviations: Step-to pattern, Decreased stride length, Decreased stance time - left, Decreased step length - left, Narrow base of support, Decreased   dorsiflexion - left General Gait Details: pt with no active DF, utilizes hip hike to advance LEs, with onset of fatigue pt requires minA to clear L foot and advance L LE, pt also raises up on R toes to advance L LE. Pt dependent on bilat UE to support during stance phase on the L LE Gait velocity: slow, fatiguing Gait velocity interpretation: <1.8 ft/sec, indicate of risk for recurrent falls    Posture / Balance Balance Overall balance assessment: Needs assistance Sitting-balance support: Feet supported, No upper extremity supported Sitting balance-Leahy Scale: Good Standing balance support: Bilateral upper extremity supported Standing balance-Leahy Scale: Poor Standing balance comment: dependent on UEs and RW    Special needs/care consideration BiPAP/CPAP no CPM no Continuous Drip IV: IV solumedrol 1000 mg/day to be stopped on 08/04/2018 Dialysis no        Days n/a Life Vest no Oxygen no Special Bed no Trach Size no Wound Vac (area) no      Location n/a Skin intact                              Bowel mgmt: continent, ambulates to BR with assist, last BM 08/02/2018 Bladder mgmt: continent, ambulates to BR with assist Diabetic mgmt no Behavioral consideration no Chemo/radiation no     Previous Home Environment (from acute therapy documentation) Living Arrangements: Alone Available Help at Discharge: Family, Available 24 hours/day Type of Home: House Home Layout: One level Home Access: Stairs to enter Entrance Stairs-Rails: Left Entrance Stairs-Number of Steps: 2 Bathroom  Shower/Tub: Tub/shower unit Bathroom Toilet: Standard Home Care Services: Other (Comment)(family members) Additional Comments: family providing 24/7  Discharge Living Setting Plans for Discharge Living Setting: Patient's home Type of Home at Discharge: House(trailer) Discharge Home Layout: One level Discharge Home Access: Stairs to enter Entrance Stairs-Rails: Left Entrance Stairs-Number of Steps: 2 Discharge Bathroom Shower/Tub: Tub/shower unit Discharge Bathroom Toilet: Standard Discharge Bathroom Accessibility: Yes How Accessible: Accessible via walker Does the patient have any problems obtaining your medications?: No  Social/Family/Support Systems Anticipated Caregiver: Barry and Marie (pt's son and dtr in law) Anticipated Caregiver's Contact Information: Barry [phone], and Marie [phone] Ability/Limitations of Caregiver: none, have been providing assist for 2 weeks immediately prior to admission Caregiver Availability: 24/7 Discharge Plan Discussed with Primary Caregiver: No(Pt cognitively intact w/ mod I goals.  See Add. Info below. ) Does Caregiver/Family have Issues with Lodging/Transportation while Pt is in Rehab?: No   Goals/Additional Needs Patient/Family Goal for Rehab: PT/OT mod I Expected length of stay: 8-13 days Cultural Considerations: none Dietary Needs: heart healthy Equipment Needs: tbd Special Service Needs: none Additional Information: Pt's son and daughter in law have been providing 24/7 assist for patient for 2 weeks immediately prior to admission.  Per patient they are prepared to provide assist at discharge if needed; however, pt with mod I goals.  Pt/Family Agrees to Admission and willing to participate: Yes Program Orientation Provided & Reviewed with Pt/Caregiver Including Roles  & Responsibilities: Yes    Possible need for SNF placement upon discharge: not anticipated   Patient Condition: This patient's condition remains as documented  in the consult dated 08/02/2018, in which the Rehabilitation Physician determined and documented that the patient's condition is appropriate for intensive rehabilitative care in an inpatient rehabilitation facility. Will admit to inpatient rehab today.  Preadmission Screen Completed By:  Haeley Fordham E Arlinda Barcelona, PT, 08/03/2018 2:38 PM ______________________________________________________________________   Discussed status with Dr. Swartz on   08/03/18 at 2:38 PM  and received approval for admission today.  Admission Coordinator:  Michel Santee, time 2:38 PM Sudie Grumbling 08/03/18

## 2018-08-03 NOTE — Discharge Summary (Signed)
Susan Hodge, is a 68 y.o. female  DOB 01-09-1951  MRN [id number].  Admission date:  07/31/2018  Admitting Physician  Shela Leff, MD  Discharge Date:  08/03/2018   Primary MD  [name], NP  Recommendations for primary care physician for things to follow:  -Continue with IV Solu-Medrol 1 g IV for another 2 days -Eliquis for DVT prophylaxis, currently on loading dose 10 mg p.o. twice daily for 7 days then 5 mg p.o. 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]    Principal Problem:   Myelitis ([place]) Active Problems:   Leukocytosis   Anxiety   GERD (gastroesophageal reflux disease)      Past Medical History:  Diagnosis Date   Chest pain    Depression    DJD (degenerative joint disease)    FH: CVA (cerebrovascular accident)    father   Fibromyalgia    GERD (gastroesophageal reflux disease)    Hypothyroidism    Lupus ([place])    Migraines    Palpitations    Status post cholecystectomy    appendectomy and bilaateral oophorectomy   Tobacco use     Past Surgical History:  Procedure Laterality Date   BILATERAL OOPHORECTOMY     CARDIAC CATHETERIZATION     CHOLECYSTECTOMY         History of present illness and  Hospital Course:     Kindly see H&P for history of present illness and admission details, please review complete Labs, Consult reports and Test reports for all details in brief  HPI  from the history and physical done on the day of admission 07/31/2018  HPI: Susan Hodge is a 68 y.o. female with medical history significant of GERD, hypothyroidism, migraines, fibromyalgia, herpes zoster, lupus presenting  to the hospital for evaluation of lower extremity weakness.  Patient states she had a fall over 2 weeks ago and fell on her buttocks.  She did not injure her head during the fall.  She was doing okay but then earlier this week she experienced weakness in her left lower extremity.  2 days later she could barely ambulate and had to use a wheelchair.  This morning she started experiencing weakness in her right foot.  Denies any weakness in her upper extremities.  States her buttock and groin region is feeling numb.  Her left leg feels numb above the knee.  Denies any diplopia or vision loss.  Denies any urinary or fecal incontinence.  Patient states she has no difficulty with urination or defecation.  Denies any recent fevers or chills.  Denies any cough, shortness of breath, abdominal pain, nausea, vomiting, or dysuria.  Hospital Course   Myelitis -MRI significant for  myelitis at C3-4, and C7, enhancing, with multiple areas of longitudinal signal changes suggestive of prior myelitis/demyelination, and patchy  lesions noted in T4-5, and T9-11. -Neurology input greatly appreciated, discussed with neurology, her work-up significant for myelitis, due to autoimmune process, most likely in the setting of lupus, differential diagnoses include transverse myelitis, MS, myelitis optica, VZV myelitis. -LP performed, follow onVZV PCR, aquaprion for antibodies, copper serum. -  Her HSV is negative, ACE level within normal limit, CSF stain and cultures are negative -Follow on angiotensin-converting enzyme level, and copper serum per neurology -PT/OT consulted ,  recommendation for CIR - to receive  total of 5 days of IV Solu-Medrol 1 g, IR will be able to accommodate and give Solu-Medrol tonight and tomorrow night -Continue B12 and calcium supplements  Acute left lower extremity DVT -presents  with left lower extremity pain, swelling, venous Doppler significant for acute DVT, started on Lovenox, this is most likely  provoked given her fall, left lower extremity trauma and poor mobility, initially  on Lovenox treatment dose, transitionedto Eliquis, need to be treated for total of 48-month   mild leukocytosis -Likely reactive.White count 12.6.Patient is afebrile. Denies any respiratory complaints and lungs clear on exam. Does not endorse anyUTI symptoms. Nontoxic-appearing.  -Resolved  Anxiety -Continue homeXanaxPRN.  GERD -PPI, will increase to twice daily given she is on steroids and Lovenox     Discharge Condition:  stable      Discharge Instructions  and  Discharge Medications     Discharge Instructions    Ambulatory referral to Neurology   Complete by:  As directed    An appointment is requested in approximately: 4 weeks Myelitis   Discharge instructions   Complete by:  As directed    Follow with Primary MD [name], NP  Get CBC, CMP,checked  by Primary MD next visit.    Activity: As tolerated with Full fall precautions use walker/cane & assistance as needed   Disposition CIR   Diet: Heart Healthy ** , with feeding assistance and aspiration precautions.  For Heart failure patients - Check your Weight same time everyday, if you gain over 2 pounds, or you develop in leg swelling, experience more shortness of breath or chest pain, call your Primary MD immediately. Follow Cardiac Low Salt Diet and 1.5 lit/day fluid restriction.   On your next visit with your primary care physician please Get Medicines reviewed and adjusted.   Please request your Prim.MD to go over all Hospital Tests and Procedure/Radiological results at the follow up, please get all Hospital records sent to your Prim MD by signing hospital release before you go home.   If you experience worsening of your admission symptoms, develop shortness of breath, life threatening emergency, suicidal or homicidal thoughts you must seek medical attention immediately by calling 911 or calling your MD  immediately  if symptoms less severe.  You Must read complete instructions/literature along with all the possible adverse reactions/side effects for all the Medicines you take and that have been prescribed to you. Take any new Medicines after you have completely understood and accpet all the possible adverse reactions/side effects.   Do not drive, operating heavy machinery, perform activities at heights, swimming or participation in water activities or provide baby sitting services if your were admitted for syncope or siezures until you have seen by Primary MD or a Neurologist and advised to do so again.  Do not drive when taking Pain medications.    Do not take more than prescribed Pain, Sleep and Anxiety Medications  Special Instructions: If you have smoked or chewed Tobacco  in the last  2 yrs please stop smoking, stop any regular Alcohol  and or any Recreational drug use.  Wear Seat belts while driving.   Please note  You were cared for by a hospitalist during your hospital stay. If you have any questions about your discharge medications or the care you received while you were in the hospital after you are discharged, you can call the unit and asked to speak with the hospitalist on call if the hospitalist that took care of you is not available. Once you are discharged, your primary care physician will handle any further medical issues. Please note that NO REFILLS for any discharge medications will be authorized once you are discharged, as it is imperative that you return to your primary care physician (or establish a relationship with a primary care physician if you do not have one) for your aftercare needs so that they can reassess your need for medications and monitor your lab values.   Increase activity slowly   Complete by:  As directed      Allergies as of 08/03/2018      Reactions   Amitriptyline Other (See Comments)   Bee Venom Other (See Comments)   Fexofenadine Other (See  Comments)   Furosemide Other (See Comments)   Gabapentin Other (See Comments)   Hydroxychloroquine Sulfate Other (See Comments)   Morphine Other (See Comments)   Nsaids    Oxycodone Other (See Comments)   dizzy   Topiramate Other (See Comments)      Medication List    STOP taking these medications   diclofenac 75 MG EC tablet Commonly known as:  VOLTAREN   predniSONE 10 MG (21) Tbpk tablet Commonly known as:  STERAPRED UNI-PAK 21 TAB   valACYclovir 1000 MG tablet Commonly known as:  VALTREX     TAKE these medications   ALPRAZolam 0.5 MG tablet Commonly known as:  XANAX Take 0.5 mg by mouth 3 (three) times daily as needed for anxiety or sleep.   apixaban 5 MG Tabs tablet Commonly known as:  ELIQUIS Take 10 mg oral twice daily for 7 days, then transition to 5 mg twice daily on 08/10/2018   hydrOXYzine 10 MG tablet Commonly known as:  ATARAX/VISTARIL Take 10 mg by mouth every 8 (eight) hours as needed. For itching   loratadine 10 MG tablet Commonly known as:  CLARITIN Take 10 mg by mouth daily.   methylPREDNISolone sodium succinate 1,000 mg in sodium chloride 0.9 % 50 mL Inject 1,000 mg into the vein daily for 1 day. 2 days then stop   ondansetron 4 MG tablet Commonly known as:  ZOFRAN Take 2 mg by mouth every 8 (eight) hours as needed. for nausea   oxyCODONE-acetaminophen 7.5-325 MG tablet Commonly known as:  PERCOCET Take 1 tablet by mouth every 8 (eight) hours as needed.   pantoprazole 40 MG tablet Commonly known as:  PROTONIX Take 2 tablets for 3 days, then once daily   pregabalin 50 MG capsule Commonly known as:  LYRICA Take 50 mg by mouth 3 (three) times daily.   valproic acid 250 MG capsule Commonly known as:  DEPAKENE Take 250 mg by mouth 2 (two) times daily.   VITAMIN B-12 PO Take 2 tablets by mouth daily.   Vitamin D (Ergocalciferol) 1.25 MG (50000 UT) Caps capsule Commonly known as:  DRISDOL Take 1 capsule by mouth every Monday.   vitamin E  1000 UNIT capsule Take 1,000 Units by mouth daily.  Diet and Activity recommendation: See Discharge Instructions above   Consults obtained - Neurology inaptient rehab   Major procedures and Radiology Reports - PLEASE review detailed and final reports for all details, in brief -    Mr Brain W And Wo Contrast  Result Date: 07/31/2018 CLINICAL DATA:  Myelopathy. Left leg weakness and numbness. Urinary incontinence. EXAM: MRI HEAD WITHOUT AND WITH CONTRAST TECHNIQUE: Multiplanar, multiecho pulse sequences of the brain and surrounding structures were obtained without and with intravenous contrast. CONTRAST:  5 mL Gadavist COMPARISON:  None. FINDINGS: Brain: No acute infarct, mass, midline shift, or extra-axial fluid collection is identified. There is a small to moderate-sized chronic right MCA infarct involving the frontal operculum and insula with a small amount of hemosiderin staining. Small foci of T2 hyperintensity scattered elsewhere in the cerebral white matter bilaterally, most notably in the left corona radiata, are at most mildly advanced for age. No abnormal brain parenchymal or meningeal enhancement is identified. There is mild global cerebral atrophy. Vascular: Major intracranial vascular flow voids are preserved. Skull and upper cervical spine: Unremarkable bone marrow signal. Sinuses/Orbits: Unremarkable orbits. Mild circumferential mucosal thickening and moderate volume fluid in the left maxillary sinus which appears small. Trace right mastoid effusion. Other: Diffuse nodularity/microcystic changes throughout both parotid glands. IMPRESSION: 1. No acute intracranial abnormality. 2. Chronic right MCA infarct. 3. Cerebral white matter T2 signal changes, nonspecific but compatible with mild chronic small vessel ischemic disease. 4. Diffuse nodularity/microcystic changes in the parotid gland suggestive of chronic inflammation or autoimmune disease. Electronically Signed   By: Logan Bores M.D.   On: 07/31/2018 20:13   Mr Thoracic Spine Wo Contrast  Result Date: 07/30/2018 CLINICAL DATA:  Urinary incontinence. Diminished sensation of the left leg. EXAM: MRI THORACIC SPINE WITHOUT CONTRAST TECHNIQUE: Multiplanar, multisequence MR imaging of the thoracic spine was performed. No intravenous contrast was administered. COMPARISON:  None. FINDINGS: Alignment:  Normal Vertebrae: No fracture or primary bone lesion. Cord: Multifocal abnormal cord signal with increased T2 within the central cord. This is most notable at the lower cervical region, T4 and T5, and T9-T11, but is present to a lesser degree in the intervening cord. There is no cord compression. Paraspinal and other soft tissues: Negative Disc levels: No significant degenerative disease. At T5-6 there is a shallow right posterolateral disc protrusion with its does not compress the neural structures. Minor disc bulges at T9-10 and T11-12. IMPRESSION: Abnormal T2 signal affecting the spinal cord in a patchy fashion from the lower cervical region down to the upper T11 level. Involvement appears to primarily affect the central cord, without lateralization. The differential diagnosis is not specific. This does not appear to be focal syringomyelia. Multifocal intramedullary tumor, ischemic change of the spinal cord, demyelinating disease and vitamin-B deficiency are all theoretically possible. Electronically Signed   By: Nelson Chimes M.D.   On: 07/30/2018 17:31   Mr Lumbar Spine Wo Contrast  Result Date: 07/26/2018 CLINICAL DATA:  [place] previously with low back pain and left leg pain. EXAM: MRI LUMBAR SPINE WITHOUT CONTRAST TECHNIQUE: Multiplanar, multisequence MR imaging of the lumbar spine was performed. No intravenous contrast was administered. COMPARISON:  None. FINDINGS: Segmentation:  5 lumbar type vertebral bodies. Alignment: Mild curvature convex to the left. 2 mm retrolisthesis L1-2. 2 mm anterolisthesis L3-4. Vertebrae:  No fracture  or primary bone lesion. Conus medullaris and cauda equina: Conus extends to the L1 level. Conus and cauda equina appear normal. Paraspinal and other soft tissues: Negative Disc levels:  T12-L1: Mild noncompressive disc bulge. L1-2: 2 mm retrolisthesis. Mild bulging of the disc. No compressive stenosis. L2-3: Mild bulging of the disc.  No compressive stenosis. L3-4: Bilateral facet arthropathy with some edema. 2 mm of anterolisthesis because of this. Bulging of the disc. Stenosis of the lateral recesses left more than right. Some potential for neural compression, particularly on the left. Mild left foraminal to extraforaminal encroachment by bulging disc material could focally irritate the left L3 nerve. L4-5: Endplate osteophytes and bulging of the disc. Bilateral facet and ligamentous hypertrophy. Stenosis of both lateral recesses that could possibly cause neural compression on either or both sides. The facet arthritis could also contribute to back pain. L5-S1: Mild bulging of the disc. Mild facet and ligamentous hypertrophy. Mild narrowing of the left subarticular lateral recess but without visible compression of the transitioning S1 nerve. IMPRESSION: No evidence of fracture related to a recent fall. L3-4: Bilateral facet arthropathy allowing 2 mm of anterolisthesis. Some edema of the facet joints. Bulging of the disc more towards the left. Narrowing of the lateral recesses left more than right and mild left foraminal to extraforaminal encroachment by bulging disc material. Findings at this level could certainly be associated with back pain. There be some potential for neural compression or irritation particularly in the left lateral recess and left foraminal region. L4-5: Endplate osteophytes and bulging of the disc. Facet degeneration and hypertrophy. Stenosis of both subarticular lateral recesses. Neural compression could occur on either or both sides. The facet arthritis could contribute to back pain. L5-S1:  Bulging of the disc. Mild facet hypertrophy on the left. Mild narrowing of the subarticular lateral recess but without visible compression of the transitioning left S1 nerve Electronically Signed   By: Nelson Chimes M.D.   On: 07/26/2018 18:22   Mr Cervical Spine W Or Wo Contrast  Result Date: 07/31/2018 CLINICAL DATA:  Myelopathy. Pain after a fall earlier in the month. Left leg weakness and numbness. Urinary incontinence. History of lupus. EXAM: MRI CERVICAL SPINE WITHOUT AND WITH CONTRAST TECHNIQUE: Multiplanar and multiecho pulse sequences of the cervical spine, to include the craniocervical junction and cervicothoracic junction, were obtained without and with intravenous contrast. CONTRAST:  5 mL Gadavist COMPARISON:  Thoracic spine MRI 07/30/2018 FINDINGS: Intermittent moderate motion artifact on axial sequences. Alignment: Cervical spine straightening.  No listhesis. Vertebrae: No fracture, suspicious osseous lesion, or significant marrow edema. Cord: Multifocal T2 hyperintensity in the cervical spinal cord including a 12 mm enhancing lesion in the left hemicord at C3-4 and a nearly 2 cm minimally enhancing lesion in the right and central aspects of the cord at C7. Lower level nonenhancing T2 hyperintensity elsewhere in the cord including in the upper thoracic spine and on the right at C2. Posterior Fossa, vertebral arteries, paraspinal tissues: Microcystic changes in the parotid glands as reported on brain MRI. Preserved vertebral artery flow voids. Disc levels: C2-3: Negative. C3-4: Negative. C4-5: Mild disc space narrowing. Mild disc bulging and minimal uncovertebral spurring without significant stenosis. C5-6: Mild disc space narrowing. Mild disc bulging and uncovertebral spurring without significant stenosis. C6-7: Moderate disc space narrowing. Disc bulging, a small left posterolateral disc protrusion, and uncovertebral spurring result in borderline to mild right and mild-to-moderate left neural  foraminal stenosis without significant spinal stenosis. C7-T1: Shallow right paracentral disc protrusion without stenosis. IMPRESSION: 1. Multifocal T2 signal abnormality in the cervical spinal cord including enhancing lesions at C3-4 and C7. The appearance is nonspecific, however lupus myelitis, other inflammatory or infectious myelitis,  and demyelinating disease are considerations. Tumor and ischemia are felt to be unlikely. 2. Cervical disc degeneration greatest at C6-7 where there is mild-to-moderate left neural foraminal stenosis. No significant spinal stenosis. Electronically Signed   By: Logan Bores M.D.   On: 07/31/2018 20:52   US Arterial Abi (screening Lower Extremity)  Result Date: 07/22/2018 CLINICAL DATA:  Cold left leg.  Left lower extremity pain. EXAM: NONINVASIVE PHYSIOLOGIC VASCULAR STUDY OF BILATERAL LOWER EXTREMITIES TECHNIQUE: Evaluation of both lower extremities were performed at rest, including calculation of ankle-brachial indices with single level Doppler, pressure and pulse volume recording. COMPARISON:  None. FINDINGS: Right ABI:  1.02 Left ABI:  0.96 Right Lower Extremity:  Normal arterial waveforms at the ankle. Left Lower Extremity:  Normal arterial waveforms at the ankle. 1.0-1.4 Normal IMPRESSION: Normal resting ankle-brachial indices. Electronically Signed   By: Markus Daft M.D.   On: 07/22/2018 16:49   Vas [place] Lower Extremity Venous (dvt)  Result Date: 08/02/2018  Lower Venous Study Indications: Pain.  Performing Technologist: June Leap RDMS, RVT  Examination Guidelines: A complete evaluation includes B-mode imaging, spectral Doppler, color Doppler, and power Doppler as needed of all accessible portions of each vessel. Bilateral testing is considered an integral part of a complete examination. Limited examinations for reoccurring indications may be performed as noted.  Right Venous Findings: +---+---------------+---------+-----------+----------+-------+       Compressibility Phasicity Spontaneity Properties Summary  +---+---------------+---------+-----------+----------+-------+  CFV Full            Yes       Yes                             +---+---------------+---------+-----------+----------+-------+  Left Venous Findings: +---------+---------------+---------+-----------+----------+-------+            Compressibility Phasicity Spontaneity Properties Summary  +---------+---------------+---------+-----------+----------+-------+  CFV       Full            Yes       Yes                             +---------+---------------+---------+-----------+----------+-------+  SFJ       Full                                                      +---------+---------------+---------+-----------+----------+-------+  FV Prox   Full                                                      +---------+---------------+---------+-----------+----------+-------+  FV Mid    Full                                                      +---------+---------------+---------+-----------+----------+-------+  FV Distal Full                                                      +---------+---------------+---------+-----------+----------+-------+  PFV       Full                                                      +---------+---------------+---------+-----------+----------+-------+  POP       Full            Yes       Yes                             +---------+---------------+---------+-----------+----------+-------+  PTV       Full                                                      +---------+---------------+---------+-----------+----------+-------+  PERO      Full                                                      +---------+---------------+---------+-----------+----------+-------+  Gastroc   None                                             Acute    +---------+---------------+---------+-----------+----------+-------+    Summary: Right: No evidence of common femoral vein obstruction. Left: Findings  consistent with acute deep vein thrombosis involving the left gastrocnemius vein. No cystic structure found in the popliteal fossa.  *See table(s) above for measurements and observations. Electronically signed by Ruta Hinds MD on 08/02/2018 at 8:49:58 PM.    Final     Micro Results     Recent Results (from the past 240 hour(s))  CSF culture with Stat gram stain     Status: None (Preliminary result)   Collection Time: 07/31/18 10:00 PM  Result Value Ref Range Status   Specimen Description CSF  Final   Special Requests TUBE 2  Final   Gram Stain   Final    CYTOSPIN SMEAR WBC PRESENT, PREDOMINANTLY MONONUCLEAR NO ORGANISMS SEEN    Culture   Final    NO GROWTH 3 DAYS Performed at [facility], [address] [place], [place] [id number]    Report Status PENDING  Incomplete       Today   Subjective:   Susan Hodge today has, No headache, No chest pain, No abdominal pain - No Nausea, but she is feeling her left lower extremity weakness is improving, she reports some tingling and numbness in left lower extremity today.  Objective:   Blood pressure 95/76, pulse (!) 59, temperature (!) 97.5 F (36.4 C), temperature source Oral, resp. rate 16, height 5' (1.524 m), weight 57.1 kg, SpO2 100 %.   Intake/Output Summary (Last 24 hours) at 08/03/2018 1445 Last data filed at 08/02/2018 1500 Gross per 24 hour  Intake 320 ml  Output --  Net 320 ml    Exam  Awake Alert, Oriented X 3, No new F.N deficits, Normal affect Symmetrical Chest wall  movement, Good air movement bilaterally, CTAB RRR,No Gallops,Rubs or new Murmurs, No Parasternal Heave +ve B.Sounds, Abd Soft, No tenderness, No rebound - guarding or rigidity.  Site of LP looks clean, with no blood or discharge No Cyanosis, Clubbing , left lower extremity edema has improved, No new Rash or bruise -has significant  left lower extremity weakness  Data Review   CBC w Diff:  Lab Results  Component Value Date   WBC  9.6 08/02/2018   HGB 12.9 08/02/2018   HCT 38.1 08/02/2018   PLT 203 08/02/2018   LYMPHOPCT 9 07/26/2018   MONOPCT 6 07/26/2018   EOSPCT 1 07/26/2018   BASOPCT 0 07/26/2018    CMP:  Lab Results  Component Value Date   NA 138 08/02/2018   K 4.2 08/02/2018   CL 102 08/02/2018   CO2 26 08/02/2018   BUN 27 (H) 08/02/2018   CREATININE 0.91 08/02/2018   PROT 7.0 07/31/2018   ALBUMIN 3.7 07/31/2018   BILITOT 0.3 07/31/2018   ALKPHOS 68 07/31/2018   AST 23 07/31/2018   ALT 25 07/31/2018  .   Total Time in preparing paper work, data evaluation and todays exam - 80 minutes  Phillips Climes M.D on 08/03/2018 at 2:45 PM  [facility]   Office  [phone]

## 2018-08-03 NOTE — Progress Notes (Signed)
Occupational Therapy Treatment Patient Details Name: Susan Hodge MRN: [id number] DOB: February 20, 1951 Today's Date: 08/03/2018    History of present illness  Susan Hodge is a 68 y.o. female with medical history significant of GERD, hypothyroidism, migraines, fibromyalgia, herpes zoster, lupus presenting to the hospital for evaluation of lower extremity weakness.  Patient states she had a fall over 2 weeks ago and fell on her buttocks. MRI revealed areas of myelitis at C3-4 and C7 and changes at T2-11.   OT comments  Pt presents supine in bed pleasant and willing to participate in therapy session. Pt requiring minA for stand pivot transfers using RW, continues to require at least minA for LB ADL due to LLE weakness and instability. Pt performing seated grooming ADL with setup assist. Pt remains motivated to return to PLOF, performing LLE exercises and using UEs, RLE to assist with facilitating LLE movement as needed during functional tasks. Feel she remains an appropriate candidate for CIR level services at time of discharge. Will continue to follow acutely.   Follow Up Recommendations  CIR;Supervision/Assistance - 24 hour    Equipment Recommendations  Other (comment)(defer to next venue)          Precautions / Restrictions Precautions Precautions: Fall Precaution Comments: L LE numbness, weakness. Restrictions Weight Bearing Restrictions: No       Mobility Bed Mobility Overal bed mobility: Needs Assistance Bed Mobility: Supine to Sit     Supine to sit: Min guard     General bed mobility comments: increased time, used hands to assist L LE off EOB, HOB elevated  Transfers Overall transfer level: Needs assistance Equipment used: Rolling walker (2 wheeled) Transfers: Sit to/from [facility] Sit to Stand: Min guard Stand pivot transfers: Min assist       General transfer comment: verbal cues to push up from bed, more steady today, have to use UEs to  place L foot in proper/optimal placement to stand due to impaired sensation and weakness; steadying assist to take pivotal steps to recliner, pt continues to have difficulty advancing LLE    Balance Overall balance assessment: Needs assistance Sitting-balance support: Feet supported;No upper extremity supported Sitting balance-Leahy Scale: Good     Standing balance support: Bilateral upper extremity supported Standing balance-Leahy Scale: Poor Standing balance comment: dependent on UEs and RW                           ADL either performed or assessed with clinical judgement   ADL Overall ADL's : Needs assistance/impaired     Grooming: Wash/dry face;Oral care;Set up;Sitting               Lower Body Dressing: Minimal assistance;Sit to/from stand Lower Body Dressing Details (indicate cue type and reason): for sock management, increased effort to reach LLE though pt is able to use UEs to bring LLE towards her             Functional mobility during ADLs: Minimal assistance;Rolling walker       Vision       Perception     Praxis      Cognition Arousal/Alertness: Awake/alert Behavior During Therapy: WFL for tasks assessed/performed Overall Cognitive Status: Within Functional Limits for tasks assessed  Exercises Exercises: Other exercises Other Exercises Other Exercises: provided AAROM/stretching to L ankle for flexion Other Exercises: pt self demonstrating use of RLE and UEs to assist with ranging LLE including with knee flexion and abduction/adduction   Shoulder Instructions       General Comments NT present to assess vitals during session, VSS    Pertinent Vitals/ Pain       Pain Assessment: Faces Faces Pain Scale: Hurts a little bit Pain Location: headache Pain Descriptors / Indicators: Headache Pain Intervention(s): Monitored during session;Premedicated before session  Home Living                                           Prior Functioning/Environment              Frequency  Min 3X/week        Progress Toward Goals  OT Goals(current goals can now be found in the care plan section)  Progress towards OT goals: Progressing toward goals  Acute Rehab OT Goals Patient Stated Goal: get back to indep OT Goal Formulation: With patient Time For Goal Achievement: 08/16/18 Potential to Achieve Goals: Good  Plan Discharge plan remains appropriate    Co-evaluation                 AM-PAC OT "6 Clicks" Daily Activity     Outcome Measure   Help from another person eating meals?: None Help from another person taking care of personal grooming?: A Little Help from another person toileting, which includes using toliet, bedpan, or urinal?: A Lot Help from another person bathing (including washing, rinsing, drying)?: A Lot Help from another person to put on and taking off regular upper body clothing?: None Help from another person to put on and taking off regular lower body clothing?: A Lot 6 Click Score: 17    End of Session Equipment Utilized During Treatment: Gait belt;Rolling walker  OT Visit Diagnosis: Unsteadiness on feet (R26.81);Other abnormalities of gait and mobility (R26.89);Muscle weakness (generalized) (M62.81)   Activity Tolerance Patient tolerated treatment well   Patient Left in chair;with call bell/phone within reach;with chair alarm set   Nurse Communication Mobility status        Time: 1025-8527 OT Time Calculation (min): 25 min  Charges: OT General Charges $OT Visit: 1 Visit OT Treatments $Self Care/Home Management : 23-37 mins  Lou Cal, OT Supplemental Rehabilitation Services Pager [phone] Office [phone]   Raymondo Band 08/03/2018, 3:39 PM

## 2018-08-03 NOTE — Progress Notes (Signed)
Iv consult placed, attempted x 1,patient refused any other attermpts, report given to 4w-05 RN.

## 2018-08-03 NOTE — Progress Notes (Signed)
Called by nursing staff for 2nd opinion of radiating leg pain and numbness of the right leg. Ms. Rane appears anxious and c/o 10/10 pain that began in her leg and radiated up her body.  She has received something for pain. Pt has good strength dorsiflexion and plantar flexion of the right foot and can lift her right leg off the bed. She denies pain with homans sign.  She does c/o numbness in her right leg but can detect sensation. Will discuss with RN and call if further assistance is needed.  Temp 98.5 F, HR 69 reg, BP 93/64 (71), RR 19 with 100% sats on RA

## 2018-08-03 NOTE — Progress Notes (Addendum)
ANTICOAGULATION CONSULT NOTE - follow up   Pharmacy Consult for Lovenox  Transition to Eliquis Indication: DVT, left lower leg  Allergies  Allergen Reactions  . Amitriptyline Other (See Comments)  . Bee Venom Other (See Comments)  . Fexofenadine Other (See Comments)  . Furosemide Other (See Comments)  . Gabapentin Other (See Comments)  . Hydroxychloroquine Sulfate Other (See Comments)  . Morphine Other (See Comments)  . Nsaids   . Oxycodone Other (See Comments)    dizzy  . Topiramate Other (See Comments)    Patient Measurements: Height: 5' (152.4 cm) Weight: 125 lb 14.1 oz (57.1 kg) IBW/kg (Calculated) : 45.5 Lovenox Dosing Weight: 57 kg  Vital Signs: Temp: 98.2 F (36.8 C) (03/31 0724) Temp Source: Oral (03/31 0724) BP: 92/46 (03/31 0724) Pulse Rate: 65 (03/31 0724)  Labs: Recent Labs    07/31/18 1704 08/01/18 0242 08/02/18 0725  HGB 12.4 11.9* 12.9  HCT Susan.0 34.7* Susan.1  PLT 200 189 203  CREATININE 0.97  --  0.91    Estimated Creatinine Clearance: 47.4 mL/min (by C-G formula based on SCr of 0.91 mg/dL).   Medical History: Past Medical History:  Diagnosis Date  . Chest pain   . Depression   . DJD (degenerative joint disease)   . FH: CVA (cerebrovascular accident)    father  . Fibromyalgia   . GERD (gastroesophageal reflux disease)   . Hypothyroidism   . Lupus ([place])   . Migraines   . Palpitations   . Status post cholecystectomy    appendectomy and bilaateral oophorectomy  . Tobacco use    Assessment: 68 yr old Susan Hodge on Lovenox for LLE DVT. Planning to transition to apixaban.  Goal of Therapy:  Monitor platelets by anticoagulation protocol: Yes   Plan:  D/c enoxaparin Start apixaban 10mg  BID for 1 week then  5mg  BID after that. Give first dose 0 to 2 hours before the time that the next dose of enoxaparin is due (2200) Monitor H&H and for s/sx of bleeding  Thanks,  Lavena Bullion, PharmD Candidate 08/03/2018,11:33 AM  I discussed /  reviewed the pharmacy note by Lavena Bullion, PharmD Candidate and I agree with the resident's findings and plans as documented.  Nicole Cella, RPh Clinical Pharmacist [phone] Please check AMION for all [facility] phone numbers After 10:00 PM, call [place] [phone] 08/03/2018 12:15 PM

## 2018-08-04 ENCOUNTER — Inpatient Hospital Stay (HOSPITAL_COMMUNITY): Payer: Medicare HMO

## 2018-08-04 ENCOUNTER — Inpatient Hospital Stay (HOSPITAL_COMMUNITY): Payer: Medicare HMO | Admitting: Physical Therapy

## 2018-08-04 ENCOUNTER — Inpatient Hospital Stay (HOSPITAL_COMMUNITY): Payer: Medicare HMO | Admitting: Occupational Therapy

## 2018-08-04 DIAGNOSIS — R799 Abnormal finding of blood chemistry, unspecified: Secondary | ICD-10-CM

## 2018-08-04 DIAGNOSIS — E46 Unspecified protein-calorie malnutrition: Secondary | ICD-10-CM

## 2018-08-04 DIAGNOSIS — D62 Acute posthemorrhagic anemia: Secondary | ICD-10-CM

## 2018-08-04 DIAGNOSIS — G8918 Other acute postprocedural pain: Secondary | ICD-10-CM

## 2018-08-04 DIAGNOSIS — G894 Chronic pain syndrome: Secondary | ICD-10-CM

## 2018-08-04 DIAGNOSIS — E8809 Other disorders of plasma-protein metabolism, not elsewhere classified: Secondary | ICD-10-CM

## 2018-08-04 LAB — CSF CULTURE W GRAM STAIN: Culture: NO GROWTH

## 2018-08-04 LAB — CBC WITH DIFFERENTIAL/PLATELET
Abs Immature Granulocytes: 0.06 10*3/uL (ref 0.00–0.07)
Basophils Absolute: 0 10*3/uL (ref 0.0–0.1)
Basophils Relative: 0 %
Eosinophils Absolute: 0 10*3/uL (ref 0.0–0.5)
Eosinophils Relative: 0 %
HCT: 34.9 % — ABNORMAL LOW (ref 36.0–46.0)
Hemoglobin: 11.9 g/dL — ABNORMAL LOW (ref 12.0–15.0)
Immature Granulocytes: 1 %
Lymphocytes Relative: 11 %
Lymphs Abs: 0.8 10*3/uL (ref 0.7–4.0)
MCH: 31.6 pg (ref 26.0–34.0)
MCHC: 34.1 g/dL (ref 30.0–36.0)
MCV: 92.8 fL (ref 80.0–100.0)
Monocytes Absolute: 0.1 10*3/uL (ref 0.1–1.0)
Monocytes Relative: 2 %
Neutro Abs: 6.4 10*3/uL (ref 1.7–7.7)
Neutrophils Relative %: 86 %
Platelets: 185 10*3/uL (ref 150–400)
RBC: 3.76 MIL/uL — ABNORMAL LOW (ref 3.87–5.11)
RDW: 12.8 % (ref 11.5–15.5)
WBC: 7.4 10*3/uL (ref 4.0–10.5)
nRBC: 0 % (ref 0.0–0.2)

## 2018-08-04 LAB — COMPREHENSIVE METABOLIC PANEL
ALT: 19 U/L (ref 0–44)
AST: 18 U/L (ref 15–41)
Albumin: 2.6 g/dL — ABNORMAL LOW (ref 3.5–5.0)
Alkaline Phosphatase: 42 U/L (ref 38–126)
Anion gap: 10 (ref 5–15)
BUN: 31 mg/dL — ABNORMAL HIGH (ref 8–23)
CO2: 27 mmol/L (ref 22–32)
Calcium: 8.8 mg/dL — ABNORMAL LOW (ref 8.9–10.3)
Chloride: 100 mmol/L (ref 98–111)
Creatinine, Ser: 0.9 mg/dL (ref 0.44–1.00)
GFR calc Af Amer: >60 mL/min (ref >60)
GFR calc non Af Amer: >60 mL/min (ref >60)
Glucose, Bld: 124 mg/dL — ABNORMAL HIGH (ref 70–99)
Potassium: 4.1 mmol/L (ref 3.5–5.1)
Sodium: 137 mmol/L (ref 135–145)
Total Bilirubin: 0.4 mg/dL (ref 0.3–1.2)
Total Protein: 5.2 g/dL — ABNORMAL LOW (ref 6.5–8.1)

## 2018-08-04 LAB — CSF CULTURE

## 2018-08-04 LAB — MISC LABCORP TEST (SEND OUT): Labcorp test code: 4210

## 2018-08-04 MED ORDER — PRO-STAT SUGAR FREE PO LIQD
30.0000 mL | Freq: Two times a day (BID) | ORAL | Status: DC
  Administered 2018-08-04 – 2018-08-17 (×8): 30 mL via ORAL
  Filled 2018-08-04 (×22): qty 30

## 2018-08-04 NOTE — Progress Notes (Signed)
Physical Therapy Session Note  Patient Details  Name: Susan Hodge MRN: [id number] Date of Birth: 1950/08/18  Today's Date: 08/04/2018 PT Individual Time: 1015-1100 and 1530-1615  PT Individual Time Calculation (min): 45 min and 30 min  Short Term Goals: Week 1:  PT Short Term Goal 1 (Week 1): Pt will perform bed<>chair transfers with no more than mod assist PT Short Term Goal 2 (Week 1): Pt will ambulate at least 5ft using LRAD with no more than mod assist PT Short Term Goal 3 (Week 1): Pt will ascend/descend 4 steps using handrails with no more than mod assist  PT Short Term Goal 4 (Week 1): Pt will perform supine<>sit with no more than min assist  Skilled Therapeutic Interventions/Progress Updates:    Session 1: Pt seated in w/c upon PT arrival, agreeable to therapy tx and reports back pain 7/10 and reports mild L LE soreness. Pt transported to the gym. Pt performed squat pivot to w/c with min assist and cues for techniques. Pt performed sit<>stands x 8 and x 5 this session from mat without AD and mod assist for LE strengthening with cues for L knee control and symmetric LE weightbearing. Pt performed x 4 mini squats in standing without AD, therapist providing manual facilitation for L knee control, mirror for visual feedback, mod assist. Pt worked on L LE stance control and weightbearing while performing pre-gait stepping in place with R LE, x 5 steps and x 8 steps with mod assist and L UE around therapists shoulder. Pt performed squat pivot back to w/c with mod assist, transported back to room and left in w/c with needs in reach and chair alarm set.   Session 2: Pt seated in w/c upon PT arrival, agreeable to therapy tx and reports pain 6/10 in low back and soreness in L knee. Pt transported to the gym. Pt performed squat pivot to the mat with min assist. Pt performed sit<>stands x6 from mat throughout session with min-mod assist, no AD. In standing without AD pt worked on lateral  weightshifting in place with cues for techniques, therapist facilitating L knee control to limit hyperextension and block knee. Pt worked on static standing balance this session without UE support, min-mod assist with cues for midline/symmetric LE weightbearing, therapist blocking L knee. Pt worked on dynamic standing balance without UE support to reach/toss horseshoes, x 2 trials with min-mod assist for standing balance and therapist controlling L knee. Therapist attempted to trial AFO today however smaller size not available. Therapist applied DF ace wrap for foot clearance, pt ambulated x 10 ft with RW and mod assist, L LE hip circumduction noted, therapist attempting to prevent/limit this. Pt transported back to room and performed x 2 squat pivot transfers w/c<>recliner in each direction with min assist, cues for techniques. Therapist recommending squat pivot transfers for all w/c<>bed transfers secondary to L LE buckling and to limit compensatory patterns in standing, reported this to NT and RN. Pt left in recliner with needs in reach and chair alarm set.    Therapy Documentation Precautions:  Restrictions Weight Bearing Restrictions: No   Therapy/Group: Individual Therapy  Netta Corrigan, PT, DPT 08/04/2018, 8:01 AM

## 2018-08-04 NOTE — Patient Care Conference (Signed)
Inpatient RehabilitationTeam Conference and Plan of Care Update Date: 08/04/2018   Time: 11:15 AM    Patient Name: Susan Hodge      Medical Record Number: [id number]  Date of Birth: 02-13-1951 Sex: Female         Room/Bed: 4W05C/4W05C-01 Payor Info: Payor: [employer] MEDICARE / Plan: [place] MEDICARE HMO/PPO / Product Type: *No Product type* /    Admitting Diagnosis: myelitis  Admit Date/Time:  08/03/2018  4:43 PM Admission Comments: No comment available   Primary Diagnosis:  <principal problem not specified> Principal Problem: <principal problem not specified>  Patient Active Problem List   Diagnosis Date Noted  . Acute blood loss anemia   . Hypoalbuminemia due to protein-calorie malnutrition ([place])   . Elevated BUN   . Postoperative pain   . Chronic pain syndrome   . Myelitis ([place]) 07/31/2018  . Leukocytosis 07/31/2018  . Anxiety 07/31/2018  . GERD (gastroesophageal reflux disease) 07/31/2018  . ANXIETY DISORDER, GENERALIZED 07/23/2009  . UNSPECIFIED HYPOTHYROIDISM 06/21/2009  . NONDEPENDENT TOBACCO USE DISORDER 06/21/2009  . DEPRESSIVE DISORDER NOT ELSEWHERE CLASSIFIED 06/21/2009  . INTERMEDIATE CORONARY SYNDROME 06/21/2009  . LUPUS NEPHRITIS 06/21/2009  . UNSPECIFIED MYALGIA AND MYOSITIS 06/21/2009  . PRECORDIAL PAIN 06/21/2009    Expected Discharge Date:    Team Members Present: Physician leading conference: Dr. Delice Lesch Social Worker Present: Ovidio Kin, LCSW Nurse Present: Ellison Carwin, LPN PT Present: Michaelene Song, PT;Other (comment)(Carley Pippin-PT) OT Present: Clyda Greener, OT SLP Present: Stormy Fabian, SLP PPS Coordinator present : Ileana Ladd, PT     Current Status/Progress Goal Weekly Team Focus  Medical   Functional and cognitive deficits secondary to left basal ganglia infarct  Improve mobility, anxiety, chronic pain  See above   Bowel/Bladder        Cont B & B     Swallow/Nutrition/ Hydration             ADL's     eval pending         Mobility   mod assist bed mobility, mod/max assist transfers, max assist ambulation 17ft using RW  supervision bed mobility, CGA transfers, and min assist ambulating with LRAD  L LE neuromuscular re-education, transfers, pre-gait training   Communication             Safety/Cognition/ Behavioral Observations            Pain        less than 3     Skin        no skin issues        *See Care Plan and progress notes for long and short-term goals.     Barriers to Discharge  Current Status/Progress Possible Resolutions Date Resolved   Physician    Medical stability     See above  Therapies, optimize anxiety meds, coping mechanisms      Nursing                  PT  Inaccessible home environment;Home environment access/layout                 OT                  SLP                SW                Discharge Planning/Teaching Needs:    Home with family members coming in to assist her  if needed. First day in rehab-working on endurance and strength     Team Discussion:  New eval setting goals for pt. Endurance is poor will need rest breaks in therapies. Await evaluations. MD addressing meds and checking labs  Revisions to Treatment Plan:  New eval    Continued Need for Acute Rehabilitation Level of Care: The patient requires daily medical management by a physician with specialized training in physical medicine and rehabilitation for the following conditions: Daily direction of a multidisciplinary physical rehabilitation program to ensure safe treatment while eliciting the highest outcome that is of practical value to the patient.: Yes Daily medical management of patient stability for increased activity during participation in an intensive rehabilitation regime.: Yes Daily analysis of laboratory values and/or radiology reports with any subsequent need for medication adjustment of medical intervention for : Neurological problems;Mood/behavior problems;Other   I attest that I  was present, lead the team conference, and concur with the assessment and plan of the team. Teleconference held due to COVID-19   [name] 08/04/2018, 2:03 PM

## 2018-08-04 NOTE — Care Management Note (Signed)
[facility] Individual Statement of Services  Patient Name:  Susan Hodge  Date:  08/04/2018  Welcome to the [facility].  Our goal is to provide you with an individualized program based on your diagnosis and situation, designed to meet your specific needs.  With this comprehensive rehabilitation program, you will be expected to participate in at least 3 hours of rehabilitation therapies Monday-Friday, with modified therapy programming on the weekends.  Your rehabilitation program will include the following services:  Physical Therapy (PT), Occupational Therapy (OT), 24 hour per day rehabilitation nursing, Case Management (Social Worker), Rehabilitation Medicine, Nutrition Services and Pharmacy Services  Weekly team conferences will be held on Wednesday to discuss your progress.  Your Social Worker will talk with you frequently to get your input and to update you on team discussions.  Team conferences with you and your family in attendance may also be held.  Expected length of stay: 2-3 weeks  Overall anticipated outcome: supervision-min assist level  Depending on your progress and recovery, your program may change. Your Social Worker will coordinate services and will keep you informed of any changes. Your Social Worker's name and contact numbers are listed  below.  The following services may also be recommended but are not provided by the [facility] will be made to provide these services after discharge if needed.  Arrangements include referral to agencies that provide these services.  Your insurance has been verified to be:  [employer] Your primary doctor is:  [name]  Pertinent information will be shared with your doctor and your insurance company.  Social Worker:  Ovidio Kin, [place] or [phone]  Information discussed with and copy given to patient by: [name], 08/04/2018, 11:51 AM

## 2018-08-04 NOTE — Progress Notes (Signed)
Social Work  Social Work Assessment and Plan  Patient Details  Name: Susan Hodge MRN: [id number] Date of Birth: 07/28/1950  Today's Date: 08/04/2018  Problem List:  Patient Active Problem List   Diagnosis Date Noted  . Myelitis ([place]) 07/31/2018  . Leukocytosis 07/31/2018  . Anxiety 07/31/2018  . GERD (gastroesophageal reflux disease) 07/31/2018  . ANXIETY DISORDER, GENERALIZED 07/23/2009  . UNSPECIFIED HYPOTHYROIDISM 06/21/2009  . NONDEPENDENT TOBACCO USE DISORDER 06/21/2009  . DEPRESSIVE DISORDER NOT ELSEWHERE CLASSIFIED 06/21/2009  . INTERMEDIATE CORONARY SYNDROME 06/21/2009  . LUPUS NEPHRITIS 06/21/2009  . UNSPECIFIED MYALGIA AND MYOSITIS 06/21/2009  . PRECORDIAL PAIN 06/21/2009   Past Medical History:  Past Medical History:  Diagnosis Date  . Chest pain   . Depression   . DJD (degenerative joint disease)   . FH: CVA (cerebrovascular accident)    father  . Fibromyalgia   . GERD (gastroesophageal reflux disease)   . Hypothyroidism   . Lupus ([place])   . Migraines   . Palpitations   . Status post cholecystectomy    appendectomy and bilaateral oophorectomy  . Tobacco use    Past Surgical History:  Past Surgical History:  Procedure Laterality Date  . BILATERAL OOPHORECTOMY    . CARDIAC CATHETERIZATION    . CHOLECYSTECTOMY     Social History:  reports that she has been smoking. She has never used smokeless tobacco. She reports that she does not drink alcohol or use drugs.  Family / Support Systems Marital Status: Single Patient Roles: Parent, Other (Comment)(sibling) Children: Susan Hodge [phone]  Susan Hodge [phone] Other Supports: Siblings and freinds Anticipated Caregiver: Susan Hodge and Susan Hodge Ability/Limitations of Caregiver: none-family can provide 24 hr care if needed Caregiver Availability: 24/7 Family Dynamics: Close knit family all live around each other. Son and his wife were providing care prior to admission for the last 2 weeks. She has  extended family and freinds who are supportive.  Social History Preferred language: English Religion: Baptist Cultural Background: No issues Education: [employer] Read: Yes Write: Yes Employment Status: Retired Public relations account executive Issues: No issues Guardian/Conservator: None-according to MD pt is capable of making her own decisions while here.    Abuse/Neglect Abuse/Neglect Assessment Can Be Completed: Yes Physical Abuse: Denies Verbal Abuse: Denies Sexual Abuse: Denies Exploitation of patient/patient's resources: Denies Self-Neglect: Denies  Emotional Status Pt's affect, behavior and adjustment status: Pt is motivated to get stronger and more mobile while here. She has had multiple health issues in the last two weeks. She has had multiple tests to rule out many things. She will manage and feels whatever level she is at she and family will provide what she needs. Family comes to her if needs care. Recent Psychosocial Issues: multiple medical issues-many chronic issues Psychiatric History: History of depression takes meds feel they are helpful and will continue to take them. Is coping appropriately and has a optimistic attitude. Do not feel at this time will need neuro-psych services, will monitor while here Substance Abuse History: Tobacco may continue to smoke aware of the recommendations to quit for her health  Patient / Family Perceptions, Expectations & Goals Pt/Family understanding of illness & functional limitations: Pt is able to explain her health issues and the numerous tests she has had while here. She feels everything has been ruled out and is working on regaining her strength and endurance. Talks with MD daily and feels heard. Premorbid pt/family roles/activities: Mom, grandmother, sibling, retiree, home owner, etc Anticipated changes in roles/activities/participation: resume Pt/family expectations/goals: Pt states: "  I want to get back to takng care of myself,  like I did before my fall."  Son states: " We will assist if needed and have been."  [employer]: None Premorbid Home Care/DME Agencies: Other (Comment)(has all DME could need) Transportation available at discharge: Family and siblings Resource referrals recommended: Support group (specify)  Discharge Planning Living Arrangements: Alone Support Systems: Children, Other relatives, Friends/neighbors, Social worker community Type of Residence: Private residence Insurance Resources: Multimedia programmer (specify)([employer] Medicare) Financial Resources: Social Security Living Expenses: Own Money Management: Patient Does the patient have any problems obtaining your medications?: No Home Management: Self or family members Patient/Family Preliminary Plans: Return to her home with family members assisting her if needed. Like they were prior to admission. She feels she will do well here and regain her strength and endurance while here. Will await teams' evaluations and work on discharge needs. Social Work Anticipated Follow Up Needs: HH/OP, Support Group  Clinical Impression Motivated female who has had numerous health issues prior to admission. Multiple tests were done to rule out diagnoses. She does have a DVT and myelitis being treated for. She has strong support with multiple family members who have offered to assist at discharge. Will await evaluations and work on discharge needs.  Susan Hodge 08/04/2018, 11:49 AM

## 2018-08-04 NOTE — Discharge Instructions (Addendum)
Inpatient Rehab Discharge Instructions  Susan Hodge Discharge date and time: No discharge date for patient encounter.   Activities/Precautions/ Functional Status: Activity: activity as tolerated Diet: regular diet Wound Care: none needed Functional status:  ___ No restrictions     ___ Walk up steps independently ___ 24/7 supervision/assistance   ___ Walk up steps with assistance ___ Intermittent supervision/assistance  ___ Bathe/dress independently ___ Walk with walker     _x__ Bathe/dress with assistance ___ Walk Independently    ___ Shower independently ___ Walk with assistance    ___ Shower with assistance ___ No alcohol     ___ Return to work/school ________  Special Instructions: No driving or smoking    COMMUNITY REFERRALS UPON DISCHARGE:    Home Hodge:   PT, OT, Susan Hodge   Date of last service:08/18/2018  Medical Equipment/Items Ordered:WHEELCHAIR, Vassie Moselle, 3 IN 1 & TUB BENCH  Agency/Supplier:Susan Hodge   [phone]   GENERAL COMMUNITY RESOURCES FOR PATIENT/FAMILY: Support Groups:[facility] SUPPORT GROUP THE SECOND Thursday OF EACH MONTH ( SEPT-MAY) @ 6:00-7:00 PM ON THE REHAB [place] Susan Hodge [phone]  My questions have been answered and I understand these instructions. I will adhere to these goals and the provided educational materials after my discharge from the hospital.  Patient/Caregiver Signature _______________________________ Date __________  Clinician Signature _______________________________________ Date __________  Please bring this form and your medication list with you to all your follow-up doctor's appointments.   Information on my medicine - ELIQUIS (apixaban)  This medication education was reviewed with me or my healthcare representative as part of my discharge preparation.   Why was Eliquis prescribed for you? Eliquis was prescribed to treat blood clots that may have been  found in the veins of your legs (deep vein thrombosis) or in your lungs (pulmonary embolism) and to reduce the risk of them occurring again.  What do You need to know about Eliquis ? The starting dose is 10 mg (two 5 mg tablets) taken TWICE daily for the FIRST SEVEN (7) DAYS, then on 08/10/18  the dose is reduced to ONE 5 mg tablet taken TWICE daily.  Eliquis may be taken with or without food.   Try to take the dose about the same time in the morning and in the evening. If you have difficulty swallowing the tablet whole please discuss with your pharmacist how to take the medication safely.  Take Eliquis exactly as prescribed and DO NOT stop taking Eliquis without talking to the doctor who prescribed the medication.  Stopping may increase your risk of developing a new blood clot.  Refill your prescription before you run out.  After discharge, you should have regular check-up appointments with your healthcare provider that is prescribing your Eliquis.    What do you do if you miss a dose? If a dose of ELIQUIS is not taken at the scheduled time, take it as soon as possible on the same day and twice-daily administration should be resumed. The dose should not be doubled to make up for a missed dose.  Important Safety Information A possible side effect of Eliquis is bleeding. You should call your healthcare provider right away if you experience any of the following: ? Bleeding from an injury or your nose that does not stop. ? Unusual colored urine (red or dark brown) or unusual colored stools (red or black). ? Unusual bruising for unknown reasons. ? A serious fall or if you hit your head (even if  there is no bleeding).  Some medicines may interact with Eliquis and might increase your risk of bleeding or clotting while on Eliquis. To help avoid this, consult your healthcare provider or pharmacist prior to using any new prescription or non-prescription medications, including herbals, vitamins,  non-steroidal anti-inflammatory drugs (NSAIDs) and supplements.  This website has more information on Eliquis (apixaban): http://[email or web address].com/eliquis/home

## 2018-08-04 NOTE — Progress Notes (Signed)
[facility] PHYSICAL MEDICINE & REHABILITATION PROGRESS NOTE  Subjective/Complaints: Patient seen laying in bed this morning.  She states she slept well overnight.  She states she is ready begin therapies.  Severe anxiety reported by nursing, requiring evaluation by rapid response overnight.  ROS: Denies CP, shortness of breath, nausea, vomiting, diarrhea.  Objective: Vital Signs: Blood pressure (!) 105/51, pulse 60, temperature 97.9 F (36.6 C), resp. rate 17, height 5' (1.524 m), SpO2 100 %. No results found. Recent Labs    08/02/18 0725 08/04/18 0613  WBC 9.6 7.4  HGB 12.9 11.9*  HCT 38.1 34.9*  PLT 203 185   Recent Labs    08/02/18 0725 08/04/18 0613  NA 138 137  K 4.2 4.1  CL 102 100  CO2 26 27  GLUCOSE 133* 124*  BUN 27* 31*  CREATININE 0.91 0.90  CALCIUM 9.3 8.8*    Physical Exam: BP (!) 105/51 (BP Location: Left Arm)   Pulse 60   Temp 97.9 F (36.6 C)   Resp 17   Ht 5' (1.524 m)   SpO2 100%   BMI 24.58 kg/m  Constitutional: No distress . Vital signs reviewed. HENT: Normocephalic.  Atraumatic. Eyes: EOMI. No discharge. Cardiovascular: RRR. No JVD. Respiratory: CTA Bilaterally. Normal effort. GI: BS +. Non-distended. Musc: No edema or tenderness in extremities. Neurological: She isalertand oriented Motor: Bilateral upper extremities: 5/5 proximal distal Right lower extremity: 4-4+/5 proximal distal Left lower extremity: Hip flexion, knee extension 2+/5, ankle dorsiflexion 1/5 Sensation diminished to light touch LLE > RLE. Skin: Warm and dry.  Intact. Psychiatric:Anxious  Assessment/Plan: 1. Functional deficits secondary to C3-4, C7 myelitis which require 3+ hours per day of interdisciplinary therapy in a comprehensive inpatient rehab setting.  Physiatrist is providing close team supervision and 24 hour management of active medical problems listed below.  Physiatrist and rehab team continue to assess barriers to discharge/monitor patient  progress toward functional and medical goals  Care Tool:  Bathing              Bathing assist       Upper Body Dressing/Undressing Upper body dressing        Upper body assist      Lower Body Dressing/Undressing Lower body dressing      What is the patient wearing?: Hospital gown only     Lower body assist Assist for lower body dressing: Contact Guard/Touching assist     Toileting Toileting    Toileting assist Assist for toileting: Moderate Assistance - Patient 50 - 74%     Transfers Chair/bed transfer  Transfers assist     Chair/bed transfer assist level: Moderate Assistance - Patient 50 - 74%     Locomotion Ambulation   Ambulation assist              Walk 10 feet activity   Assist           Walk 50 feet activity   Assist           Walk 150 feet activity   Assist           Walk 10 feet on uneven surface  activity   Assist           Wheelchair     Assist               Wheelchair 50 feet with 2 turns activity    Assist            Wheelchair 150 feet activity  Assist            Medical Problem List and Plan: 1.Left side weakness and hemisensory deficitssecondary to C3-4, C7 myelitis  IV Solu-Medrolto be completed 08/04/2018.  Begin CIR  Notes reviewed-myelitis, labs reviewed 2. Antithrombotics: DVT/anticoagulation:Acute DVT left gastrocnemius vein.Transitioned from Lovenox to Eliquis Antiplatelet therapy: N/A 3. Pain Management/fibromyalgia/migraine headaches:Valproic acid 250 mg twice a day Lyrica 50 mg 3 times a day,oxycodone as needed  Will need encouragement and reassurance 4. Mood:Xanax 0.5 mg 3 times a day as needed  Severe anxiety at present, will consider Lexapro pending response to therapies today  Will require reassurance -antipsychotic agents: N/A 5. Neuropsych: This patientiscapable of making decisions on herown behalf. 6.  Skin/Wound Care:Routine skin checks 7. Fluids/Electrolytes/Nutrition:Routine in and out's   BUN trending up  Encourage fluids 8. GERD. Protonix 9. Tobacco abuse. Counseling 10.  Hypoalbuminemia  Supplement initiated on 4/1 11.  Acute blood loss anemia  Hemoglobin 11.9 on 4/29  Continue to monitor   LOS: 1 days A FACE TO FACE EVALUATION WAS PERFORMED  Aaradhya Kysar Lorie Phenix 08/04/2018, 12:40 PM

## 2018-08-04 NOTE — Progress Notes (Signed)
Patient rang out stating "I can't breathe." Patients lung sounds are clear and unlabored, pulse ox is 100% and skin is warm and dry. Patient is anxious. Redirected her and advised her to calm down. Patient had been sleeping prior to this.

## 2018-08-04 NOTE — Progress Notes (Signed)
Orthopedic Tech Progress Note Patient Details:  Susan Hodge June 21, 1950 [id number] Called in brace order Patient ID: Susan Hodge, female   DOB: 1950/08/30, 68 y.o.   MRN: [id number]   Susan Hodge 08/04/2018, 4:37 PM

## 2018-08-04 NOTE — Plan of Care (Signed)
Due to the current state of emergency, patients may not be receiving their 3-hours of Medicare-mandated therapy.   

## 2018-08-04 NOTE — Plan of Care (Signed)
  Problem: RH KNOWLEDGE DEFICIT SCI Goal: RH STG INCREASE KNOWLEDGE OF SELF CARE AFTER SCI Outcome: Not Progressing   Problem: Education: Goal: Ability to state activities that reduce stress will improve 08/04/2018 0338 by Lazaro Arms, RN Outcome: Not Progressing 08/04/2018 0337 by Lazaro Arms, RN Outcome: Not Progressing   Problem: Coping: Goal: Ability to identify and develop effective coping behavior will improve 08/04/2018 0338 by Lazaro Arms, RN Outcome: Not Progressing 08/04/2018 0337 by Lazaro Arms, RN Outcome: Not Progressing   Problem: Self-Concept: Goal: Ability to identify factors that promote anxiety will improve 08/04/2018 0338 by Lazaro Arms, RN Outcome: Not Progressing 08/04/2018 0337 by Lazaro Arms, RN Outcome: Not Progressing Goal: Level of anxiety will decrease 08/04/2018 0338 by Lazaro Arms, RN Outcome: Not Progressing 08/04/2018 0337 by Lazaro Arms, RN Outcome: Not Progressing Goal: Ability to modify response to factors that promote anxiety will improve 08/04/2018 0338 by Lazaro Arms, RN Outcome: Not Progressing 08/04/2018 0337 by Lazaro Arms, RN Outcome: Not Progressing

## 2018-08-04 NOTE — Progress Notes (Signed)
Patient information reviewed and entered into e[facility] System by Becky Alaya Iverson, PPS coordinator. Information including medical coding, function ability, and quality indicators will be reviewed and updated through discharge.   

## 2018-08-04 NOTE — Evaluation (Signed)
Physical Therapy Assessment and Plan  Patient Details  Name: Susan Hodge MRN: [id number] Date of Birth: Apr 07, 1951  PT Diagnosis: Abnormality of gait, Difficulty walking, Hypotonia, Impaired sensation, Muscle weakness and Pain in R LE Rehab Potential: Good ELOS: 2.5 - 3 weeks   Today's Date: 08/04/2018 PT Individual Time: 3846-6599 PT Individual Time Calculation (min): 77 min    Problem List:  Patient Active Problem List   Diagnosis Date Noted  . Myelitis ([place]) 07/31/2018  . Leukocytosis 07/31/2018  . Anxiety 07/31/2018  . GERD (gastroesophageal reflux disease) 07/31/2018  . ANXIETY DISORDER, GENERALIZED 07/23/2009  . UNSPECIFIED HYPOTHYROIDISM 06/21/2009  . NONDEPENDENT TOBACCO USE DISORDER 06/21/2009  . DEPRESSIVE DISORDER NOT ELSEWHERE CLASSIFIED 06/21/2009  . INTERMEDIATE CORONARY SYNDROME 06/21/2009  . LUPUS NEPHRITIS 06/21/2009  . UNSPECIFIED MYALGIA AND MYOSITIS 06/21/2009  . PRECORDIAL PAIN 06/21/2009    Past Medical History:  Past Medical History:  Diagnosis Date  . Chest pain   . Depression   . DJD (degenerative joint disease)   . FH: CVA (cerebrovascular accident)    father  . Fibromyalgia   . GERD (gastroesophageal reflux disease)   . Hypothyroidism   . Lupus ([place])   . Migraines   . Palpitations   . Status post cholecystectomy    appendectomy and bilaateral oophorectomy  . Tobacco use    Past Surgical History:  Past Surgical History:  Procedure Laterality Date  . BILATERAL OOPHORECTOMY    . CARDIAC CATHETERIZATION    . CHOLECYSTECTOMY      Assessment & Plan Clinical Impression: Patient is a 68 y.o. year old female with history of migraine headaches maintained on valproic acid,, fibromyalgia, lupus and tobacco abuse. She lives alone one level home with 2 steps to entry. Independent prior to admission a good support of local family. Presented 07/31/2018 with recent fall 2 weeks ago landing on her buttocks noted progressive lower extremity  weakness. Denied any bowel or bladder disturbances. MRI the brain negative for acute changes however there was a chronic right MCA infarction. CT lumbar thoracic and cervical spine showed multifocal T2 signal abnormality and cervical spinal cord including enhancing lesion at C3-4 and 5 as well as C7. Cervical disc degeneration C6-7. Lumbar puncture completed WBC 14 lymphs 83,, glucose 64. Venous Doppler studies lower extremity showed acute DVT left gastrocnemius vein. Patient placed on therapeutic dose of Lovenox with plan to transition to Eliquis 08/03/2018. Neurology consulted workup for myelitis at C3-4 and C7. Placed on IV Solu-Medrol through 08/04/2018 and stop Therapy evaluations completed with recommendations of physical medicine rehabilitation consult.  Patient transferred to CIR on 08/03/2018 .   Patient currently requires max with mobility secondary to muscle weakness, decreased cardiorespiratoy endurance, impaired timing and sequencing, abnormal tone, unbalanced muscle activation and decreased motor planning and decreased sitting balance, decreased standing balance, decreased postural control and decreased balance strategies.  Prior to hospitalization, patient was independent  with mobility and lived with Alone in a House home.  Home access is 2Stairs to enter.  Patient will benefit from skilled PT intervention to maximize safe functional mobility, minimize fall risk and decrease caregiver burden for planned discharge home with 24 hour assist.  Anticipate patient will benefit from follow up [facility] at discharge.  PT - End of Session Activity Tolerance: Tolerates 30+ min activity with multiple rests Endurance Deficit: Yes PT Assessment Rehab Potential (ACUTE/IP ONLY): Good PT Barriers to Discharge: Inaccessible home environment;Home environment access/layout PT Patient demonstrates impairments in the following area(s): Balance;Pain;Endurance;Sensory;Motor PT Transfers Functional  Problem(s): Bed  Mobility;Bed to Chair;Car;Furniture PT Locomotion Functional Problem(s): Ambulation;Wheelchair Mobility;Stairs PT Plan PT Intensity: Minimum of 1-2 x/day ,45 to 90 minutes PT Frequency: 5 out of 7 days PT Duration Estimated Length of Stay: 2.5 - 3 weeks PT Treatment/Interventions: Ambulation/gait training;Discharge planning;DME/adaptive equipment instruction;Functional mobility training;Pain management;Psychosocial support;Splinting/orthotics;Therapeutic Activities;UE/LE Strength taining/ROM;Balance/vestibular training;Community reintegration;Disease management/prevention;Functional electrical stimulation;Neuromuscular re-education;Patient/family education;Skin care/wound management;Stair training;Therapeutic Exercise;UE/LE Coordination activities;Wheelchair propulsion/positioning PT Transfers Anticipated Outcome(s): CGA for bed<>chair transfers PT Locomotion Anticipated Outcome(s): Min assist ambulation with LRAD PT Recommendation Recommendations for Other Services: Neuropsych consult;Therapeutic Recreation consult Therapeutic Recreation Interventions: Pet therapy;Stress management;Kitchen group;Outing/community reintergration Follow Up Recommendations: Home health PT Patient destination: Home Equipment Recommended: To be determined Equipment Details: pt has RW, w/c, BSC, cane  Skilled Therapeutic Intervention Evaluation completed (see details above and below) with education on PT POC and goals and individual treatment initiated with focus on education regarding therapy POC, rehab goals, discharge planning, and functional mobility including bed mobility, bed<>chair transfers, car transfers, gait and stair navigation. Pt received supine in bed and motivated to participate with therapy. Pt Performed supine to sit, flat bed, with mod assist for L LE management and trunk upright. Therapist assisted with LB dressing sitting on EOB progressed to performing sit<>stand with mod to max assist for lifting  and balance with assist for clothing management. Pt performed stand pivot transfer bed to w/c with B UE support on therapist and mod to max assist for lifting/lowering and balance. Pt transported in w/co to/from therapy gym for time management and energy conservation. Pt performed stand pivot car transfer, UE support on car and therapist, with mod/max assist for lifting, balance and L LE management during transfer with pt education on proper sequencing of transfer for safety. Pt ambulated ~56f using RW with mod/max assist for balance and L LE management - pt demonstrated ability to initiate L LE swing phase via hip flexion but no knee or ankle control and demonstrated L LE knee hyperextension or buckling during stance phase requiring blocking. Pt ascended/descended 2, 3" steps, with B UE support on L handrail with side step-to pattern with cuing for sequencing and therapist assisting with L LE placement on step and max sasist for blocking L knee buckling and balance. Performed sit<>stand from w/c to RW with mod/max assist for lifting and balance with pt performing standing weight shifting onto L LE focusing on weightbearing and co-contraction of muscular for neuromuscular re-education with therapist blocking knee to prevent buckling or hyperextension x 2 bouts. Pt returned to room and left seated in w/c with needs in reach and seat belt alarm on.   PT Evaluation Precautions/Restrictions Precautions Precautions: Fall Precaution Comments: L LE numbness, weakness Restrictions Weight Bearing Restrictions: No Pain Pain Assessment Pain Scale: 0-10 Pain Score: 8 (reports "this is normal for me") Pain Type: Acute pain;Chronic pain Pain Location: Leg Pain Orientation: Right Pain Descriptors / Indicators: Numbness Pain Frequency: Constant Pain Onset: Gradual Patients Stated Pain Goal: 6(reports lowest pain is 6-8 on "normal day") Pain Intervention(s): Other (Comment)(pt reports she requested nursing wait  until after therapy for medication administration ) Multiple Pain Sites: Yes 2nd Pain Site Pain Score: 5 Pain Type: Chronic pain Pain Location: Hand Pain Orientation: Left Home Living/Prior Functioning Home Living Available Help at Discharge: Family;Available 24 hours/day(sons, DIL, friend, or grandchildren - family planning on taking shifts) Type of Home: House Home Access: Stairs to enter [employer] Steps: 2 Entrance Stairs-Rails: Left Home Layout: One level  Lives With: Alone Prior Function Level  of Independence: Independent with basic ADLs;Other (comment);Independent with transfers;Independent with homemaking with ambulation(reports she was gardening and raking leaves - completely independent - community ambulator for necessities)  Able to Take Stairs?: Yes Driving: Yes Vocation: Retired Leisure: Hobbies-yes (Comment) Comments: gardening, spending time with your family/friends; has RW, w/c, BSC, shower chair Vision/Perception  Baseline Vision/History: wears glasses Wears Glasses: at all time (reports they help with her migraines) Patient visual report: no change from baseline Perception Perception: Within Functional Limits Praxis Praxis: Intact  Cognition Overall Cognitive Status: Within Functional Limits for tasks assessed Orientation Level: Oriented X4 Attention: Focused;Sustained Focused Attention: Appears intact Sustained Attention: Appears intact Memory: Appears intact Awareness: Appears intact Safety/Judgment: Appears intact Sensation Sensation Light Touch: Impaired Detail Peripheral sensation comments: Impaired L LE light touch throughout extremity; impaired R LE light touch sensation on plantar surface of foot otherwise intact Light Touch Impaired Details: Impaired RLE;Absent LLE Proprioception: Impaired Detail Proprioception Impaired Details: Impaired LLE Coordination Gross Motor Movements are Fluid and Coordinated: No Coordination and  Movement Description: Impaired L LE motor coordination due to paresis and sensory impairments Motor  Motor Motor: Other (comment);Abnormal tone Motor - Skilled Clinical Observations: L LE paresis and hypotonia  Mobility Bed Mobility Bed Mobility: Rolling Right;Rolling Left;Supine to Sit Rolling Right: Moderate Assistance - Patient 50-74% Rolling Left: Moderate Assistance - Patient 50-74% Supine to Sit: Moderate Assistance - Patient 50-74% Transfers Transfers: Sit to Stand;Stand to Sit;Stand Pivot Transfers Sit to Stand: Maximal Assistance - Patient 25-49% Stand to Sit: Maximal Assistance - Patient 25-49% Stand Pivot Transfers: Maximal Assistance - Patient 25 - 49% Stand Pivot Transfer Details: Tactile cues for sequencing;Tactile cues for weight shifting;Tactile cues for posture;Verbal cues for precautions/safety;Verbal cues for sequencing;Visual cues/gestures for sequencing;Verbal cues for technique;Manual facilitation for placement Transfer (Assistive device): 1 person hand held assist Locomotion  Gait Ambulation: Yes Gait Assistance: Maximal Assistance - Patient 25-49% Gait Distance (Feet): 10 Feet Assistive device: Rolling walker Gait Assistance Details: Verbal cues for sequencing;Verbal cues for technique;Verbal cues for gait pattern;Manual facilitation for placement;Manual facilitation for weight shifting Gait Gait: Yes Gait Pattern: Step-to pattern;Decreased step length - right;Decreased step length - left;Decreased stance time - left;Decreased hip/knee flexion - left;Decreased dorsiflexion - left;Decreased weight shift to left;Left hip hike;Narrow base of support Gait velocity: decreased Stairs / Additional Locomotion Stairs: Yes Stairs Assistance: Maximal Assistance - Patient 25 - 49% Stair Management Technique: One rail Right(with B UE suport) Number of Stairs: 2 Height of Stairs: 3 Wheelchair Mobility Wheelchair Mobility: No  Trunk/Postural Assessment  Cervical  Assessment Cervical Assessment: Within Functional Limits Thoracic Assessment Thoracic Assessment: Within Functional Limits Lumbar Assessment Lumbar Assessment: Exceptions to WFL(posterior pelvic tilt) Postural Control Postural Control: Deficits on evaluation Postural Limitations: impaired  Balance Balance Balance Assessed: Yes Static Sitting Balance Static Sitting - Balance Support: Feet supported Static Sitting - Level of Assistance: 5: Stand by assistance Dynamic Sitting Balance Dynamic Sitting - Balance Support: Feet supported;During functional activity Dynamic Sitting - Level of Assistance: 4: Min assist Static Standing Balance Static Standing - Balance Support: During functional activity;No upper extremity supported Static Standing - Level of Assistance: 2: Max assist Dynamic Standing Balance Dynamic Standing - Balance Support: During functional activity;No upper extremity supported Dynamic Standing - Level of Assistance: 2: Max assist Extremity Assessment      RLE Assessment RLE Assessment: Exceptions to [facility] RLE Strength Right Hip Flexion: 4/5 Right Hip ABduction: 4/5 Right Hip ADduction: 4/5 Right Knee Flexion: 4/5 Right Knee Extension: 4/5 Right Ankle Dorsiflexion: 4/5 Right  Ankle Plantar Flexion: 4/5 LLE Assessment LLE Assessment: Exceptions to [facility] LLE Strength Left Hip Flexion: 2/5 Left Hip ABduction: 2+/5 Left Hip ADduction: 2/5 Left Knee Flexion: 2-/5 Left Knee Extension: 2/5 Left Ankle Dorsiflexion: 0/5 Left Ankle Plantar Flexion: 2+/5    Refer to Care Plan for Long Term Goals  Recommendations for other services: Neuropsych and Therapeutic Recreation  Pet therapy, Kitchen group, Stress management and Outing/community reintegration  Discharge Criteria: Patient will be discharged from PT if patient refuses treatment 3 consecutive times without medical reason, if treatment goals not met, if there is a change in medical status, if patient makes no  progress towards goals or if patient is discharged from hospital.  The above assessment, treatment plan, treatment alternatives and goals were discussed and mutually agreed upon: by patient  Tawana Scale, PT, DPT 08/04/2018, 8:59 AM

## 2018-08-04 NOTE — Plan of Care (Signed)
Patient stated chronic pain being managed with medications and did not require prn medications.

## 2018-08-04 NOTE — Plan of Care (Signed)
  Problem: RH SAFETY Goal: RH STG ADHERE TO SAFETY PRECAUTIONS W/ASSISTANCE/DEVICE Description STG Adhere to Safety Precautions With Assistance/Device. Outcome: Progressing   Problem: RH PAIN MANAGEMENT Goal: RH STG PAIN MANAGED AT OR BELOW PT'S PAIN GOAL Outcome: Progressing   Problem: RH KNOWLEDGE DEFICIT SCI Goal: RH STG INCREASE KNOWLEDGE OF SELF CARE AFTER SCI Outcome: Not Progressing

## 2018-08-04 NOTE — Evaluation (Signed)
Occupational Therapy Assessment and Plan  Patient Details  Name: Susan Hodge MRN: [id number] Date of Birth: November 13, 1950  OT Diagnosis: abnormal posture, acute pain and muscle weakness (generalized) Rehab Potential: Rehab Potential (ACUTE ONLY): Excellent ELOS: 7-10 days   Today's Date: 08/04/2018 OT Individual Time: 2500-3704 OT Individual Time Calculation (min): 62 min     Problem List:  Patient Active Problem List   Diagnosis Date Noted  . Acute blood loss anemia   . Hypoalbuminemia due to protein-calorie malnutrition ([place])   . Elevated BUN   . Postoperative pain   . Chronic pain syndrome   . Myelitis ([place]) 07/31/2018  . Leukocytosis 07/31/2018  . Anxiety 07/31/2018  . GERD (gastroesophageal reflux disease) 07/31/2018  . ANXIETY DISORDER, GENERALIZED 07/23/2009  . UNSPECIFIED HYPOTHYROIDISM 06/21/2009  . NONDEPENDENT TOBACCO USE DISORDER 06/21/2009  . DEPRESSIVE DISORDER NOT ELSEWHERE CLASSIFIED 06/21/2009  . INTERMEDIATE CORONARY SYNDROME 06/21/2009  . LUPUS NEPHRITIS 06/21/2009  . UNSPECIFIED MYALGIA AND MYOSITIS 06/21/2009  . PRECORDIAL PAIN 06/21/2009    Past Medical History:  Past Medical History:  Diagnosis Date  . Chest pain   . Depression   . DJD (degenerative joint disease)   . FH: CVA (cerebrovascular accident)    father  . Fibromyalgia   . GERD (gastroesophageal reflux disease)   . Hypothyroidism   . Lupus ([place])   . Migraines   . Palpitations   . Status post cholecystectomy    appendectomy and bilaateral oophorectomy  . Tobacco use    Past Surgical History:  Past Surgical History:  Procedure Laterality Date  . BILATERAL OOPHORECTOMY    . CARDIAC CATHETERIZATION    . CHOLECYSTECTOMY      Assessment & Plan Clinical Impression: Patient is a 68 y.o. year old female with recent admission to the hospital on 07/31/2018 with recent fall 2 weeks ago landing on her buttocks noted progressive lower extremity weakness. Denied any bowel or bladder  disturbances. MRI the brain negative for acute changes however there was a chronic right MCA infarction. CT lumbar thoracic and cervical spine showed multifocal T2 signal abnormality and cervical spinal cord including enhancing lesion at C3-4 and 5 as well as C7. Cervical disc degeneration C6-7. Lumbar puncture completed WBC 14 lymphs 83,, glucose 64. Venous Doppler studies lower extremity showed acute DVT left gastrocnemius vein..  Patient transferred to CIR on 08/03/2018 .    Patient currently requires mod with basic self-care skills secondary to muscle weakness, unbalanced muscle activation and decreased standing balance and decreased balance strategies.  Prior to hospitalization, patient could complete ADLs with independent .  Patient will benefit from skilled intervention to decrease level of assist with basic self-care skills and increase independence with basic self-care skills prior to discharge home with care partner.  Anticipate patient will require 24 hour supervision and follow up home health.  OT - End of Session Activity Tolerance: Tolerates 30+ min activity with multiple rests Endurance Deficit: Yes OT Assessment Rehab Potential (ACUTE ONLY): Excellent OT Patient demonstrates impairments in the following area(s): Balance;Motor;Endurance;Safety;Cognition;Pain OT Basic ADL's Functional Problem(s): Grooming;Bathing;Dressing;Toileting OT Advanced ADL's Functional Problem(s): Simple Meal Preparation;Full Meal Preparation OT Transfers Functional Problem(s): Tub/Shower;Toilet OT Additional Impairment(s): None OT Plan OT Intensity: Minimum of 1-2 x/day, 45 to 90 minutes OT Frequency: 5 out of 7 days OT Duration/Estimated Length of Stay: 7-10 days OT Treatment/Interventions: Balance/vestibular training;Discharge planning;Community reintegration;Patient/family education;Self Care/advanced ADL retraining;Therapeutic Exercise;UE/LE Coordination activities;DME/adaptive equipment  instruction;Therapeutic Activities;UE/LE Strength taining/ROM;Pain management;Functional mobility training OT Self Feeding Anticipated Outcome(s): independent OT  Basic Self-Care Anticipated Outcome(s): supervision OT Toileting Anticipated Outcome(s): supervision OT Bathroom Transfers Anticipated Outcome(s): supervision OT Recommendation Patient destination: Home Follow Up Recommendations: Home health OT Equipment Recommended: To be determined Equipment Details: pt has small shower seat, may need tub bench, will update as needed.   Skilled Therapeutic Intervention Pt completed selfcare retraining sit to stand at the sink this session per her request.  She completed all UB selfcare with supervision.  Mod assist for LB bathing and dressing sit to stand secondary to LLE weakness.  Noted left knee buckling in standing when attempting to shift her weight to the LLE.  She completed stand pivot toilet transfer to the 3:1 with max assist for toileting.  She also demonstrates decreased left knee activation as well.  Pt pleasant and cooperative.  Discussed anticipation of supervision for discharge goals, which pt reports she will have. Pt left in the wheelchair with call button and phone in reach with alarm belt in place.     OT Evaluation Precautions/Restrictions  Precautions Precautions: Fall Precaution Comments: L LE numbness, weakness Restrictions Weight Bearing Restrictions: No  Pain Pain Assessment Pain Scale: Faces Faces Pain Scale: Hurts a little bit Pain Type: Acute pain Pain Location: Leg Pain Orientation: Left Pain Descriptors / Indicators: Tightness Pain Onset: With Activity Pain Intervention(s): Repositioned Home Living/Prior [place] expects to be discharged to:: Private residence Living Arrangements: Alone Available Help at Discharge: Family, Available 24 hours/day Type of Home: House Home Access: Stairs to enter Technical brewer of  Steps: 2 Entrance Stairs-Rails: Left Home Layout: One level Bathroom Shower/Tub: Chiropodist: Standard  Lives With: Alone IADL History Homemaking Responsibilities: Yes Meal Prep Responsibility: Primary Laundry Responsibility: Primary Cleaning Responsibility: Primary Bill Paying/Finance Responsibility: Primary Shopping Responsibility: Primary Current License: Yes Mode of Transportation: Car Prior Function Level of Independence: Independent with basic ADLs, Other (comment), Independent with transfers, Independent with homemaking with ambulation(reports she was gardening and raking leaves - completely independent - community ambulator for necessities)  Able to Take Stairs?: Yes Driving: Yes Vocation: Retired Leisure: Hobbies-yes (Comment) Comments: gardening, spending time with your family/friends; has RW, w/c, BSC, shower chair ADL ADL Eating: Independent Where Assessed-Eating: Wheelchair Grooming: Setup Where Assessed-Grooming: Sitting at sink, Wheelchair Upper Body Bathing: Setup Where Assessed-Upper Body Bathing: Wheelchair, Sitting at sink Lower Body Bathing: Minimal assistance Where Assessed-Lower Body Bathing: Wheelchair, Sitting at sink, Standing at sink Upper Body Dressing: Supervision/safety Where Assessed-Upper Body Dressing: Sitting at sink Lower Body Dressing: Moderate assistance Where Assessed-Lower Body Dressing: Wheelchair Toileting: Moderate assistance Where Assessed-Toileting: Bedside Commode Toilet Transfer: Maximal assistance Toilet Transfer Method: Stand pivot Toilet Transfer Equipment: Bedside commode Vision Baseline Vision/History: Wears glasses Wears Glasses: At all times Patient Visual Report: No change from baseline Vision Assessment?: No apparent visual deficits Perception  Perception: Within Functional Limits Praxis Praxis: Intact Cognition Overall Cognitive Status: Within Functional Limits for tasks  assessed Arousal/Alertness: Awake/alert Orientation Level: Person;Place;Situation Person: Oriented Place: Oriented Situation: Oriented Year: 2020 Month: March Day of Week: Correct Memory: Appears intact Immediate Memory Recall: Sock;Blue;Bed Memory Recall: Sock;Blue;Bed Memory Recall Sock: Without Cue Memory Recall Blue: Without Cue Memory Recall Bed: Without Cue Attention: Focused;Sustained Focused Attention: Appears intact Sustained Attention: Appears intact Awareness: Appears intact Problem Solving: Appears intact Safety/Judgment: Appears intact Comments: Pt is hyperverbal at times, but no cognitive deficits. Sensation Sensation Light Touch: Appears Intact Peripheral sensation comments: Impaired L LE light touch throughout extremity; impaired R LE light touch sensation on plantar surface of foot otherwise  intact Light Touch Impaired Details: Impaired RLE;Absent LLE Hot/Cold: Appears Intact Proprioception: Appears Intact Proprioception Impaired Details: Impaired LLE Stereognosis: Appears Intact Additional Comments: sensation intact in BUEs Coordination Gross Motor Movements are Fluid and Coordinated: No Fine Motor Movements are Fluid and Coordinated: Yes Coordination and Movement Description: Impaired L LE motor coordination due to paresis and sensory impairments Motor  Motor Motor: Other (comment);Abnormal tone Motor - Skilled Clinical Observations: L LE paresis and hypotonia Mobility  Bed Mobility Bed Mobility: Rolling Right;Rolling Left;Supine to Sit Rolling Right: Moderate Assistance - Patient 50-74% Rolling Left: Moderate Assistance - Patient 50-74% Supine to Sit: Moderate Assistance - Patient 50-74% Transfers Sit to Stand: Moderate Assistance - Patient 50-74% Stand to Sit: Moderate Assistance - Patient 50-74%  Trunk/Postural Assessment  Cervical Assessment Cervical Assessment: Within Functional Limits Thoracic Assessment Thoracic Assessment: Within  Functional Limits Lumbar Assessment Lumbar Assessment: Within Functional Limits Postural Control Postural Control: Deficits on evaluation Postural Limitations: impaired  Balance Balance Balance Assessed: Yes Static Sitting Balance Static Sitting - Balance Support: Feet supported Static Sitting - Level of Assistance: 5: Stand by assistance Dynamic Sitting Balance Dynamic Sitting - Balance Support: During functional activity Dynamic Sitting - Level of Assistance: 5: Stand by assistance Static Standing Balance Static Standing - Balance Support: During functional activity Static Standing - Level of Assistance: 3: Mod assist Dynamic Standing Balance Dynamic Standing - Balance Support: During functional activity Dynamic Standing - Level of Assistance: 2: Max assist Extremity/Trunk Assessment RUE Assessment RUE Assessment: Within Functional Limits LUE Assessment LUE Assessment: Within Functional Limits     Refer to Care Plan for Long Term Goals  Recommendations for other services: None    Discharge Criteria: Patient will be discharged from OT if patient refuses treatment 3 consecutive times without medical reason, if treatment goals not met, if there is a change in medical status, if patient makes no progress towards goals or if patient is discharged from hospital.  The above assessment, treatment plan, treatment alternatives and goals were discussed and mutually agreed upon: by patient  Anuhea Gassner OTR/L 08/04/2018, 4:06 PM

## 2018-08-05 ENCOUNTER — Inpatient Hospital Stay (HOSPITAL_COMMUNITY): Payer: Medicare HMO | Admitting: Occupational Therapy

## 2018-08-05 ENCOUNTER — Inpatient Hospital Stay (HOSPITAL_COMMUNITY): Payer: Medicare HMO | Admitting: Physical Therapy

## 2018-08-05 ENCOUNTER — Inpatient Hospital Stay (HOSPITAL_COMMUNITY): Payer: Medicare HMO

## 2018-08-05 DIAGNOSIS — K592 Neurogenic bowel, not elsewhere classified: Secondary | ICD-10-CM

## 2018-08-05 DIAGNOSIS — F411 Generalized anxiety disorder: Secondary | ICD-10-CM

## 2018-08-05 DIAGNOSIS — N319 Neuromuscular dysfunction of bladder, unspecified: Secondary | ICD-10-CM

## 2018-08-05 NOTE — Plan of Care (Signed)
  Problem: Consults Goal: RH SPINAL CORD INJURY PATIENT EDUCATION Description  See Patient Education module for education specifics.  Outcome: Progressing   Problem: SCI BOWEL ELIMINATION Goal: RH STG MANAGE BOWEL WITH ASSISTANCE Description STG Manage Bowel with Assistance. Outcome: Progressing Goal: RH STG SCI MANAGE BOWEL WITH MEDICATION WITH ASSISTANCE Description STG SCI Manage bowel with medication with assistance. Outcome: Progressing   Problem: RH SAFETY Goal: RH STG ADHERE TO SAFETY PRECAUTIONS W/ASSISTANCE/DEVICE Description STG Adhere to Safety Precautions With Assistance/Device. Outcome: Progressing   Problem: RH PAIN MANAGEMENT Goal: RH STG PAIN MANAGED AT OR BELOW PT'S PAIN GOAL Outcome: Progressing   Problem: RH KNOWLEDGE DEFICIT SCI Goal: RH STG INCREASE KNOWLEDGE OF SELF CARE AFTER SCI Outcome: Progressing   Problem: Education: Goal: Ability to state activities that reduce stress will improve Outcome: Progressing   Problem: Coping: Goal: Ability to identify and develop effective coping behavior will improve Outcome: Progressing   Problem: Self-Concept: Goal: Ability to identify factors that promote anxiety will improve Outcome: Progressing Goal: Level of anxiety will decrease Outcome: Progressing Goal: Ability to modify response to factors that promote anxiety will improve Outcome: Progressing

## 2018-08-05 NOTE — Progress Notes (Signed)
[facility] PHYSICAL MEDICINE & REHABILITATION PROGRESS NOTE  Subjective/Complaints: Patient seen laying in bed this morning.  She states she slept better overnight.  She states she was able to tolerate the steroids much better.  She states she had a good first day of therapies yesterday.  ROS: Denies CP, shortness of breath, nausea, vomiting, diarrhea.  Objective: Vital Signs: Blood pressure (!) 95/52, pulse 64, temperature 98 F (36.7 C), temperature source Oral, resp. rate 19, height 5' (1.524 m), SpO2 97 %. No results found. Recent Labs    08/04/18 0613  WBC 7.4  HGB 11.9*  HCT 34.9*  PLT 185   Recent Labs    08/04/18 0613  NA 137  K 4.1  CL 100  CO2 27  GLUCOSE 124*  BUN 31*  CREATININE 0.90  CALCIUM 8.8*    Physical Exam: BP (!) 95/52 (BP Location: Left Arm)   Pulse 64   Temp 98 F (36.7 C) (Oral)   Resp 19   Ht 5' (1.524 m)   SpO2 97%   BMI 24.58 kg/m  Constitutional: No distress . Vital signs reviewed. HENT: Normocephalic.  Atraumatic. Eyes: EOMI. No discharge. Cardiovascular: RRR.  No JVD. Respiratory: CTA bilaterally.  Normal effort. GI: BS +. Non-distended. Musc: No edema or tenderness in extremities. Neurological: She isalertand oriented Motor: Bilateral upper extremities: 5/5 proximal distal Right lower extremity: 4-4+/5 proximal distal, stable Left lower extremity: Hip flexion, knee extension 2+/5, ankle dorsiflexion 1/5, stable Sensation diminished to light touch LLE > RLE. Skin: Warm and dry.  Intact. Psychiatric: Normal mood.  Normal affect.  Assessment/Plan: 1. Functional deficits secondary to C3-4, C7 myelitis which require 3+ hours per day of interdisciplinary therapy in a comprehensive inpatient rehab setting.  Physiatrist is providing close team supervision and 24 hour management of active medical problems listed below.  Physiatrist and rehab team continue to assess barriers to discharge/monitor patient progress toward functional  and medical goals  Care Tool:  Bathing    Body parts bathed by patient: Right arm, Chest, Left arm, Right upper leg, Left upper leg, Right lower leg, Abdomen, Front perineal area, Left lower leg, Face, Buttocks         Bathing assist Assist Level: Moderate Assistance - Patient 50 - 74%     Upper Body Dressing/Undressing Upper body dressing   What is the patient wearing?: Pull over shirt, Bra    Upper body assist Assist Level: Set up assist    Lower Body Dressing/Undressing Lower body dressing      What is the patient wearing?: Pants, Incontinence brief     Lower body assist Assist for lower body dressing: Moderate Assistance - Patient 50 - 74%     Toileting Toileting    Toileting assist Assist for toileting: Moderate Assistance - Patient 50 - 74%     Transfers Chair/bed transfer  Transfers assist     Chair/bed transfer assist level: Moderate Assistance - Patient 50 - 74%     Locomotion Ambulation   Ambulation assist      Assist level: Maximal Assistance - Patient 25 - 49% Assistive device: Walker-rolling Max distance: 70ft   Walk 10 feet activity   Assist     Assist level: Maximal Assistance - Patient 25 - 49% Assistive device: Walker-rolling   Walk 50 feet activity   Assist Walk 50 feet with 2 turns activity did not occur: Safety/medical concerns         Walk 150 feet activity   Assist Walk 150  feet activity did not occur: Safety/medical concerns         Walk 10 feet on uneven surface  activity   Assist Walk 10 feet on uneven surfaces activity did not occur: Safety/medical concerns         Wheelchair     Assist Will patient use wheelchair at discharge?: Yes             Wheelchair 50 feet with 2 turns activity    Assist            Wheelchair 150 feet activity     Assist            Medical Problem List and Plan: 1.Left side weakness and hemisensory deficitssecondary to C3-4, C7  myelitis  IV Solu-Medrolcompleted on 08/04/2018.  Continue CIR  PRAFO ordered 2. Antithrombotics: DVT/anticoagulation:Acute DVT left gastrocnemius vein.Transitioned from Lovenox to Eliquis Antiplatelet therapy: N/A 3. Pain Management/fibromyalgia/migraine headaches:Valproic acid 250 mg twice a day Lyrica 50 mg 3 times a day,oxycodone as needed  Will need encouragement and reassurance 4. Mood:Xanax 0.5 mg 3 times a day as needed  Severe anxiety at present, will consider Lexapro pending response to therapies today  Will require reassurance -antipsychotic agents: N/A 5. Neuropsych: This patientiscapable of making decisions on herown behalf. 6. Skin/Wound Care:Routine skin checks 7. Fluids/Electrolytes/Nutrition:Routine in and out's   BUN trending up  Labs ordered for tomorrow  Encourage fluids 8. GERD. Protonix 9. Tobacco abuse. Counseling 10.  Hypoalbuminemia  Supplement initiated on 4/1 11.  Acute blood loss anemia  Hemoglobin 11.9 on 4/29  Continue to monitor 12.  Neurogenic bowel/bladder  Timed toileting  PVRs ordered  Sitter medications   LOS: 2 days A FACE TO FACE EVALUATION WAS PERFORMED  Gotti Alwin Lorie Phenix 08/05/2018, 1:31 PM

## 2018-08-05 NOTE — Progress Notes (Signed)
Occupational Therapy Session Note  Patient Details  Name: Susan Hodge MRN: [id number] Date of Birth: 10-11-1950  Today's Date: 08/05/2018 OT Individual Time: 7943-2761 OT Individual Time Calculation (min): 59 min    Short Term Goals: Week 1:  OT Short Term Goal 1 (Week 1): STGs equal to LTGs set a supervision.  Skilled Therapeutic Interventions/Progress Updates:    Pt completed bathing, dressing, and toileting during session.  Mod assist for stand pivot transfer to the wheelchair and the 3:1.  Mod assist for managing brief with supervision for hygiene in sitting.  She then completed 3-4 steps from the toilet to the shower bench with max assist.  Bathing was completed with mod assist sit to stand.  She then completed UB dressing with supervision in sitting as well with mod assist for LB dressing sit to stand.  Grooming tasks were completed from wheelchair level with setup.  Pt with increased anxiousness with sit to stand and transfers secondary to LLE weakness.  Pt left with chair alarm in place and call button and phone in reach.   Therapy Documentation Precautions:  Precautions Precautions: Fall Precaution Comments: L LE numbness, weakness Restrictions Weight Bearing Restrictions: No  Pain: Pain Assessment Pain Scale: 0-10 Pain Score: 0-No pain Faces Pain Scale: Hurts a little bit Pain Type: Acute pain Pain Location: Hip Pain Orientation: Left Pain Descriptors / Indicators: Discomfort Pain Onset: With Activity Pain Intervention(s): Repositioned ADL: See Care Tool Section for details  Therapy/Group: Individual Therapy  Emmaus Brandi OTR/L 08/05/2018, 11:51 AM

## 2018-08-05 NOTE — Progress Notes (Signed)
Patient is incontinent of bowel and bladder. Will ring out and ask to use the bathroom after she goes. States she cant hold it. Patient c/o left hip pain and left leg numbness. States she cannot lift her leg. Patient was given anxiety medication prior to solu-medrol administration and she tolerated it well. Will continue to monitor.

## 2018-08-05 NOTE — Progress Notes (Signed)
Physical Therapy Session Note  Patient Details  Name: Susan Hodge MRN: [id number] Date of Birth: [birth year]  Today's Date: 08/05/2018 PT Individual Time: 1550-1703 PT Individual Time Calculation (min): 73 min   Short Term Goals: Week 1:  PT Short Term Goal 1 (Week 1): Pt will perform bed<>chair transfers with no more than mod assist PT Short Term Goal 2 (Week 1): Pt will ambulate at least 69ft using LRAD with no more than mod assist PT Short Term Goal 3 (Week 1): Pt will ascend/descend 4 steps using handrails with no more than mod assist  PT Short Term Goal 4 (Week 1): Pt will perform supine<>sit with no more than min assist  Skilled Therapeutic Interventions/Progress Updates:    Pt received sitting in recliner and agreeable to therapy session despite reporting she has had an emotional day because the steroids make her feel "loopy." Pt notified RN for medication administration to assist with decreased anxiety during therapy session - therapist provided emotional support and encouragement throughout session with pt demonstrating high level of participation. Pt performed squat pivot transfers between recliner<>w/c<>toilet<>w/c<>mat table all with close supervision for safety throughout therapy session with pt demonstrating ability to properly place L LE prior to transfers without cuing. Pt reported needing to use bathroom and noted to be incontinent of urine in brief and continent of urine on toilet - pt required total assist for LB clothing management with B UE support on grab bar and performed seated peri-care with supervision. Pt transported to/form therapy gym in w/c. Pt performed sit<>stand from mat, UE support on therapist, with mod assist for lifting and balance throughout therapy session. Pt performed standing weight shifting progressed to pre-gait neuromuscular re-education of stepping forward/backwards with R LE focusing on L LE stance phase - therapist blocking L LE knee buckling with  manual facilitation and tactile cuing for hip and knee extension and visual feedback with verbal cuing for improved upright posture - pt initially required max assist for L knee control and balance progressed to mod assist and occasional min assist. Pt performed sidelying L LE gravity eliminated neuromuscular re-education on powder board of reversal between hip and knee flexion to simulate swing phase of gait to hip and knee extension to simulate stance phase of gait as well as isolated knee flexion and extension - manual facilitation provided primarily for the flexion portions of the movements due to pt demonstrating extensor tone in the LE. Pt transported back to room and left seated in w/c with needs in reach and seat belt alarm on.   Therapy Documentation Precautions:  Precautions Precautions: Fall Precaution Comments: L LE numbness, weakness Restrictions Weight Bearing Restrictions: No  Pain: Reports no pain during session.    Therapy/Group: Individual Therapy  Tawana Scale, PT, DPT 08/05/2018, 1:17 PM

## 2018-08-05 NOTE — Progress Notes (Signed)
Physical Therapy Session Note  Patient Details  Name: Susan Hodge MRN: [id number] Date of Birth: June 03, 1950  Today's Date: 08/05/2018 PT Individual Time: 1030-1145 PT Individual Time Calculation (min): 75 min   Short Term Goals: Week 1:  PT Short Term Goal 1 (Week 1): Pt will perform bed<>chair transfers with no more than mod assist PT Short Term Goal 2 (Week 1): Pt will ambulate at least 43ft using LRAD with no more than mod assist PT Short Term Goal 3 (Week 1): Pt will ascend/descend 4 steps using handrails with no more than mod assist  PT Short Term Goal 4 (Week 1): Pt will perform supine<>sit with no more than min assist  Skilled Therapeutic Interventions/Progress Updates:    Patient in w/c in room and agreeable to PT.  Propelled w/c x 100' with min A to S and cues with increased time, removed armrest on R due to IV in forearm.  Patient sit to stand min A and ambulated x 13' with mod to max A due to L knee buckling, noted steppage and increased time for L LE progression.  Patient transferred to w/c with RW min A stand step, then negotiated 2 steps with rails (2 to ascend, 1 with 2 hand support to descend) with max A L knee buckled x 1 max A recovery with cues.  Patient scoot-squat pivot to R after demonstration with min A.  Sit to supine mod A for LE's.  Performed LE therex with A consisting of bridging (but terminated due to back pain increased,) post pelvic tilts x 7 w/ 5 sec hold, quad sets on L x 10- 5 sec hold, adductor squeezes in hooklying x 10- 5 sec hold, hip abduction in hooklying with yellow t-band cues for R leg still x 10.  Rolled to L mod A and side to sit mod A with cues.  Patient transferred back to w/c via scoot pivot to L with cues and min A.  Assisted in w/c to her room.  Sit to stand min A and gait with RW to recliner x 5' mod A.  Left in recliner with alarm belt and call bell/table in reach.   Therapy Documentation Precautions:  Precautions Precautions:  Fall Precaution Comments: L LE numbness, weakness Restrictions Weight Bearing Restrictions: No Pain: Pain Assessment Pain Score: 4  Pain Type: Acute pain Pain Location: Back Pain Orientation: Left;Mid Pain Descriptors / Indicators: Aching Pain Onset: On-going Pain Intervention(s): Repositioned    Therapy/Group: Individual Therapy  Reginia Naas  Magda Kiel, PT 08/05/2018, 4:10 PM

## 2018-08-06 ENCOUNTER — Inpatient Hospital Stay (HOSPITAL_COMMUNITY): Payer: Medicare HMO | Admitting: Occupational Therapy

## 2018-08-06 ENCOUNTER — Inpatient Hospital Stay (HOSPITAL_COMMUNITY): Payer: Medicare HMO | Admitting: Physical Therapy

## 2018-08-06 DIAGNOSIS — N179 Acute kidney failure, unspecified: Secondary | ICD-10-CM

## 2018-08-06 DIAGNOSIS — G8918 Other acute postprocedural pain: Secondary | ICD-10-CM

## 2018-08-06 LAB — BASIC METABOLIC PANEL
Anion gap: 5 (ref 5–15)
BUN: 31 mg/dL — ABNORMAL HIGH (ref 8–23)
CO2: 31 mmol/L (ref 22–32)
Calcium: 8.2 mg/dL — ABNORMAL LOW (ref 8.9–10.3)
Chloride: 99 mmol/L (ref 98–111)
Creatinine, Ser: 1.03 mg/dL — ABNORMAL HIGH (ref 0.44–1.00)
GFR calc Af Amer: >60 mL/min (ref >60)
GFR calc non Af Amer: 56 mL/min — ABNORMAL LOW (ref >60)
Glucose, Bld: 86 mg/dL (ref 70–99)
Potassium: 4.1 mmol/L (ref 3.5–5.1)
Sodium: 135 mmol/L (ref 135–145)

## 2018-08-06 NOTE — Progress Notes (Signed)
Occupational Therapy Session Note  Patient Details  Name: Susan Hodge MRN: [id number] Date of Birth: 1950/09/29  Today's Date: 08/06/2018 OT Individual Time: 9518-8416 OT Individual Time Calculation (min): 71 min    Short Term Goals: Week 1:  OT Short Term Goal 1 (Week 1): STGs equal to LTGs set a supervision.  Skilled Therapeutic Interventions/Progress Updates:    Pt completed toileting at start of session.  She was able to transfer from the [facility] over the toilet with max assist stand pivot.  Decreased ability to advance the LLE with the transfer.  She then pivoted over to the walk-in shower transfer with max assist as well.  Min assist for for bathing in sitting with latera leans side to side on the shower seat.  Once episode of leaning to the left where she was too close to the [place] almost resulting in fall secondary to the LLE not being able to keep her from sliding forward at the edge of the seat.  She transferred to the wheelchair with mod assist using the RW.  Dressing sit to stand at the sink with supervision for UB and mod assist for LB.  Once complete she brushed her hair and her teeth with setup.  Finished session with pt working from wheelchair level to put some of her clothes away in the drawers of her dresser via wheelchair.  Mod instructional cueing to remember to lock both wheelchair brakes when reaching down into the drawers.  Finished session with call button and phone in reach and pt up in the wheelchair.  Alarm belt in place as well.   Therapy Documentation Precautions:  Precautions Precautions: Fall Precaution Comments: L LE numbness, weakness Restrictions Weight Bearing Restrictions: No  Pain: Pain Assessment Pain Scale: 0-10 Pain Score: 7  Pain Type: Acute pain Pain Location: Back Pain Orientation: Lower Pain Descriptors / Indicators: Burning Pain Onset: With Activity Pain Intervention(s): Emotional support;Repositioned Multiple Pain Sites: No ADL: See Care  Tool Section for some details of ADL  Therapy/Group: Individual Therapy  Wm Sahagun OTR/L 08/06/2018, 9:14 AM

## 2018-08-06 NOTE — Progress Notes (Addendum)
Physical Therapy Session Note  Patient Details  Name: Susan Hodge MRN: [id number] Date of Birth: Feb 10, 1951  Today's Date: 08/06/2018 PT Individual Time: 1025-1057 PT Individual Time Calculation (min): 32 min   Short Term Goals: Week 1:  PT Short Term Goal 1 (Week 1): Pt will perform bed<>chair transfers with no more than mod assist PT Short Term Goal 2 (Week 1): Pt will ambulate at least 33ft using LRAD with no more than mod assist PT Short Term Goal 3 (Week 1): Pt will ascend/descend 4 steps using handrails with no more than mod assist  PT Short Term Goal 4 (Week 1): Pt will perform supine<>sit with no more than min assist  Skilled Therapeutic Interventions/Progress Updates:  Pt received in w/c & agreeable to tx. Pt propels w/c room>gym with BUE & supervision with frequent (4-5) rest breaks 2/2 BUE fatigue & soreness.  Pt transfers sit<>stand with mod assist and pt attempts to take 2 steps with clear difficulties clearing LLE from floor so pt returned to sitting and therapist applied ace wrap to provide dorsiflexion assist and help correct inversion. Pt ambulates 12 ft with RW & mod assist with vaulting to help clear LLE, as well as pt attempting to swing LLE through 2/2 L hip, knee and dorsiflexor weakness. Pt reports feeling dizzy after gait & BP assessed in sitting & standing. Pt returned to room via w/c dependent assist & RN made aware of pt's c/o feeling unwell as well as BP readings with RN suggesting pt return to bed to rest. Pt transfers bed>w/c with min assist and sit>supine with min assist. Pt left in bed with alarm set & needs at hand.   BP = 89/47 mmHg (LUE, sitting) HR = 67 bpm  BP = 84/53 mmHg (LUE, standing) with pt c/o "not feeling right" HR = 97 bpm  Therapy Documentation Precautions:  Precautions Precautions: Fall Precaution Comments: L LE numbness, weakness Restrictions Weight Bearing Restrictions: No   General: PT Amount of Missed Time (min): 43 Minutes PT  Missed Treatment Reason: Patient ill (Comment)(low BP)   Pain: Pt reports BUE soreness, back burning (did not rate), & fatigue but did not want to request pain meds yet.  Pt also with c/o HA & RN made aware.   Therapy/Group: Individual Therapy  Waunita Schooner 08/06/2018, 11:05 AM

## 2018-08-06 NOTE — Progress Notes (Signed)
Occupational Therapy Session Note  Patient Details  Name: Susan Hodge MRN: [id number] Date of Birth: [birth year]  Today's Date: 08/06/2018 OT Individual Time: 5284-1324 OT Individual Time Calculation (min): 61 min    Short Term Goals: Week 1:  OT Short Term Goal 1 (Week 1): STGs equal to LTGs set a supervision.  Skilled Therapeutic Interventions/Progress Updates:    Pt in wheelchair to start session, reported feeling a little "funny".  BP taken X2 in sitting at 86/48 and 80/55.  She was then transferred to supine where it was taken for the third time at 95/54.  Discussed these readings with nursing and opted to see pt in supine as she reported not feeling as "woosy" when laying down.  Had pt complete therex exercises on BUEs as listed below with min instructional cueing for technique.  Also had her complete 2 sets of 7 repetitions in the LLE for hip flexion, from supine as well with mod facilitation.  Pt left in bed at end of session with all items in reach and bed alarm in place.    Therapy Documentation Precautions:  Precautions Precautions: Fall Precaution Comments: L LE numbness, weakness Restrictions Weight Bearing Restrictions: No   Vital Signs: Therapy Vitals Temp: 98.4 F (36.9 C) Temp Source: Oral Pulse Rate: 72 Resp: 20 BP: (!) 90/51 Patient Position (if appropriate): Orthostatic Vitals Oxygen Therapy SpO2: 100 % O2 Device: Room Air Pain: Pain Assessment Pain Scale: Faces Pain Score: 0-No pain ADL: ADL Eating: Independent Where Assessed-Eating: Wheelchair Grooming: Setup Where Assessed-Grooming: Sitting at sink, Wheelchair Upper Body Bathing: Setup Where Assessed-Upper Body Bathing: Wheelchair, Sitting at sink Lower Body Bathing: Minimal assistance Where Assessed-Lower Body Bathing: Wheelchair, Sitting at sink, Standing at sink Upper Body Dressing: Supervision/safety Where Assessed-Upper Body Dressing: Sitting at sink Lower Body Dressing: Moderate  assistance Where Assessed-Lower Body Dressing: Wheelchair Toileting: Moderate assistance Where Assessed-Toileting: Bedside Commode Toilet Transfer: Maximal assistance Toilet Transfer Method: Stand pivot Toilet Transfer Equipment: Bedside commode   Exercises: General Exercises - Upper Extremity Shoulder Flexion: Strengthening;10 reps;Both;Supine;Theraband Theraband Level (Shoulder Flexion): Level 2 (Red) Shoulder Horizontal ABduction: Strengthening;Both;10 reps;Supine;Theraband Theraband Level (Shoulder Horizontal Abduction): Level 2 (Red) Elbow Flexion: Strengthening;Both;20 reps;Supine;Theraband Theraband Level (Elbow Flexion): Level 2 (Red) Elbow Extension: Strengthening;Both;20 reps;Supine   Therapy/Group: Individual Therapy  Lorriane Dehart OTR/L 08/06/2018, 4:18 PM

## 2018-08-06 NOTE — Progress Notes (Signed)
Susan Hodge PHYSICAL MEDICINE & REHABILITATION PROGRESS NOTE  Subjective/Complaints: Patient seen laying in bed this morning.  She states she slept fairly overnight due to urinary frequency.  She states that she was drinking a lot of water due to steroids.  Encouraged decrease fluid intake prior to bed.  ROS: Denies CP, shortness of breath, nausea, vomiting, diarrhea.  Objective: Vital Signs: Blood pressure (!) 92/56, pulse 67, temperature 98.2 F (36.8 C), temperature source Oral, resp. rate 18, height 5' (1.524 m), SpO2 98 %. No results found. Recent Labs    08/04/18 0613  WBC 7.4  HGB 11.9*  HCT 34.9*  PLT 185   Recent Labs    08/04/18 0613 08/06/18 0502  NA 137 135  K 4.1 4.1  CL 100 99  CO2 27 31  GLUCOSE 124* 86  BUN 31* 31*  CREATININE 0.90 1.03*  CALCIUM 8.8* 8.2*    Physical Exam: BP (!) 92/56 (BP Location: Left Arm)   Pulse 67   Temp 98.2 F (36.8 C) (Oral)   Resp 18   Ht 5' (1.524 m)   SpO2 98%   BMI 24.58 kg/m  Constitutional: No distress . Vital signs reviewed. HENT: Normocephalic.  Atraumatic. Eyes: EOMI. No discharge. Cardiovascular: RRR.  No JVD. Respiratory: CTA bilaterally.  Normal effort. GI: BS +. Non-distended. Musc: No edema or tenderness in extremities. Neurological: She isalertand oriented Motor: Bilateral upper extremities: 5/5 proximal distal Right lower extremity: 4-4+/5 proximal distal, unchanged Left lower extremity: Hip flexion, knee extension 2+/5, ankle dorsiflexion 1/5, unchanged Sensation diminished to light touch LLE > RLE. Skin: Warm and dry.  Intact. Psychiatric: Normal mood.  Normal affect.  Assessment/Plan: 1. Functional deficits secondary to C3-4, C7 myelitis which require 3+ hours per day of interdisciplinary therapy in a comprehensive inpatient rehab setting.  Physiatrist is providing close team supervision and 24 hour management of active medical problems listed below.  Physiatrist and rehab team continue to  assess barriers to discharge/monitor patient progress toward functional and medical goals  Care Tool:  Bathing    Body parts bathed by patient: Right arm, Chest, Left arm, Right upper leg, Left upper leg, Right lower leg, Abdomen, Front perineal area, Left lower leg, Face, Buttocks(sitting with lateral leans side to side)         Bathing assist Assist Level: Minimal Assistance - Patient > 75%     Upper Body Dressing/Undressing Upper body dressing   What is the patient wearing?: Pull over shirt, Bra    Upper body assist Assist Level: Set up assist    Lower Body Dressing/Undressing Lower body dressing      What is the patient wearing?: Pants, Incontinence brief     Lower body assist Assist for lower body dressing: Moderate Assistance - Patient 50 - 74%     Toileting Toileting    Toileting assist Assist for toileting: Moderate Assistance - Patient 50 - 74%     Transfers Chair/bed transfer  Transfers assist     Chair/bed transfer assist level: Supervision/Verbal cueing(squat pivot)     Locomotion Ambulation   Ambulation assist      Assist level: Maximal Assistance - Patient 25 - 49% Assistive device: Walker-rolling Max distance: 13'   Walk 10 feet activity   Assist     Assist level: Maximal Assistance - Patient 25 - 49% Assistive device: Walker-rolling   Walk 50 feet activity   Assist Walk 50 feet with 2 turns activity did not occur: Safety/medical concerns  Walk 150 feet activity   Assist Walk 150 feet activity did not occur: Safety/medical concerns         Walk 10 feet on uneven surface  activity   Assist Walk 10 feet on uneven surfaces activity did not occur: Safety/medical concerns         Wheelchair     Assist Will patient use wheelchair at discharge?: Yes Type of Wheelchair: Manual    Wheelchair assist level: Minimal Assistance - Patient > 75% Max wheelchair distance: 100'    Wheelchair 50 feet with 2  turns activity    Assist        Assist Level: Minimal Assistance - Patient > 75%   Wheelchair 150 feet activity     Assist            Medical Problem List and Plan: 1.Left side weakness and hemisensory deficitssecondary to C3-4, C7 myelitis  IV Solu-Medrolcompleted on 08/04/2018.  Continue CIR  PRAFO ordered 2. Antithrombotics: DVT/anticoagulation:Acute DVT left gastrocnemius vein.Transitioned from Lovenox to Eliquis Antiplatelet therapy: N/A 3. Pain Management/fibromyalgia/migraine headaches:Valproic acid 250 mg twice a day Lyrica 50 mg 3 times a day,oxycodone as needed  Will need encouragement and reassurance 4. Mood:Xanax 0.5 mg 3 times a day as needed  Severe anxiety at present, will consider Lexapro pending response to therapies today  Will require reassurance -antipsychotic agents: N/A 5. Neuropsych: This patientiscapable of making decisions on herown behalf. 6. Skin/Wound Care:Routine skin checks 7. Fluids/Electrolytes/Nutrition:Routine in and out's  8. GERD. Protonix 9. Tobacco abuse. Counseling 10.  Hypoalbuminemia  Supplement initiated on 4/1 11.  Acute blood loss anemia  Hemoglobin 11.9 on 4/1  Labs ordered for Monday  Continue to monitor 12.  Neurogenic bowel/bladder  Timed toileting  Only 1 PVR performed, suggesting retention, await further PVRs  Will consider medications 13.  AKI  Creatinine 1.03 on 4/3  Labs ordered for Monday  Encourage fluids  LOS: 3 days A FACE TO FACE EVALUATION WAS PERFORMED  Osias Resnick Lorie Phenix 08/06/2018, 10:10 AM

## 2018-08-06 NOTE — IPOC Note (Signed)
Overall Plan of Care [facility]) Patient Details Name: Susan Hodge MRN: [id number] DOB: April 02, 1951  Admitting Diagnosis: Myelitis  Hospital Problems: Active Problems:   Myelitis ([place])   Acute blood loss anemia   Hypoalbuminemia due to protein-calorie malnutrition ([facility])   Elevated BUN   Postoperative pain   Chronic pain syndrome   Neurogenic bowel   Neurogenic bladder   Anxiety state     Functional Problem List: Nursing    PT Balance, Pain, Endurance, Sensory, Motor  OT Balance, Motor, Endurance, Safety, Cognition, Pain  SLP    TR         Basic ADL's: OT Grooming, Bathing, Dressing, Toileting     Advanced  ADL's: OT Simple Meal Preparation, Full Meal Preparation     Transfers: PT Bed Mobility, Bed to Chair, Car, Patent attorney, Agricultural engineer: PT Ambulation, Emergency planning/management officer, Stairs     Additional Impairments: OT None  SLP        TR      Anticipated Outcomes Item Anticipated Outcome  Self Feeding independent  Swallowing      Basic self-care  supervision  Toileting  supervision   Bathroom Transfers supervision  Bowel/Bladder     Transfers  CGA for bed<>chair transfers  Locomotion  Min assist ambulation with LRAD  Communication     Cognition     Pain     Safety/Judgment      Therapy Plan: PT Intensity: Minimum of 1-2 x/day ,45 to 90 minutes PT Frequency: 5 out of 7 days PT Duration Estimated Length of Stay: 2.5 - 3 weeks OT Intensity: Minimum of 1-2 x/day, 45 to 90 minutes OT Frequency: 5 out of 7 days OT Duration/Estimated Length of Stay: 7-10 days      Team Interventions: Nursing Interventions    PT interventions Ambulation/gait training, Discharge planning, DME/adaptive equipment instruction, Functional mobility training, Pain management, Psychosocial support, Splinting/orthotics, Therapeutic Activities, UE/LE Strength taining/ROM, Training and development officer, Community reintegration, Disease  management/prevention, Technical sales engineer stimulation, Neuromuscular re-education, Patient/family education, Skin care/wound management, Stair training, Therapeutic Exercise, UE/LE Coordination activities, Wheelchair propulsion/positioning  OT Interventions Training and development officer, Discharge planning, Community reintegration, Barrister's clerk education, Self Care/advanced ADL retraining, Therapeutic Exercise, UE/LE Coordination activities, DME/adaptive equipment instruction, Therapeutic Activities, UE/LE Strength taining/ROM, Pain management, Functional mobility training  SLP Interventions    TR Interventions    SW/CM Interventions Discharge Planning, Psychosocial Support, Patient/Family Education   Barriers to Discharge MD  Medical stability, Neurogenic bowel and bladder and Behavior  Nursing      PT Inaccessible home environment, Home environment access/layout    OT      SLP      SW       Team Discharge Planning: Destination: PT-Home ,OT- Home , SLP-  Projected Follow-up: PT-Home health PT, OT-  Home health OT, SLP-  Projected Equipment Needs: PT-To be determined, OT- To be determined, SLP-  Equipment Details: PT-pt has RW, w/c, BSC, cane, OT-pt has small shower seat, may need tub bench, will update as needed. Patient/family involved in discharge planning: PT- Patient,  OT-Patient, SLP-   MD ELOS: 8-12 days. Medical Rehab Prognosis:  Excellent Assessment: 68 year old right-handed female with history of migraine headaches maintained on valproic acid,, fibromyalgia, lupus and tobacco abuse. Presented 07/31/2018 with recent fall 2 weeks ago landing on her buttocks noted progressive lower extremity weakness. Denied any bowel or bladder disturbances. MRI the brain negative for acute changes however there was a chronic right MCA infarction. CT lumbar thoracic and  cervical spine showed multifocal T2 signal abnormality and cervical spinal cord including enhancing lesion at C3-4 and 5 as  well as C7. Cervical disc degeneration C6-7. Lumbar puncture completed WBC 14 lymphs 83,, glucose 64. Venous Doppler studies lower extremity showed acute DVT left gastrocnemius vein. Patient transitioned to Eliquis 08/03/2018. Neurology consulted workup for myelitis at C3-4 and C7. Placed on IV Solu-Medrol through 08/04/2018. Patient with resulting functional deficits with mobility, transfers, self-care, further complicated by anxiety. Will set goals for Supervision.  See Team Conference Notes for weekly updates to the plan of care

## 2018-08-06 NOTE — Progress Notes (Signed)
Social Work Patient ID: Susan Hodge, female   DOB: [birth year], 68 y.o.   MRN: [id number] Met with pt and spoke with [name] to discuss team conference since she had just gotten her eon Tuesday therapy team was evaluating and setting goals. Discussed goals are supervision-min level and target 2-3 weeks hereon rehab. Will have more information on Wed and will have a target discharge date we are shooting for. Pt voiced she is feeling better now that her steroids are finished and she is moving better. She hopes she will not be here 3 weeks. Will continue to work on discharge needs.

## 2018-08-07 ENCOUNTER — Inpatient Hospital Stay (HOSPITAL_COMMUNITY): Payer: Medicare HMO | Admitting: Physical Therapy

## 2018-08-07 ENCOUNTER — Inpatient Hospital Stay (HOSPITAL_COMMUNITY): Payer: Medicare HMO

## 2018-08-07 NOTE — Progress Notes (Signed)
Occupational Therapy Session Note  Patient Details  Name: Susan Hodge MRN: [id number] Date of Birth: 1951/01/28  Today's Date: 08/07/2018 OT Individual Time: 1104-1200 OT Individual Time Calculation (min): 56 min    Short Term Goals: Week 1:  OT Short Term Goal 1 (Week 1): STGs equal to LTGs set a supervision.  Skilled Therapeutic Interventions/Progress Updates:    1;1 Pt received in bed semi reclined reporting same low BP as yesterday. Pt also reporting soiled brief. Pt sits EOB with no increase in stmptoms to wash UB per pt request. Pt dons pull over shirt with set up. tp completes LB bathing/dressing in semi reclined/supine for washing peri area dn clothing management rolling B directions. After dressing BP assessed at 95/47. Ot continues to educate on increaseding fluids to raise BP (not coffee).Pt reporting need to void bladder again. Pt completes squat pivot transfer EOB<>DAC with CGA. OT completes clothing management while pt completes w/c push up to elevate hips off DAC. Pt able to complete hygiene. Exited session with pt seated in bed, exit alarm on and call light in reach.  Therapy Documentation Precautions:  Precautions Precautions: Fall Precaution Comments: L LE numbness, weakness Restrictions Weight Bearing Restrictions: Yes RLE Weight Bearing: Weight bearing as tolerated General:     Therapy/Group: Individual Therapy  Tonny Branch 08/07/2018, 11:36 AM

## 2018-08-07 NOTE — Progress Notes (Signed)
Physical Therapy Session Note  Patient Details  Name: Susan Hodge MRN: [id number] Date of Birth: 1951/04/07  Today's Date: 08/07/2018 PT Individual Time: [phone] and [phone]  PT Individual Time Calculation (min): 58 min and 76 min   Short Term Goals: Week 1:  PT Short Term Goal 1 (Week 1): Pt will perform bed<>chair transfers with no more than mod assist PT Short Term Goal 2 (Week 1): Pt will ambulate at least 10ft using LRAD with no more than mod assist PT Short Term Goal 3 (Week 1): Pt will ascend/descend 4 steps using handrails with no more than mod assist  PT Short Term Goal 4 (Week 1): Pt will perform supine<>sit with no more than min assist  Skilled Therapeutic Interventions/Progress Updates:   Session 1: Pt received supine in bed and agreeable to therapy session reporting she feels tired today despite sleeping well last night. Pt denies pain throughout session. Vitals assessed in supine: BP 88/49, HR 79bpm. Pt requesting to use bathroom. Therapist donned thigh high TED hose to assist with BP management and notified RN of BP. Performed supine to sit with CGA and pt reporting "lightheadedness" upon sitting EOB vitals reassessed BP 91/39 HR 80bpm. Pt performed squat pivot transfer bed to w/c with CGA for safety/steadying. Therapist transported pt to/from bathroom in w/c. Pt performed stand pivot transfer w/c to toilet using grab bars with min assist for balance and total assist for LB clothing management. Reassessed vitals wit pt reports of feeling "lightheaded" BP 87/47 (MAP 61), HR 79bpm. Pt incontinent of bladder in brief and no voiding on toilet. Pt performed seated pericare on toilet with supervision for safety. Performed stand pivot transfer toilet to w/c using grab bars with mod assist for L LE placement and balance - total assist for LB clothing management. Performed squat pivot transfer w/c to bed with CGA for safety. Performed sit to supine with min assist for L LE management.  Performed R sidelying L LE gravity eliminated reciprocal neuromuscular re-education of hip and knee flexion to hip and knee extension with ankle plantarflexion to replicate reciprocal pattern of gait 2 sets of 15 - resistance provided against extension with min assist to perform flexion portion due to impaired muscular activation. Performed supine bridging with therapist providing manual facilitation for L LE weightbearing and tactile facilitation for gluteal activation 2 sets of 15 reps with verbal cuing for increased range of movement. Pt's vitals reassessed in supine at end of session 94/48 (MAP 62) HR 72bpm. Pt left supine in bed with needs in reach and bed alarm on.  Session 2: Pt received supine in bed and agreeable to therapy session. Vitals assessed throughout session. Thigh high TED hose still donned since AM session. Vitals in supine BP 99/58 HR 68 bpm. Pt requesting to use bathroom. Performed supine to sit using bed rails with supervision and cuing for use of R LE to assist LLE to EOB. Performed squat pivot transfer between w/c<>EOB using bedrails and w/c armrests for UE support with CGA for safety. Performed stand pivot transfer w/c<>toilet with BSC over it using grab bars for UE support with min assist for balance. Pt able to maintain dynamic standing balance with R UE support on grabbar while using L UE to perform LB clothing management with CGA/min assist for standing balance. Pt performed seated peri-care with supervision for safety. Pt performed stand pivot transfer toilet to w/c using B UE support on grab bars and performing LB dressing with L UE all with min assist  for balance. Vitals reassesed 98/54 (MAP 66) HR 68 - pt reported feeling "lightheaded" and overall reports increased fatigue/sleepiness today. Pt performed squat pivot transfer w/c to EOB with CGA for steadying/safety. Performed sit to supine with min assist for L LE management. Performed L LE quadriceps neuromuscular re-education  using electrical stimulation via short-arch quad exercise focusing on terminal knee extension, isometric muscular contraction, and eccentric control - 2 sets of 10-12 repetitions to fatigue with proper form - intermittent assist needed to achieve final 5 degrees of extension - skin intact where electrodes were located with pt reporting no negative side effects of e-stim. Performed L LE ankle DF neuromuscular re-education for 6 minutes using electrical stimulation with manual facilitation for movement - skin intact where electrodes were located and pt reporting on negative side effects of e-stim. Performed supine L LE D1 dynamic reversals focusing on alternating flexion/extension - 2 sets of 10-12 repetitions with max assist for flexion portion and resistance applied during extension with pt demonstrating activation of quadriceps and plantarflexors - progressed to emphasis on timing of L LE plantarflexion activation. Pt left supine in bed with needs in reach and bed alarm on.   Therapy Documentation Precautions:  Precautions Precautions: Fall Precaution Comments: L LE numbness, weakness Restrictions Weight Bearing Restrictions: No  Vital Signs: Pt noted to have decreased BP as written above with reports of "lightheadedness" and overall feeling of increased tiredness today.  Pain:   Pt denies pain during both sessions.     Therapy/Group: Individual Therapy  Tawana Scale, PT, DPT 08/07/2018, 7:42 AM

## 2018-08-07 NOTE — Progress Notes (Signed)
[facility] PHYSICAL MEDICINE & REHABILITATION PROGRESS NOTE  Subjective/Complaints: Patient seen laying in bed this morning.  She states she slept well overnight.  She states her mouth feels dry.  ROS: Denies CP, shortness of breath, nausea, vomiting, diarrhea.  Objective: Vital Signs: Blood pressure (!) 95/50, pulse 67, temperature 98.1 F (36.7 C), temperature source Oral, resp. rate 19, height 5' (1.524 m), SpO2 97 %. No results found. No results for input(s): WBC, HGB, HCT, PLT in the last 72 hours. Recent Labs    08/06/18 0502  NA 135  K 4.1  CL 99  CO2 31  GLUCOSE 86  BUN 31*  CREATININE 1.03*  CALCIUM 8.2*    Physical Exam: BP (!) 95/50 (BP Location: Left Arm)   Pulse 67   Temp 98.1 F (36.7 C) (Oral)   Resp 19   Ht 5' (1.524 m)   SpO2 97%   BMI 24.58 kg/m  Gen: NAD.  Vital signs reviewed HENT: Normocephalic, Atraumatic Eyes: EOMI. No discharge.  Cardio: No JVD. Pulm: Effort normal Neuro: Alert and oriented Musc: No edema or tenderness in lower extremities. Neurological: She isalertand oriented Motor:  Right lower extremity: 4-4+/5 proximal distal, stable Left lower extremity: Hip flexion, knee extension 2+/5, ankle dorsiflexion 1/5, stable Skin: Warm and dry.  Intact. Psychiatric: Normal mood.  Normal affect.  Assessment/Plan: 1. Functional deficits secondary to C3-4, C7 myelitis which require 3+ hours per day of interdisciplinary therapy in a comprehensive inpatient rehab setting.  Physiatrist is providing close team supervision and 24 hour management of active medical problems listed below.  Physiatrist and rehab team continue to assess barriers to discharge/monitor patient progress toward functional and medical goals  Care Tool:  Bathing    Body parts bathed by patient: Right arm, Chest, Left arm, Right upper leg, Left upper leg, Right lower leg, Abdomen, Front perineal area, Left lower leg, Face, Buttocks(sitting with lateral leans side to  side)     Body parts n/a: Front perineal area   Bathing assist Assist Level: Independent     Upper Body Dressing/Undressing Upper body dressing   What is the patient wearing?: Pull over shirt    Upper body assist Assist Level: Independent    Lower Body Dressing/Undressing Lower body dressing      What is the patient wearing?: Pants, Incontinence brief     Lower body assist Assist for lower body dressing: Maximal Assistance - Patient 25 - 49%     Toileting Toileting    Toileting assist Assist for toileting: Moderate Assistance - Patient 50 - 74%     Transfers Chair/bed transfer  Transfers assist     Chair/bed transfer assist level: Minimal Assistance - Patient > 75%     Locomotion Ambulation   Ambulation assist      Assist level: Moderate Assistance - Patient 50 - 74% Assistive device: Walker-rolling(LLE Ace wrap) Max distance: 12 ft    Walk 10 feet activity   Assist     Assist level: Moderate Assistance - Patient - 50 - 74% Assistive device: Walker-rolling(LLE ace wrap)   Walk 50 feet activity   Assist Walk 50 feet with 2 turns activity did not occur: Safety/medical concerns         Walk 150 feet activity   Assist Walk 150 feet activity did not occur: Safety/medical concerns         Walk 10 feet on uneven surface  activity   Assist Walk 10 feet on uneven surfaces activity did not occur:  Safety/medical concerns         Wheelchair     Assist Will patient use wheelchair at discharge?: Yes Type of Wheelchair: Manual    Wheelchair assist level: Supervision/Verbal cueing Max wheelchair distance: 30 ft     Wheelchair 50 feet with 2 turns activity    Assist        Assist Level: Minimal Assistance - Patient > 75%   Wheelchair 150 feet activity     Assist Wheelchair 150 feet activity did not occur: Safety/medical concerns          Medical Problem List and Plan: 1.Left side weakness and hemisensory  deficitssecondary to C3-4, C7 myelitis  IV Solu-Medrolcompleted on 08/04/2018.  Continue CIR  PRAFO ordered 2. Antithrombotics: DVT/anticoagulation:Acute DVT left gastrocnemius vein.Transitioned from Lovenox to Eliquis Antiplatelet therapy: N/A 3. Pain Management/fibromyalgia/migraine headaches:Valproic acid 250 mg twice a day Lyrica 50 mg 3 times a day,oxycodone as needed  Relatively controlled on 4/4  Will need encouragement and reassurance 4. Mood:Xanax 0.5 mg 3 times a day as needed  Severe anxiety at present, will consider Lexapro pending response to therapies today  Will require reassurance -antipsychotic agents: N/A 5. Neuropsych: This patientiscapable of making decisions on herown behalf. 6. Skin/Wound Care:Routine skin checks 7. Fluids/Electrolytes/Nutrition:Routine in and out's  8. GERD. Protonix 9. Tobacco abuse. Counseling 10.  Hypoalbuminemia  Supplement initiated on 4/1 11.  Acute blood loss anemia  Hemoglobin 11.9 on 4/1  Labs ordered for Monday  Continue to monitor 12.  Neurogenic bowel/bladder  Timed toileting  PVRs relatively unremarkable  Will consider medications if necessary  ?  Improving 13.  AKI  Creatinine 1.03 on 4/3  Labs ordered for Monday  Encourage fluids 14.  Hypotension  Blood pressure low 1/4, however asymptomatic  LOS: 4 days A FACE TO FACE EVALUATION WAS PERFORMED  Susan Hodge Susan Hodge 08/07/2018, 11:58 AM

## 2018-08-08 MED ORDER — DARIFENACIN HYDROBROMIDE ER 7.5 MG PO TB24
7.5000 mg | ORAL_TABLET | Freq: Every day | ORAL | Status: DC
  Administered 2018-08-08 – 2018-08-18 (×11): 7.5 mg via ORAL
  Filled 2018-08-08 (×11): qty 1

## 2018-08-08 NOTE — Progress Notes (Signed)
[place] PHYSICAL MEDICINE & REHABILITATION PROGRESS NOTE  Subjective/Complaints: Patient seen laying in bed this morning.  She states he slept well overnight.  She denies complaints.  ROS: Denies CP, shortness of breath, nausea, vomiting, diarrhea.  Objective: Vital Signs: Blood pressure (!) 100/43, pulse 68, temperature 98 F (36.7 C), temperature source Oral, resp. rate 15, height 5' (1.524 m), SpO2 100 %. No results found. No results for input(s): WBC, HGB, HCT, PLT in the last 72 hours. Recent Labs    08/06/18 0502  NA 135  K 4.1  CL 99  CO2 31  GLUCOSE 86  BUN 31*  CREATININE 1.03*  CALCIUM 8.2*    Physical Exam: BP (!) 100/43 (BP Location: Left Arm)   Pulse 68   Temp 98 F (36.7 C) (Oral)   Resp 15   Ht 5' (1.524 m)   SpO2 100%   BMI 24.58 kg/m  Gen: NAD.  Vital signs reviewed. HENT: Normocephalic.  Atraumatic. Eyes: EOMI. No discharge.  Cardio: No JVD. Pulm: Effort normal Neuro: Alert and oriented Musc: No edema or tenderness in lower extremities. Neurological: She isalertand oriented Motor:  Right lower extremity: 4-4+/5 proximal distal, unchanged Left lower extremity: Hip flexion, knee extension 2+/5, ankle dorsiflexion 1/5, unchanged Skin: Warm and dry.  Intact. Psychiatric: Normal mood.  Normal affect.  Assessment/Plan: 1. Functional deficits secondary to C3-4, C7 myelitis which require 3+ hours per day of interdisciplinary therapy in a comprehensive inpatient rehab setting.  Physiatrist is providing close team supervision and 24 hour management of active medical problems listed below.  Physiatrist and rehab team continue to assess barriers to discharge/monitor patient progress toward functional and medical goals  Care Tool:  Bathing    Body parts bathed by patient: Right arm, Chest, Left arm, Right upper leg, Left upper leg, Right lower leg, Abdomen, Front perineal area, Left lower leg, Face, Buttocks(sitting with lateral leans side to  side)     Body parts n/a: Front perineal area   Bathing assist Assist Level: Independent     Upper Body Dressing/Undressing Upper body dressing   What is the patient wearing?: Pull over shirt    Upper body assist Assist Level: Independent    Lower Body Dressing/Undressing Lower body dressing      What is the patient wearing?: Pants, Incontinence brief     Lower body assist Assist for lower body dressing: Maximal Assistance - Patient 25 - 49%     Toileting Toileting    Toileting assist Assist for toileting: Moderate Assistance - Patient 50 - 74%     Transfers Chair/bed transfer  Transfers assist     Chair/bed transfer assist level: Minimal Assistance - Patient > 75%     Locomotion Ambulation   Ambulation assist      Assist level: Moderate Assistance - Patient 50 - 74% Assistive device: Walker-rolling(LLE Ace wrap) Max distance: 12 ft    Walk 10 feet activity   Assist     Assist level: Moderate Assistance - Patient - 50 - 74% Assistive device: Walker-rolling(LLE ace wrap)   Walk 50 feet activity   Assist Walk 50 feet with 2 turns activity did not occur: Safety/medical concerns         Walk 150 feet activity   Assist Walk 150 feet activity did not occur: Safety/medical concerns         Walk 10 feet on uneven surface  activity   Assist Walk 10 feet on uneven surfaces activity did not occur: Safety/medical concerns  Wheelchair     Assist Will patient use wheelchair at discharge?: Yes Type of Wheelchair: Manual    Wheelchair assist level: Supervision/Verbal cueing Max wheelchair distance: 30 ft     Wheelchair 50 feet with 2 turns activity    Assist        Assist Level: Minimal Assistance - Patient > 75%   Wheelchair 150 feet activity     Assist Wheelchair 150 feet activity did not occur: Safety/medical concerns          Medical Problem List and Plan: 1.Left side weakness and hemisensory  deficitssecondary to C3-4, C7 myelitis  IV Solu-Medrolcompleted on 08/04/2018.  Continue CIR  PRAFO ordered 2. Antithrombotics: DVT/anticoagulation:Acute DVT left gastrocnemius vein.Transitioned from Lovenox to Eliquis Antiplatelet therapy: N/A 3. Pain Management/fibromyalgia/migraine headaches:Valproic acid 250 mg twice a day Lyrica 50 mg 3 times a day,oxycodone as needed  Relatively controlled on 4/5  Will need encouragement and reassurance 4. Mood:Xanax 0.5 mg 3 times a day as needed  Severe anxiety at present, will consider Lexapro pending response to therapies- anxiety improving -antipsychotic agents: N/A 5. Neuropsych: This patientiscapable of making decisions on herown behalf. 6. Skin/Wound Care:Routine skin checks 7. Fluids/Electrolytes/Nutrition:Routine in and out's  8. GERD. Protonix 9. Tobacco abuse. Counseling 10.  Hypoalbuminemia  Supplement initiated on 4/1 11.  Acute blood loss anemia  Hemoglobin 11.9 on 4/1  Labs ordered for tomorrow  Continue to monitor 12.  Neurogenic bowel/bladder  Timed toileting  PVRs relatively unremarkable  Trial Enablex started on 4/5  13.  AKI  Creatinine 1.03 on 4/3  Labs ordered for tomorrow  Encourage fluids 14.  Hypotension  Blood pressure low on 4/5, however asymptomatic  LOS: 5 days A FACE TO FACE EVALUATION WAS PERFORMED  Ankit Lorie Phenix 08/08/2018, 9:29 AM

## 2018-08-09 ENCOUNTER — Inpatient Hospital Stay (HOSPITAL_COMMUNITY): Payer: Self-pay

## 2018-08-09 ENCOUNTER — Inpatient Hospital Stay (HOSPITAL_COMMUNITY): Payer: Medicare HMO | Admitting: Occupational Therapy

## 2018-08-09 ENCOUNTER — Inpatient Hospital Stay (HOSPITAL_COMMUNITY): Payer: Medicare HMO

## 2018-08-09 LAB — CBC WITH DIFFERENTIAL/PLATELET
Abs Immature Granulocytes: 0.05 10*3/uL (ref 0.00–0.07)
Basophils Absolute: 0 10*3/uL (ref 0.0–0.1)
Basophils Relative: 0 %
Eosinophils Absolute: 0.3 10*3/uL (ref 0.0–0.5)
Eosinophils Relative: 3 %
HCT: 35.1 % — ABNORMAL LOW (ref 36.0–46.0)
Hemoglobin: 11.8 g/dL — ABNORMAL LOW (ref 12.0–15.0)
Immature Granulocytes: 1 %
Lymphocytes Relative: 11 %
Lymphs Abs: 1.1 10*3/uL (ref 0.7–4.0)
MCH: 31.1 pg (ref 26.0–34.0)
MCHC: 33.6 g/dL (ref 30.0–36.0)
MCV: 92.6 fL (ref 80.0–100.0)
Monocytes Absolute: 0.8 10*3/uL (ref 0.1–1.0)
Monocytes Relative: 8 %
Neutro Abs: 7.5 10*3/uL (ref 1.7–7.7)
Neutrophils Relative %: 77 %
Platelets: 154 10*3/uL (ref 150–400)
RBC: 3.79 MIL/uL — ABNORMAL LOW (ref 3.87–5.11)
RDW: 13 % (ref 11.5–15.5)
WBC: 9.6 10*3/uL (ref 4.0–10.5)
nRBC: 0 % (ref 0.0–0.2)

## 2018-08-09 LAB — BASIC METABOLIC PANEL
Anion gap: 8 (ref 5–15)
BUN: 16 mg/dL (ref 8–23)
CO2: 27 mmol/L (ref 22–32)
Calcium: 8.4 mg/dL — ABNORMAL LOW (ref 8.9–10.3)
Chloride: 99 mmol/L (ref 98–111)
Creatinine, Ser: 0.77 mg/dL (ref 0.44–1.00)
GFR calc Af Amer: >60 mL/min (ref >60)
GFR calc non Af Amer: >60 mL/min (ref >60)
Glucose, Bld: 100 mg/dL — ABNORMAL HIGH (ref 70–99)
Potassium: 3.9 mmol/L (ref 3.5–5.1)
Sodium: 134 mmol/L — ABNORMAL LOW (ref 135–145)

## 2018-08-09 LAB — CSF IGG: IgG, CSF: 9 mg/dL — ABNORMAL HIGH (ref 0.0–8.6)

## 2018-08-09 LAB — HSV 1/2 AB IGG/IGM CSF
HSV 1/2 Ab Screen IgG, CSF: 4.34 IV — ABNORMAL HIGH (ref <=0.89)
HSV 1/2 Ab, IgM, CSF: 0.25 IV (ref <=0.89)

## 2018-08-09 LAB — HSV TYPE 1 AB, IGG, CSF (REFLEXED): HSV Type 1 Ab, IgG, CSF (Reflexed): 0.31 IV (ref <=0.89)

## 2018-08-09 LAB — HSV TYPE 2 AB IGG, CSF (REFLEXED): HSV Type 2 Ab IgG, CSF (Reflexed): 0.02 IV (ref <=0.89)

## 2018-08-09 NOTE — Progress Notes (Signed)
Physical Therapy Session Note  Patient Details  Name: Susan Hodge MRN: [id number] Date of Birth: Jul 31, 1950  Today's Date: 08/09/2018 PT Individual Time: 9030-0923 and 1330-1445 PT Individual Time Calculation (min): 56 min and 75 min    Short Term Goals: Week 1:  PT Short Term Goal 1 (Week 1): Pt will perform bed<>chair transfers with no more than mod assist PT Short Term Goal 2 (Week 1): Pt will ambulate at least 67ft using LRAD with no more than mod assist PT Short Term Goal 3 (Week 1): Pt will ascend/descend 4 steps using handrails with no more than mod assist  PT Short Term Goal 4 (Week 1): Pt will perform supine<>sit with no more than min assist  Skilled Therapeutic Interventions/Progress Updates:    Session 1: Pt seated in w/c upon PT arrival, agreeable to therapy tx and denies pain. Pt propelled w/c to the gym with supervision x 150 ft. Pt performed squat pivot to mat with min assist, cues for techniques. Pt performed sit<>stands this session from mat without UE support x 5 with emphasis on symmetric LE weightbearing. Pt worked on static and dynamic standing balance this session without UE support to perform lateral weightshifting, manual facilitation for L hip/knee extension, min-mod assist. Trial of L AFO this session for gait training. Pt performed pre-gait with RW and use of AFO stepping in place x 10 steps R LE and x 5 steps L LE. Pt ambulated x 20 ft this session with RW and mod assist, L AFO and therapist blocking L knee during stance. Pt worked on L LE stance control and weightbearing to perform R LE stepping in place without AD, mod assist. Pt ambulated x 30 ft with RW, L AFO and mod assist, therapist blocking L knee during stance and facilitating knee flexion during swing. Pt worked on L LE stance control and quad activation to perform stepping in place with R LE 2 x 5 using RW, min assist and cues for techniques. Pt propelled back to room in w/c with supervision using B UEs x  150 ft.   Session 2: Pt seated in w/c upon PT arrival, agreeable to therapy tx and denies pain. Pt reports having to use bathroom, stand pivot from w/c<>toilet this session with mod assist and mod assist for standing balance while patient performs clothing management. Pt seated in w/c at sink to wash hands with supervision. Pt reports feeling sleepy but not light headed, BP in sitting 90/68. Pt propelled w/c to the gym with supervision using B UEs. Pt participated in body weight supported treadmill training for gait training and neuro re-ed. Within litegait harness system pt worked on Customer service manager with emphasis on symmetric LE weightbearing and L knee control, worked on dynamic standing balance to perform lateral weightshifting in place, worked on L LE stance control to perform R LE stepping in place, all with verbal cues from therapist, manual facilitation for quad activation and mirror for visual feedback. Pt worked on mini squats within [place] for knee control and neuro re-ed, x 10. Pt performed resisted knee extension against therapist's manual resistance x 5. Pt ambulated on treadmill x 2 minutes and x 5 minutes, 0.3-0.4 mph with UE support and using L AFO, therapist facilitating quad activation during L stance and knee flexion during L swing, cues for increased R step length. Pt transported back to room at end of session and left with needs in reach and chair alarm set.   Therapy Documentation Precautions:  Precautions  Precautions: Fall Precaution Comments: L LE numbness, weakness Restrictions Weight Bearing Restrictions: No RLE Weight Bearing: Weight bearing as tolerated    Therapy/Group: Individual Therapy  Netta Corrigan, PT, DPT 08/09/2018, 7:52 AM

## 2018-08-09 NOTE — Progress Notes (Signed)
Occupational Therapy Session Note  Patient Details  Name: DREAMER CARILLO MRN: [id number] Date of Birth: 19-Nov-1950  Today's Date: 08/09/2018 OT Individual Time: 2924-4628 OT Individual Time Calculation (min): 69 min    Short Term Goals: Week 1:  OT Short Term Goal 1 (Week 1): STGs equal to LTGs set a supervision.  Skilled Therapeutic Interventions/Progress Updates:    Pt worked on ADL to start session.  Mod assist for stand pivot transfers throughout session with use of the RW to the wheelchair, toilet, and walk-in shower.  She still exhibits severe difficulty in advancing the LLE with each step and attempts to vault herself up on the RLE to complete this with mod facilitation from therapist for placement.  Increased knee hyperextension is also noted on the left side as well in weightbearing.  Supervision for all UB selfcare with min assist for LB selfcare sit to stand.  Therapist assisted only with TEDs.  She then transitioned to the dayroom where she worked on UE strengthening and endurance with use of the UE ergonometer.  She was able to complete one 10 minute set on level 6 resistance without stopping.  RPMs maintained at 25-30.  HR and O2 sats post exercise 82 BPM with O2 at 98%.  Finished session with pt pushing her wheelchair back to the room and positioned at bed side with call button and phone in reach.   Therapy Documentation Precautions:  Precautions Precautions: Fall Precaution Comments: L LE numbness, weakness Restrictions Weight Bearing Restrictions: No RLE Weight Bearing: Weight bearing as tolerated  Pain: Pain Assessment Pain Scale: Faces Pain Score: 4  Faces Pain Scale: Hurts a little bit Pain Type: Acute pain Pain Location: Head Pain Orientation: Right Pain Descriptors / Indicators: Headache Pain Frequency: Intermittent Pain Onset: With Activity Pain Intervention(s): Repositioned ADL: See Care Tool Section for some details of ADL Therapy/Group: Individual  Therapy  Janie Capp OTR/L 08/09/2018, 9:13 AM

## 2018-08-09 NOTE — Progress Notes (Signed)
PHYSICAL MEDICINE & REHABILITATION PROGRESS NOTE  Subjective/Complaints:  No new c/os this am  ROS: Denies CP, shortness of breath, nausea, vomiting, diarrhea.  Objective: Vital Signs: Blood pressure (!) 110/58, pulse 68, temperature 98.2 F (36.8 C), temperature source Oral, resp. rate 19, height 5' (1.524 m), SpO2 100 %. No results found. No results for input(s): WBC, HGB, HCT, PLT in the last 72 hours. No results for input(s): NA, K, CL, CO2, GLUCOSE, BUN, CREATININE, CALCIUM in the last 72 hours.  Physical Exam: BP (!) 110/58 (BP Location: Left Arm)   Pulse 68   Temp 98.2 F (36.8 C) (Oral)   Resp 19   Ht 5' (1.524 m)   SpO2 100%   BMI 24.58 kg/m  Gen: NAD.  Vital signs reviewed. HENT: Normocephalic.  Atraumatic. Eyes: EOMI. No discharge.  Cardio: No JVD. Pulm: Effort normal Neuro: Alert and oriented Musc: No edema or tenderness in lower extremities. Neurological: She isalertand oriented Motor:  Right lower extremity: 4-4+/5 proximal distal, unchanged Left lower extremity: Hip flexion, knee extension 2+/5, ankle dorsiflexion 1/5, unchanged Skin: Warm and dry.  Intact. Psychiatric: Normal mood.  Normal affect.  Assessment/Plan: 1. Functional deficits secondary to C3-4, C7 myelitis which require 3+ hours per day of interdisciplinary therapy in a comprehensive inpatient rehab setting.  Physiatrist is providing close team supervision and 24 hour management of active medical problems listed below.  Physiatrist and rehab team continue to assess barriers to discharge/monitor patient progress toward functional and medical goals  Care Tool:  Bathing    Body parts bathed by patient: Right arm, Chest, Left arm, Right upper leg, Left upper leg, Right lower leg, Abdomen, Front perineal area, Left lower leg, Face, Buttocks(sitting with lateral leans side to side)     Body parts n/a: Front perineal area   Bathing assist Assist Level: Independent     Upper  Body Dressing/Undressing Upper body dressing   What is the patient wearing?: Pull over shirt    Upper body assist Assist Level: Independent    Lower Body Dressing/Undressing Lower body dressing      What is the patient wearing?: Pants, Incontinence brief     Lower body assist Assist for lower body dressing: Maximal Assistance - Patient 25 - 49%     Toileting Toileting    Toileting assist Assist for toileting: Moderate Assistance - Patient 50 - 74%     Transfers Chair/bed transfer  Transfers assist     Chair/bed transfer assist level: Minimal Assistance - Patient > 75%     Locomotion Ambulation   Ambulation assist      Assist level: Moderate Assistance - Patient 50 - 74% Assistive device: Walker-rolling(LLE Ace wrap) Max distance: 12 ft    Walk 10 feet activity   Assist     Assist level: Moderate Assistance - Patient - 50 - 74% Assistive device: Walker-rolling(LLE ace wrap)   Walk 50 feet activity   Assist Walk 50 feet with 2 turns activity did not occur: Safety/medical concerns         Walk 150 feet activity   Assist Walk 150 feet activity did not occur: Safety/medical concerns         Walk 10 feet on uneven surface  activity   Assist Walk 10 feet on uneven surfaces activity did not occur: Safety/medical concerns         Wheelchair     Assist Will patient use wheelchair at discharge?: Yes Type of Wheelchair: Manual  Wheelchair assist level: Supervision/Verbal cueing Max wheelchair distance: 30 ft     Wheelchair 50 feet with 2 turns activity    Assist        Assist Level: Minimal Assistance - Patient > 75%   Wheelchair 150 feet activity     Assist Wheelchair 150 feet activity did not occur: Safety/medical concerns          Medical Problem List and Plan: 1.Left side weakness and hemisensory deficitssecondary to C3-4, C7 myelitis  IV Solu-Medrolcompleted on 08/04/2018.  Continue CIR  PRAFO  ordered 2. Antithrombotics: DVT/anticoagulation:Acute DVT left gastrocnemius vein.Transitioned from Lovenox to Eliquis Antiplatelet therapy: N/A 3. Pain Management/fibromyalgia/migraine headaches:Valproic acid 250 mg twice a day Lyrica 50 mg 3 times a day,oxycodone as needed  Relatively controlled on 4/5   4. Mood:Xanax 0.5 mg 3 times a day as needed  Severe anxiety at present, will consider Lexapro pending response to therapies- anxiety improving -antipsychotic agents: N/A 5. Neuropsych: This patientiscapable of making decisions on herown behalf. 6. Skin/Wound Care:Routine skin checks 7. Fluids/Electrolytes/Nutrition:Routine in and out's  8. GERD. Protonix 9. Tobacco abuse. Counseling 10.  Hypoalbuminemia  Supplement initiated on 4/1 11.  Acute blood loss anemia  Hemoglobin 11.9 on 4/1  Labs ordered for tomorrow  Continue to monitor 12.  Neurogenic bowel/bladder  Timed toileting  PVRs relatively unremarkable  Trial Enablex started on 4/5  13.  AKI  Creatinine 1.03 on 4/3  Labs ordered for tomorrow  Encourage fluids 14.  Hypotension  Blood pressure low on 4/5, however asymptomatic  LOS: 6 days A FACE TO FACE EVALUATION WAS PERFORMED  Charlett Blake 08/09/2018, 8:22 AM

## 2018-08-09 NOTE — Progress Notes (Signed)
Physical Therapy Session Note  Patient Details  Name: Susan Hodge MRN: [id number] Date of Birth: May 10, 1950  Today's Date: 08/09/2018 PT Individual Time: 0930-1000 PT Individual Time Calculation (min): 30 min   Short Term Goals: Week 2:  PT Short Term Goal 1 (Week 1): Pt will perform bed<>chair transfers with no more than mod assist PT Short Term Goal 2 (Week 1): Pt will ambulate at least 55ft using LRAD with no more than mod assist PT Short Term Goal 3 (Week 1): Pt will ascend/descend 4 steps using handrails with no more than mod assist  PT Short Term Goal 4 (Week 1): Pt will perform supine<>sit with no more than min assist  Skilled Therapeutic Interventions/Progress Updates:    Patient seated in w/c in room, agreeable to PT, requesting to toilet.  Assisted into bathroom in w/c, stand step to toilet with RW and mod A.  A for balance as standing to pull up pants.  Propelled w/c x about 100' prior to arm fatigue and noting w/c too wide for pt.  Obtained new w/c for pt 16x16 for better fit and pt transferred with CGA into new w/c.  Propelled in home environment with S and down the hall back to room with better tolerance due to better fit for shoulders.  S for w/c mobility >200'.  Left in w/c with alarm belt and needs/call bell in reach.  Therapy Documentation Precautions:  Precautions Precautions: Fall Precaution Comments: L LE numbness, weakness Restrictions Weight Bearing Restrictions: No RLE Weight Bearing: Weight bearing as tolerated Pain: Pain Assessment Faces Pain Scale: Hurts a little bit Pain Type: Acute pain Pain Location: Back Pain Onset: With Activity Pain Intervention(s): Repositioned    Therapy/Group: Individual Therapy  Reginia Naas  Peculiar, PT 08/09/2018, 1:30 PM

## 2018-08-10 ENCOUNTER — Inpatient Hospital Stay (HOSPITAL_COMMUNITY): Payer: Medicare HMO | Admitting: Occupational Therapy

## 2018-08-10 ENCOUNTER — Inpatient Hospital Stay (HOSPITAL_COMMUNITY): Payer: Self-pay

## 2018-08-10 ENCOUNTER — Inpatient Hospital Stay (HOSPITAL_COMMUNITY): Payer: Self-pay | Admitting: Physical Therapy

## 2018-08-10 NOTE — Progress Notes (Signed)
Occupational Therapy Weekly Progress Note  Patient Details  Name: Susan Hodge MRN: [id number] Date of Birth: 01-16-1951  Beginning of progress report period: August 04, 2018 End of progress report period: August 10, 2018  Today's Date: 08/10/2018 OT Individual Time: 5697-9480 OT Individual Time Calculation (min): 75 min    Susan Hodge continues to need min assist overall for functional transfers with use of the RW for support.  LB selfcare is also at a min assist level sit to stand.  RLE weakness is still prevalent at this time and limits her ability to advance the extremity with transfers and functional mobility.  She is currently trying out an AFO with PT and this is noted to assist some as well with toe clearing on the left, however she still tends to vault up on the RLE to advance the LLE secondary to the weakness.  Left knee buckling and hyperextension is also still present.  UB selfcare is at a supervision level in sitting however.  Feel she will need more CIR level therapies prior to discharge home with family and 24 hour supervision in order to reach the established supervision level goals.    Patient continues to demonstrate the following deficits: muscle weakness and decreased standing balance and decreased balance strategies and therefore will continue to benefit from skilled OT intervention to enhance overall performance with BADL, iADL and Reduce care partner burden.  Patient progressing toward long term goals..  Continue plan of care.  OT Short Term Goals Week 2:  OT Short Term Goal 1 (Week 2): Continue working on supervision level goals.  Skilled Therapeutic Interventions/Progress Updates:    Pt completed shower and dressing during session.  She transferred to the wheelchair with min assist stand pivot from the bed to start with use of the RW for support.  She then gathered clothing from wheelchair level prior to transfer into the shower seat.  Min assist for transfer to the  shower seat with decreased ability to advance the LLE and pt having to vault up on the RLE and attempt to swing the LLE forward.  Therapist providing min assist at times for proper placement.  She completed bathing sit to stand with min assist and then transferred to the wheelchair at mod assist level for dressing and grooming at the sink.  Supervision for all UB dressing with min assist for donning brief and pants sit to stand.  Supervision for right shoe with max assist for left shoe and AFO.  Finished session with pt using the RW for support and ambulating from the side of her bed to the bathroom door with the RW and left AFO.  She was able to complete with supervision level and compensatory strategies of decreased hip flexion and knee extension.  Pt left sitting up in the wheelchair with call button and phone in reach and safety alarm belt in place.    Therapy Documentation Precautions:  Precautions Precautions: Fall Precaution Comments: L LE numbness, weakness Restrictions Weight Bearing Restrictions: No RLE Weight Bearing: Weight bearing as tolerated   Pain: Pain Assessment Pain Scale: Faces Faces Pain Scale: Hurts even more("right much") Pain Type: Acute pain Pain Location: Back Pain Orientation: Lower Pain Descriptors / Indicators: Aching Pain Frequency: Intermittent Pain Intervention(s): Medication (See eMAR) ADL: See Care Tool for some details of ADL  Therapy/Group: Individual Therapy  Asmara Backs OTR/L 08/10/2018, 9:15 AM

## 2018-08-10 NOTE — Progress Notes (Signed)
Physical Therapy Session Note  Patient Details  Name: Susan Hodge MRN: [id number] Date of Birth: 23-Aug-1950  Today's Date: 08/10/2018 PT Individual Time: 1545-1700 PT Individual Time Calculation (min): 75 min   Short Term Goals: Week 1:  PT Short Term Goal 1 (Week 1): Pt will perform bed<>chair transfers with no more than mod assist PT Short Term Goal 2 (Week 1): Pt will ambulate at least 52ft using LRAD with no more than mod assist PT Short Term Goal 3 (Week 1): Pt will ascend/descend 4 steps using handrails with no more than mod assist  PT Short Term Goal 4 (Week 1): Pt will perform supine<>sit with no more than min assist  Skilled Therapeutic Interventions/Progress Updates:    Pt supine in bed upon PT arrival, agreeable to therapy tx and denies pain. Pt transferred to sitting EOB with supervision, donned shoes and AFO. Pt performed squat pivot to w/c with CGA and transported into bathroom. Pt performed w/c<>toilet stand pivot this session with min assist, min assist for standing balance while pt performed clothing management. Pt seating in w/c at sink washed hands. Pt propelled w/c x 200 ft with supervision to the gym. Squat pivot to mat CGA. Pt worked on standing balance and knee control this session. Pt performed x 5 mini squats in standing without UE support, emphasis on L knee control with therapist facilitating. Pt worked on pre-gait and L LE stance control to perform R LE stepping in place without UE support, verbal cues for postural control, mirror for visual feedback, min-mod assist. Pt worked on static standing balance without UE support- feet together and staggered stance- without UE support and CGA-min assist. Pt worked on dynamic standing balance while maintaining staggered stance with R LE forward to perform reaching task, min assist without UE support. Pt ambulated x 30 ft with RW, AFO and min-mod assist, pt with improved L LE stance control however difficulty initiating L swing  phase resulting in hip hiking/vaulting patterns. Pt transferred back to mat min assist. Pt standing with RW worked on hip flexor activation in L LE for swing phase in order to perform "kicking" in place to kick cones, 2 x 10 kicks with manual facilitation to hip flexors. Pt transported back to w/c and transported back to room. Pt transferred to bed with CGA sqaut pivot and changed into gown, transferred to supine and left with needs in reach and bed alarm set.   Therapy Documentation Precautions:  Precautions Precautions: Fall Precaution Comments: L LE numbness, weakness Restrictions Weight Bearing Restrictions: No RLE Weight Bearing: Weight bearing as tolerated   Therapy/Group: Individual Therapy  Netta Corrigan, PT, DPT 08/10/2018, 7:50 AM

## 2018-08-10 NOTE — Progress Notes (Signed)
Infection Prevention  Received call from: Dalda RN Unit:4W Regarding:Patient's son is on quarantine after exposure to a COVID + patient. Patient states she saw her son briefly on 4/1. Per nurse patient is not symptomatic and her son is not sick. IP Recommendation:Question patient about any contact with the + patient herself in the last 14 days, discuss with provider and monitor closely for respiratory symptoms. Call infection prevention with any questions.

## 2018-08-10 NOTE — Progress Notes (Signed)
Physical Therapy Session Note  Patient Details  Name: Susan Hodge MRN: [id number] Date of Birth: 03-19-1951  Today's Date: 08/10/2018 PT Individual Time: 3329-5188 PT Individual Time Calculation (min): 57 min   Short Term Goals: Week 1:  PT Short Term Goal 1 (Week 1): Pt will perform bed<>chair transfers with no more than mod assist PT Short Term Goal 2 (Week 1): Pt will ambulate at least 91ft using LRAD with no more than mod assist PT Short Term Goal 3 (Week 1): Pt will ascend/descend 4 steps using handrails with no more than mod assist  PT Short Term Goal 4 (Week 1): Pt will perform supine<>sit with no more than min assist  Skilled Therapeutic Interventions/Progress Updates:   Pt received sitting in w/c and agreeable to therapy session. Pt request to use bathroom. Pt transported to/from bathroom in w/c and performed stand pivot transfer w/c<>BSC over toilet using grab bars with primarily CGA for steadying and 1 instance of min A for balance and L LE management. Pt able to perform standing LB clothing management using UE support on grab bars with CGA for steadying. Pt continent of urine and performed seated pericare with supervision for safety. Pt wearing L LE AFO during therapy session to provide increased knee extension control during standing and ambulation. Pt transported to/from therapy gym in w/c. Pt ambulated 31ft x 2 using RW, seated break between, with mod assist for L LE management and trunk control. Progressed to ambulating 2ft x 2 without AD with mod/max assist for L LE management and trunk control - therapist provided manual facilitation for L LE placement during swing phase and  cuing for sequencing of L LE knee extension and weight shifting prior to R LE swing - manual facilitation provided for L weight shifting during stance phase and occasional min/mod assist for L LE knee control due to buckling. Pt continued to demonstrate decreased L weight shift during stance phase despite  max cuing and manual facilitation - returned to pre-gait training with emphasis on L weight shift with manual facilitation for proper timing while stepping forward/backwards with R LE. Pt achieve tall kneeling on mat with max assist to get onto mat table for trunk control and L LE management. Performed tall kneeling focusing on gluteal activation and trunk control of maintaining upright without UE support - pt able to maintain midline for ~10 second with close supervision then required max assist upon anterior or posterior LOB with pt using B UEs to support herself as well. Performed repeated lower and lift in tall kneeling focusing on gluteal activation and core trunk control x10 with tactile cuing for L glute activation and mod assist for trunk control. Pt performed squat pivot transfer EOM to w/c and returned to room in w/c. Pt left seated in w/c with needs in reach and chair alarm on.    Therapy Documentation Precautions:  Precautions Precautions: Fall Precaution Comments: L LE numbness, weakness Restrictions Weight Bearing Restrictions: No RLE Weight Bearing: Weight bearing as tolerated  Pain: Reports no pain during therapy session and states pain medication administered this AM.    Therapy/Group: Individual Therapy  Tawana Scale, PT, DPT  08/10/2018, 12:09 PM

## 2018-08-10 NOTE — Progress Notes (Signed)
08/09/2018 2137: Assigned RN is administering scheduled and PRN medications to pt. Pt informs RN that immediate family members are currently quarantined due to [place]. Pt states, "My sons and their spouses are quarantined because of [place]. My ex-husband is currently in ICU and his wife is in the hospital too." RN asks pt has she been in direct contact with any of the aforementioned and the pt states that she was in contact with her middle son and middle son's wife who brought her to the hospital on 3/28. RN asks the pt is this the last time that she was in direct contact with hr son and pt states, "No, I saw him on 4/1 but he was standing at a distance." RN informs charge nurse of discussion. Charge nurse contacts AC and infection control. Charge nurse directs assigned RN to monitor pt for elevated temperature, cough, and RR/SOB. All vitals are WDL. Pt is asymptomatic with call bell at side. RN will continue to monitor.

## 2018-08-10 NOTE — Progress Notes (Signed)
[facility] PHYSICAL MEDICINE & REHABILITATION PROGRESS NOTE  Subjective/Complaints:  Discussed in detail her connection to ex husband that tested + for Covid 19 ~4/4 No contact within 2wk Her middle son had contact with ex husband within the last 2 wks but not with her Her middle son had contact with younger son within the last 2 wks prior to younger son having contact with pt THe younger son had contact with pt while she was admitted but did not come within 6 ft of her No URI symptoms in pt or her sone (who are currently quarantined) ROS: Denies CP, shortness of breath, nausea, vomiting, diarrhea.  Objective: Vital Signs: Blood pressure 95/88, pulse 85, temperature 97.8 F (36.6 C), temperature source Oral, resp. rate 17, height 5' (1.524 m), SpO2 100 %. No results found. Recent Labs    08/09/18 1015  WBC 9.6  HGB 11.8*  HCT 35.1*  PLT 154   Recent Labs    08/09/18 1015  NA 134*  K 3.9  CL 99  CO2 27  GLUCOSE 100*  BUN 16  CREATININE 0.77  CALCIUM 8.4*    Physical Exam: BP 95/88 (BP Location: Right Arm)   Pulse 85   Temp 97.8 F (36.6 C) (Oral)   Resp 17   Ht 5' (1.524 m)   SpO2 100%   BMI 24.58 kg/m  Gen: NAD.  Vital signs reviewed. HENT: Normocephalic.  Atraumatic. Eyes: EOMI. No discharge.  Cardio: No JVD. Pulm: Effort normal, lungs clear no wheezes or rales Neuro: Alert and oriented Musc: No edema or tenderness in lower extremities. Neurological: She isalertand oriented Motor:  Right lower extremity: 4-4+/5 proximal distal, unchanged Left lower extremity: Hip flexion, knee extension 2+/5, ankle dorsiflexion 1/5, unchanged Skin: Warm and dry.  Intact. Psychiatric: Normal mood.  Normal affect.  Assessment/Plan: 1. Functional deficits secondary to C3-4, C7 myelitis which require 3+ hours per day of interdisciplinary therapy in a comprehensive inpatient rehab setting.  Physiatrist is providing close team supervision and 24 hour management of active  medical problems listed below.  Physiatrist and rehab team continue to assess barriers to discharge/monitor patient progress toward functional and medical goals  Care Tool:  Bathing    Body parts bathed by patient: Right arm, Chest, Left arm, Right upper leg, Left upper leg, Right lower leg, Abdomen, Front perineal area, Left lower leg, Face, Buttocks     Body parts n/a: Front perineal area   Bathing assist Assist Level: Minimal Assistance - Patient > 75%     Upper Body Dressing/Undressing Upper body dressing   What is the patient wearing?: Pull over shirt, Bra    Upper body assist Assist Level: Set up assist    Lower Body Dressing/Undressing Lower body dressing      What is the patient wearing?: Pants, Incontinence brief     Lower body assist Assist for lower body dressing: Minimal Assistance - Patient > 75%     Toileting Toileting    Toileting assist Assist for toileting: Moderate Assistance - Patient 50 - 74%     Transfers Chair/bed transfer  Transfers assist     Chair/bed transfer assist level: Minimal Assistance - Patient > 75%     Locomotion Ambulation   Ambulation assist      Assist level: Moderate Assistance - Patient 50 - 74% Assistive device: Walker-rolling Max distance: 30 ft   Walk 10 feet activity   Assist     Assist level: Moderate Assistance - Patient - 50 - 74%  Assistive device: Walker-rolling   Walk 50 feet activity   Assist Walk 50 feet with 2 turns activity did not occur: Safety/medical concerns         Walk 150 feet activity   Assist Walk 150 feet activity did not occur: Safety/medical concerns         Walk 10 feet on uneven surface  activity   Assist Walk 10 feet on uneven surfaces activity did not occur: Safety/medical concerns         Wheelchair     Assist Will patient use wheelchair at discharge?: Yes Type of Wheelchair: Manual    Wheelchair assist level: Supervision/Verbal cueing Max  wheelchair distance: 200    Wheelchair 50 feet with 2 turns activity    Assist        Assist Level: Supervision/Verbal cueing   Wheelchair 150 feet activity     Assist Wheelchair 150 feet activity did not occur: Safety/medical concerns   Assist Level: Supervision/Verbal cueing      Medical Problem List and Plan: 1.Left side weakness and hemisensory deficitssecondary to C3-4, C7 myelitis  IV Solu-Medrolcompleted on 08/04/2018.  Continue CIR, Team conference today please see physician documentation under team conference tab, met with team face-to-face to discuss problems,progress, and goals. Formulized individual treatment plan based on medical history, underlying problem and comorbidities.  PRAFO ordered 2. Antithrombotics: DVT/anticoagulation:Acute DVT left gastrocnemius vein.Transitioned from Lovenox to Eliquis Antiplatelet therapy: N/A 3. Pain Management/fibromyalgia/migraine headaches:Valproic acid 250 mg twice a day Lyrica 50 mg 3 times a day,oxycodone as needed  Relatively controlled on 4/7   4. Mood:Xanax 0.5 mg 3 times a day as needed  Severe anxiety at present, will consider Lexapro pending response to therapies- anxiety improving -antipsychotic agents: N/A 5. Neuropsych: This patientiscapable of making decisions on herown behalf. 6. Skin/Wound Care:Routine skin checks 7. Fluids/Electrolytes/Nutrition:Routine in and out's  8. GERD. Protonix 9. Tobacco abuse. Counseling 10.  Hypoalbuminemia  Supplement initiated on 4/1 11.  Acute blood loss anemia  Hemoglobin 11.9 on 4/1, stable 11.8 on 4/6    Continue to monitor 12.  Neurogenic bowel/bladder  Timed toileting  PVRs relatively unremarkable  Trial Enablex started on 4/5  13.  AKI  resolved 14.  Hypotension  Blood pressure low on 4/5, however asymptomatic 15. 3rd generation contact with Covid pt but not within 6 ft.  Will monitor for sx but not high risk based on  exposure hx LOS: 7 days A FACE TO FACE EVALUATION WAS PERFORMED  Charlett Blake 08/10/2018, 7:37 AM

## 2018-08-11 ENCOUNTER — Inpatient Hospital Stay (HOSPITAL_COMMUNITY): Payer: Medicare HMO | Admitting: Occupational Therapy

## 2018-08-11 ENCOUNTER — Inpatient Hospital Stay (HOSPITAL_COMMUNITY): Payer: Self-pay

## 2018-08-11 NOTE — Progress Notes (Signed)
Social Work Patient ID: Susan Hodge, female   DOB: [birth year], 68 y.o.   MRN: [id number] Met with pt and spoke with daughter in-law via telephone to discuss team conference goals supervision-min assist level and discharge 4/15. Both aware 24 hour care is recommended at discharge. Pt wants a ramp and rail for home. Daughter in-law aware and will have family work on this. Discussed family education for Monday 1:00-3:00 pm with her. Will work on discharge needs for next Wed.

## 2018-08-11 NOTE — Progress Notes (Signed)
Physical Therapy Weekly Progress Note  Patient Details  Name: Susan Hodge MRN: [id number] Date of Birth: [birth year]  Beginning of progress report period: August 04, 2018 End of progress report period: August 11, 2018  Today's Date: 08/11/2018 PT Individual Time: 7209-4709 and 6283-6629 PT Individual Time Calculation (min): 59 min and 68 min   Patient has met 4 of 4 short term goals.  Pt is progressing well with all functional mobility. Pt demonstrates improved knee control with L LE however continues to use compensatory patterns when ambulating. Pt requires min assist overall for gait, transfers and standing balance.   Patient continues to demonstrate the following deficits muscle weakness, impaired timing and sequencing, unbalanced muscle activation and decreased coordination and decreased standing balance, decreased postural control and decreased balance strategies and therefore will continue to benefit from skilled PT intervention to increase functional independence with mobility.  Patient progressing toward long term goals..  Continue plan of care.  PT Short Term Goals Week 1:  PT Short Term Goal 1 (Week 1): Pt will perform bed<>chair transfers with no more than mod assist PT Short Term Goal 1 - Progress (Week 1): Met PT Short Term Goal 2 (Week 1): Pt will ambulate at least 72f using LRAD with no more than mod assist PT Short Term Goal 2 - Progress (Week 1): Met PT Short Term Goal 3 (Week 1): Pt will ascend/descend 4 steps using handrails with no more than mod assist  PT Short Term Goal 3 - Progress (Week 1): Met PT Short Term Goal 4 (Week 1): Pt will perform supine<>sit with no more than min assist PT Short Term Goal 4 - Progress (Week 1): Met Week 2:  PT Short Term Goal 1 (Week 2): STG=LTG due to ELOS  Skilled Therapeutic Interventions/Progress Updates:  Ambulation/gait training;Discharge planning;DME/adaptive equipment instruction;Functional mobility training;Pain  management;Psychosocial support;Splinting/orthotics;Therapeutic Activities;UE/LE Strength taining/ROM;Balance/vestibular training;Community reintegration;Disease management/prevention;Functional electrical stimulation;Neuromuscular re-education;Patient/family education;Skin care/wound management;Stair training;Therapeutic Exercise;UE/LE Coordination activities;Wheelchair propulsion/positioning   Session 1: Pt seated in w/c upon PT arrival, agreeable to therapy tx and denies pain. Pt propelled into bathroom and performed stand pivot to toilet with min assist, min assist for standing balance with clothing management. Pt propelled to the gym in w/c with supervision x 200 ft. Pt ascended/descended 4 steps this session with B handrails, L AFO, min assist and step to pattern, cues for techniques. Pt transferred to mat CGA sqaut pivot. Pt transferred to sidelying for gravity elimintated hip flexor neuro re-ed using powder board, 2 x 10 AAROM. Pt unable to activate hamstrings. Pt transferred to standing and worked on L hip flexor activation in standing for swing phase practice. Pt ambulated x 50 ft this session with RW and min assist, L AFO and RW, cues to minimize vaulting/hip hiking compensatory patterns. Pt propelled back to room and transferred to recliner, left in recliner with needs in reach and chair alarm st.   Session 2:  Pt seated in w/c upon PT arrival, agreeable to therapy tx and denies pain. Pt propelled to gym in w/c with supervision. Pt ambulated x 30 ft with RW and min assist, continues to demo vaulting/hiking to clear L LE during swing, stance control improved. Pt transferred to mat. Pt worked on sit<>stands x 10 and mini squats 2 x 10 without L AFO on to focus on L knee control, min assist and mirror for visual feedback. Pt worked on static standing balance without UE support, min guard assist with emphasis on trying to maintain "balance point" and  also keeping L knee quads engaged. Pt performed  stand pivot to w/c CGA and transported to dayroom. Pt performed sit<>stand with RW and min assist while donning litegait harness for gait training. Pt ambulated x 3 minutes, 0.4 mph with therapist facilitating L hip and knee flexion swing. Pt with increased fatigue this afternoon, requesting to go back to room. Pt transferred to bed with CGA. Pt donned clothes and donned gown with supervision, transferred to supine with supervision. Left with needs in reach and bed alarm set.    Therapy Documentation Precautions:  Precautions Precautions: Fall Precaution Comments: L LE numbness, weakness Restrictions Weight Bearing Restrictions: No RLE Weight Bearing: Weight bearing as tolerated  Therapy/Group: Individual Therapy  Netta Corrigan, PT, DPT 08/11/2018, 7:56 AM

## 2018-08-11 NOTE — Patient Care Conference (Signed)
Inpatient RehabilitationTeam Conference and Plan of Care Update Date: 08/11/2018   Time: 11:15 AM    Patient Name: Susan Hodge      Medical Record Number: [id number]  Date of Birth: 12/06/1950 Sex: Female         Room/Bed: 4W05C/4W05C-01 Payor Info: Payor: [employer] MEDICARE / Plan: [place] MEDICARE HMO/PPO / Product Type: *No Product type* /    Admitting Diagnosis: myelitis  Admit Date/Time:  08/03/2018  4:43 PM Admission Comments: No comment available   Primary Diagnosis:  <principal problem not specified> Principal Problem: <principal problem not specified>  Patient Active Problem List   Diagnosis Date Noted  . AKI (acute kidney injury) ([place])   . Neurogenic bowel   . Neurogenic bladder   . Anxiety state   . Acute blood loss anemia   . Hypoalbuminemia due to protein-calorie malnutrition ([place])   . Elevated BUN   . Postoperative pain   . Chronic pain syndrome   . Myelitis ([place]) 07/31/2018  . Leukocytosis 07/31/2018  . Anxiety 07/31/2018  . GERD (gastroesophageal reflux disease) 07/31/2018  . ANXIETY DISORDER, GENERALIZED 07/23/2009  . UNSPECIFIED HYPOTHYROIDISM 06/21/2009  . NONDEPENDENT TOBACCO USE DISORDER 06/21/2009  . DEPRESSIVE DISORDER NOT ELSEWHERE CLASSIFIED 06/21/2009  . INTERMEDIATE CORONARY SYNDROME 06/21/2009  . LUPUS NEPHRITIS 06/21/2009  . UNSPECIFIED MYALGIA AND MYOSITIS 06/21/2009  . PRECORDIAL PAIN 06/21/2009    Expected Discharge Date: Expected Discharge Date: 08/18/18  Team Members Present: Physician leading conference: Dr. Alysia Penna Social Worker Present: Ovidio Kin, LCSW Nurse Present: Isla Pence, RN PT Present: Michaelene Song, PT OT Present: Clyda Greener, OT SLP Present: Charolett Bumpers, SLP PPS Coordinator present : Gunnar Fusi     Current Status/Progress Goal Weekly Team Focus  Medical   Left foot drop, no cognitive issues, BPs running low but asymptomatic  maintain normotensive range of BP, manage anxiety and pain  Work on D/C  planning   Bowel/Bladder   Pt is contninet of B/B. LBM 08/08/2018.  Maintain regular bowel pattern.   Assist with toileting needs PRN.   Swallow/Nutrition/ Hydration             ADL's   supervision for UB bathing and dressing, min assist for LB bathing and dressing.  Min to mod assist for transfers using the RW to the toilet and walk-in shower  supervision overall  selfcare retraining, balance retraining, transfer training, DME education, pt/family education, therapeutic activity   Mobility   CGA squat pivot transfers, min assist sit<>stand transfer, mod/max assist ambulating 10 ft without AD  supervision bed mobility, CGA transfers, and min assist ambulating with LRAD  L LE neuromuscular re-education, pre-gait and gait trianing, trunk control, standing balance   Communication             Safety/Cognition/ Behavioral Observations            Pain   Pt complains of pain of 7 to lower back. Pain managed with Percocet.  Pain < 3  Assess pain Q shift and PRN.   Skin   Pt has ecchymosis to the R arm.  Treat skin issues per orders.  Assess skin Q shift and PRN.      *See Care Plan and progress notes for long and short-term goals.     Barriers to Discharge  Current Status/Progress Possible Resolutions Date Resolved   Physician    Medical stability     progressing with therapy  Adjust BP meds      Nursing  PT                    OT                  SLP                SW                Discharge Planning/Teaching Needs:  HOme with family members coming in to assist, made aware to pt and daughter in-law she will need 24 hr physical care at DC      Team Discussion:  Making good progress in therapies-currently min-mod level of assist. Goals supervision-min level. Pain managed and labs stable. Knee instability ordering a AFO for this. Need family education prior to DC home.   Revisions to Treatment Plan:  DC 4/15    Continued Need for Acute Rehabilitation Level of  Care: The patient requires daily medical management by a physician with specialized training in physical medicine and rehabilitation for the following conditions: Daily direction of a multidisciplinary physical rehabilitation program to ensure safe treatment while eliciting the highest outcome that is of practical value to the patient.: Yes Daily medical management of patient stability for increased activity during participation in an intensive rehabilitation regime.: Yes Daily analysis of laboratory values and/or radiology reports with any subsequent need for medication adjustment of medical intervention for : Neurological problems;Mood/behavior problems   I attest that I was present, lead the team conference, and concur with the assessment and plan of the team. Teleconference held due to COVID-19   [name] 08/11/2018, 2:33 PM

## 2018-08-11 NOTE — Progress Notes (Signed)
[facility] PHYSICAL MEDICINE & REHABILITATION PROGRESS NOTE  Subjective/Complaints:  Pt without cough no chills or sweats ROS: Denies CP, shortness of breath, nausea, vomiting, diarrhea.  Objective: Vital Signs: Blood pressure (!) 103/47, pulse 66, temperature 98.6 F (37 C), temperature source Oral, resp. rate 18, height 5' (1.524 m), SpO2 100 %. No results found. Recent Labs    08/09/18 1015  WBC 9.6  HGB 11.8*  HCT 35.1*  PLT 154   Recent Labs    08/09/18 1015  NA 134*  K 3.9  CL 99  CO2 27  GLUCOSE 100*  BUN 16  CREATININE 0.77  CALCIUM 8.4*    Physical Exam: BP (!) 103/47 (BP Location: Left Arm)   Pulse 66   Temp 98.6 F (37 C) (Oral)   Resp 18   Ht 5' (1.524 m)   SpO2 100%   BMI 24.58 kg/m  Gen: NAD.  Vital signs reviewed. HENT: Normocephalic.  Atraumatic. Eyes: EOMI. No discharge.  Cardio: No JVD. Pulm: Effort normal, lungs clear no wheezes or rales Neuro: Alert and oriented Musc: No edema or tenderness in lower extremities. Neurological: She isalertand oriented Motor:  Right lower extremity: 4-4+/5 proximal distal, unchanged Left lower extremity: Hip flexion, knee extension 2+/5, ankle dorsiflexion 1/5, unchanged Skin: Warm and dry.  Intact. Psychiatric: Normal mood.  Normal affect.  Assessment/Plan: 1. Functional deficits secondary to C3-4, C7 myelitis which require 3+ hours per day of interdisciplinary therapy in a comprehensive inpatient rehab setting.  Physiatrist is providing close team supervision and 24 hour management of active medical problems listed below.  Physiatrist and rehab team continue to assess barriers to discharge/monitor patient progress toward functional and medical goals  Care Tool:  Bathing    Body parts bathed by patient: Right arm, Chest, Left arm, Right upper leg, Left upper leg, Right lower leg, Abdomen, Front perineal area, Left lower leg, Face, Buttocks     Body parts n/a: Front perineal area   Bathing  assist Assist Level: Minimal Assistance - Patient > 75%     Upper Body Dressing/Undressing Upper body dressing   What is the patient wearing?: Pull over shirt, Bra    Upper body assist Assist Level: Set up assist    Lower Body Dressing/Undressing Lower body dressing      What is the patient wearing?: Pants, Incontinence brief     Lower body assist Assist for lower body dressing: Minimal Assistance - Patient > 75%     Toileting Toileting    Toileting assist Assist for toileting: Moderate Assistance - Patient 50 - 74%     Transfers Chair/bed transfer  Transfers assist     Chair/bed transfer assist level: Contact Guard/Touching assist(squat pivot)     Locomotion Ambulation   Ambulation assist      Assist level: Maximal Assistance - Patient 25 - 49% Assistive device: Other (comment)(none) Max distance: 73f   Walk 10 feet activity   Assist     Assist level: Maximal Assistance - Patient 25 - 49% Assistive device: Other (comment)(none)   Walk 50 feet activity   Assist Walk 50 feet with 2 turns activity did not occur: Safety/medical concerns         Walk 150 feet activity   Assist Walk 150 feet activity did not occur: Safety/medical concerns         Walk 10 feet on uneven surface  activity   Assist Walk 10 feet on uneven surfaces activity did not occur: Safety/medical concerns  Wheelchair     Assist Will patient use wheelchair at discharge?: Yes Type of Wheelchair: Manual    Wheelchair assist level: Supervision/Verbal cueing Max wheelchair distance: 200    Wheelchair 50 feet with 2 turns activity    Assist        Assist Level: Supervision/Verbal cueing   Wheelchair 150 feet activity     Assist Wheelchair 150 feet activity did not occur: Safety/medical concerns   Assist Level: Supervision/Verbal cueing      Medical Problem List and Plan: 1.Left side weakness and hemisensory deficitssecondary to  C3-4, C7 myelitis  IV Solu-Medrolcompleted on 08/04/2018.  Continue CIR, Team conference today please see physician documentation under team conference tab, met with team face-to-face to discuss problems,progress, and goals. Formulized individual treatment plan based on medical history, underlying problem and comorbidities.  PRAFO ordered 2. Antithrombotics: DVT/anticoagulation:Acute DVT left gastrocnemius vein.Transitioned from Lovenox to Eliquis Antiplatelet therapy: N/A 3. Pain Management/fibromyalgia/migraine headaches:Valproic acid 250 mg twice a day Lyrica 50 mg 3 times a day,oxycodone as needed  Relatively controlled on 4/8   4. Mood:Xanax 0.5 mg 3 times a day as needed  Severe anxiety at present, will consider Lexapro pending response to therapies- anxiety improving -antipsychotic agents: N/A 5. Neuropsych: This patientiscapable of making decisions on herown behalf. 6. Skin/Wound Care:Routine skin checks 7. Fluids/Electrolytes/Nutrition:Routine in and out's  8. GERD. Protonix 9. Tobacco abuse. Counseling 10.  Hypoalbuminemia  Supplement initiated on 4/1 11.  Acute blood loss anemia  Hemoglobin 11.9 on 4/1, stable 11.8 on 4/6    Continue to monitor 12.  Neurogenic bowel/bladder  Timed toileting  PVRs relatively unremarkable  Trial Enablex started on 4/5  13.  AKI  resolved 14.  Hypotension  Blood pressure low on 4/5, however asymptomatic 15. 3rd generation contact with Covid pt but not within 6 ft.  Will monitor for sx but not high risk based on exposure hx LOS: 8 days A FACE TO FACE EVALUATION WAS PERFORMED  Charlett Blake 08/11/2018, 8:38 AM

## 2018-08-11 NOTE — Progress Notes (Signed)
Occupational Therapy Session Note  Patient Details  Name: Susan Hodge MRN: [id number] Date of Birth: [birth year]  Today's Date: 08/11/2018 OT Individual Time: 0901-1002 OT Individual Time Calculation (min): 61 min    Short Term Goals: Week 2:  OT Short Term Goal 1 (Week 2): Continue working on supervision level goals.  Skilled Therapeutic Interventions/Progress Updates:    Pt completed shower and dressing sit to stand this session.  She was able to ambulate to the bathroom with use of the RW from the bed with min assist.  She needed min assist for advancement of the LLE with right foot vaulting noted.  Mod instructional cueing to try and avoid this with each step.  She was able to complete all UB bathing and dressing with setup and LB selfcare with min assist.  She needed max assist for donning AFO on the left foot but was able to donn her TEDs and tie her shoe.  She completed oral hygiene and brushing hair at the end of the session with setup only.  Session finished with call button and phone in reach and safety alarm belt in place.    Therapy Documentation Precautions:  Precautions Precautions: Fall Precaution Comments: L LE numbness, weakness Restrictions Weight Bearing Restrictions: No RLE Weight Bearing: Weight bearing as tolerated Pain: Pain Assessment Pain Scale: 0-10 Pain Score: 0-No pain Pain Type: Chronic pain Pain Location: Back Pain Orientation: Lower Pain Radiating Towards: hips and legs Pain Descriptors / Indicators: Burning;Throbbing Pain Frequency: Constant Pain Intervention(s): Medication (See eMAR) ADL: See Care Tool Section for some details of ADL  Therapy/Group: Individual Therapy  Ladarrious Kirksey OTR/L 08/11/2018, 11:05 AM

## 2018-08-11 NOTE — Progress Notes (Signed)
Occupational Therapy Session Note  Patient Details  Name: Susan Hodge MRN: [id number] Date of Birth: 10/14/1950  Today's Date: 08/11/2018 OT Individual Time: 1415-1455 OT Individual Time Calculation (min): 40 min    Short Term Goals: Week 2:  OT Short Term Goal 1 (Week 2): Continue working on supervision level goals.  Skilled Therapeutic Interventions/Progress Updates:    Pt seen for OT session focusing on neuro re-ed and functional standing balance/endurance during table top task. Pt sitting up in w/c upon arrival, agreeable to tx session and denying pain.  She self propelled w/c throughout unit mod I. Completed standing towel folding task at high/low table. Pt able to maintain dynamic standing balance without UE support in order to fold towels with CGA and assist for stabilization of L knee. Following seated rest break, completed x2 trials of standing towel folding with R LE placed up on block to facilitate WBing through L LE. Required same stabilization assist as noted above.  During rest breaks, spoke extensively regarding pt's home occupations and activities. Pt reports much of her daily routine revolves around smoking. Education provided regarding smoking cessation and health/healing benefits. Discussed meaningful leisure pursuits and new routines. Education also provided regarding energy conservation and safety for d/c. Pt returned to room at end of session. Requested to stay sitting up in w/c. Left with all needs in reach and NT present.   Therapy Documentation Precautions:  Precautions Precautions: Fall Precaution Comments: L LE numbness, weakness Restrictions Weight Bearing Restrictions: No RLE Weight Bearing: Weight bearing as tolerated   Therapy/Group: Individual Therapy  [name] L 08/11/2018, 7:07 AM

## 2018-08-12 ENCOUNTER — Inpatient Hospital Stay (HOSPITAL_COMMUNITY): Payer: Medicare HMO | Admitting: Occupational Therapy

## 2018-08-12 ENCOUNTER — Inpatient Hospital Stay (HOSPITAL_COMMUNITY): Payer: Self-pay | Admitting: Occupational Therapy

## 2018-08-12 ENCOUNTER — Inpatient Hospital Stay (HOSPITAL_COMMUNITY): Payer: Self-pay | Admitting: Physical Therapy

## 2018-08-12 MED ORDER — VALPROIC ACID 250 MG PO CAPS
250.0000 mg | ORAL_CAPSULE | Freq: Every day | ORAL | Status: DC
  Administered 2018-08-13 – 2018-08-17 (×5): 250 mg via ORAL
  Filled 2018-08-12 (×5): qty 1

## 2018-08-12 NOTE — Progress Notes (Signed)
Occupational Therapy Session Note  Patient Details  Name: Susan Hodge MRN: [id number] Date of Birth: 25-Aug-1950  Today's Date: 08/12/2018 OT Individual Time: 1448-1530 OT Individual Time Calculation (min): 42 min    Short Term Goals: Week 2:  OT Short Term Goal 1 (Week 2): Continue working on supervision level goals.  Skilled Therapeutic Interventions/Progress Updates:    Pt completed wheelchair mobility from the gym down to the tub shower room with supervision.  Once in the tub room she was able to complete stand pivot transfer to the tub bench with use of the RW and min assist.  Decreased ability to adequately advance the LLE was noted however with pt continuing to vault on the right to help with moving it.  She was able to self manage her LEs over the edge of the tub when transferring into and out of the shower.  Min assist for transfer back to the wheelchair from the tub bench.  Next had pt work on [place] and endurance with use of the UE ergonometer.  She completed 1 set of 10 mins with resistance set on level 7 and RPMs maintained at 25 on [employer] setting.  Finished session with therapist assistance to return to the room and pt left up in the wheelchair per her request with call button and phone in reach and safety alarm belt in place.    Therapy Documentation Precautions:  Precautions Precautions: Fall Precaution Comments: L LE numbness, weakness Restrictions Weight Bearing Restrictions: No RLE Weight Bearing: Weight bearing as tolerated  Pain: Pain Assessment Pain Scale: Faces Pain Score: 0-No pain    Therapy/Group: Individual Therapy  Elvis Laufer OTR/L 08/12/2018, 3:51 PM

## 2018-08-12 NOTE — Progress Notes (Signed)
[facility] PHYSICAL MEDICINE & REHABILITATION PROGRESS NOTE  Subjective/Complaints:  No issues overnte , worked very hard in therapy yesterday and was exhausted ROS: Denies CP, shortness of breath, nausea, vomiting, diarrhea.  Objective: Vital Signs: Blood pressure (!) 91/49, pulse 70, temperature 98.3 F (36.8 C), temperature source Oral, resp. rate 16, height 5' (1.524 m), SpO2 98 %. No results found. Recent Labs    08/09/18 1015  WBC 9.6  HGB 11.8*  HCT 35.1*  PLT 154   Recent Labs    08/09/18 1015  NA 134*  K 3.9  CL 99  CO2 27  GLUCOSE 100*  BUN 16  CREATININE 0.77  CALCIUM 8.4*    Physical Exam: BP (!) 91/49 (BP Location: Left Arm)   Pulse 70   Temp 98.3 F (36.8 C) (Oral)   Resp 16   Ht 5' (1.524 m)   SpO2 98%   BMI 24.58 kg/m  Gen: NAD.  Vital signs reviewed. HENT: Normocephalic.  Atraumatic. Eyes: EOMI. No discharge.  Cardio: No JVD. Pulm: Effort normal, lungs clear no wheezes or rales Neuro: Alert and oriented Musc: No edema or tenderness in lower extremities. Neurological: She isalertand oriented Motor:  Right lower extremity: 4-4+/5 proximal distal, unchanged Left lower extremity: Hip flexion, knee extension 2+/5, ankle dorsiflexion 1/5, unchanged Skin: Warm and dry.  Intact. Psychiatric: Normal mood.  Normal affect.  Assessment/Plan: 1. Functional deficits secondary to C3-4, C7 myelitis which require 3+ hours per day of interdisciplinary therapy in a comprehensive inpatient rehab setting.  Physiatrist is providing close team supervision and 24 hour management of active medical problems listed below.  Physiatrist and rehab team continue to assess barriers to discharge/monitor patient progress toward functional and medical goals  Care Tool:  Bathing    Body parts bathed by patient: Right arm, Chest, Left arm, Right upper leg, Left upper leg, Right lower leg, Abdomen, Front perineal area, Left lower leg, Face, Buttocks     Body parts  n/a: Front perineal area   Bathing assist Assist Level: Minimal Assistance - Patient > 75%     Upper Body Dressing/Undressing Upper body dressing   What is the patient wearing?: Pull over shirt, Bra    Upper body assist Assist Level: Set up assist    Lower Body Dressing/Undressing Lower body dressing      What is the patient wearing?: Incontinence brief, Underwear/pull up     Lower body assist Assist for lower body dressing: Minimal Assistance - Patient > 75%     Toileting Toileting    Toileting assist Assist for toileting: Minimal Assistance - Patient > 75%     Transfers Chair/bed transfer  Transfers assist     Chair/bed transfer assist level: Contact Guard/Touching assist     Locomotion Ambulation   Ambulation assist      Assist level: Minimal Assistance - Patient > 75% Assistive device: Walker-rolling Max distance: 50 ft   Walk 10 feet activity   Assist     Assist level: Minimal Assistance - Patient > 75% Assistive device: Walker-rolling   Walk 50 feet activity   Assist Walk 50 feet with 2 turns activity did not occur: Safety/medical concerns  Assist level: Minimal Assistance - Patient > 75% Assistive device: Walker-rolling    Walk 150 feet activity   Assist Walk 150 feet activity did not occur: Safety/medical concerns         Walk 10 feet on uneven surface  activity   Assist Walk 10 feet on uneven surfaces  activity did not occur: Safety/medical concerns         Wheelchair     Assist Will patient use wheelchair at discharge?: Yes Type of Wheelchair: Manual    Wheelchair assist level: Supervision/Verbal cueing Max wheelchair distance: 200    Wheelchair 50 feet with 2 turns activity    Assist        Assist Level: Supervision/Verbal cueing   Wheelchair 150 feet activity     Assist Wheelchair 150 feet activity did not occur: Safety/medical concerns   Assist Level: Supervision/Verbal cueing       Medical Problem List and Plan: 1.Left side weakness and hemisensory deficitssecondary to C3-4, C7 myelitis    Continue CIR, PT, OT, ELOS 4/15 PRAFO ordered 2. Antithrombotics: DVT/anticoagulation:Acute DVT left gastrocnemius vein.Transitioned from Lovenox to Eliquis Antiplatelet therapy: N/A 3. Pain Management/fibromyalgia/migraine headaches:Valproic acid 250 mg twice a day Lyrica 50 mg 3 times a day,oxycodone as needed  Relatively controlled on 4/9   4. Mood:Xanax 0.5 mg 3 times a day as needed  Severe anxiety at present, will consider Lexapro pending response to therapies- anxiety improving -antipsychotic agents: N/A 5. Neuropsych: This patientiscapable of making decisions on herown behalf. 6. Skin/Wound Care:Routine skin checks 7. Fluids/Electrolytes/Nutrition:Routine in and out's  8. GERD. Protonix 9. Tobacco abuse. Counseling 10.  Hypoalbuminemia  Supplement initiated on 4/1 11.  Acute blood loss anemia- no obvious blood loss, on Eliquis  Hemoglobin 11.9 on 4/1, stable 11.8 on 4/6    Continue to monitor 12.  Neurogenic bowel/bladder  Timed toileting  PVRs relatively unremarkable  Trial Enablex started on 4/5   14.  Hypotension asymptomatic- low normal  No antihypertensive meds 15. 3rd generation contact with Covid pt but not within 6 ft.  Will monitor for sx but not high risk based on exposure hx LOS: 9 days A FACE TO FACE EVALUATION WAS PERFORMED  Charlett Blake 08/12/2018, 7:32 AM

## 2018-08-12 NOTE — Progress Notes (Signed)
Physical Therapy Session Note  Patient Details  Name: Susan Hodge MRN: [id number] Date of Birth: 1950/08/16  Today's Date: 08/12/2018 PT Individual Time: 4098-1191 PT Individual Time Calculation (min): 75 min   Short Term Goals: Week 2:  PT Short Term Goal 1 (Week 2): STG=LTG due to ELOS  Skilled Therapeutic Interventions/Progress Updates:   Pt received sitting in w/c and agreeable to therapy session. Pt performed B UE w/c propulsion for ~117ft with supervision for safety and intermittent cuing to prevent L veering. Performed sit<>stand with RW and CGA/min assist for balance and sit<>stand without AD with min/mod assist for balance due to posterior LOB - pt educated on importance of properly positioning L LE prior to initiating standing for improved standing balance. Orthotist, Merry Proud, present for consult regarding L LE AFO, L shoe toe cap, and R shoe bolster for improved gait mechanics. Pt ambulated ~3ft using RW, wearing L LE AFO, with min/mod assist for L LE management and balance - pt requires manual facilitation/placement for L LE during swing phase. Pt ambulated ~58ft using RW, without AFO, with min/mod assist for L LE management and balance - pt demonstrates decreased toe clearance, increased R LE vaulting during L LE swing phase, and increased compensatory hip hiking to clear L foot during swing compared to when wearing AFO. Pt ambulated ~11ft using RW and wearing AFO with min/mod assist for  L LE management during swing phase and for balance with manual facilitation for improved L LE swing phase and placement prior to initiating stance. Pt continues to demonstrate impaired hip flexor activation during swing phase of gait. Performed sit<>supine with supervision on mat table. Performed R sidelying L LE neuromuscular re-education focusing on motor planning/control to active hip flexors and knee flexors simultaneously for carryover during swing phase of gait - 3 sets to fatigue - pt demonstrates  extensor tone when in full knee extension that prevents her from being able to "break" that tone and achieve hip flexion without manual facilitation therefore wo rked in a limited ROM to prevent full knee extension - pt demonstrates ability to perform active hip flexion and knee flexion (palpable hamstring contraction and movement in gravity eliminated position) but continues to compensate hip flexor weakness by increased pelvic posterior tilt. Performed seated hip flexion contraction in a trunk reclined position to allow for increased ROM - cuing to isolate hip flexion from combined hip flexion and knee extension to decreased quadriceps activation and focus on iliopsoas - pt demonstrates ability to isolate and activate hip flexors but not adequately enough to move LE against gravity requiring manual facilitation for movement - 1 set to fatigue. Performed standing, L UE support on therapist, repeated L LE hip/knee flexion to replicate swing phase of gait to place L foot on 2" step with max assist for L LE management and balance - again due to hip flexor weakness pt unable to lift LE against gravity without manual facilitation - 2 sets to fatigue focusing on proper motor recruitment and isolating hip flexion. Pt ambulated ~47ft using RW, no AFO, for carryover of neuromuscular re-education with min/mod assist for L LE management and balance with therapist manually facilitating L swing phase of gait with pt demonstrating ability to initiate hip flexion - manual facilitation to block R LE vaulting during L swing phase. Pt left seated in w/c in gym with OT present to continue working with pt.  Therapy Documentation Precautions:  Precautions Precautions: Fall Precaution Comments: L LE numbness, weakness Restrictions Weight Bearing Restrictions: No  RLE Weight Bearing: Weight bearing as tolerated  Pain: Patient reports pain that originates in center of lumbar spine and radiates around the lateral side stopping  halfway between her ASIS and umbilicus; unrated and does not complain of pain symptoms during session.  Balance: Standardized Balance Assessment Standardized Balance Assessment: Berg Balance Test Berg Balance Test Sit to Stand: Needs minimal aid to stand or to stabilize Standing Unsupported: Unable to stand 30 seconds unassisted Sitting with Back Unsupported but Feet Supported on Floor or Stool: Able to sit safely and securely 2 minutes Stand to Sit: Uses backs of legs against chair to control descent Transfers: Able to transfer with verbal cueing and /or supervision Standing Unsupported with Eyes Closed: Needs help to keep from falling Standing Ubsupported with Feet Together: Needs help to attain position and unable to hold for 15 seconds From Standing, Reach Forward with Outstretched Arm: Loses balance while trying/requires external support From Standing Position, Pick up Object from Floor: Unable to try/needs assist to keep balance From Standing Position, Turn to Look Behind Over each Shoulder: Needs assist to keep from losing balance and falling Turn 360 Degrees: Needs assistance while turning Standing Unsupported, Alternately Place Feet on Step/Stool: Needs assistance to keep from falling or unable to try Standing Unsupported, One Foot in Front: Loses balance while stepping or standing Standing on One Leg: Unable to try or needs assist to prevent fall Total Score: 9/56 Patient demonstrates increased fall risk as noted by score of  9/56 on Berg Balance Scale.  (<36= high risk for falls, close to 100%; 37-45 significant >80%; 46-51 moderate >50%; 52-55 lower >25%)  Therapy/Group: Individual Therapy  Tawana Scale, PT, DPT 08/12/2018, 2:41 PM

## 2018-08-12 NOTE — Progress Notes (Signed)
Occupational Therapy Session Note  Patient Details  Name: Susan Hodge MRN: [id number] Date of Birth: February 03, 1951  Today's Date: 08/12/2018 OT Individual Time: 5056-9794 OT Individual Time Calculation (min): 72 min    Short Term Goals: Week 2:  OT Short Term Goal 1 (Week 2): Continue working on supervision level goals.  Skilled Therapeutic Interventions/Progress Updates:    Pt completed transfer from supine to sit EOB with supervision.  Once sitting he completed transfer from the bed to the bathroom and toilet with min assist using the RW for support.  Increased compensation with vaulting on the right to advance the left as well as hip circumduction on the left.  She completed bathing with min assist sit to stand at the shower level.  Dressing was then completed at the wheelchair with supervision for UB and min assist for LB including donning AFO on the LLE.  She then complete grooming tasks of oral hygiene and brushing her hair with modified independence.  Finished session with pt up in the wheelchair with call button and phone in reach and safety belt in place.       Therapy Documentation Precautions:  Precautions Precautions: Fall Precaution Comments: L LE numbness, weakness Restrictions Weight Bearing Restrictions: No RLE Weight Bearing: Weight bearing as tolerated  Pain: Pain Assessment Pain Scale: Faces Pain Score: 0-No pain Pain Type: Chronic pain Pain Location: Back Pain Orientation: Lower Pain Descriptors / Indicators: Burning;Throbbing Pain Frequency: Constant Pain Onset: On-going Pain Intervention(s): Medication (See eMAR) ADL: See Care Tool for some details of ADL  Therapy/Group: Individual Therapy  Paradise Vensel OTR/L 08/12/2018, 11:32 AM

## 2018-08-12 NOTE — Plan of Care (Signed)
  Problem: Consults Goal: RH SPINAL CORD INJURY PATIENT EDUCATION Description  See Patient Education module for education specifics.  Outcome: Progressing   Problem: SCI BOWEL ELIMINATION Goal: RH STG MANAGE BOWEL WITH ASSISTANCE Description STG Manage Bowel with Assistance. Mod I  Outcome: Progressing Goal: RH STG SCI MANAGE BOWEL WITH MEDICATION WITH ASSISTANCE Description STG SCI Manage bowel with medication with assistance. Mod I  Outcome: Progressing   Problem: RH SAFETY Goal: RH STG ADHERE TO SAFETY PRECAUTIONS W/ASSISTANCE/DEVICE Description STG Adhere to Safety Precautions With Assistance/Device. Mod I  Outcome: Progressing   Problem: RH PAIN MANAGEMENT Goal: RH STG PAIN MANAGED AT OR BELOW PT'S PAIN GOAL Description Less than 3,on 1 to 10 scale.  Outcome: Progressing   Problem: RH KNOWLEDGE DEFICIT SCI Goal: RH STG INCREASE KNOWLEDGE OF SELF CARE AFTER SCI Description Patient will be able to verbalize self care after SCI with cues/handouts  Outcome: Progressing   Problem: Education: Goal: Ability to state activities that reduce stress will improve Outcome: Progressing   Problem: Coping: Goal: Ability to identify and develop effective coping behavior will improve Outcome: Progressing   Problem: Self-Concept: Goal: Ability to identify factors that promote anxiety will improve Outcome: Progressing Goal: Level of anxiety will decrease Outcome: Progressing Goal: Ability to modify response to factors that promote anxiety will improve Outcome: Progressing

## 2018-08-13 ENCOUNTER — Inpatient Hospital Stay (HOSPITAL_COMMUNITY): Payer: Self-pay | Admitting: Physical Therapy

## 2018-08-13 ENCOUNTER — Inpatient Hospital Stay (HOSPITAL_COMMUNITY): Payer: Medicare HMO | Admitting: Occupational Therapy

## 2018-08-13 MED ORDER — POLYETHYLENE GLYCOL 3350 17 G PO PACK: 17.0000 g | PACK | Freq: Every day | ORAL | Status: DC

## 2018-08-13 MED ORDER — POLYETHYLENE GLYCOL 3350 17 G PO PACK
17.0000 g | PACK | Freq: Every day | ORAL | Status: DC | PRN
  Administered 2018-08-14: 17 g via ORAL
  Filled 2018-08-13: qty 1

## 2018-08-13 NOTE — Progress Notes (Signed)
Occupational Therapy Session Note  Patient Details  Name: Susan Hodge MRN: [id number] Date of Birth: 26-May-1950  Today's Date: 08/13/2018 OT Individual Time: 4707-6151 OT Individual Time Calculation (min): 46 min    Short Term Goals: Week 2:  OT Short Term Goal 1 (Week 2): Continue working on supervision level goals.  Skilled Therapeutic Interventions/Progress Updates:    Pt completed transfer from supine to sit EOB with supervision.  Max assist for donning left shoe and AFO with setup for the right.  She then completed toilet transfer to the bathroom with use of the RW and min assist.  Decreased ability to efficiently advance the LLE, but still more improved with use of AFO and toe cap on the shoe.  She completed toileting with min assist sit to stand before ambulating out to the wheelchair.  She completed hand washing from seated position with modified independence.  Had pt transfer down to the dayroom where the Kinetron was utilized for LLE strengthening.  Mod assist for transfer onto the machine where she completed 3 sets of approximately 3 mins with resistance at 30, 20, and 15 cm/sec.  Min assist needed to avoid LLE adduction when attempting to push to foot peddle down.  Finished session with pt transferring back to the wheelchair and then rolling herself back to her room.  Call button and phone in reach with alarm belt in place.    Therapy Documentation Precautions:  Precautions Precautions: Fall Precaution Comments: L LE numbness, weakness Restrictions Weight Bearing Restrictions: No RLE Weight Bearing: Weight bearing as tolerated  Pain: Pain Assessment Pain Scale: Faces Pain Score: 0-No pain ADL: See Care Tool for some details  Therapy/Group: Individual Therapy  Cranston Koors OTR/L 08/13/2018, 4:13 PM

## 2018-08-13 NOTE — Progress Notes (Signed)
Occupational Therapy Session Note  Patient Details  Name: Susan Hodge MRN: [id number] Date of Birth: [birth year]  Today's Date: 08/13/2018 OT Individual Time: 2956-2130 OT Individual Time Calculation (min): 72 min    Short Term Goals: Week 2:  OT Short Term Goal 1 (Week 2): Continue working on supervision level goals.  Skilled Therapeutic Interventions/Progress Updates:   Pt completed shower and dressing during session.  Initially, she began with transfer from the EOB to the toilet with min assist.  Decreased ability to clear the LLE with stepping, requiring min assist at times to clear the left foot.  She was able to complete all aspects of toileting with min guard assist before transferring over to the tub bench with min guard assist.  She completed bathing sit to stand with min guard assist and use of the grab bars for support.  Min instructional cueing to make sure she positioned the LLE back under her prior to attempting sit to stand.  She was able to complete all dressing sit to stand at the sink with supervision for UB, min guard for brief and pants and mod assist for TEDs, AFO, and shoe.  Grooming tasks completed at wheelchair level to conclude session.  Finished with pt in the wheelchair and call button and phone in reach with safety alarm belt in place.   Therapy Documentation Precautions:  Precautions Precautions: Fall Precaution Comments: L LE numbness, weakness Restrictions Weight Bearing Restrictions: No RLE Weight Bearing: Weight bearing as tolerated  Pain: Pain Assessment Pain Scale: Faces Pain Score: 0-No pain ADL: See Care Tool Section for some details of ADL  Therapy/Group: Individual Therapy  Kenedie Dirocco OTR/L 08/13/2018, 11:44 AM

## 2018-08-13 NOTE — Progress Notes (Signed)
[facility] PHYSICAL MEDICINE & REHABILITATION PROGRESS NOTE  Subjective/Complaints:  Feels well,no cough , no chills   ROS: Denies CP, shortness of breath, nausea, vomiting, diarrhea.  Objective: Vital Signs: Blood pressure (!) 98/54, pulse 75, temperature 98 F (36.7 C), resp. rate 18, height 5' (1.524 m), SpO2 100 %. No results found. No results for input(s): WBC, HGB, HCT, PLT in the last 72 hours. No results for input(s): NA, K, CL, CO2, GLUCOSE, BUN, CREATININE, CALCIUM in the last 72 hours.  Physical Exam: BP (!) 98/54   Pulse 75   Temp 98 F (36.7 C)   Resp 18   Ht 5' (1.524 m)   SpO2 100%   BMI 24.58 kg/m  Gen: NAD.  Vital signs reviewed. HENT: Normocephalic.  Atraumatic. Eyes: EOMI. No discharge.  Cardio: No JVD. Pulm: Effort normal, lungs clear no wheezes or rales Neuro: Alert and oriented Musc: No edema or tenderness in lower extremities. Neurological: She isalertand oriented Motor:  Right lower extremity: 4-4+/5 proximal distal, unchanged Left lower extremity: Hip flexion, knee extension 2+/5, ankle dorsiflexion 1/5, unchanged Skin: Warm and dry.  Intact. Psychiatric: Normal mood.  Normal affect.  Assessment/Plan: 1. Functional deficits secondary to C3-4, C7 myelitis which require 3+ hours per day of interdisciplinary therapy in a comprehensive inpatient rehab setting.  Physiatrist is providing close team supervision and 24 hour management of active medical problems listed below.  Physiatrist and rehab team continue to assess barriers to discharge/monitor patient progress toward functional and medical goals  Care Tool:  Bathing    Body parts bathed by patient: Right arm, Chest, Left arm, Right upper leg, Left upper leg, Right lower leg, Abdomen, Front perineal area, Left lower leg, Face, Buttocks     Body parts n/a: Front perineal area   Bathing assist Assist Level: Minimal Assistance - Patient > 75%     Upper Body Dressing/Undressing Upper  body dressing   What is the patient wearing?: Pull over shirt, Bra    Upper body assist Assist Level: Set up assist    Lower Body Dressing/Undressing Lower body dressing      What is the patient wearing?: Incontinence brief, Underwear/pull up     Lower body assist Assist for lower body dressing: Minimal Assistance - Patient > 75%     Toileting Toileting    Toileting assist Assist for toileting: Minimal Assistance - Patient > 75%     Transfers Chair/bed transfer  Transfers assist     Chair/bed transfer assist level: Minimal Assistance - Patient > 75%     Locomotion Ambulation   Ambulation assist      Assist level: Minimal Assistance - Patient > 75% Assistive device: Walker-rolling Max distance: 15'   Walk 10 feet activity   Assist     Assist level: Minimal Assistance - Patient > 75% Assistive device: Walker-rolling   Walk 50 feet activity   Assist Walk 50 feet with 2 turns activity did not occur: Safety/medical concerns  Assist level: Minimal Assistance - Patient > 75% Assistive device: Walker-rolling    Walk 150 feet activity   Assist Walk 150 feet activity did not occur: Safety/medical concerns         Walk 10 feet on uneven surface  activity   Assist Walk 10 feet on uneven surfaces activity did not occur: Safety/medical concerns         Wheelchair     Assist Will patient use wheelchair at discharge?: Yes Type of Wheelchair: Agricultural engineer  assist level: Supervision/Verbal cueing Max wheelchair distance: 200    Wheelchair 50 feet with 2 turns activity    Assist        Assist Level: Supervision/Verbal cueing   Wheelchair 150 feet activity     Assist Wheelchair 150 feet activity did not occur: Safety/medical concerns   Assist Level: Supervision/Verbal cueing      Medical Problem List and Plan: 1.Left side weakness and hemisensory deficitssecondary to C3-4, C7 myelitis    Continue CIR, PT,  OT, ELOS 4/15 PRAFO ordered 2. Antithrombotics: DVT/anticoagulation:Acute DVT left gastrocnemius vein.Transitioned from Lovenox to Eliquis Antiplatelet therapy: N/A 3. Pain Management/fibromyalgia/migraine headaches:Valproic acid 250 mg twice a day Lyrica 50 mg 3 times a day,oxycodone as needed  Relatively controlled on 4/10   4. Mood:Xanax 0.5 mg 3 times a day as needed  Severe anxiety at present, will consider Lexapro pending response to therapies- anxiety improving -antipsychotic agents: N/A 5. Neuropsych: This patientiscapable of making decisions on herown behalf. 6. Skin/Wound Care:Routine skin checks 7. Fluids/Electrolytes/Nutrition:Routine in and out's  8. GERD. Protonix 9. Tobacco abuse. Counseling 10.  Hypoalbuminemia  Supplement initiated on 4/1 11.  Acute blood loss anemia- no obvious blood loss, on Eliquis  Hemoglobin 11.9 on 4/1, stable 11.8 on 4/6    Continue to monitor 12.  Neurogenic bowel/bladder  Timed toileting  PVRs relatively unremarkable  Trial Enablex started on 4/5   14.  Hypotension asymptomatic- low normal Vitals:   08/13/18 0523 08/13/18 0550  BP: (!) 114/101 (!) 98/54  Pulse: 75   Resp: 18   Temp:    SpO2: 100%     No antihypertensive meds 15. 3rd generation contact with Covid pt but not within 6 ft.  Will monitor for sx but not high risk based on exposure hx LOS: 10 days A FACE TO FACE EVALUATION WAS PERFORMED  Charlett Blake 08/13/2018, 8:17 AM

## 2018-08-13 NOTE — Progress Notes (Signed)
Patient has not had a bm since 4/4 has been given PRN sorbitol. Patient requesting pain medication. Educated patient on the side effects of pain medication. Patient stated "I know, I take fiber at home and it works fine." Will continue to monitor.

## 2018-08-13 NOTE — Progress Notes (Signed)
Physical Therapy Session Note  Patient Details  Name: Susan Hodge MRN: [id number] Date of Birth: 07-21-1950  Today's Date: 08/13/2018 PT Individual Time: 1124-1239 PT Individual Time Calculation (min): 75 min   Short Term Goals: Week 2:  PT Short Term Goal 1 (Week 2): STG=LTG due to ELOS  Skilled Therapeutic Interventions/Progress Updates:    Pt received sitting in w/c and agreeable to therapy session. Pt appears tired and reports increased fatigue today - she contributes this to being unable to achieve a full nights rest while being in hospital - RN notified at end of session. Pt now has L shoe toe cap and lift in R shoe from orthotist consult yesterday - donned for entire session. Pt performed ~127ft B UE w/c propulsion from room to gym with supervision for safety. Pt performed R squat pivot transfer w/c to EOM with close supervision for safety and minor cuing for proper transfer set-up for safety. Pt achieved tall kneeling on mat table with max assist for balance and L LE management to get on the table. Performed static tall kneeling focusing on equal weightbearing with manual facilitation and mirror visual feedback for L weight shifting to achieve midline as well as for improved upright trunk posture via hip extension - progressed from B UE support to unilateral to no UE support focusing on maintaining gluteal contraction and core stability to hold upright posture. Pt demonstrated improving ability to maintain upright without UE support for ~5 seconds prior to LOB requiring min assist and use of B UEs to recover. Performed repeated sit to mini squat position with 2" step under R LE, mirror feedback, and manual facilitation all to promote L weight shift for increased L LE muscular activation - max assist provided throughout for lifting, L knee blocking, and balance - removed L AFO for 2nd set with pt requiring increased assist for blocking L knee buckle. Pt ambulated ~50ft, no AD with L UE  support on therapist and without LLE AFO, with mod assist for L LE foot clearance and placement during swing phase and for trunk balance - multimodal cuing throughout for sequencing of gait and manual facilitation for improved hip extension to promote upright trunk posture and weightshifting onto stance leg. Vitals assessed due to pt continuing to report increased fatigue: 94/49 (MAP 65) HR 73bpm. After seated rest break pt ambulated ~60ft, no AD with L UE support on therapist and without L LE AFO, mod assist for L LE management of placement and foot clearance during swing phase as well as assist for trunk control - manual facilitation for hip extension to improve upright trunk posture and for weightshifting onto L LE during stance phase - towards end of ambulation pt required max assist to prevent LOB due to L LE knee buckling and pt continuing to report increased fatigue - seated break in w/c provided. Vitals reassessed: BP 110/54 (MAP 69) HR 73bpm. Pt performed ~169ft  BUE w/c propulsion from gym to room with supervision for safety. Performed L squat pivot transfer w/c to EOB with close supervision for safety. Performed sit to supine with min assist for L LE management and pt left supine in bed with needs in reach and bed alarm on.   Therapy Documentation Precautions:  Precautions Precautions: Fall Precaution Comments: L LE numbness, weakness Restrictions Weight Bearing Restrictions: No RLE Weight Bearing: Weight bearing as tolerated  Vital Signs: Therapy Vitals Pulse Rate: 73 BP: (!) 110/54 Patient Position (if appropriate): Sitting(during therapy) Pain:   Reports she continues  to have the L lumbar pain that radiates around the lateral hip to midway between ASIS and umbilicus rated at 0-1/72 - pt reports the pain increases with physical activity - rest breaks provided throughout for pain management. Reports her increased fatigue levels also contribute to her pain level.   Therapy/Group:  Individual Therapy  Tawana Scale, PT, DPT 08/13/2018, 7:52 AM

## 2018-08-14 ENCOUNTER — Inpatient Hospital Stay (HOSPITAL_COMMUNITY): Payer: Medicare HMO | Admitting: Physical Therapy

## 2018-08-14 NOTE — Progress Notes (Signed)
Patient last documented BM was on 08/06/18. Patient reports LBM was 08/08/18.  Sorbital given on day shift but ineffective. Dr. Burnice Logan notified, new order for Miralax PRN. Miralax offered to patient and Patient declined states she will take it in the morning.

## 2018-08-14 NOTE — Progress Notes (Signed)
Susan Hodge is a 68 y.o. female who was admitted for CIR with functional deficits secondary to C3-4, C7 myelitis.  Past Medical History:  Diagnosis Date  . Chest pain   . Depression   . DJD (degenerative joint disease)   . FH: CVA (cerebrovascular accident)    father  . Fibromyalgia   . GERD (gastroesophageal reflux disease)   . Hypothyroidism   . Lupus ([place])   . Migraines   . Palpitations   . Status post cholecystectomy    appendectomy and bilaateral oophorectomy  . Tobacco use      Subjective: Complains of right ear discomfort this a.m.  Otherwise slept well and has no new complaints or concerns  Objective: Vital signs in last 24 hours: Temp:  [98.1 F (36.7 C)-98.3 F (36.8 C)] 98.1 F (36.7 C) (04/11 0506) Pulse Rate:  [66-75] 66 (04/11 0506) Resp:  [16-19] 16 (04/11 0506) BP: (94-110)/(49-67) 94/67 (04/11 0506) SpO2:  [99 %-100 %] 99 % (04/11 0506) Weight change:  Last BM Date: 08/08/18  Intake/Output from previous day: 04/10 0701 - 04/11 0700 In: 360 [P.O.:360] Out: -    Patient Vitals for the past 24 hrs:  BP Temp Temp src Pulse Resp SpO2  08/14/18 0506 94/67 98.1 F (36.7 C) Oral 66 16 99 %  08/13/18 2011 (!) 101/56 98.1 F (36.7 C) Oral 74 16 100 %  08/13/18 1544 (!) 99/54 98.3 F (36.8 C) Oral 75 19 100 %  08/13/18 1227 (!) 110/54 - - 73 - -  08/13/18 1219 (!) 94/49 - - 73 - -     Physical Exam General: No apparent distress   HEENT: The right canal and tympanic membrane were both normal Lungs: Normal effort. Lungs clear to auscultation, no crackles or wheezes. Cardiovascular: Regular rate and rhythm, no edema Abdomen: S/NT/ND; BS(+) Musculoskeletal:  unchanged Neurological: No new neurological deficits with left lower extremity weakness Wounds: N/A    Skin: clear  Mental state: Alert, oriented, cooperative    Lab Results: BMET    Component Value Date/Time   NA 134 (L) 08/09/2018 1015   K 3.9 08/09/2018 1015   CL 99 08/09/2018  1015   CO2 27 08/09/2018 1015   GLUCOSE 100 (H) 08/09/2018 1015   BUN 16 08/09/2018 1015   CREATININE 0.77 08/09/2018 1015   CALCIUM 8.4 (L) 08/09/2018 1015   GFRNONAA >60 08/09/2018 1015   GFRAA >60 08/09/2018 1015   CBC    Component Value Date/Time   WBC 9.6 08/09/2018 1015   RBC 3.79 (L) 08/09/2018 1015   HGB 11.8 (L) 08/09/2018 1015   HCT 35.1 (L) 08/09/2018 1015   PLT 154 08/09/2018 1015   MCV 92.6 08/09/2018 1015   MCH 31.1 08/09/2018 1015   MCHC 33.6 08/09/2018 1015   RDW 13.0 08/09/2018 1015   LYMPHSABS 1.1 08/09/2018 1015   MONOABS 0.8 08/09/2018 1015   EOSABS 0.3 08/09/2018 1015   BASOSABS 0.0 08/09/2018 1015    Medications: I have reviewed the patient's current medications.  Assessment/Plan:  Functional deficits with left-sided weakness and hemi-sensory deficits secondary to myelitis.  Continue CIR DVT prophylaxis/history of acute DVT left gastrocnemius vein.  Continue Eliquis Neurogenic bowel and bladder.  Trial of Enablex started History of hypotension.  Remains asymptomatic.  Blood pressure remains low normal   Length of stay, days: 11  Marletta Lor , MD 08/14/2018, 10:16 AM

## 2018-08-14 NOTE — Progress Notes (Signed)
Physical Therapy Session Note  Patient Details  Name: Susan Hodge MRN: [id number] Date of Birth: Nov 09, 1950  Today's Date: 08/14/2018 PT Individual Time: 0920-1003 PT Individual Time Calculation (min): 43 min   Short Term Goals: Week 2:  PT Short Term Goal 1 (Week 2): STG=LTG due to ELOS  Skilled Therapeutic Interventions/Progress Updates:   Pt received sitting on toilet and agreeable to therapy session reporting increased energy today compared to yesterday. Pt continent of urine and performed seated peri-care with set-up assist and supervision for sitting balance. Pt requesting to complete dressing at beginning of session. Pt performed seated UB dressing with supervision for sitting balance. Performed LB dressing with mod assist for time management. Performed sit<>stand and stand pivot transfer BSC over toilet to w/c using grab bars with min assist for L LE management.  Pt transported in w/c to therapy gym for time management and energy conservation. Pt performed squat pivot transfer w/c<>EOM with close supervision for safety. Pt performed sit<>stand from mat without AD with varying min/mod assist for lifting and balance. Pt performed 4 bouts of repeated L LE step-up on 2" step with L UE support on therapist and max assist for L LE knee control and standing balance - mirror feedback provided for improved upright posture and L lateral weight shifting - pt continues to demonstrate impaired motor planning and timing as well as impaired motor coordination requiring max verbal cuing for sequencing of task and manual facilitation for weight shifting Pt returned to room in w/c and left seated in w/c with needs in reach and seat belt alarm on.  Therapy Documentation Precautions:  Precautions Precautions: Fall Precaution Comments: L LE numbness, weakness Restrictions Weight Bearing Restrictions: No RLE Weight Bearing: Weight bearing as tolerated  Pain:   No reports of pain during session.     Therapy/Group: Individual Therapy  Tawana Scale, PT, DPT 08/14/2018, 7:48 AM

## 2018-08-15 ENCOUNTER — Inpatient Hospital Stay (HOSPITAL_COMMUNITY): Payer: Medicare HMO

## 2018-08-15 NOTE — Progress Notes (Signed)
Susan Hodge is a 68 y.o. female admitted for CIR with functional deficits secondary to C3-4, C7 myelitis  Subjective: No new complaints. No new problems.  No further complaints of right ear discomfort.  Feels that her left leg weakness has slightly improved  Past Medical History:  Diagnosis Date  . Chest pain   . Depression   . DJD (degenerative joint disease)   . FH: CVA (cerebrovascular accident)    father  . Fibromyalgia   . GERD (gastroesophageal reflux disease)   . Hypothyroidism   . Lupus ([place])   . Migraines   . Palpitations   . Status post cholecystectomy    appendectomy and bilaateral oophorectomy  . Tobacco use      Objective: Vital signs in last 24 hours: Temp:  [97.9 F (36.6 C)-98 F (36.7 C)] 97.9 F (36.6 C) (04/12 0331) Pulse Rate:  [60-70] 60 (04/12 0331) Resp:  [16-18] 18 (04/12 0331) BP: (92-104)/(50-57) 92/50 (04/12 0331) SpO2:  [100 %] 100 % (04/12 0331) Weight change:  Last BM Date: 08/08/18  Intake/Output from previous day: 04/11 0701 - 04/12 0700 In: 600 [P.O.:600] Out: -   Patient Vitals for the past 24 hrs:  BP Temp Temp src Pulse Resp SpO2  08/15/18 0331 (!) 92/50 97.9 F (36.6 C) - 60 18 100 %  08/14/18 1936 (!) 104/57 97.9 F (36.6 C) Oral 70 16 100 %  08/14/18 1452 (!) 94/52 98 F (36.7 C) Oral 66 18 100 %     Physical Exam General: No apparent distress   HEENT: Right tympanic membrane and canal remain normal Lungs: Normal effort. Lungs clear to auscultation, no crackles or wheezes. Cardiovascular: Regular rate and rhythm, no edema Abdomen: S/NT/ND; BS(+) Musculoskeletal:  unchanged Neurological: No new neurological deficits with left lower extremity weakness Extremities-no edema Skin: clear   Mental state: Alert, oriented, cooperative    Lab Results: BMET    Component Value Date/Time   NA 134 (L) 08/09/2018 1015   K 3.9 08/09/2018 1015   CL 99 08/09/2018 1015   CO2 27 08/09/2018 1015   GLUCOSE 100 (H)  08/09/2018 1015   BUN 16 08/09/2018 1015   CREATININE 0.77 08/09/2018 1015   CALCIUM 8.4 (L) 08/09/2018 1015   GFRNONAA >60 08/09/2018 1015   GFRAA >60 08/09/2018 1015   CBC    Component Value Date/Time   WBC 9.6 08/09/2018 1015   RBC 3.79 (L) 08/09/2018 1015   HGB 11.8 (L) 08/09/2018 1015   HCT 35.1 (L) 08/09/2018 1015   PLT 154 08/09/2018 1015   MCV 92.6 08/09/2018 1015   MCH 31.1 08/09/2018 1015   MCHC 33.6 08/09/2018 1015   RDW 13.0 08/09/2018 1015   LYMPHSABS 1.1 08/09/2018 1015   MONOABS 0.8 08/09/2018 1015   EOSABS 0.3 08/09/2018 1015   BASOSABS 0.0 08/09/2018 1015    Medications: I have reviewed the patient's current medications.  Assessment/Plan:  Functional deficits secondary to myelitis.  Continue CIR Neurogenic bowel and bladder.  Continue Enablex DVT prophylaxis/history of acute left gastrocnemius vein DVT.  Continue Eliquis History of hypotension.  Blood pressure remains low normal    Length of stay, days: 12  Marletta Lor , MD 08/15/2018, 9:09 AM

## 2018-08-15 NOTE — Progress Notes (Signed)
Physical Therapy Session Note  Patient Details  Name: Susan Hodge MRN: [id number] Date of Birth: 1950/07/18  Today's Date: 08/15/2018 PT Individual Time: 1301-1400 PT Individual Time Calculation (min): 59 min   Short Term Goals: Week 2:  PT Short Term Goal 1 (Week 2): STG=LTG due to ELOS  Skilled Therapeutic Interventions/Progress Updates:    Pt seated in w/c upon PT arrival, agreeable to therapy tx and denies pain. Pt seated in w/c donned teds and shoes with supervision. Pt propelled w/c to the gym with supervision x 150 ft using B UEs. Pt set up w/c for transfer to the mat, min cues for set up and w/c parts management. Pt performed squat pivot transfer to the mat with supervision. Pt performed x 10 mini squats this session with mirror for visual feedback, emphasis on L knee control and maintaining symmetric LE weightbearing. Pt transferred to supine this session and instructed in LE exercises for strengthening as HEP (handout provided), 2 x 10 each: bridges, active assisted hip flexion, sidelying hip abductor clamshell, active assisted straight leg raise, quadruped cat/camel and quadruped weightshifting. Pt transferred from quadruped<>tall kneeling this session with mod assist, cues for techniques and glute activation. Pt transferred to toilet this session with min assist, continent of bladder. Pt left in w/c at end of session with needs in reach and chair alarm set.   Therapy Documentation Precautions:  Precautions Precautions: Fall Precaution Comments: L LE numbness, weakness Restrictions Weight Bearing Restrictions: No RLE Weight Bearing: Weight bearing as tolerated    Therapy/Group: Individual Therapy  Netta Corrigan, PT, DPT 08/15/2018, 7:57 AM

## 2018-08-16 ENCOUNTER — Inpatient Hospital Stay (HOSPITAL_COMMUNITY): Payer: Self-pay

## 2018-08-16 ENCOUNTER — Inpatient Hospital Stay (HOSPITAL_COMMUNITY): Payer: Medicare HMO | Admitting: Occupational Therapy

## 2018-08-16 NOTE — Progress Notes (Signed)
Physical Therapy Session Note  Patient Details  Name: Susan Hodge MRN: [id number] Date of Birth: [birth year]  Today's Date: 08/16/2018 PT Individual Time: 1330-1440 PT Individual Time Calculation (min): 70 min   Short Term Goals: Week 2:  PT Short Term Goal 1 (Week 2): STG=LTG due to ELOS  Skilled Therapeutic Interventions/Progress Updates:    Pt seated in w/c upon PT arrival, agreeable to therapy tx and denies pain. Pt's friend [name]) present for caregiver training. Pt reports having to use bathroom. Pt performed stand pivot w/c<>toilet this session with RW and CGA, CGA for standing balance while pt performs clothing management. Pt propelled w/c to gym with supervision. Orthotist present to trial x 2 different orthotics this session. Pt ambulated 2 x 30 ft with RW and min assist, orthotist providing feedback for AFO recommendations. Pt ambulated x 20 ft with 2 turns, assist from caregiver for education/training. Therapist educated pt and caregiver on use of AFO and recommendations for assist/supervision with all mobility. Pt worked on ambulation within rehab apartments and furniture transfers with RW and assist from caregiver. Pt performed stand pivot car transfer with RW and min assist. Pt performed squat pivot w/c<>mat with supervision. Pt transferred to supine with supervision. Pt educated on HEP, therapist providing education to caregiver for HEP including active assisted hip flexion exercises. Pt transported back to room and left in w/c with needs in reach and chair alarm set.   Therapy Documentation Precautions:  Precautions Precautions: Fall Precaution Comments: L LE numbness, weakness Restrictions Weight Bearing Restrictions: No RLE Weight Bearing: Weight bearing as tolerated    Therapy/Group: Individual Therapy  Netta Corrigan, PT, DPT 08/16/2018, 7:51 AM

## 2018-08-16 NOTE — Progress Notes (Addendum)
Social Work Patient ID: Susan Hodge, female   DOB: August 14, 1950, 68 y.o.   MRN: [id number] [place] in-law here for education to prepare for discharge Wed. She is a DM Coordinator here and Therapist, sports by trade. Aware pt will need 24 hr supervision at discharge. Pref [facility] for home health services, will make referral and see if will accept referral.  [facility] has accepted referral and will contact for services once discharged on Wed.

## 2018-08-16 NOTE — Progress Notes (Signed)
Occupational Therapy Session Note  Patient Details  Name: Susan Hodge MRN: [id number] Date of Birth: 1950-08-15  Today's Date: 08/16/2018 OT Individual Time: 1224-8250 and 1445-1555 OT Individual Time Calculation (min): 56 min and 70 min    Short Term Goals: Week 2:  OT Short Term Goal 1 (Week 2): Continue working on supervision level goals.  Skilled Therapeutic Interventions/Progress Updates:    Session 1: Upon entering the room, pt discussing home set up in preparation for family education this session. OT recommended 3 in1 commode chair and TTB with pt verbalizing agreement. OT discussed pt's routine and importance of energy conservation. Education to continue on this topic with self care tasks. Pt verbalized need for toileting and transferred from wheelchair > toilet over commode with min cuing for safety awareness and technique. Pt needing mod A for balance without use of RW. Pt needing assistance with clothing management again. Pt unable to have BM but able to void. Pt performed hygiene but needing min A standing balance while she pulled clothing over B hips. Self care tasks performed at sink in standing with min A as well. Pt seated in wheelchair with chair alarm belt donned and call bell within reach upon exiting the room.   Session 2: Upon entering the room, pt seated in wheelchair with caregiver, [name], present for hands on family education. Pt transitioned from PT family education without reports of pain but reports fatigue. Caregiver assisted pt via wheelchair to tub room for energy conservation. Pt and caregiver verbalized pt needing to ambulate into bathroom. OT recommended pt wear AFO and B shoes for safety. Caregiver ambulating with pt into bathroom with CGA and pt seated on edge of TTB. Min guard for safety with balance as pt sliding self and lifting L LE over tub ledge. Pt remained seated in shower with lateral leans to wash buttocks and peri area and caregiver providing  supervision. OT recommended purchase of safety treads to decrease fall risk while in shower. Pt donning clothing items from seated position on edge of bench. Pt able to utilize figure four position to thread clothing onto B feet. Pt unable to don L shoe with AFO without assistance. Pt standing with min guard - min A for standing balance during LB clothing management. OT discussed recommendation for HHOT with pt and caregiver asking appropriate questions as needed. Pt with no further concern at this time and returning to room in same manner as above. Call bell and all needed items within reach.   Therapy Documentation Precautions:  Precautions Precautions: Fall Precaution Comments: L LE numbness, weakness Restrictions Weight Bearing Restrictions: Yes RLE Weight Bearing: Weight bearing as tolerated   Pain: Pain Assessment Pain Scale: 0-10 Pain Score: 4  Faces Pain Scale: Hurts a little bit Pain Type: Acute pain Pain Location: Back PAINAD (Pain Assessment in Advanced Dementia) Breathing: normal Facial Expression: smiling or inexpressive ADL: ADL Eating: Independent Where Assessed-Eating: Wheelchair Grooming: Setup Where Assessed-Grooming: Sitting at sink, Wheelchair Upper Body Bathing: Setup Where Assessed-Upper Body Bathing: Wheelchair, Sitting at sink Lower Body Bathing: Minimal assistance Where Assessed-Lower Body Bathing: Wheelchair, Sitting at sink, Standing at sink Upper Body Dressing: Supervision/safety Where Assessed-Upper Body Dressing: Sitting at sink Lower Body Dressing: Moderate assistance Where Assessed-Lower Body Dressing: Wheelchair Toileting: Moderate assistance Where Assessed-Toileting: Bedside Commode Toilet Transfer: Maximal assistance Toilet Transfer Method: Stand pivot Toilet Transfer Equipment: Bedside commode   Therapy/Group: Individual Therapy  Gypsy Decant 08/16/2018, 12:18 PM

## 2018-08-16 NOTE — Progress Notes (Signed)
PHYSICAL MEDICINE & REHABILITATION PROGRESS NOTE  Subjective/Complaints:  No issues overnite   ROS: Denies CP, shortness of breath, nausea, vomiting, diarrhea.  Objective: Vital Signs: Blood pressure (!) 101/53, pulse 68, temperature 97.7 F (36.5 C), temperature source Oral, resp. rate 16, height 5' (1.524 m), SpO2 99 %. No results found. No results for input(s): WBC, HGB, HCT, PLT in the last 72 hours. No results for input(s): NA, K, CL, CO2, GLUCOSE, BUN, CREATININE, CALCIUM in the last 72 hours.  Physical Exam: BP (!) 101/53 (BP Location: Left Arm)   Pulse 68   Temp 97.7 F (36.5 C) (Oral)   Resp 16   Ht 5' (1.524 m)   SpO2 99%   BMI 24.58 kg/m  Gen: NAD.  Vital signs reviewed. HENT: Normocephalic.  Atraumatic. Eyes: EOMI. No discharge.  Cardio: No JVD. Pulm: Effort normal, lungs clear no wheezes or rales Neuro: Alert and oriented Musc: No edema or tenderness in lower extremities. Neurological: She isalertand oriented Motor:  Right lower extremity: 4-4+/5 proximal distal, unchanged Left lower extremity: Hip flexion, knee extension 2+/5, ankle dorsiflexion 1/5, unchanged Skin: Warm and dry.  Intact. Psychiatric: Normal mood.  Normal affect.  Assessment/Plan: 1. Functional deficits secondary to C3-4, C7 myelitis which require 3+ hours per day of interdisciplinary therapy in a comprehensive inpatient rehab setting.  Physiatrist is providing close team supervision and 24 hour management of active medical problems listed below.  Physiatrist and rehab team continue to assess barriers to discharge/monitor patient progress toward functional and medical goals  Care Tool:  Bathing    Body parts bathed by patient: Right arm, Chest, Left arm, Right upper leg, Left upper leg, Right lower leg, Abdomen, Front perineal area, Left lower leg, Face, Buttocks     Body parts n/a: Front perineal area   Bathing assist Assist Level: Contact Guard/Touching assist     Upper Body Dressing/Undressing Upper body dressing   What is the patient wearing?: Pull over shirt, Bra    Upper body assist Assist Level: Set up assist    Lower Body Dressing/Undressing Lower body dressing      What is the patient wearing?: Incontinence brief, Underwear/pull up     Lower body assist Assist for lower body dressing: Contact Guard/Touching assist     Toileting Toileting    Toileting assist Assist for toileting: Minimal Assistance - Patient > 75%     Transfers Chair/bed transfer  Transfers assist     Chair/bed transfer assist level: Supervision/Verbal cueing     Locomotion Ambulation   Ambulation assist      Assist level: Moderate Assistance - Patient 50 - 74% Assistive device: Hand held assist(L UE support) Max distance: 10ft   Walk 10 feet activity   Assist     Assist level: Moderate Assistance - Patient - 50 - 74% Assistive device: Hand held assist(L UE support)   Walk 50 feet activity   Assist Walk 50 feet with 2 turns activity did not occur: Safety/medical concerns  Assist level: Minimal Assistance - Patient > 75% Assistive device: Walker-rolling    Walk 150 feet activity   Assist Walk 150 feet activity did not occur: Safety/medical concerns         Walk 10 feet on uneven surface  activity   Assist Walk 10 feet on uneven surfaces activity did not occur: Safety/medical concerns         Wheelchair     Assist Will patient use wheelchair at discharge?: Yes Type of Wheelchair:  Manual    Wheelchair assist level: Supervision/Verbal cueing Max wheelchair distance: 154ft    Wheelchair 50 feet with 2 turns activity    Assist        Assist Level: Supervision/Verbal cueing   Wheelchair 150 feet activity     Assist Wheelchair 150 feet activity did not occur: Safety/medical concerns   Assist Level: Supervision/Verbal cueing      Medical Problem List and Plan: 1.Left side weakness and  hemisensory deficitssecondary to C3-4, C7 myelitis    Continue CIR, PT, OT, ELOS 4/15 - looks on track for d/c PRAFO ordered 2. Antithrombotics: DVT/anticoagulation:Acute DVT left gastrocnemius vein.Transitioned from Lovenox to Eliquis Antiplatelet therapy: N/A 3. Pain Management/fibromyalgia/migraine headaches:Valproic acid 250 mg twice a day Lyrica 50 mg 3 times a day,oxycodone as needed  Relatively controlled on 4/13   4. Mood:Xanax 0.5 mg 3 times a day as needed  Severe anxiety at present, will consider Lexapro pending response to therapies- anxiety improving -antipsychotic agents: N/A 5. Neuropsych: This patientiscapable of making decisions on herown behalf. 6. Skin/Wound Care:Routine skin checks 7. Fluids/Electrolytes/Nutrition:Routine in and out's  8. GERD. Protonix 9. Tobacco abuse. Counseling 10.  Hypoalbuminemia  Supplement initiated on 4/1 11.  Acute blood loss anemia- no obvious blood loss, on Eliquis  Hemoglobin 11.9 on 4/1, stable 11.8 on 4/6    Continue to monitor 12.  Neurogenic bowel/bladder  Timed toileting  PVRs relatively unremarkable  Trial Enablex started on 4/5   14.  BPs low normal- no dizziness Vitals:   08/15/18 1953 08/16/18 0443  BP: (!) 113/57 (!) 101/53  Pulse: 81 68  Resp:  16  Temp: 98.5 F (36.9 C) 97.7 F (36.5 C)  SpO2: 100% 99%    No antihypertensive meds 15. 3rd generation contact with Covid pt but not within 6 ft.  Will monitor for sx but not high risk based on exposure hx LOS: 13 days A FACE TO [place] [facility] Susan Hodge 08/16/2018, 7:25 AM

## 2018-08-17 ENCOUNTER — Inpatient Hospital Stay (HOSPITAL_COMMUNITY): Payer: Self-pay

## 2018-08-17 ENCOUNTER — Inpatient Hospital Stay (HOSPITAL_COMMUNITY): Payer: Medicare HMO | Admitting: Occupational Therapy

## 2018-08-17 ENCOUNTER — Inpatient Hospital Stay (HOSPITAL_COMMUNITY): Payer: Self-pay | Admitting: Physical Therapy

## 2018-08-17 NOTE — Progress Notes (Signed)
Social Work  Discharge Note  The overall goal for the admission was met for:   Discharge location: [facility]  Length of Stay: Yes-15 DAYS  Discharge activity level: Yes-SUPERVISION-MIN ASSIST LEVEL  Home/community participation: Yes  Services provided included: MD, RD, PT, OT, SLP, RN, CM, Pharmacy and SW  Financial Services: Private Insurance: [place]  Follow-up services arranged: Home Health: [facility]-PT, OT, AIDE, DME: [facility], ROLLING WALKER, 3 IN 1 AND TUB BENCH and Patient/Family request agency HH: [facility], DME: [facility]  Comments (or additional information):PT NOT ALWAYS AWARE OF HER DEFICITS AND FEELS SHE CAN DO MORE THAN SHE ACTUALLY CAN. WHICH MAKES HER A SAFETY RISK. DAUGHTER IN-LAW IS AWARE AND A RN HERSELF. TEACHING WAS COMPLETED AND BOTH COMFORTABLE WITH THE PLAN HOME.  Patient/Family verbalized understanding of follow-up arrangements: Yes  Individual responsible for coordination of the follow-up plan: MARIE-DAUGHTER IN-LAW  Confirmed correct DME delivered: [name] 08/17/2018    [name]

## 2018-08-17 NOTE — Progress Notes (Signed)
Physical Therapy Discharge Summary  Patient Details  Name: Susan Hodge MRN: [id number] Date of Birth: 1950/12/06  Today's Date: 08/17/2018 PT Individual Time: 1600-1700 PT Individual Time Calculation (min): 60 min    Patient has met 10 of 11 long term goals due to improved activity tolerance, improved balance, improved postural control, increased strength and ability to compensate for deficits.  Patient to discharge at an ambulatory level [name].   Patient's care partner is independent to provide the necessary physical assistance at discharge. Pt's daughter in law was present for and completed family education training.   Reasons goals not met: Pt did not meet 150 ft gait goal as she will require a w/c for longer distances. She will be ambulatory at home for short distances, min assist level walking 30-28f at a time with RW.   Recommendation:  Patient will benefit from ongoing skilled PT services in home health setting to continue to advance safe functional mobility, address ongoing impairments in balance, strength, gait, and minimize fall risk.  Equipment: RW and w/c  Reasons for discharge: treatment goals met  Patient/family agrees with progress made and goals achieved: Yes   PT Treatment Interventions: Pt seated in w/c upon PT arrival, agreeable to therapy tx and denies pain. Pt transported to the gym in w/c. Orthotist present for orthotic fitting. Pt received personal AFO and ambulated x 30 ft and x 65 ft with RW and min assist with L AFO and heel wedge added, pt continues to demonstrate R LE vaulting with L LE swing. Pt worked on standing balance without using AD, emphasis on postural control. Pt performed squat pivot transfers throughout session with supervision and stand pivots with CGA. Pt worked on L hip flexor activation and swing phase to perform L LE stepping in place, RW for UE support. Pt performed mini squats this session working on L knee control and symmetric  weightbearing without AD, x 15 with min assist. Pt worked on L LE knee control without AD while performing R LE stepping in place, min assist. Pt transferred back to w/c with supervision squat pivot and propelled w/c back to room mod I using B UEs. Pt transferred to bed and doffed clothing, donned gown with supervision. Bed mobility independent, left supine with needs in reach and chair alarm set.   PT Discharge Precautions/Restrictions Precautions Precautions: Fall Precaution Comments: L LE numbness, weakness Restrictions Weight Bearing Restrictions: No Vital Signs Therapy Vitals Temp: 98.3 F (36.8 C) Temp Source: Oral Pulse Rate: 70 Resp: 19 BP: (!) 95/57 Patient Position (if appropriate): Sitting Oxygen Therapy O2 Device: Room Air Pain   denies pain Cognition Overall Cognitive Status: Within Functional Limits for tasks assessed Arousal/Alertness: Awake/alert Orientation Level: Oriented X4 Attention: Focused;Sustained Focused Attention: Appears intact Sustained Attention: Appears intact Safety/Judgment: Appears intact Sensation Sensation Light Touch: Impaired by gross assessment Peripheral sensation comments: Impaired L LE light touch throughout extremity; impaired R LE light touch sensation on plantar surface of foot otherwise intact Light Touch Impaired Details: Impaired RLE;Impaired LLE Proprioception: Impaired by gross assessment Proprioception Impaired Details: Impaired LLE Coordination Gross Motor Movements are Fluid and Coordinated: No Fine Motor Movements are Fluid and Coordinated: No Coordination and Movement Description: impaired coordination L LE with compensatory patterns secondary to weakness Motor  Motor Motor: Other (comment) Motor - Discharge Observations: Still with LLE paresis  Mobility Bed Mobility Bed Mobility: Supine to Sit;Sit to Supine Rolling Right: Supervision/verbal cueing Rolling Left: Supervision/Verbal cueing Supine to Sit:  Supervision/Verbal cueing Sit to  Supine: Supervision/Verbal cueing Transfers Transfers: Stand to Sit;Sit to Stand Sit to Stand: Supervision/Verbal cueing Stand to Sit: Supervision/Verbal cueing Stand Pivot Transfers: Contact Guard/Touching assist Transfer (Assistive device): Rolling walker Locomotion  Gait Ambulation: Yes Gait Assistance: Minimal Assistance - Patient > 75% Assistive device: Rolling walker Gait Gait: Yes Gait Pattern: Impaired Gait Pattern: Left genu recurvatum;Left hip hike;Left circumduction;Decreased weight shift to left;Decreased step length - right Gait velocity: decreased Stairs / Additional Locomotion Stairs: Yes Stairs Assistance: Minimal Assistance - Patient > 75% Stair Management Technique: Two rails Number of Stairs: 4 Height of Stairs: 6 Wheelchair Mobility Wheelchair Mobility: Yes Wheelchair Assistance: Independent with Camera operator: Both upper extremities Wheelchair Parts Management: Independent  Trunk/Postural Assessment  Cervical Assessment Cervical Assessment: Within Scientist, physiological Assessment: Within Functional Limits Lumbar Assessment Lumbar Assessment: Within Functional Limits Postural Control Postural Limitations: impaired  Balance Dynamic Sitting Balance Dynamic Sitting - Level of Assistance: 6: Modified independent (Device/Increase time) Static Standing Balance Static Standing - Level of Assistance: 5: Stand by assistance Dynamic Standing Balance Dynamic Standing - Level of Assistance: 4: Min assist Extremity Assessment      RLE Assessment RLE Assessment: Within Functional Limits LLE Assessment LLE Assessment: Exceptions to [facility] LLE Strength Left Hip Flexion: 2/5 Left Hip ABduction: 2+/5 Left Hip ADduction: 2+/5 Left Knee Flexion: 2-/5 Left Knee Extension: 3-/5 Left Ankle Dorsiflexion: 0/5 Left Ankle Plantar Flexion: 3-/5    Susan Hodge, PT,  DPT 08/17/2018, 4:32 PM

## 2018-08-17 NOTE — Discharge Summary (Signed)
Physician Discharge Summary  Patient ID: Susan Hodge MRN: [id number] DOB/AGE: Apr 14, 1951 68 y.o.  Admit date: 08/03/2018 Discharge date: 08/18/2018  Discharge Diagnoses:  Active Problems:   Myelitis ([place])   Acute blood loss anemia   Hypoalbuminemia due to protein-calorie malnutrition (HCC)   Elevated BUN   Postoperative pain   Chronic pain syndrome   Neurogenic bowel   Neurogenic bladder   Anxiety state   AKI (acute kidney injury) ([place]) acute DVT left gastrocnemius vein Acute blood loss anemia  Discharged Condition: stable  Significant Diagnostic Studies: Mr Jeri Cos And Wo Contrast  Result Date: 07/31/2018 CLINICAL DATA:  Myelopathy. Left leg weakness and numbness. Urinary incontinence. EXAM: MRI HEAD WITHOUT AND WITH CONTRAST TECHNIQUE: Multiplanar, multiecho pulse sequences of the brain and surrounding structures were obtained without and with intravenous contrast. CONTRAST:  5 mL Gadavist COMPARISON:  None. FINDINGS: Brain: No acute infarct, mass, midline shift, or extra-axial fluid collection is identified. There is a small to moderate-sized chronic right MCA infarct involving the frontal operculum and insula with a small amount of hemosiderin staining. Small foci of T2 hyperintensity scattered elsewhere in the cerebral white matter bilaterally, most notably in the left corona radiata, are at most mildly advanced for age. No abnormal brain parenchymal or meningeal enhancement is identified. There is mild global cerebral atrophy. Vascular: Major intracranial vascular flow voids are preserved. Skull and upper cervical spine: Unremarkable bone marrow signal. Sinuses/Orbits: Unremarkable orbits. Mild circumferential mucosal thickening and moderate volume fluid in the left maxillary sinus which appears small. Trace right mastoid effusion. Other: Diffuse nodularity/microcystic changes throughout both parotid glands. IMPRESSION: 1. No acute intracranial abnormality. 2. Chronic right MCA  infarct. 3. Cerebral white matter T2 signal changes, nonspecific but compatible with mild chronic small vessel ischemic disease. 4. Diffuse nodularity/microcystic changes in the parotid gland suggestive of chronic inflammation or autoimmune disease. Electronically Signed   By: Logan Bores M.D.   On: 07/31/2018 20:13   Mr Thoracic Spine Wo Contrast  Result Date: 07/30/2018 CLINICAL DATA:  Urinary incontinence. Diminished sensation of the left leg. EXAM: MRI THORACIC SPINE WITHOUT CONTRAST TECHNIQUE: Multiplanar, multisequence MR imaging of the thoracic spine was performed. No intravenous contrast was administered. COMPARISON:  None. FINDINGS: Alignment:  Normal Vertebrae: No fracture or primary bone lesion. Cord: Multifocal abnormal cord signal with increased T2 within the central cord. This is most notable at the lower cervical region, T4 and T5, and T9-T11, but is present to a lesser degree in the intervening cord. There is no cord compression. Paraspinal and other soft tissues: Negative Disc levels: No significant degenerative disease. At T5-6 there is a shallow right posterolateral disc protrusion with its does not compress the neural structures. Minor disc bulges at T9-10 and T11-12. IMPRESSION: Abnormal T2 signal affecting the spinal cord in a patchy fashion from the lower cervical region down to the upper T11 level. Involvement appears to primarily affect the central cord, without lateralization. The differential diagnosis is not specific. This does not appear to be focal syringomyelia. Multifocal intramedullary tumor, ischemic change of the spinal cord, demyelinating disease and vitamin-B deficiency are all theoretically possible. Electronically Signed   By: Nelson Chimes M.D.   On: 07/30/2018 17:31   Mr Lumbar Spine Wo Contrast  Result Date: 07/26/2018 CLINICAL DATA:  [place] previously with low back pain and left leg pain. EXAM: MRI LUMBAR SPINE WITHOUT CONTRAST TECHNIQUE: Multiplanar, multisequence MR  imaging of the lumbar spine was performed. No intravenous contrast was administered. COMPARISON:  None.  FINDINGS: Segmentation:  5 lumbar type vertebral bodies. Alignment: Mild curvature convex to the left. 2 mm retrolisthesis L1-2. 2 mm anterolisthesis L3-4. Vertebrae:  No fracture or primary bone lesion. Conus medullaris and cauda equina: Conus extends to the L1 level. Conus and cauda equina appear normal. Paraspinal and other soft tissues: Negative Disc levels: T12-L1: Mild noncompressive disc bulge. L1-2: 2 mm retrolisthesis. Mild bulging of the disc. No compressive stenosis. L2-3: Mild bulging of the disc.  No compressive stenosis. L3-4: Bilateral facet arthropathy with some edema. 2 mm of anterolisthesis because of this. Bulging of the disc. Stenosis of the lateral recesses left more than right. Some potential for neural compression, particularly on the left. Mild left foraminal to extraforaminal encroachment by bulging disc material could focally irritate the left L3 nerve. L4-5: Endplate osteophytes and bulging of the disc. Bilateral facet and ligamentous hypertrophy. Stenosis of both lateral recesses that could possibly cause neural compression on either or both sides. The facet arthritis could also contribute to back pain. L5-S1: Mild bulging of the disc. Mild facet and ligamentous hypertrophy. Mild narrowing of the left subarticular lateral recess but without visible compression of the transitioning S1 nerve. IMPRESSION: No evidence of fracture related to a recent fall. L3-4: Bilateral facet arthropathy allowing 2 mm of anterolisthesis. Some edema of the facet joints. Bulging of the disc more towards the left. Narrowing of the lateral recesses left more than right and mild left foraminal to extraforaminal encroachment by bulging disc material. Findings at this level could certainly be associated with back pain. There be some potential for neural compression or irritation particularly in the left lateral  recess and left foraminal region. L4-5: Endplate osteophytes and bulging of the disc. Facet degeneration and hypertrophy. Stenosis of both subarticular lateral recesses. Neural compression could occur on either or both sides. The facet arthritis could contribute to back pain. L5-S1: Bulging of the disc. Mild facet hypertrophy on the left. Mild narrowing of the subarticular lateral recess but without visible compression of the transitioning left S1 nerve Electronically Signed   By: Nelson Chimes M.D.   On: 07/26/2018 18:22   Mr Cervical Spine W Or Wo Contrast  Result Date: 07/31/2018 CLINICAL DATA:  Myelopathy. Pain after a fall earlier in the month. Left leg weakness and numbness. Urinary incontinence. History of lupus. EXAM: MRI CERVICAL SPINE WITHOUT AND WITH CONTRAST TECHNIQUE: Multiplanar and multiecho pulse sequences of the cervical spine, to include the craniocervical junction and cervicothoracic junction, were obtained without and with intravenous contrast. CONTRAST:  5 mL Gadavist COMPARISON:  Thoracic spine MRI 07/30/2018 FINDINGS: Intermittent moderate motion artifact on axial sequences. Alignment: Cervical spine straightening.  No listhesis. Vertebrae: No fracture, suspicious osseous lesion, or significant marrow edema. Cord: Multifocal T2 hyperintensity in the cervical spinal cord including a 12 mm enhancing lesion in the left hemicord at C3-4 and a nearly 2 cm minimally enhancing lesion in the right and central aspects of the cord at C7. Lower level nonenhancing T2 hyperintensity elsewhere in the cord including in the upper thoracic spine and on the right at C2. Posterior Fossa, vertebral arteries, paraspinal tissues: Microcystic changes in the parotid glands as reported on brain MRI. Preserved vertebral artery flow voids. Disc levels: C2-3: Negative. C3-4: Negative. C4-5: Mild disc space narrowing. Mild disc bulging and minimal uncovertebral spurring without significant stenosis. C5-6: Mild disc  space narrowing. Mild disc bulging and uncovertebral spurring without significant stenosis. C6-7: Moderate disc space narrowing. Disc bulging, a small left posterolateral disc protrusion, and  uncovertebral spurring result in borderline to mild right and mild-to-moderate left neural foraminal stenosis without significant spinal stenosis. C7-T1: Shallow right paracentral disc protrusion without stenosis. IMPRESSION: 1. Multifocal T2 signal abnormality in the cervical spinal cord including enhancing lesions at C3-4 and C7. The appearance is nonspecific, however lupus myelitis, other inflammatory or infectious myelitis, and demyelinating disease are considerations. Tumor and ischemia are felt to be unlikely. 2. Cervical disc degeneration greatest at C6-7 where there is mild-to-moderate left neural foraminal stenosis. No significant spinal stenosis. Electronically Signed   By: Logan Bores M.D.   On: 07/31/2018 20:52   US Arterial Abi (screening Lower Extremity)  Result Date: 07/22/2018 CLINICAL DATA:  Cold left leg.  Left lower extremity pain. EXAM: NONINVASIVE PHYSIOLOGIC VASCULAR STUDY OF BILATERAL LOWER EXTREMITIES TECHNIQUE: Evaluation of both lower extremities were performed at rest, including calculation of ankle-brachial indices with single level Doppler, pressure and pulse volume recording. COMPARISON:  None. FINDINGS: Right ABI:  1.02 Left ABI:  0.96 Right Lower Extremity:  Normal arterial waveforms at the ankle. Left Lower Extremity:  Normal arterial waveforms at the ankle. 1.0-1.4 Normal IMPRESSION: Normal resting ankle-brachial indices. Electronically Signed   By: Markus Daft M.D.   On: 07/22/2018 16:49   Vas [place] Lower Extremity Venous (dvt)  Result Date: 08/02/2018  Lower Venous Study Indications: Pain.  Performing Technologist: June Leap RDMS, RVT  Examination Guidelines: A complete evaluation includes B-mode imaging, spectral Doppler, color Doppler, and power Doppler as needed of all accessible  portions of each vessel. Bilateral testing is considered an integral part of a complete examination. Limited examinations for reoccurring indications may be performed as noted.  Right Venous Findings: +---+---------------+---------+-----------+----------+-------+    CompressibilityPhasicitySpontaneityPropertiesSummary +---+---------------+---------+-----------+----------+-------+ CFVFull           Yes      Yes                          +---+---------------+---------+-----------+----------+-------+  Left Venous Findings: +---------+---------------+---------+-----------+----------+-------+          CompressibilityPhasicitySpontaneityPropertiesSummary +---------+---------------+---------+-----------+----------+-------+ CFV      Full           Yes      Yes                          +---------+---------------+---------+-----------+----------+-------+ SFJ      Full                                                 +---------+---------------+---------+-----------+----------+-------+ FV Prox  Full                                                 +---------+---------------+---------+-----------+----------+-------+ FV Mid   Full                                                 +---------+---------------+---------+-----------+----------+-------+ FV DistalFull                                                 +---------+---------------+---------+-----------+----------+-------+  PFV      Full                                                 +---------+---------------+---------+-----------+----------+-------+ POP      Full           Yes      Yes                          +---------+---------------+---------+-----------+----------+-------+ PTV      Full                                                 +---------+---------------+---------+-----------+----------+-------+ PERO     Full                                                  +---------+---------------+---------+-----------+----------+-------+ Gastroc  None                                         Acute   +---------+---------------+---------+-----------+----------+-------+    Summary: Right: No evidence of common femoral vein obstruction. Left: Findings consistent with acute deep vein thrombosis involving the left gastrocnemius vein. No cystic structure found in the popliteal fossa.  *See table(s) above for measurements and observations. Electronically signed by Ruta Hinds MD on 08/02/2018 at 8:49:58 PM.    Final     Labs:  Basic Metabolic Panel: No results for input(s): NA, K, CL, CO2, GLUCOSE, BUN, CREATININE, CALCIUM, MG, PHOS in the last 168 hours.  CBC: No results for input(s): WBC, NEUTROABS, HGB, HCT, MCV, PLT in the last 168 hours.  CBG: No results for input(s): GLUCAP in the last 168 hours.  Family history. Father with CVA. Denies any hypertension or diabetes mellitus  Brief HPI:    Susan Hodge is a 68 year old right-handed female with history of migraine headaches maintained on valproic acid, fibromyalgia, lupus and tobacco abuse. She lives alone one level home independent prior to admission. Presented 07/31/2018 with recent fall 2 weeks ago landing on her buttocks noting progressive lower extremity weakness. Denied any back or bladder disturbances. MRI of the brain negative for acute changes however there was a chronic right MCA infarction. CT lumbar thoracic and cervical spine showed multifocal T2 signal abnormality and cervical spinal cord including enhancing lesion at C3-4 and 5 as well as C7. Cervical disc degeneration C6-7. Lumbar puncture completed WBC 14 lymphs 83, glucose 64. Venous Dopplers lower extremity showed acute DVT left gastrocnemius vein. Placed on therapeutic dose of Lovenox transition to Eliquis 08/03/2018. Neurology consulted and workup for myelitis at C3-4 and C7 placed on IV Solu-Medrol through 08/04/2018. Therapy evaluations  completed patient was admitted for a comprehensive rehabilitation program.  Hospital Course: AIZLYNN DIGILIO was admitted to rehab 08/03/2018 for inpatient therapies to consist of PT, ST and OT at least three hours five days a week. Past admission physiatrist, therapy team and rehab RN have worked together to provide customized collaborative inpatient rehab.  Pertaining to Susan Hodge C3-4 and C7 myelitis she completed course of IV Solu-Medrol. She was to follow up outpatient with neurology services Dr. Felecia Shelling. Noted hospital course acute DVT left gastrocnemius vein she was transitioned from Lovenox to Eliquis without bleeding episodes. Chronic pain syndrome history of fibromyalgia migraine headaches she remained on valproic acid 250 mg twice daily as well as Lyrica scheduled and oxycodone as needed. Mood stabilization with Xanax as needed emotional support provided. She did have a history of tobacco abuse receiving counseling in regard to cessation of nicotine products. Protonix ongoing for history of GERD. She denied any nausea or vomiting. Neurogenic bowel and bladder with timed toileting schedule PVRs relatively unremarkable trial of Enablex initiated for 09/22/2018. Blood pressures overall remained controlled. She would follow-up outpatient with her primary M.D.  Physical exam. Blood pressure 92/46 pulse 65 temp 98 respirations 18 oxygen saturations 99% room air Constitutional. Alert and oriented well-developed well-nourished HEENT. Normocephalic atraumatic Eyes. Pupils round and reactive to light EOMs normal no nystagmus Neck is supple nontender normal range of motion no tracheal deviation no thyromegaly Cardiovascular normal rate regular rhythm without murmur or rub heard Respiratory. normal breath sounds normal no respiratory distress without wheeze GI. Soft bowel sounds normal no distention nontender without rebound Musculoskeletal. Normal range of motion Neurological. Alert and oriented upper  extremity bilateral 5 out of 5 right lower extremity 4+ out of 5 proximal to distal. Left lower extremity 3 minus hip flexors, 3 out of 5 knee extension, 2 out of 5 knee flexion, 2 out of 5 ankle plantar flexion trace ankle dorsiflexion. Decreased sensation left foot.  Rehab course: During patient's stay in rehab weekly team conferences were held to monitor patient's progress, set goals and discuss barriers to discharge. At admission, patient required moderate assist to ambulate 15 feet with rolling walker, minimal guard sit to stand, minimal guard supine to sit as well as bed mobility. Minimal guard sitting for upper body bathing, minimal assist lower body bathing, minimal guard upper body dressing and minimal assist lower body dressing. Toilet transfers minimal assist. Functional mobility during ADLs minimal assistance.  She  has had improvement in activity tolerance, balance, postural control as well as ability to compensate for deficits. He/She has had improvement in functional use RUE/LUE  and RLE/LLE as well as improvement in awareness.working with energy conservation techniques. Patient performed stand pivot wheelchair toilet contact-guard assist with rolling walker. Propels her wheelchair to the gymnasium with supervision. Ambulates 30 feet 2 rolling walker minimal assist AFO recommendations. Therapist educated patient and caregiver on the use of AFO. Patient worked on ambulation within the rehabilitation apartment and furniture transfers with rolling walker and assistance from caregiver. Patient performed stand pivot car transfers with rolling walker and minimal assistance. Perform squat pivot wheelchair to mat with supervision. Patient transferred to supine with supervision.in regards to toileting transferred from wheelchair to toilet over commode with minimal cueing for safety awareness. Performed toileting hygiene with minimal assistance. Caregiver ambulating with patient to the bathroom contact  guard assist with full family teaching completed and plan discharge to home.       Disposition:  discharged to home   Diet:regular diet  Special Instructions:no smoking or driving  Medications at discharge 1. Tylenol as needed 2. Xanax 0.5 mg 3 times a day as needed 3.Eliquis 5 mg twice a day 4. Enablex 7.5 mg daily 5. Claritin 10 mg by mouth daily 6. Oxycodone 7.5-325 mg 1 tablet every 8 hours as needed pain 7. Protonix  40 mg by mouth twice a day 8. MiraLAX as needed 9. Lyrica 50 mg by mouth 3 times a day 10. Valproic acid 250 mg by mouth daily at bedtime 11. Vitamin B12 250 g by mouth daily 12. Vitamin D 50,000 units every Monday 13 vitamin D 1000 units by mouth daily  Discharge Instructions    Ambulatory referral to Physical Medicine Rehab   Complete by:  As directed    Moderate complexity follow up 1-2 weeks cervical myelitis      Follow-up Information    Kirsteins, Luanna Salk, MD Follow up.   Specialty:  Physical Medicine and Rehabilitation Why:  office to call for appointment Contact information: [place] [address] [phone]        Britt Bottom, MD Follow up.   Specialty:  Neurology Why:  call for appointment Contact information: [place] [address] [phone]           Signed: Cathlyn Parsons 08/17/2018, 5:43 AM

## 2018-08-17 NOTE — Progress Notes (Signed)
PHYSICAL MEDICINE & REHABILITATION PROGRESS NOTE  Subjective/Complaints:  No issues overnite   ROS: Denies CP, shortness of breath, nausea, vomiting, diarrhea.  Objective: Vital Signs: Blood pressure (!) 98/48, pulse 69, temperature 98 F (36.7 C), temperature source Oral, resp. rate 18, height 5' (1.524 m), SpO2 100 %. No results found. No results for input(s): WBC, HGB, HCT, PLT in the last 72 hours. No results for input(s): NA, K, CL, CO2, GLUCOSE, BUN, CREATININE, CALCIUM in the last 72 hours.  Physical Exam: BP (!) 98/48   Pulse 69   Temp 98 F (36.7 C) (Oral)   Resp 18   Ht 5' (1.524 m)   SpO2 100%   BMI 24.58 kg/m  Gen: NAD.  Vital signs reviewed. HENT: Normocephalic.  Atraumatic. Eyes: EOMI. No discharge.  Cardio: No JVD. Pulm: Effort normal, lungs clear no wheezes or rales Neuro: Alert and oriented Musc: No edema or tenderness in lower extremities. Neurological: She isalertand oriented Motor:  Right lower extremity: 4-4+/5 proximal distal, unchanged Left lower extremity: Hip flexion, knee extension 2+/5, ankle dorsiflexion 1/5, unchanged Skin: Warm and dry.  Intact. Psychiatric: Normal mood.  Normal affect.  Assessment/Plan: 1. Functional deficits secondary to C3-4, C7 myelitis which require 3+ hours per day of interdisciplinary therapy in a comprehensive inpatient rehab setting.  Physiatrist is providing close team supervision and 24 hour management of active medical problems listed below.  Physiatrist and rehab team continue to assess barriers to discharge/monitor patient progress toward functional and medical goals  Care Tool:  Bathing    Body parts bathed by patient: Right arm, Chest, Left arm, Right upper leg, Left upper leg, Right lower leg, Abdomen, Front perineal area, Left lower leg, Face, Buttocks     Body parts n/a: Front perineal area   Bathing assist Assist Level: Supervision/Verbal cueing     Upper Body  Dressing/Undressing Upper body dressing   What is the patient wearing?: Pull over shirt, Bra    Upper body assist Assist Level: Set up assist    Lower Body Dressing/Undressing Lower body dressing      What is the patient wearing?: Incontinence brief, Underwear/pull up     Lower body assist Assist for lower body dressing: Contact Guard/Touching assist     Toileting Toileting    Toileting assist Assist for toileting: Minimal Assistance - Patient > 75%     Transfers Chair/bed transfer  Transfers assist     Chair/bed transfer assist level: Supervision/Verbal cueing     Locomotion Ambulation   Ambulation assist      Assist level: Minimal Assistance - Patient > 75% Assistive device: Walker-rolling Max distance: 30 ft   Walk 10 feet activity   Assist     Assist level: Minimal Assistance - Patient > 75% Assistive device: Walker-rolling   Walk 50 feet activity   Assist Walk 50 feet with 2 turns activity did not occur: Safety/medical concerns  Assist level: Minimal Assistance - Patient > 75% Assistive device: Walker-rolling    Walk 150 feet activity   Assist Walk 150 feet activity did not occur: Safety/medical concerns         Walk 10 feet on uneven surface  activity   Assist Walk 10 feet on uneven surfaces activity did not occur: Safety/medical concerns         Wheelchair     Assist Will patient use wheelchair at discharge?: Yes Type of Wheelchair: Manual    Wheelchair assist level: Supervision/Verbal cueing Max wheelchair distance: 116ft  Wheelchair 50 feet with 2 turns activity    Assist        Assist Level: Supervision/Verbal cueing   Wheelchair 150 feet activity     Assist Wheelchair 150 feet activity did not occur: Safety/medical concerns   Assist Level: Supervision/Verbal cueing      Medical Problem List and Plan: 1.Left side weakness and hemisensory deficitssecondary to C3-4, C7  myelitis    Continue CIR, PT, OT, plan D/C in am   PRAFO ordered 2. Antithrombotics: DVT/anticoagulation:Acute DVT left gastrocnemius vein.Transitioned from Lovenox to Eliquis Antiplatelet therapy: N/A 3. Pain Management/fibromyalgia/migraine headaches:Valproic acid 250 mg twice a day Lyrica 50 mg 3 times a day,oxycodone as needed  Relatively controlled on 4/14   4. Mood:Xanax 0.5 mg 3 times a day as needed  Severe anxiety at present, will consider Lexapro pending response to therapies- anxiety improving -antipsychotic agents: N/A 5. Neuropsych: This patientiscapable of making decisions on herown behalf. 6. Skin/Wound Care:Routine skin checks 7. Fluids/Electrolytes/Nutrition:Routine in and out's  8. GERD. Protonix 9. Tobacco abuse. Counseling 10.  Hypoalbuminemia  Supplement initiated on 4/1 11.  Acute blood loss anemia- no obvious blood loss, on Eliquis  Hemoglobin 11.9 on 4/1, stable 11.8 on 4/6    Continue to monitor 12.  Neurogenic bowel/bladder  Timed toileting  PVRs relatively unremarkable  Trial Enablex started on 4/5   14.  BPs low normal- no dizziness Vitals:   08/16/18 2002 08/17/18 0555  BP: (!) 89/47 (!) 98/48  Pulse: 73 69  Resp:  18  Temp:  98 F (36.7 C)  SpO2: 99% 100%    No antihypertensive meds 15. 3rd generation contact with Covid pt but not within 6 ft.Sons have not demonstrated symptoms and have ~1 more week of quarantine  Will monitor for sx but not high risk based on exposure hx LOS: 14 days A FACE TO FACE EVALUATION WAS PERFORMED  Charlett Blake 08/17/2018, 6:58 AM

## 2018-08-17 NOTE — Progress Notes (Signed)
Physical Therapy Session Note  Patient Details  Name: Susan Hodge MRN: [id number] Date of Birth: 1950-09-17  Today's Date: 08/17/2018 PT Individual Time: 1137-1231 PT Individual Time Calculation (min): 54 min   Short Term Goals: Week 2:  PT Short Term Goal 1 (Week 2): STG=LTG due to ELOS  Skilled Therapeutic Interventions/Progress Updates:   Pt received sitting in w/c and agreeable to therapy session. Propelled w/c ~157ft using B UEs mod-I with pt demonstrating proper use of w/c parts including breaks and leg rests without cuing. Pt ambulated ~63ft using RW with CGA and occasional min A for  LLE management. Performed repeated sit<>stand x10 from mat table with mirror feedback for increased L LE weightbearing - manual facilitation and multimodal cuing for increased L weight shifting with min assist for balance and L LE knee control. Performed 4 sets of repeated step-up onto 2" step with L LE (no AFO) focusing on motor planning and muscle coordination/timing of hip and knee extension to accept weightbearing through L LE and step-up - manual facilitation for weight shifting, L LE hip and knee extension with mirror feedback for improved trunk upright - pt progressed from requiring L UE support on therapist and min to mod assist for L LE knee control and standing balance to no UE support and min assist for balance with improvement noted in quadriceps and gluteal muscle contraction and timing. Pt transported to ortho gym in w/c for time management and energy conservation. Pt performed ambulatory car transfer using RW, no AFO, with CGA for steadying. Pt performed ~230ft of BUE w/c propulsion to room mod-I. Pt left sitting in w/c with needs in reach and seat belt alarm on.  Therapy Documentation Precautions:  Precautions Precautions: Fall Precaution Comments: L LE numbness, weakness Restrictions Weight Bearing Restrictions: No RLE Weight Bearing: Weight bearing as tolerated RLE Partial Weight  Bearing Percentage or Pounds: (NWB)  Pain: Reports 7/10 pain along L buttock but states this is "normal" for her and it does not limit her participation with therapy - rest breaks provided throughout for pain management.   Therapy/Group: Individual Therapy  Tawana Scale, PT, DPT 08/17/2018, 11:57 AM

## 2018-08-17 NOTE — Progress Notes (Signed)
Occupational Therapy Discharge Summary  Patient Details  Name: Susan Hodge MRN: [id number] Date of Birth: 1950-11-08  Today's Date: 08/17/2018 OT Individual Time: 9935-7017 OT Individual Time Calculation (min): 71 min   Session Note:  Pt worked on bathing and dressing during session.  Min guard assist for transfer from the EOB to the toilet and the shower with use of the RW and no AFO in place.  She was able to complete all aspects of toileting at the same level with bathing completed sit to stand with supervision.  Dressing was performed at the sink after transfer out of the shower at min guard.  She completed all aspects of UB dressing and grooming at supervision level with min assist needed for donning the left AFO and shoe.  Once ADL was completed, she was given a handout and educated on BUE therex with use of the medium resistance therapy band.  She completed 1 set of 5-10 repetitions for shoulder flexion, shoulder horizontal abduction, elbow flexion, and elbow extension.  Pt was able to return demonstrate with min demonstrational cueing for technique.  Finished session with pt in the wheelchair with call button and phone in reach and safety belt in place.     Patient has met 10 of 10 long term goals due to improved activity tolerance, improved balance, ability to compensate for deficits and improved coordination.  Patient to discharge at overall Supervision level.  Patient's care partner is independent to provide the necessary physical assistance at discharge.    Reasons goals not met: NA  Recommendation:  Patient will benefit from ongoing skilled OT services in home health setting to continue to advance functional skills in the area of BADL, iADL and Reduce care partner burden.  Pt continues to demonstrate LLE weakness which results in the need to have min guard to supervision for most selfcare tasks and functional transfers with use of the RW for support.  In additiion, she needs min  assist for donning the left AFO and shoe.  Feel she can be supervision level if her shoe was slightly bigger.  Recommend continued HHOT to progress to modified independent level for all selfcare tasks, transfers, and home management so she can return to PLOF.    Equipment: 3:1 and tub bench  Reasons for discharge: treatment goals met and discharge from hospital  Patient/family agrees with progress made and goals achieved: Yes  OT Discharge Precautions/Restrictions  Precautions Precautions: Fall Precaution Comments: L LE numbness, weakness Restrictions Weight Bearing Restrictions: No   Pain Pain Assessment Pain Scale: Faces Pain Score: 0-No pain Faces Pain Scale: Hurts a little bit Pain Type: Acute pain Pain Location: Buttocks Pain Orientation: Left Pain Descriptors / Indicators: Discomfort Pain Onset: On-going Pain Intervention(s): Repositioned ADL ADL Eating: Independent Where Assessed-Eating: Wheelchair Grooming: Modified independent Where Assessed-Grooming: Sitting at sink, Wheelchair Upper Body Bathing: Modified independent Where Assessed-Upper Body Bathing: Multimedia programmer, Chair Lower Body Bathing: Supervision/safety Where Assessed-Lower Body Bathing: Chair, Administrator, sports Dressing: Independent Where Assessed-Upper Body Dressing: Sitting at sink Lower Body Dressing: Minimal assistance Where Assessed-Lower Body Dressing: Wheelchair Toileting: Contact guard Where Assessed-Toileting: Medical laboratory scientific officer: Therapist, music Method: Arts development officer: Radiographer, therapeutic: Metallurgist Method: Optometrist: Facilities manager: Curator Method: Heritage manager: Radio broadcast assistant Vision Baseline Vision/History: Wears glasses Wears Glasses: At all times Patient Visual Report: No change from  baseline Vision Assessment?: No apparent visual  deficits Perception  Perception: Within Functional Limits Praxis Praxis: Intact Cognition Overall Cognitive Status: Within Functional Limits for tasks assessed Arousal/Alertness: Awake/alert Orientation Level: Oriented X4 Attention: Focused;Sustained Focused Attention: Appears intact Sustained Attention: Appears intact Memory: Impaired Memory Impairment: Retrieval deficit;Decreased recall of new information Awareness: Appears intact Problem Solving: Appears intact Safety/Judgment: Appears intact Comments: Pt with decreased memory of reaching back to surfaces when sitting and continues to keep hands on the walker at times. Sensation Sensation Light Touch: Appears Intact Hot/Cold: Appears Intact Proprioception: Appears Intact Stereognosis: Appears Intact Additional Comments: sensation intact in BUEs Coordination Gross Motor Movements are Fluid and Coordinated: Yes Fine Motor Movements are Fluid and Coordinated: Yes Coordination and Movement Description: coordination [place] for BUEs but still impaired in the LLE grossly Motor  Motor Motor: Other (comment)(LLE paresis) Motor - Discharge Observations: Still with LLE paresis Mobility  Bed Mobility Bed Mobility: Supine to Sit Supine to Sit: Supervision/Verbal cueing Transfers Sit to Stand: Supervision/Verbal cueing Stand to Sit: Supervision/Verbal cueing  Trunk/Postural Assessment  Cervical Assessment Cervical Assessment: Within Functional Limits Thoracic Assessment Thoracic Assessment: Within Functional Limits Lumbar Assessment Lumbar Assessment: Within Functional Limits  Balance Balance Balance Assessed: Yes Static Sitting Balance Static Sitting - Balance Support: Feet supported Static Sitting - Level of Assistance: 7: Independent Dynamic Sitting Balance Dynamic Sitting - Balance Support: During functional activity Dynamic Sitting - Level of Assistance: 6: Modified  independent (Device/Increase time) Static Standing Balance Static Standing - Balance Support: During functional activity Static Standing - Level of Assistance: 5: Stand by assistance Dynamic Standing Balance Dynamic Standing - Balance Support: During functional activity Dynamic Standing - Level of Assistance: 4: Min assist(min guard ) Extremity/Trunk Assessment RUE Assessment RUE Assessment: Within Functional Limits LUE Assessment LUE Assessment: Within Functional Limits   Benson Porcaro OTR/L 08/17/2018, 11:56 AM

## 2018-08-18 DIAGNOSIS — M21372 Foot drop, left foot: Secondary | ICD-10-CM

## 2018-08-18 MED ORDER — OXYCODONE-ACETAMINOPHEN 7.5-325 MG PO TABS: 1.0000 | ORAL_TABLET | Freq: Three times a day (TID) | ORAL | 0 refills | Status: DC | PRN

## 2018-08-18 MED ORDER — PANTOPRAZOLE SODIUM 40 MG PO TBEC: 40.0000 mg | DELAYED_RELEASE_TABLET | Freq: Two times a day (BID) | ORAL | 0 refills | Status: DC

## 2018-08-18 MED ORDER — PREGABALIN 50 MG PO CAPS: 50.0000 mg | ORAL_CAPSULE | Freq: Three times a day (TID) | ORAL | 0 refills | Status: DC

## 2018-08-18 MED ORDER — DARIFENACIN HYDROBROMIDE ER 7.5 MG PO TB24: 7.5000 mg | ORAL_TABLET | Freq: Every day | ORAL | 1 refills | Status: DC

## 2018-08-18 MED ORDER — ACETAMINOPHEN 325 MG PO TABS: 650.0000 mg | ORAL_TABLET | Freq: Four times a day (QID) | ORAL | Status: AC | PRN

## 2018-08-18 MED ORDER — APIXABAN 5 MG PO TABS: 5.0000 mg | ORAL_TABLET | Freq: Two times a day (BID) | ORAL | 1 refills | Status: DC

## 2018-08-18 MED ORDER — VITAMIN D (ERGOCALCIFEROL) 1.25 MG (50000 UNIT) PO CAPS: 50000.0000 [IU] | ORAL_CAPSULE | ORAL | 0 refills | Status: AC

## 2018-08-18 MED ORDER — VALPROIC ACID 250 MG PO CAPS: 250.0000 mg | ORAL_CAPSULE | Freq: Every day | ORAL | 1 refills | Status: AC

## 2018-08-18 MED ORDER — ALPRAZOLAM 0.5 MG PO TABS: 0.5000 mg | ORAL_TABLET | Freq: Three times a day (TID) | ORAL | 0 refills | Status: DC | PRN

## 2018-08-18 NOTE — Progress Notes (Signed)
Pt. Received all equipment/discharge instructions. Family present at this time. Pt. Is ready to discharge home.

## 2018-08-18 NOTE — Progress Notes (Signed)
[place] PHYSICAL MEDICINE & REHABILITATION PROGRESS NOTE  Subjective/Complaints:  Ready for d/c   ROS: Denies CP, shortness of breath, nausea, vomiting, diarrhea.  Objective: Vital Signs: Blood pressure 101/70, pulse 81, temperature 97.9 F (36.6 C), temperature source Oral, resp. rate 18, height 5' (1.524 m), SpO2 100 %. No results found. No results for input(s): WBC, HGB, HCT, PLT in the last 72 hours. No results for input(s): NA, K, CL, CO2, GLUCOSE, BUN, CREATININE, CALCIUM in the last 72 hours.  Physical Exam: BP 101/70 (BP Location: Right Arm)   Pulse 81   Temp 97.9 F (36.6 C) (Oral)   Resp 18   Ht 5' (1.524 m)   SpO2 100%   BMI 24.58 kg/m  Gen: NAD.  Vital signs reviewed. HENT: Normocephalic.  Atraumatic. Eyes: EOMI. No discharge.  Cardio: No JVD. Pulm: Effort normal, lungs clear no wheezes or rales Neuro: Alert and oriented Musc: No edema or tenderness in lower extremities. Neurological: She isalertand oriented Motor:  Right lower extremity: 4-4+/5 proximal distal, unchanged Left lower extremity: Hip flexion, knee extension 2+/5, ankle dorsiflexion 1/5, unchanged Skin: Warm and dry.  Intact. Psychiatric: Normal mood.  Normal affect.  Assessment/Plan: 1. Functional deficits secondary to C3-4, C7 myelitis  Stable for D/C today F/u PCP in 3-4 weeks F/u PM&R 2 weeks See D/C summary See D/C instructions Care Tool:  Bathing    Body parts bathed by patient: Right arm, Chest, Left arm, Right upper leg, Left upper leg, Right lower leg, Abdomen, Front perineal area, Left lower leg, Face, Buttocks     Body parts n/a: Front perineal area   Bathing assist Assist Level: Supervision/Verbal cueing     Upper Body Dressing/Undressing Upper body dressing   What is the patient wearing?: Pull over shirt, Bra    Upper body assist Assist Level: Set up assist    Lower Body Dressing/Undressing Lower body dressing      What is the patient wearing?:  Incontinence brief, Underwear/pull up     Lower body assist Assist for lower body dressing: Contact Guard/Touching assist     Toileting Toileting    Toileting assist Assist for toileting: Minimal Assistance - Patient > 75%     Transfers Chair/bed transfer  Transfers assist     Chair/bed transfer assist level: Supervision/Verbal cueing     Locomotion Ambulation   Ambulation assist      Assist level: Minimal Assistance - Patient > 75% Assistive device: Walker-rolling Max distance: 65 ft   Walk 10 feet activity   Assist     Assist level: Minimal Assistance - Patient > 75% Assistive device: Walker-rolling   Walk 50 feet activity   Assist Walk 50 feet with 2 turns activity did not occur: Safety/medical concerns  Assist level: Minimal Assistance - Patient > 75% Assistive device: Walker-rolling    Walk 150 feet activity   Assist Walk 150 feet activity did not occur: Safety/medical concerns(fatigue, L LE weakness)         Walk 10 feet on uneven surface  activity   Assist Walk 10 feet on uneven surfaces activity did not occur: Safety/medical concerns         Wheelchair     Assist Will patient use wheelchair at discharge?: Yes Type of Wheelchair: Manual    Wheelchair assist level: Independent Max wheelchair distance: 163ft    Wheelchair 50 feet with 2 turns activity    Assist        Assist Level: Independent   Wheelchair 150  feet activity     Assist Wheelchair 150 feet activity did not occur: Safety/medical concerns   Assist Level: Independent      Medical Problem List and Plan: 1.Left side weakness and hemisensory deficitssecondary to C3-4, C7 myelitis    D/C today  PRAFO ordered 2. Antithrombotics: DVT/anticoagulation:Acute DVT left gastrocnemius vein.Transitioned from Lovenox to Eliquis Antiplatelet therapy: N/A 3. Pain Management/fibromyalgia/migraine headaches:Valproic acid 250 mg twice a  day Lyrica 50 mg 3 times a day,oxycodone as needed  Relatively controlled on 4/14   4. Mood:Xanax 0.5 mg 3 times a day as needed  Severe anxiety at present, will consider Lexapro pending response to therapies- anxiety improving -antipsychotic agents: N/A 5. Neuropsych: This patientiscapable of making decisions on herown behalf. 6. Skin/Wound Care:Routine skin checks 7. Fluids/Electrolytes/Nutrition:Routine in and out's  8. GERD. Protonix 9. Tobacco abuse. Counseling 10.  Hypoalbuminemia  Supplement initiated on 4/1 11.  Acute blood loss anemia- no obvious blood loss, on Eliquis  Hemoglobin 11.9 on 4/1, stable 11.8 on 4/6    Continue to monitor 12.  Neurogenic bowel/bladder  Timed toileting  PVRs relatively unremarkable  Trial Enablex started on 4/5   14.  BPs low normal- no dizziness Vitals:   08/17/18 1923 08/18/18 0400  BP: (!) 96/56 101/70  Pulse: 79 81  Resp: 17 18  Temp: 98.5 F (36.9 C) 97.9 F (36.6 C)  SpO2: 100% 100%    No antihypertensive meds 15. 3rd generation contact with Covid pt but not within 6 ft.Sons have not demonstrated symptoms and have ~1 more week of quarantine  Will monitor for sx but not high risk based on exposure hx LOS: 15 days A FACE TO FACE EVALUATION WAS PERFORMED  Charlett Blake 08/18/2018, 7:33 AM

## 2018-08-19 ENCOUNTER — Telehealth: Payer: Self-pay | Admitting: Neurology

## 2018-08-19 ENCOUNTER — Telehealth: Payer: Self-pay

## 2018-08-19 DIAGNOSIS — R69 Illness, unspecified: Secondary | ICD-10-CM | POA: Diagnosis not present

## 2018-08-19 DIAGNOSIS — K592 Neurogenic bowel, not elsewhere classified: Secondary | ICD-10-CM | POA: Diagnosis not present

## 2018-08-19 DIAGNOSIS — G894 Chronic pain syndrome: Secondary | ICD-10-CM | POA: Diagnosis not present

## 2018-08-19 DIAGNOSIS — M329 Systemic lupus erythematosus, unspecified: Secondary | ICD-10-CM | POA: Diagnosis not present

## 2018-08-19 DIAGNOSIS — N319 Neuromuscular dysfunction of bladder, unspecified: Secondary | ICD-10-CM | POA: Diagnosis not present

## 2018-08-19 DIAGNOSIS — E8809 Other disorders of plasma-protein metabolism, not elsewhere classified: Secondary | ICD-10-CM | POA: Diagnosis not present

## 2018-08-19 DIAGNOSIS — M797 Fibromyalgia: Secondary | ICD-10-CM | POA: Diagnosis not present

## 2018-08-19 DIAGNOSIS — E43 Unspecified severe protein-calorie malnutrition: Secondary | ICD-10-CM | POA: Diagnosis not present

## 2018-08-19 DIAGNOSIS — G43909 Migraine, unspecified, not intractable, without status migrainosus: Secondary | ICD-10-CM | POA: Diagnosis not present

## 2018-08-19 DIAGNOSIS — M50323 Other cervical disc degeneration at C6-C7 level: Secondary | ICD-10-CM | POA: Diagnosis not present

## 2018-08-19 NOTE — Telephone Encounter (Signed)
Notified. 

## 2018-08-19 NOTE — Telephone Encounter (Signed)
Debbie, PT/[facility] called requesting verbal orders for HHPT 2wk3 and HHAide 1wk1, 2wk2. Orders approved and given per discharge summary. Debbie also wanted to inform medications are coming back adverse reaction Oxycodone and Tylenol, Lyrica and Depakote. Does patient need to continue on these medication?

## 2018-08-19 NOTE — Telephone Encounter (Signed)
Pt was taking these meds in hospital without reaction The Oxycodone is prn

## 2018-08-19 NOTE — Telephone Encounter (Signed)
It looks like there are openings on the 22 and 23 of april

## 2018-08-19 NOTE — Telephone Encounter (Signed)
Per Dr. Felecia Shelling, should see pt in office within the next few weeks. Use hr long [place] spot.

## 2018-08-19 NOTE — Telephone Encounter (Signed)
Hey Dr. Felecia Shelling. We received a referral from the hospital on pt for Myelitis. I wanted to check with you-what type of visit should be offered?

## 2018-08-20 DIAGNOSIS — G43909 Migraine, unspecified, not intractable, without status migrainosus: Secondary | ICD-10-CM | POA: Diagnosis not present

## 2018-08-20 DIAGNOSIS — E8809 Other disorders of plasma-protein metabolism, not elsewhere classified: Secondary | ICD-10-CM | POA: Diagnosis not present

## 2018-08-20 DIAGNOSIS — Z6823 Body mass index (BMI) 23.0-23.9, adult: Secondary | ICD-10-CM | POA: Diagnosis not present

## 2018-08-20 DIAGNOSIS — M50323 Other cervical disc degeneration at C6-C7 level: Secondary | ICD-10-CM | POA: Diagnosis not present

## 2018-08-20 DIAGNOSIS — M329 Systemic lupus erythematosus, unspecified: Secondary | ICD-10-CM | POA: Diagnosis not present

## 2018-08-20 DIAGNOSIS — I82402 Acute embolism and thrombosis of unspecified deep veins of left lower extremity: Secondary | ICD-10-CM | POA: Diagnosis not present

## 2018-08-20 DIAGNOSIS — Z299 Encounter for prophylactic measures, unspecified: Secondary | ICD-10-CM | POA: Diagnosis not present

## 2018-08-20 DIAGNOSIS — R69 Illness, unspecified: Secondary | ICD-10-CM | POA: Diagnosis not present

## 2018-08-20 DIAGNOSIS — G0491 Myelitis, unspecified: Secondary | ICD-10-CM | POA: Diagnosis not present

## 2018-08-20 DIAGNOSIS — K592 Neurogenic bowel, not elsewhere classified: Secondary | ICD-10-CM | POA: Diagnosis not present

## 2018-08-20 DIAGNOSIS — M797 Fibromyalgia: Secondary | ICD-10-CM | POA: Diagnosis not present

## 2018-08-20 DIAGNOSIS — N319 Neuromuscular dysfunction of bladder, unspecified: Secondary | ICD-10-CM | POA: Diagnosis not present

## 2018-08-20 DIAGNOSIS — E43 Unspecified severe protein-calorie malnutrition: Secondary | ICD-10-CM | POA: Diagnosis not present

## 2018-08-20 DIAGNOSIS — G894 Chronic pain syndrome: Secondary | ICD-10-CM | POA: Diagnosis not present

## 2018-08-24 ENCOUNTER — Encounter: Payer: Medicare HMO | Attending: Registered Nurse | Admitting: Registered Nurse

## 2018-08-24 ENCOUNTER — Telehealth: Payer: Self-pay | Admitting: Registered Nurse

## 2018-08-24 ENCOUNTER — Other Ambulatory Visit: Payer: Self-pay

## 2018-08-24 ENCOUNTER — Encounter: Payer: Self-pay | Admitting: Registered Nurse

## 2018-08-24 DIAGNOSIS — G0491 Myelitis, unspecified: Secondary | ICD-10-CM

## 2018-08-24 DIAGNOSIS — E43 Unspecified severe protein-calorie malnutrition: Secondary | ICD-10-CM | POA: Diagnosis not present

## 2018-08-24 DIAGNOSIS — N319 Neuromuscular dysfunction of bladder, unspecified: Secondary | ICD-10-CM

## 2018-08-24 DIAGNOSIS — K592 Neurogenic bowel, not elsewhere classified: Secondary | ICD-10-CM

## 2018-08-24 DIAGNOSIS — M329 Systemic lupus erythematosus, unspecified: Secondary | ICD-10-CM | POA: Diagnosis not present

## 2018-08-24 DIAGNOSIS — M797 Fibromyalgia: Secondary | ICD-10-CM | POA: Diagnosis not present

## 2018-08-24 DIAGNOSIS — E46 Unspecified protein-calorie malnutrition: Secondary | ICD-10-CM | POA: Diagnosis not present

## 2018-08-24 DIAGNOSIS — G894 Chronic pain syndrome: Secondary | ICD-10-CM

## 2018-08-24 DIAGNOSIS — E8809 Other disorders of plasma-protein metabolism, not elsewhere classified: Secondary | ICD-10-CM | POA: Diagnosis not present

## 2018-08-24 DIAGNOSIS — G43909 Migraine, unspecified, not intractable, without status migrainosus: Secondary | ICD-10-CM | POA: Diagnosis not present

## 2018-08-24 DIAGNOSIS — M50323 Other cervical disc degeneration at C6-C7 level: Secondary | ICD-10-CM | POA: Diagnosis not present

## 2018-08-24 DIAGNOSIS — R69 Illness, unspecified: Secondary | ICD-10-CM | POA: Diagnosis not present

## 2018-08-24 DIAGNOSIS — F419 Anxiety disorder, unspecified: Secondary | ICD-10-CM

## 2018-08-24 NOTE — Telephone Encounter (Signed)
Transitional Care call Transitional Questions answered by daughter in law [name]  Patient name: Susan Hodge DOB: 1950/12/07 1. Are you/is patient experiencing any problems since coming home? No a. Are there any questions regarding any aspect of care? No 2. Are there any questions regarding medications administration/dosing? No a. Are meds being taken as prescribed? Yes b. "Patient should review meds with caller to confirm"  Medication List reviewed  3. Have there been any falls? No 4. Has Home Health been to the house and/or have they contacted you? Yes, [place] a. If not, have you tried to contact them? NA b. Can we help you contact them? NA 5. Are bowels and bladder emptying properly? Yes a. Are there any unexpected incontinence issues? No b. If applicable, is patient following bowel/bladder programs? NA 6. Any fevers, problems with breathing, unexpected pain? No 7. Are there any skin problems or new areas of breakdown? No 8. Has the patient/family member arranged specialty MD follow up (ie cardiology/neurology/renal/surgical/etc.)?  Yes a. Can we help arrange? NA 9. Does the patient need any other services or support that we can help arrange? NA 10. Are caregivers following through as expected in assisting the patient? YES 11. Has the patient quit smoking, drinking alcohol, or using drugs as recommended? [name] states Ms. [name] resumed smoking when she came home. She was educated on smoking cessation. She still smoking inspite of education. She does not drink alcohol or use illicit drugs.  12.  Appointment date/time 09/09/2018  arrival time 11:15 for 11:30 appointment with Dr. Letta Pate. At [place]

## 2018-08-24 NOTE — Telephone Encounter (Signed)
Called pt per Dr. Felecia Shelling request. She is scheduled for 09/09/18 for NP appt. Dr. Felecia Shelling would like to work her in sooner. Pt states her daughter [name] already made arrangements to bring her on 09/09/18. She is a Marine scientist. She will call daughter to see if 09/01/18 at 1:30pm will work and call back to let me know. Appt placed on hold until she calls back.

## 2018-08-24 NOTE — Progress Notes (Signed)
Transitional Care call Transitional Questions answered by daughter in law [name]. This Provider spoke with her daughter-in- law at 11:06am   Patient name: Susan Hodge DOB: [birth year] 1. Are you/is patient experiencing any problems since coming home? No a. Are there any questions regarding any aspect of care? No 2. Are there any questions regarding medications administration/dosing? No a. Are meds being taken as prescribed? Yes b. "Patient should review meds with caller to confirm"  Medication List reviewed  3. Have there been any falls? No 4. Has Home Health been to the house and/or have they contacted you? Yes, [facility] a. If not, have you tried to contact them? NA b. Can we help you contact them? NA 5. Are bowels and bladder emptying properly? Yes a. Are there any unexpected incontinence issues? No b. If applicable, is patient following bowel/bladder programs? NA 6. Any fevers, problems with breathing, unexpected pain? No 7. Are there any skin problems or new areas of breakdown? No 8. Has the patient/family member arranged specialty MD follow up (ie cardiology/neurology/renal/surgical/etc.)? Yes a. Can we help arrange? NA 9. Does the patient need any other services or support that we can help arrange? NA 10. Are caregivers following through as expected in assisting the patient? YES 11. Has the patient quit smoking, drinking alcohol, or using drugs as recommended? [name] states [name] resumed smoking when she came home. She was educated on smoking cessation. She still smoking inspite of education. She does not drink alcohol or use illicit drugs.  12.  Appointment date/time 09/09/2018  arrival time 11:15 for 11:30 appointment with Dr. Letta Pate. At [place]

## 2018-08-26 DIAGNOSIS — R69 Illness, unspecified: Secondary | ICD-10-CM | POA: Diagnosis not present

## 2018-08-26 DIAGNOSIS — E43 Unspecified severe protein-calorie malnutrition: Secondary | ICD-10-CM | POA: Diagnosis not present

## 2018-08-26 DIAGNOSIS — G43909 Migraine, unspecified, not intractable, without status migrainosus: Secondary | ICD-10-CM | POA: Diagnosis not present

## 2018-08-26 DIAGNOSIS — E8809 Other disorders of plasma-protein metabolism, not elsewhere classified: Secondary | ICD-10-CM | POA: Diagnosis not present

## 2018-08-26 DIAGNOSIS — M797 Fibromyalgia: Secondary | ICD-10-CM | POA: Diagnosis not present

## 2018-08-26 DIAGNOSIS — G894 Chronic pain syndrome: Secondary | ICD-10-CM | POA: Diagnosis not present

## 2018-08-26 DIAGNOSIS — N319 Neuromuscular dysfunction of bladder, unspecified: Secondary | ICD-10-CM | POA: Diagnosis not present

## 2018-08-26 DIAGNOSIS — K592 Neurogenic bowel, not elsewhere classified: Secondary | ICD-10-CM | POA: Diagnosis not present

## 2018-08-26 DIAGNOSIS — M329 Systemic lupus erythematosus, unspecified: Secondary | ICD-10-CM | POA: Diagnosis not present

## 2018-08-26 DIAGNOSIS — M50323 Other cervical disc degeneration at C6-C7 level: Secondary | ICD-10-CM | POA: Diagnosis not present

## 2018-08-27 DIAGNOSIS — G43909 Migraine, unspecified, not intractable, without status migrainosus: Secondary | ICD-10-CM | POA: Diagnosis not present

## 2018-08-27 DIAGNOSIS — M797 Fibromyalgia: Secondary | ICD-10-CM | POA: Diagnosis not present

## 2018-08-27 DIAGNOSIS — E8809 Other disorders of plasma-protein metabolism, not elsewhere classified: Secondary | ICD-10-CM | POA: Diagnosis not present

## 2018-08-27 DIAGNOSIS — M50323 Other cervical disc degeneration at C6-C7 level: Secondary | ICD-10-CM | POA: Diagnosis not present

## 2018-08-27 DIAGNOSIS — N319 Neuromuscular dysfunction of bladder, unspecified: Secondary | ICD-10-CM | POA: Diagnosis not present

## 2018-08-27 DIAGNOSIS — M329 Systemic lupus erythematosus, unspecified: Secondary | ICD-10-CM | POA: Diagnosis not present

## 2018-08-27 DIAGNOSIS — E43 Unspecified severe protein-calorie malnutrition: Secondary | ICD-10-CM | POA: Diagnosis not present

## 2018-08-27 DIAGNOSIS — R69 Illness, unspecified: Secondary | ICD-10-CM | POA: Diagnosis not present

## 2018-08-27 DIAGNOSIS — G894 Chronic pain syndrome: Secondary | ICD-10-CM | POA: Diagnosis not present

## 2018-08-27 DIAGNOSIS — K592 Neurogenic bowel, not elsewhere classified: Secondary | ICD-10-CM | POA: Diagnosis not present

## 2018-08-30 DIAGNOSIS — E43 Unspecified severe protein-calorie malnutrition: Secondary | ICD-10-CM | POA: Diagnosis not present

## 2018-08-30 DIAGNOSIS — R69 Illness, unspecified: Secondary | ICD-10-CM | POA: Diagnosis not present

## 2018-08-30 DIAGNOSIS — M50323 Other cervical disc degeneration at C6-C7 level: Secondary | ICD-10-CM | POA: Diagnosis not present

## 2018-08-30 DIAGNOSIS — N319 Neuromuscular dysfunction of bladder, unspecified: Secondary | ICD-10-CM | POA: Diagnosis not present

## 2018-08-30 DIAGNOSIS — K592 Neurogenic bowel, not elsewhere classified: Secondary | ICD-10-CM | POA: Diagnosis not present

## 2018-08-30 DIAGNOSIS — G43909 Migraine, unspecified, not intractable, without status migrainosus: Secondary | ICD-10-CM | POA: Diagnosis not present

## 2018-08-30 DIAGNOSIS — M329 Systemic lupus erythematosus, unspecified: Secondary | ICD-10-CM | POA: Diagnosis not present

## 2018-08-30 DIAGNOSIS — G894 Chronic pain syndrome: Secondary | ICD-10-CM | POA: Diagnosis not present

## 2018-08-30 DIAGNOSIS — M797 Fibromyalgia: Secondary | ICD-10-CM | POA: Diagnosis not present

## 2018-08-30 DIAGNOSIS — E8809 Other disorders of plasma-protein metabolism, not elsewhere classified: Secondary | ICD-10-CM | POA: Diagnosis not present

## 2018-09-06 ENCOUNTER — Inpatient Hospital Stay: Payer: Medicare HMO | Admitting: Physical Medicine & Rehabilitation

## 2018-09-09 ENCOUNTER — Ambulatory Visit (INDEPENDENT_AMBULATORY_CARE_PROVIDER_SITE_OTHER): Payer: Medicare HMO | Admitting: Neurology

## 2018-09-09 ENCOUNTER — Encounter: Payer: Self-pay | Admitting: Neurology

## 2018-09-09 ENCOUNTER — Other Ambulatory Visit: Payer: Self-pay

## 2018-09-09 ENCOUNTER — Encounter: Payer: Self-pay | Admitting: Physical Medicine & Rehabilitation

## 2018-09-09 ENCOUNTER — Encounter: Payer: Medicare HMO | Attending: Physical Medicine & Rehabilitation | Admitting: Physical Medicine & Rehabilitation

## 2018-09-09 VITALS — BP 102/69 | HR 72 | Temp 98.1°F | Ht 60.0 in | Wt 124.0 lb

## 2018-09-09 VITALS — BP 106/69 | HR 75 | Ht 60.0 in

## 2018-09-09 DIAGNOSIS — G360 Neuromyelitis optica [Devic]: Secondary | ICD-10-CM | POA: Diagnosis not present

## 2018-09-09 DIAGNOSIS — G8389 Other specified paralytic syndromes: Secondary | ICD-10-CM | POA: Diagnosis not present

## 2018-09-09 DIAGNOSIS — G0491 Myelitis, unspecified: Secondary | ICD-10-CM | POA: Diagnosis not present

## 2018-09-09 DIAGNOSIS — G8194 Hemiplegia, unspecified affecting left nondominant side: Secondary | ICD-10-CM | POA: Insufficient documentation

## 2018-09-09 DIAGNOSIS — R208 Other disturbances of skin sensation: Secondary | ICD-10-CM | POA: Insufficient documentation

## 2018-09-09 MED ORDER — PREDNISONE 50 MG PO TABS: ORAL_TABLET | ORAL | 0 refills | Status: DC

## 2018-09-09 NOTE — Progress Notes (Signed)
Patients family came in with patient today, they were advised of current clinical policies about visitors not being able to come in with patient and agreed to wait for patient outside of clinic.  Prior to there departure they asked if they can relay a bit of information to me to insure that Dr. Letta Pate recieved it.    They stated that they have noticed that Susan Hodge has had increasing weakness on her entire left side of her body, Decreasing activity and movement in general, decreased ability in speech, and an increase in spasms in her left leg.  Lastly the patients daughter has asked if Dr. Letta Pate could include her with the patients visit via telephone which the phone number will be provided by the patient.

## 2018-09-09 NOTE — Progress Notes (Signed)
[facility]  PATIENT: Susan Hodge DOB: [id number]  REFERRING DOCTOR OR PCP:  [name], NP SOURCE: Patient, daughter, notes from hospital and rehab admissions, multiple imaging and lab reports, MRI images of brain, cervical, thoracic and lumbar spine were reviewed  _________________________________   HISTORICAL  CHIEF COMPLAINT:  Chief Complaint  Patient presents with   New Patient (Initial Visit)    RM 44 with daughter. Internal referral for myelitis. At [facility] from 08/03/18-08/18/18. myelitis at C3-4 and C7 placed on IV Solu-Medrol through 08/04/2018.    Gait Problem    In wheelchair in office today. Unable to stand. Weak on left side. Hx DVT in left leg. Left arm started becoming very weak yesterday which is new for her. Yesterday afternoon was the last time she used a walker to go to the bathroom. In physical therapy right now. Friend is living with her right now to help her.    HISTORY OF PRESENT ILLNESS:  I had the pleasure of seeing your patient, Susan Hodge, at [facility] for neurologic consultation regarding her transverse myelitis.  She began to experience clumsiness in mid March.  She had been paced on topiramate for migraines and had recently and had some dizziness and vertigo initially.  She fell and a day later began to have urinary incontinence and inability to move the left leg.   She went to the ED at [facility] and an MRI of the lumbar spine shoowed degenerative disc changes.    She was prescribed a steroid pack and discharged.   The next day she was worse and her PCP sent her to the [facility] ED.   She had MRI of the brain and cervical and thoracic spine.   The MRI of hte brain showed a remote right MCA stroke (she never had symptoms).   MRI of the spine showed multifocal lesions within the spinal cord, centrally but asymmetric.      She received 5 days of IV Solu-Medrol in the hospital.   She initially improved  after the steroids and was using a walker well a couple weeks later at discharge from rehab.  Balance was still poor but strength was good.   Of note she had no arm weakness in March or April.   Over the past couple days, she has felt much weaker with more left leg > arm weakness.  .  She also has had right flank numbness and a dysesthetic sensation (pressure like pain) since yesterday.      Bladder impoved with steroids and is continuing to do well (she is on Enablex).    She notes no change in vision.  She notes some fatigue especially with endurance.  She denies any depression or anxiety.  She had shingles Sep 24, 2017 in the right V1 distribution with ocular and forehead rash.   She has had headaches since that time.   Even before shingles, she was on Lyrica for leg pain.   Even before shingles, she had migraines and was on Depakene.  She is currently on Percocet 7.5 mg 3-4 a day.    Her only loss of vision occurred with the shingles last year and resolved as the rash resolved.   She was diagnosed with SLE in the 1980's but was never on an immunomodulatory agent.   She had a butterfly rash and abnormal tests.   We don't have any more details.  She also has a long history of migraine headaches that  predated her more recent issues.  She is on Eliquis due to a DVT noted during her march admission.     I personally reviewed the MRIs of the brain and spine from March 2020.  The MRI of the brain 07/31/2018 shows a remote right MCA infarction with a small amount of associated hemosiderin.  There are also some scattered T2/flair hyperintense foci consistent with mild chronic microvascular ischemic changes.  There were no acute findings.  The MRI of the cervical spine shows T2 hyperintense signal within the spinal cord posteriorly to the right adjacent to C2, posterior to the left adjacent to C3, and a little more to the right but central adjacent to C7.  The foci adjacent to C3 and C7 does have subtle  enhancement after gadolinium was infused.  There are some degenerative changes at C4-C5, C5-C6 and C6-C7 but no definite nerve root compression and no spinal stenosis.   MRI of the thoracic spine shows diffuse patchy T2 hyperintensity that is predominantly central.   Number spine shows the abnormal signal within the spinal cord adjacent to T10 and T11.  There are degenerative changes at L3-L4, L4-L5 and L5-S1.  There is spinal stenosis at L4-L5.  With possibility for left L4 and L5 nerve root  She had lab work during her hospitalization.  The neuromyelitis optica IgG antibody was very positive at 139 (< 3 normal).  Lumbar puncture showed a small elevation of white blood cell count, predominantly lymphocytes and macrophages.  B12, copper were normal.  Herpes simplex virus and HIV were negative.  ACE was normal.  REVIEW OF SYSTEMS: Constitutional: No fevers, chills, sweats, or change in appetite Eyes: No visual changes, double vision, eye pain Ear, nose and throat: No hearing loss, ear pain, nasal congestion, sore throat Cardiovascular: No chest pain, palpitations Respiratory: No shortness of breath at rest or with exertion.   No wheezes GastrointestinaI: No nausea, vomiting, diarrhea, abdominal pain, fecal incontinence Genitourinary:As above Musculoskeletal: No neck pain, back pain Integumentary: No rash, pruritus, skin lesions Neurological: as above Psychiatric: No depression at this time.  No anxiety Endocrine: No palpitations, diaphoresis, change in appetite, change in weigh or increased thirst Hematologic/Lymphatic: No anemia, purpura, petechiae. Allergic/Immunologic: No itchy/runny eyes, nasal congestion, recent allergic reactions, rashes  ALLERGIES: Allergies  Allergen Reactions   Amitriptyline Other (See Comments)   Bee Venom Other (See Comments)   Fexofenadine Other (See Comments)   Furosemide Other (See Comments)   Gabapentin Other (See Comments)   Hydroxychloroquine  Sulfate Other (See Comments)   Morphine Other (See Comments)   Nsaids    Oxycodone Other (See Comments)    dizzy   Topiramate Other (See Comments)    HOME MEDICATIONS:  Current Outpatient Medications:    acetaminophen (TYLENOL) 325 MG tablet, Take 2 tablets (650 mg total) by mouth every 6 (six) hours as needed for mild pain (or Fever >/= 101)., Disp: , Rfl:    ALPRAZolam (XANAX) 0.5 MG tablet, Take 1 tablet (0.5 mg total) by mouth 3 (three) times daily as needed for anxiety or sleep., Disp: 20 tablet, Rfl: 0   apixaban (ELIQUIS) 5 MG TABS tablet, Take 1 tablet (5 mg total) by mouth 2 (two) times daily., Disp: 60 tablet, Rfl: 1   Cyanocobalamin (VITAMIN B-12 PO), Take 2 tablets by mouth daily., Disp: , Rfl:    darifenacin (ENABLEX) 7.5 MG 24 hr tablet, Take 1 tablet (7.5 mg total) by mouth daily., Disp: 30 tablet, Rfl: 1   loratadine (CLARITIN) 10  MG tablet, Take 10 mg by mouth daily., Disp: , Rfl:    oxyCODONE-acetaminophen (PERCOCET) 7.5-325 MG tablet, Take 1 tablet by mouth every 8 (eight) hours as needed., Disp: 30 tablet, Rfl: 0   pantoprazole (PROTONIX) 40 MG tablet, Take 1 tablet (40 mg total) by mouth 2 (two) times daily., Disp: 60 tablet, Rfl: 0   pregabalin (LYRICA) 50 MG capsule, Take 1 capsule (50 mg total) by mouth 3 (three) times daily., Disp: 90 capsule, Rfl: 0   valproic acid (DEPAKENE) 250 MG capsule, Take 1 capsule (250 mg total) by mouth at bedtime., Disp: 30 capsule, Rfl: 1   Vitamin D, Ergocalciferol, (DRISDOL) 1.25 MG (50000 UT) CAPS capsule, Take 1 capsule (50,000 Units total) by mouth every Monday., Disp: 4 capsule, Rfl: 0   vitamin E 1000 UNIT capsule, Take 1,000 Units by mouth daily., Disp: , Rfl:   PAST MEDICAL HISTORY: Past Medical History:  Diagnosis Date   Chest pain    Depression    DJD (degenerative joint disease)    FH: CVA (cerebrovascular accident)    father   Fibromyalgia    GERD (gastroesophageal reflux disease)     Hypothyroidism    Lupus (HCC)    Migraines    Palpitations    Status post cholecystectomy    appendectomy and bilaateral oophorectomy   Tobacco use     PAST SURGICAL HISTORY: Past Surgical History:  Procedure Laterality Date   BILATERAL OOPHORECTOMY     CARDIAC CATHETERIZATION     CHOLECYSTECTOMY      FAMILY HISTORY: Family History  Problem Relation Age of Onset   Stroke Father     SOCIAL HISTORY:  Social History   Socioeconomic History   Marital status: Single    Spouse name: Not on file   Number of children: 3   Years of education: Not on file   Highest education level: Not on file  Occupational History   Occupation: disability  Social Designer, fashion/clothing strain: Not on file   Food insecurity:    Worry: Not on file    Inability: Not on file   Transportation needs:    Medical: Not on file    Non-medical: Not on file  Tobacco Use   Smoking status: Current Every Day Smoker   Smokeless tobacco: Never Used  Substance and Sexual Activity   Alcohol use: No   Drug use: Never   Sexual activity: Not on file  Lifestyle   Physical activity:    Days per week: Not on file    Minutes per session: Not on file   Stress: Not on file  Relationships   Social connections:    Talks on phone: Not on file    Gets together: Not on file    Attends religious service: Not on file    Active member of club or organization: Not on file    Attends meetings of clubs or organizations: Not on file    Relationship status: Not on file   Intimate partner violence:    Fear of current or ex partner: Not on file    Emotionally abused: Not on file    Physically abused: Not on file    Forced sexual activity: Not on file  Other Topics Concern   Not on file  Social History Narrative   Friend lives with her   Caffeine use: coffee daily   Left handed      PHYSICAL EXAM  Vitals:   09/09/18 1318  BP:  106/69  Pulse: 75  Height: 5' (1.524 m)     Body mass index is 24.22 kg/m.   General: The patient is well-developed and well-nourished and in no acute distress  Eyes:  Funduscopic exam shows normal optic discs and retinal vessels.  Sclera are anicteric.  Neck: The neck is supple, no carotid bruits are noted.  The neck is nontender.  Cardiovascular: The heart has a regular rate and rhythm with a normal S1 and S2. There were no murmurs, gallops or rubs. Lungs are clear to auscultation.  Skin: Extremities are without significant edema.  Musculoskeletal:  Back is nontender  Neurologic Exam  Mental status: The patient is alert and oriented x 3 at the time of the examination. The patient has apparent normal recent and remote memory, with an apparently normal attention span and concentration ability.   Speech is normal.  Cranial nerves: Extraocular movements are full. Pupils are equal, round, and reactive to light and accomodation.  Visual fields are full.  Facial symmetry is present. There is good facial sensation to soft touch bilaterally.Facial strength is normal.  Trapezius and sternocleidomastoid strength is normal. No dysarthria is noted.  The tongue is midline, and the patient has symmetric elevation of the soft palate. No obvious hearing deficits are noted.  Motor:  Muscle bulk is normal.   Mildly increased muscle tone in the left leg.  Strength was 2/5 in the left leg except 2+/5 in the left quads.  Strength was 3/5 in the right ankle and 4/5 elsewhere in the leg.  Strength was normal in the right arm and 2+/5 in the left deltoid and 4-/5 elsewhere in the arm  Sensory: She reports decrease sensation to touch and temperature in the left arm and leg and decreased vibration sensation in the right leg.  There is also decreased sensation to temperature and touch in the right flank.  Coordination: Cerebellar testing reveals good right finger-nose-finger but she is unable to do left finger-nose-finger.  Reduced right heel-to-shin  and unable to do left  Gait and station: She cannot stand or walk.  Reflexes: Deep tendon reflexes are mildly increased in the left leg relative to the right.  No clonus.    DIAGNOSTIC DATA (LABS, IMAGING, TESTING) - I reviewed patient records, labs, notes, testing and imaging myself where available.  Lab Results  Component Value Date   WBC 9.6 08/09/2018   HGB 11.8 (L) 08/09/2018   HCT 35.1 (L) 08/09/2018   MCV 92.6 08/09/2018   PLT 154 08/09/2018      Component Value Date/Time   NA 134 (L) 08/09/2018 1015   K 3.9 08/09/2018 1015   CL 99 08/09/2018 1015   CO2 27 08/09/2018 1015   GLUCOSE 100 (H) 08/09/2018 1015   BUN 16 08/09/2018 1015   CREATININE 0.77 08/09/2018 1015   CALCIUM 8.4 (L) 08/09/2018 1015   PROT 5.2 (L) 08/04/2018 0613   ALBUMIN 2.6 (L) 08/04/2018 0613   AST 18 08/04/2018 0613   ALT 19 08/04/2018 0613   ALKPHOS 42 08/04/2018 0613   BILITOT 0.4 08/04/2018 0613   GFRNONAA >60 08/09/2018 1015   GFRAA >60 08/09/2018 1015    No results found for: HGBA1C Lab Results  Component Value Date   VITAMINB12 1,457 (H) 08/01/2018        ASSESSMENT AND PLAN  Neuromyelitis optica (devic) (HCC)  Myelitis ([place]) - Plan: ANA w/Reflex, Pan-ANCA  Left hemiplegia ([place])  Dysesthesia   In summary, Harini Dearmond is a 68 year old woman  who had weakness, bladder incontinence and sensory changes due to extensive myelitis in March 2020.  She improved with IV steroids and therapy.  However, over the past 2 days she has progressively worsened and now has severe left leg and moderate left arm weakness.  Additionally she has right flank numbness.  The changes noted on MRI are most consistent with myelopathy due to neuromyelitis optica (Devic's disease).  This is confirmed with a markedly elevated anti-NMO antibody.  She appears to be having an exacerbation that started 2 days ago, likely due to left upper cervical myelitis.  While in the office she received 1 g IV Solu-Medrol  and I have sent in a prescription for a very high dose prednisone taper (500 mg x 3 days followed by taper over the next 5 days).  We also need to begin a disease modifying therapy for her neuromyelitis optica.  I discussed Soliris and Rituxan.  With the COVID-19 pandemic, Soliris would be a safer option.  She will need to be immunized with the meningococcus vaccine before starting Soliris.  If her insurance will not cover Solaris we would need to consider Rituxan that might have more risk of infection.  She and her daughter are advised to call us back if there are any new or worsening neurologic symptoms.  I also would like to touch base with her with a virtual visit in 2 to 3 weeks to determine her stability and make sure that we are on track to begin a disease modifying therapy.  If her strength deteriorates over the next few days, she may also need to be readmitted to inpatient rehab.  Thank you for asking me to see Ms. Georgann Housekeeper.  Please let me know if I can be of further assistance with her or other patients in the future.  Catharine Kettlewell A. Felecia Shelling, MD, [facility] 05/06/1973, 8:83 PM Certified in Neurology, Clinical Neurophysiology, Sleep Medicine, Pain Medicine and Neuroimaging  [facility] [address] [place], [place] [address] [phone]

## 2018-09-09 NOTE — Patient Instructions (Addendum)
Please get a shoe 1/2 size larger to accomodate brace

## 2018-09-09 NOTE — Progress Notes (Signed)
Subjective:    Patient ID: Susan Hodge, female    DOB: 01-Jul-1950, 68 y.o.   MRN: [id number] Noted hospital course acute DVT left gastrocnemius vein she was transitioned from Lovenox to Eliquis without bleeding episodes. Chronic pain syndrome history of fibromyalgia migraine headaches she remained on valproic acid 250 mg twice daily as well as Lyrica scheduled and oxycodone as needed. Mood stabilization with Xanax as needed emotional support provided. She did have a history of tobacco abuse receiving counseling in regard to cessation of nicotine products. Protonix ongoing for history of GERD. She denied any nausea or vomiting. Neurogenic bowel and bladder with timed toileting schedule PVRs relatively unremarkable trial of Enablex initiated for 09/22/2018. Blood pressures overall remained controlled  68 year old right-handed female with history of migraine headaches maintained on valproic acid, fibromyalgia, lupus and tobacco abuse. She lives alone one level home independent prior to admission. Presented 07/31/2018 with recent fall 2 weeks ago landing on her buttocks noting progressive lower extremity weakness. Denied any back or bladder disturbances. MRI of the brain negative for acute changes however there was a chronic right MCA infarction. CT lumbar thoracic and cervical spine showed multifocal T2 signal abnormality and cervical spinal cord including enhancing lesion at C3-4 and 5 as well as C7. Cervical disc degeneration C6-7. Lumbar puncture completed WBC 14 lymphs 83, glucose 64. Venous Dopplers lower extremity showed acute DVT left gastrocnemius vein. Placed on therapeutic dose of Lovenox transition to Eliquis 08/03/2018. Neurology consulted and workup for myelitis at C3-4 and C7 placed on IV Solu-Medrol through 08/04/2018. Therapy evaluations completed patient was admitted for a comprehensive rehabilitation program.   HPI   The patient states that she feels weaker this morning, she has had  problems with transfers.  Her daughter-in-law who is available by phone indicated that the left arm was also weaker.  The patient felt weak going home from the hospital but over the last 3 weeks felt like she was getting stronger.  She has finished her home health PT and OT.  Her therapist recommended continuing with outpatient therapy. The patient has no new numbness or tingling no new pains other than some back pain in the lower lumbar area Uses Walker to get in and out of bathroom Patient states that she has been taking the normal amount of her oxycodone Xanax and Lyrica.  And this was confirmed by her daughter-in-law. The patient states there has been no change in her bowel or bladder habits.  She indicates that her bladder is emptying well and that her frequency of urination has reduced.  We discussed that she may stop her Enablex and monitor for increased frequency of urination Patient denies any bladder incontinence No falls since d/c  Mod I with dressing Needs assist for tub bench transfers  Has Neuro f/u with Dr Felecia Shelling today ? Nerve conduction study  Pain Inventory Average Pain 8 Pain Right Now 8 My pain is intermittent, sharp and burning  In the last 24 hours, has pain interfered with the following? General activity 0 Relation with others 0 Enjoyment of life 0 What TIME of day is your pain at its worst? morning Sleep (in general) Poor  Pain is worse with: walking Pain improves with: rest and medication Relief from Meds: 10  Mobility walk with assistance use a walker use a wheelchair  Function retired  Neuro/Psych weakness numbness tremor trouble walking spasms  Prior Studies hospital follow up  Physicians involved in your care hospital follow up   [facility]  History  Problem Relation Age of Onset  . Stroke Father    Social History   Socioeconomic History  . Marital status: Single    Spouse name: Not on file  . Number of children: 3  . Years of  education: Not on file  . Highest education level: Not on file  Occupational History  . Occupation: disability  Social Needs  . Financial resource strain: Not on file  . Food insecurity:    Worry: Not on file    Inability: Not on file  . Transportation needs:    Medical: Not on file    Non-medical: Not on file  Tobacco Use  . Smoking status: Current Every Day Smoker  . Smokeless tobacco: Never Used  Substance and Sexual Activity  . Alcohol use: No  . Drug use: Never  . Sexual activity: Not on file  Lifestyle  . Physical activity:    Days per week: Not on file    Minutes per session: Not on file  . Stress: Not on file  Relationships  . Social connections:    Talks on phone: Not on file    Gets together: Not on file    Attends religious service: Not on file    Active member of club or organization: Not on file    Attends meetings of clubs or organizations: Not on file    Relationship status: Not on file  Other Topics Concern  . Not on file  Social History Narrative  . Not on file   Past Surgical History:  Procedure Laterality Date  . BILATERAL OOPHORECTOMY    . CARDIAC CATHETERIZATION    . CHOLECYSTECTOMY     Past Medical History:  Diagnosis Date  . Chest pain   . Depression   . DJD (degenerative joint disease)   . FH: CVA (cerebrovascular accident)    father  . Fibromyalgia   . GERD (gastroesophageal reflux disease)   . Hypothyroidism   . Lupus ([place])   . Migraines   . Palpitations   . Status post cholecystectomy    appendectomy and bilaateral oophorectomy  . Tobacco use    BP 102/69   Pulse 72   Temp 98.1 F (36.7 C)   Ht 5' (1.524 m)   Wt 124 lb (56.2 kg)   SpO2 98%   BMI 24.22 kg/m   Opioid Risk Score:   Fall Risk Score:  `1  Depression screen PHQ 2/9  No flowsheet data found.   Review of Systems  Constitutional: Positive for fatigue.  HENT: Negative.   Eyes: Negative.   Respiratory: Negative.   Cardiovascular: Positive for leg  swelling.  Gastrointestinal: Positive for nausea.  Endocrine: Negative.   Genitourinary: Negative.   Musculoskeletal: Positive for arthralgias, gait problem, joint swelling and myalgias.  Skin: Negative.   Allergic/Immunologic: Negative.   Neurological: Positive for tremors, weakness and numbness.  Hematological: Negative.   Psychiatric/Behavioral: Negative.   All other systems reviewed and are negative.      Objective:   Physical Exam Vitals signs and nursing note reviewed.  Constitutional:      Appearance: Normal appearance.  HENT:     Head: Normocephalic and atraumatic.     Nose: Nose normal.     Mouth/Throat:     Mouth: Mucous membranes are moist.  Eyes:     Extraocular Movements: Extraocular movements intact.     Conjunctiva/sclera: Conjunctivae normal.     Pupils: Pupils are equal, round, and reactive to light.  Neck:  Musculoskeletal: No neck rigidity or muscular tenderness.  Cardiovascular:     Rate and Rhythm: Normal rate and regular rhythm.     Heart sounds: Normal heart sounds. No murmur.  Pulmonary:     Effort: Pulmonary effort is normal.     Breath sounds: Normal breath sounds. No stridor. No wheezing.  Abdominal:     General: Abdomen is flat. Bowel sounds are normal. There is no distension.     Palpations: Abdomen is soft.     Tenderness: There is no abdominal tenderness.  Musculoskeletal:     Comments: Mild tenderness at the PSIS bilaterally thoracic and lumbar spine without evidence of deformity.  No tenderness in the thoracic paraspinals or evidence of scoliosis or kyphosis.  Skin:    General: Skin is warm and dry.  Neurological:     Mental Status: She is alert and oriented to person, place, and time.     Sensory: Sensory deficit present.     Comments: Sensation intact to light touch in bilateral L5 and S1 dermatome, reduced in the left L4 dermatome  Psychiatric:        Mood and Affect: Mood normal.        Behavior: Behavior normal.     LUE  3-/5 Deltoid, 4/5 biceps , triceps , grip RUE 5/5 Deltoid, biceps, triceps, grip  RLE 4/5 HF, KE, 3- R ankle Dorsiflexor LLE 2- Left hip/knee extensor synergy 3- Left knee extensor 2- Left ankle Dorsiflexors       Assessment & Plan:  1.  Cervical myelitis likely inflammatory although etiology not determined.  Comparing her manual muscle testing today versus progress note on 08/18/2018, no significant differences in the lower ext but increased weakness in  left upper ext  Pt has appt with neuro Dr Clotilde Dieter today and will discuss  Will make referral to OP PT, OT in [place]  Pt will f/u Medically with Dr Cyndi Bender who will take over on Rx  RTC 2 mo  Goals of walking with cane

## 2018-09-13 LAB — PAN-ANCA
ANCA Proteinase 3: <3.5 U/mL (ref 0.0–3.5)
Atypical pANCA: <1:20 {titer}
C-ANCA: <1:20 {titer}
Myeloperoxidase Ab: <9 U/mL (ref 0.0–9.0)
P-ANCA: <1:20 {titer}

## 2018-09-13 LAB — ANA W/REFLEX: Anti Nuclear Antibody (ANA): NEGATIVE

## 2018-09-14 ENCOUNTER — Telehealth: Payer: Self-pay | Admitting: *Deleted

## 2018-09-14 NOTE — Telephone Encounter (Signed)
Called and spoke with daughter (on [place]). Relayed results per Dr. Felecia Shelling. They have not called PCP office yet about meningitis vaccine. They will do this and then have them send what vaccine she received to [place] at [phone].   I also scheduled a 2 week f/u for 09/23/18 at 1:30pm and notified Katie at check out to let her know she was scheduled. I emailed daughter link for doxy. I emailed daughter link for [email or web address] at [email or web address]

## 2018-09-14 NOTE — Telephone Encounter (Signed)
-----   Message from Britt Bottom, MD sent at 09/13/2018  5:09 PM EDT ----- Please let her know the labs were fine.  We had discussed Soliris at the visit.  Has she discussed the meningitis vaccine with her PCP office? We need her to have the PCP office fax [place] with the type of Meningitis vaccine that they used

## 2018-09-17 DIAGNOSIS — R2689 Other abnormalities of gait and mobility: Secondary | ICD-10-CM | POA: Diagnosis not present

## 2018-09-20 ENCOUNTER — Telehealth: Payer: Self-pay | Admitting: *Deleted

## 2018-09-20 NOTE — Telephone Encounter (Signed)
[facility] and most other [facility] can do vaccinations.  She can call to check with her pharmacy before she goes

## 2018-09-20 NOTE — Telephone Encounter (Signed)
Daughter called office to inform Dr. Felecia Shelling pt unable to get meningitis vaccine at this time. PCP office not offering meningitis vaccine and health department not offering for medicare population. She wants to know what next steps should be. Advised I will send message to Dr. Felecia Shelling and call back.

## 2018-09-21 NOTE — Addendum Note (Signed)
Addended by: Arlice Colt A on: 09/21/2018 12:46 PM   Modules accepted: Orders

## 2018-09-21 NOTE — Telephone Encounter (Signed)
Faxed completed/signed enrollment form for soliris to [phone]. Phone: [phone]. Received fax confirmation.

## 2018-09-21 NOTE — Telephone Encounter (Signed)
Pt called and is asking for a return call and also for the prescription for the Meningitis Vaccine to [facility] at [phone]

## 2018-09-21 NOTE — Telephone Encounter (Signed)
Pt daughter [name] is asking for a call back confirming the vaccine has been [facility]

## 2018-09-21 NOTE — Telephone Encounter (Signed)
Thank you :)

## 2018-09-21 NOTE — Telephone Encounter (Signed)
Dr. Tacy Learn called daughter back. She states she has already checked with drug stores and they either don't do the meningitis vaccine or they are not doing it currently d/t covid-19.

## 2018-09-21 NOTE — Telephone Encounter (Signed)
I called daughter. Per Dr. Felecia Shelling: [facility] [address] in [place], [place] [facility] both vaccines in stock for meningitis. She will call them at Phone: [phone] to see about scheduling an appt. She will then have them fax vaccination info to [place] still.   Advised we are going to send in enrollment form so case manager can start working on her case. Advised her to call back if she has further questions/concerns.

## 2018-09-21 NOTE — Telephone Encounter (Signed)
Dr. Felecia Shelling- can you put in an order for meningitis vaccine? [facility] [place]. I was not sure which one you wanted

## 2018-09-21 NOTE — Telephone Encounter (Signed)
Called daughter back. [facility] at [phone]. Pt on her way now for vaccine (about 45 min away). Received fax confirmation.

## 2018-09-22 NOTE — Telephone Encounter (Signed)
Gave signed orders for Soliris to [facility] from Dr. Felecia Shelling

## 2018-09-23 ENCOUNTER — Other Ambulatory Visit: Payer: Self-pay

## 2018-09-23 ENCOUNTER — Encounter: Payer: Self-pay | Admitting: Neurology

## 2018-09-23 ENCOUNTER — Ambulatory Visit (INDEPENDENT_AMBULATORY_CARE_PROVIDER_SITE_OTHER): Payer: Medicare HMO | Admitting: Neurology

## 2018-09-23 DIAGNOSIS — G8194 Hemiplegia, unspecified affecting left nondominant side: Secondary | ICD-10-CM | POA: Diagnosis not present

## 2018-09-23 DIAGNOSIS — N319 Neuromuscular dysfunction of bladder, unspecified: Secondary | ICD-10-CM | POA: Diagnosis not present

## 2018-09-23 DIAGNOSIS — G360 Neuromyelitis optica [Devic]: Secondary | ICD-10-CM

## 2018-09-23 DIAGNOSIS — R208 Other disturbances of skin sensation: Secondary | ICD-10-CM

## 2018-09-23 MED ORDER — BACLOFEN 10 MG PO TABS: 10.0000 mg | ORAL_TABLET | Freq: Three times a day (TID) | ORAL | 5 refills | Status: DC

## 2018-09-23 MED ORDER — PREDNISONE 20 MG PO TABS: 20.0000 mg | ORAL_TABLET | Freq: Every day | ORAL | 3 refills | Status: DC

## 2018-09-23 MED ORDER — DARIFENACIN HYDROBROMIDE ER 15 MG PO TB24: 15.0000 mg | ORAL_TABLET | Freq: Every day | ORAL | 5 refills | Status: DC

## 2018-09-23 NOTE — Progress Notes (Signed)
[facility]  PATIENT: Susan Hodge DOB: 30-Mar-1951  REFERRING DOCTOR OR PCP:  [name], NP SOURCE: Patient, daughter, notes from hospital and rehab admissions, multiple imaging and lab reports, MRI images of brain, cervical, thoracic and lumbar spine were reviewed  _________________________________   HISTORICAL  CHIEF COMPLAINT:  Chief Complaint  Patient presents with   Other    Neuromyelitis optica    Update 09/23/2018 Virtual Visit via Video Note I connected with Susan Hodge on 09/23/18 at  1:30 PM EDT by a video enabled telemedicine application and verified that I am speaking with the correct person.  I discussed the limitations of evaluation and management by telemedicine and the availability of in person appointments. The patient expressed understanding and agreed to proceed.  History of Present Illness: At the last visit, given the combination of her presentation, spinal cord MRI and anti-NMO antibody, she was diagnosed with neuromyelitis optica.  She appeared to have had a brand-new exacerbation that started a day or 2 before the visit and I gave her 1 day of high-dose IV Solu-Medrol followed by a very high-dose prednisone oral taper.  She feels she improved a lot after the steroids.  Specifically, she is able to walk with her walker again (she had been doing that when discharged from the hospital but lost that ability a day or 2 before the visit).  Additionally, the left hand weakness and clumsiness which began shortly before the visit has improved though her left arm is still worse than the right arm.  Of note, she is left-handed.  She is able to write again though her handwriting is messy.  She had not been able to write a couple weeks ago.    At that time, we discussed treatment options.  Soliris is FDA approved for neuromyelitis optica and Rituxan is used quite a bit off label.  Because of the COVID-19 pandemic, Soliris would be safer.  She did  get the initial meningitis vaccination (both the [place] and the B shots) and she will need boosters next month for the 1 and in 2 months for the other.  Hopefully she will quickly get authorized so we can get her Soliris infusions started.  I discussed with her that since she appears to respond well to the steroids that I will place her on a 20 mg oral dose in the interim though the IV will be much stronger.  Vasculitis lab work performed at the last visit was negative.  Currently, gait is still poor though she is able to use the walker.   She has clumsiness in the left hand though it is much better than 2 weeks ago.  Strength is better in the left hand and arm.  Vision is fine.  She has urinary urgency and rare urge incontinence (just once since last visit and couldn't get to bathroom in time.  She is on Enablex 7.5 mg and we discussed a higher dose.  She has pain in her feet and her back.   She notes her legs will spasm when she stands up.   She also has a dysesthetic sensation in both feet.  Lyrica has helped that some.   Observations/Objective: She is a well-developed well-nourished woman in no acute distress.  The head is normocephalic and atraumatic.  Sclera are anicteric.  Visible skin appears normal.  The neck has a good range of motion.  Pharynx and tongue have normal appearance.  She is alert and fully oriented with  fluent speech and good attention, knowledge and memory.  Extraocular muscles are intact.  Facial strength is normal.  Palatal elevation and tongue protrusion are midline.  She appears to have normal strength in the right arm and slightly reduced strength in the left arm with reduced left Rapid alternating movements and reduced left finger-nose-finger.  Assessment and Plan: Neuromyelitis optica (devic) (HCC)  Left hemiplegia (HCC)  Neurogenic bladder  Dysesthesia  1.   She has neuromyelitis optica and we are trying to get her started on Soliris therapy.  I am concerned that she  had her second relapse a few weeks after she completed steroids for her first relapse.  Therefore, I am going to have her start prednisone 20 mg daily to help hold her until we can get authorization for her infusions. 2.   Increase Enablex to 15 mg.  Begin baclofen 10 mg and titrate up to 10 mg 3 times daily for spasticity of central origin. 3.   Stay active and exercise as tolerated.  She will continue to see rehab. 4.   I will see her back for regular visit in 2 months and she will be beginning infusions soon she should call if she has new or worsening neurologic symptoms.  Follow Up Instructions: I discussed the assessment and treatment plan with the patient. The patient was provided an opportunity to ask questions and all were answered. The patient agreed with the plan and demonstrated an understanding of the instructions.    The patient was advised to call back or seek an in-person evaluation if the symptoms worsen or if the condition fails to improve as anticipated.  I provided 25 minutes of non-face-to-face time during this encounter.  __________________________________________________ FROM INITIAL VISIT 09/09/2018: HISTORY OF PRESENT ILLNESS:  I had the pleasure of seeing your patient, Susan Hodge, at [facility] for neurologic consultation regarding her transverse myelitis.  She began to experience clumsiness in mid March.  She had been paced on topiramate for migraines and had recently and had some dizziness and vertigo initially.  She fell and a day later began to have urinary incontinence and inability to move the left leg.   She went to the ED at [facility] and an MRI of the lumbar spine shoowed degenerative disc changes.    She was prescribed a steroid pack and discharged.   The next day she was worse and her PCP sent her to the [facility] ED.   She had MRI of the brain and cervical and thoracic spine.   The MRI of hte brain showed a remote right MCA stroke (she never  had symptoms).   MRI of the spine showed multifocal lesions within the spinal cord, centrally but asymmetric.      She received 5 days of IV Solu-Medrol in the hospital.   She initially improved after the steroids and was using a walker well a couple weeks later at discharge from rehab.  Balance was still poor but strength was good.   Of note she had no arm weakness in March or April.   Over the past couple days, she has felt much weaker with more left leg > arm weakness.  .  She also has had right flank numbness and a dysesthetic sensation (pressure like pain) since yesterday.      Bladder impoved with steroids and is continuing to do well (she is on Enablex).    She notes no change in vision.  She notes some fatigue especially with  endurance.  She denies any depression or anxiety.  She had shingles Sep 24, 2017 in the right V1 distribution with ocular and forehead rash.   She has had headaches since that time.   Even before shingles, she was on Lyrica for leg pain.   Even before shingles, she had migraines and was on Depakene.  She is currently on Percocet 7.5 mg 3-4 a day.    Her only loss of vision occurred with the shingles last year and resolved as the rash resolved.   She was diagnosed with SLE in the 1980's but was never on an immunomodulatory agent.   She had a butterfly rash and abnormal tests.   We don't have any more details.  She also has a long history of migraine headaches that predated her more recent issues.  She is on Eliquis due to a DVT noted during her march admission.     I personally reviewed the MRIs of the brain and spine from March 2020.  The MRI of the brain 07/31/2018 shows a remote right MCA infarction with a small amount of associated hemosiderin.  There are also some scattered T2/flair hyperintense foci consistent with mild chronic microvascular ischemic changes.  There were no acute findings.  The MRI of the cervical spine shows T2 hyperintense signal within the spinal cord  posteriorly to the right adjacent to C2, posterior to the left adjacent to C3, and a little more to the right but central adjacent to C7.  The foci adjacent to C3 and C7 does have subtle enhancement after gadolinium was infused.  There are some degenerative changes at C4-C5, C5-C6 and C6-C7 but no definite nerve root compression and no spinal stenosis.   MRI of the thoracic spine shows diffuse patchy T2 hyperintensity that is predominantly central.   Number spine shows the abnormal signal within the spinal cord adjacent to T10 and T11.  There are degenerative changes at L3-L4, L4-L5 and L5-S1.  There is spinal stenosis at L4-L5.  With possibility for left L4 and L5 nerve root  She had lab work during her hospitalization.  The neuromyelitis optica IgG antibody was very positive at 139 (< 3 normal).  Lumbar puncture showed a small elevation of white blood cell count, predominantly lymphocytes and macrophages.  B12, copper were normal.  Herpes simplex virus and HIV were negative.  ACE was normal.  REVIEW OF SYSTEMS: Constitutional: No fevers, chills, sweats, or change in appetite Eyes: No visual changes, double vision, eye pain Ear, nose and throat: No hearing loss, ear pain, nasal congestion, sore throat Cardiovascular: No chest pain, palpitations Respiratory: No shortness of breath at rest or with exertion.   No wheezes GastrointestinaI: No nausea, vomiting, diarrhea, abdominal pain, fecal incontinence Genitourinary:As above Musculoskeletal: No neck pain, back pain Integumentary: No rash, pruritus, skin lesions Neurological: as above Psychiatric: No depression at this time.  No anxiety Endocrine: No palpitations, diaphoresis, change in appetite, change in weigh or increased thirst Hematologic/Lymphatic: No anemia, purpura, petechiae. Allergic/Immunologic: No itchy/runny eyes, nasal congestion, recent allergic reactions, rashes  ALLERGIES: Allergies  Allergen Reactions   Amitriptyline Other  (See Comments)   Bee Venom Other (See Comments)   Fexofenadine Other (See Comments)   Furosemide Other (See Comments)   Gabapentin Other (See Comments)   Hydroxychloroquine Sulfate Other (See Comments)   Morphine Other (See Comments)   Nsaids    Oxycodone Other (See Comments)    dizzy   Topiramate Other (See Comments)    HOME MEDICATIONS:  Current Outpatient Medications:    acetaminophen (TYLENOL) 325 MG tablet, Take 2 tablets (650 mg total) by mouth every 6 (six) hours as needed for mild pain (or Fever >/= 101)., Disp: , Rfl:    ALPRAZolam (XANAX) 0.5 MG tablet, Take 1 tablet (0.5 mg total) by mouth 3 (three) times daily as needed for anxiety or sleep., Disp: 20 tablet, Rfl: 0   apixaban (ELIQUIS) 5 MG TABS tablet, Take 1 tablet (5 mg total) by mouth 2 (two) times daily., Disp: 60 tablet, Rfl: 1   Cyanocobalamin (VITAMIN B-12 PO), Take 2 tablets by mouth daily., Disp: , Rfl:    darifenacin (ENABLEX) 15 MG 24 hr tablet, Take 1 tablet (15 mg total) by mouth daily., Disp: 30 tablet, Rfl: 5   Eculizumab (SOLIRIS IV), Inject into the vein See admin instructions. 900mg  IV for 4 weeks, 1200mg  at week 5 and then 1200mg  q 2 weeks, Disp: , Rfl:    loratadine (CLARITIN) 10 MG tablet, Take 10 mg by mouth daily., Disp: , Rfl:    oxyCODONE-acetaminophen (PERCOCET) 7.5-325 MG tablet, Take 1 tablet by mouth every 8 (eight) hours as needed., Disp: 30 tablet, Rfl: 0   pantoprazole (PROTONIX) 40 MG tablet, Take 1 tablet (40 mg total) by mouth 2 (two) times daily., Disp: 60 tablet, Rfl: 0   predniSONE (DELTASONE) 20 MG tablet, Take 1 tablet (20 mg total) by mouth daily., Disp: 30 tablet, Rfl: 3   pregabalin (LYRICA) 50 MG capsule, Take 1 capsule (50 mg total) by mouth 3 (three) times daily., Disp: 90 capsule, Rfl: 0   valproic acid (DEPAKENE) 250 MG capsule, Take 1 capsule (250 mg total) by mouth at bedtime., Disp: 30 capsule, Rfl: 1   Vitamin D, Ergocalciferol, (DRISDOL) 1.25 MG  (50000 UT) CAPS capsule, Take 1 capsule (50,000 Units total) by mouth every Monday., Disp: 4 capsule, Rfl: 0   vitamin E 1000 UNIT capsule, Take 1,000 Units by mouth daily., Disp: , Rfl:   PAST MEDICAL HISTORY: Past Medical History:  Diagnosis Date   Chest pain    Depression    DJD (degenerative joint disease)    FH: CVA (cerebrovascular accident)    father   Fibromyalgia    GERD (gastroesophageal reflux disease)    Hypothyroidism    Lupus (HCC)    Migraines    Palpitations    Status post cholecystectomy    appendectomy and bilaateral oophorectomy   Tobacco use     PAST SURGICAL HISTORY: Past Surgical History:  Procedure Laterality Date   BILATERAL OOPHORECTOMY     CARDIAC CATHETERIZATION     CHOLECYSTECTOMY      FAMILY HISTORY: Family History  Problem Relation Age of Onset   Stroke Father     SOCIAL HISTORY:  Social History   Socioeconomic History   Marital status: Single    Spouse name: Not on file   Number of children: 3   Years of education: Not on file   Highest education level: Not on file  Occupational History   Occupation: disability  Social Designer, fashion/clothing strain: Not on file   Food insecurity:    Worry: Not on file    Inability: Not on file   Transportation needs:    Medical: Not on file    Non-medical: Not on file  Tobacco Use   Smoking status: Current Every Day Smoker   Smokeless tobacco: Never Used  Substance and Sexual Activity   Alcohol use: No   Drug use: Never  Sexual activity: Not on file  Lifestyle   Physical activity:    Days per week: Not on file    Minutes per session: Not on file   Stress: Not on file  Relationships   Social connections:    Talks on phone: Not on file    Gets together: Not on file    Attends religious service: Not on file    Active member of club or organization: Not on file    Attends meetings of clubs or organizations: Not on file    Relationship status:  Not on file   Intimate partner violence:    Fear of current or ex partner: Not on file    Emotionally abused: Not on file    Physically abused: Not on file    Forced sexual activity: Not on file  Other Topics Concern   Not on file  Social History Narrative   Friend lives with her   Caffeine use: coffee daily   Left handed      PHYSICAL EXAM  There were no vitals filed for this visit.  There is no height or weight on file to calculate BMI.   General: The patient is well-developed and well-nourished and in no acute distress  Eyes:  Funduscopic exam shows normal optic discs and retinal vessels.  Sclera are anicteric.  Neck: The neck is supple, no carotid bruits are noted.  The neck is nontender.  Cardiovascular: The heart has a regular rate and rhythm with a normal S1 and S2. There were no murmurs, gallops or rubs. Lungs are clear to auscultation.  Skin: Extremities are without significant edema.  Musculoskeletal:  Back is nontender  Neurologic Exam  Mental status: The patient is alert and oriented x 3 at the time of the examination. The patient has apparent normal recent and remote memory, with an apparently normal attention span and concentration ability.   Speech is normal.  Cranial nerves: Extraocular movements are full. Pupils are equal, round, and reactive to light and accomodation.  Visual fields are full.  Facial symmetry is present. There is good facial sensation to soft touch bilaterally.Facial strength is normal.  Trapezius and sternocleidomastoid strength is normal. No dysarthria is noted.  The tongue is midline, and the patient has symmetric elevation of the soft palate. No obvious hearing deficits are noted.  Motor:  Muscle bulk is normal.   Mildly increased muscle tone in the left leg.  Strength was 2/5 in the left leg except 2+/5 in the left quads.  Strength was 3/5 in the right ankle and 4/5 elsewhere in the leg.  Strength was normal in the right arm and 2+/5  in the left deltoid and 4-/5 elsewhere in the arm  Sensory: She reports decrease sensation to touch and temperature in the left arm and leg and decreased vibration sensation in the right leg.  There is also decreased sensation to temperature and touch in the right flank.  Coordination: Cerebellar testing reveals good right finger-nose-finger but she is unable to do left finger-nose-finger.  Reduced right heel-to-shin and unable to do left  Gait and station: She cannot stand or walk.  Reflexes: Deep tendon reflexes are mildly increased in the left leg relative to the right.  No clonus.    DIAGNOSTIC DATA (LABS, IMAGING, TESTING) - I reviewed patient records, labs, notes, testing and imaging myself where available.  Lab Results  Component Value Date   WBC 9.6 08/09/2018   HGB 11.8 (L) 08/09/2018   HCT 35.1 (  L) 08/09/2018   MCV 92.6 08/09/2018   PLT 154 08/09/2018      Component Value Date/Time   NA 134 (L) 08/09/2018 1015   K 3.9 08/09/2018 1015   CL 99 08/09/2018 1015   CO2 27 08/09/2018 1015   GLUCOSE 100 (H) 08/09/2018 1015   BUN 16 08/09/2018 1015   CREATININE 0.77 08/09/2018 1015   CALCIUM 8.4 (L) 08/09/2018 1015   PROT 5.2 (L) 08/04/2018 0613   ALBUMIN 2.6 (L) 08/04/2018 0613   AST 18 08/04/2018 0613   ALT 19 08/04/2018 0613   ALKPHOS 42 08/04/2018 0613   BILITOT 0.4 08/04/2018 0613   GFRNONAA >60 08/09/2018 1015   GFRAA >60 08/09/2018 1015    No results found for: HGBA1C Lab Results  Component Value Date   VITAMINB12 1,457 (H) 08/01/2018      Merin Borjon A. Felecia Shelling, MD, [facility] 08/11/1446, 1:85 PM Certified in Neurology, Clinical Neurophysiology, Sleep Medicine, Pain Medicine and Neuroimaging  [facility] [address] [place], [place] [address] [phone]

## 2018-09-24 DIAGNOSIS — M797 Fibromyalgia: Secondary | ICD-10-CM | POA: Diagnosis not present

## 2018-09-24 DIAGNOSIS — R69 Illness, unspecified: Secondary | ICD-10-CM | POA: Diagnosis not present

## 2018-09-24 DIAGNOSIS — Z6823 Body mass index (BMI) 23.0-23.9, adult: Secondary | ICD-10-CM | POA: Diagnosis not present

## 2018-09-24 DIAGNOSIS — K219 Gastro-esophageal reflux disease without esophagitis: Secondary | ICD-10-CM | POA: Diagnosis not present

## 2018-09-24 DIAGNOSIS — Z299 Encounter for prophylactic measures, unspecified: Secondary | ICD-10-CM | POA: Diagnosis not present

## 2018-09-24 DIAGNOSIS — R51 Headache: Secondary | ICD-10-CM | POA: Diagnosis not present

## 2018-09-28 ENCOUNTER — Telehealth: Payer: Self-pay | Admitting: *Deleted

## 2018-09-28 NOTE — Telephone Encounter (Signed)
Received fax notification dated 09/23/2018 from [employer] that [place] soliris approved. Effective until 03/26/2019. Auth#: [id number].  Gave copy to [facility] for their records since they did [place].

## 2018-09-30 ENCOUNTER — Telehealth: Payer: Self-pay | Admitting: *Deleted

## 2018-09-30 NOTE — Telephone Encounter (Signed)
Received notice that wrong form filled out in error. They have reached out to daughter, [name] and have emailed her correct form to be filled out and signed.   I advised medication already approved and she will be getting infusion here with [facility]. She is scheduled 10/05/18 at 3pm for infusion. She got meningitis vaccine 09/21/18 and had to wait at least 2 weeks after this to get first infusion.  I also asked that she call Lahoma Rocker, RN w/ [facility] to do inservice. Provided phone number to Daphine Deutscher, RN BSN (pt care coordinator w/ soliris)

## 2018-09-30 NOTE — Telephone Encounter (Signed)
Re-faxed soliris enrollment form to [email or web address] had faxed them to the fax number on the form [phone] but received message they did not receive. I re-sent to this number as well as number they provieded for [employer] is at [phone]. I received fax confirmation for both.

## 2018-10-05 DIAGNOSIS — G0491 Myelitis, unspecified: Secondary | ICD-10-CM | POA: Diagnosis not present

## 2018-10-05 DIAGNOSIS — G360 Neuromyelitis optica [Devic]: Secondary | ICD-10-CM | POA: Diagnosis not present

## 2018-10-06 ENCOUNTER — Telehealth: Payer: Self-pay | Admitting: *Deleted

## 2018-10-06 NOTE — Telephone Encounter (Addendum)
Per Liane, RN ([facility]) pt called today to report she has some leg spasms, swelling. She had her first soliris infusion yesterday. She also wanted to know if she should continue the prednisone that Dr. Felecia Shelling prescribed.   I spoke with Dr. Felecia Shelling and he recommends she continue prednisone for another week. After 1 week she can decrease to 1/2 tablet daily for 1 week and stop. She should continue to monitor her sx. If they worsen or if she has new sx she should let us know.  I called daughter, [name]. Relayed above info. She verbalized understanding and will let her mother know. She is scheduled next week for her next Soliris infusion.

## 2018-10-07 DIAGNOSIS — B029 Zoster without complications: Secondary | ICD-10-CM | POA: Diagnosis not present

## 2018-10-07 DIAGNOSIS — G43709 Chronic migraine without aura, not intractable, without status migrainosus: Secondary | ICD-10-CM | POA: Diagnosis not present

## 2018-10-07 DIAGNOSIS — M130 Polyarthritis, unspecified: Secondary | ICD-10-CM | POA: Diagnosis not present

## 2018-10-07 DIAGNOSIS — M545 Low back pain: Secondary | ICD-10-CM | POA: Diagnosis not present

## 2018-10-11 NOTE — Telephone Encounter (Signed)
Called pt back. Relayed Dr. Garth Bigness recommendation. She was agreeable to this and will try this increased dose. She will call back if further questions/concerns.

## 2018-10-11 NOTE — Telephone Encounter (Signed)
If she is tolerating the baclofen well.  She can go up to 4 pills a day.

## 2018-10-11 NOTE — Telephone Encounter (Signed)
Took call from phone staff. Spoke with pt. She states she is still having muscle spasms after her first infusion of Soliris. She is having heaviness in left leg that was not there prior to infusion. Left side of body at waistline has numbness/pain that is constant. Her left foot turns blue by toes when she sits. If she walks around, it resolves. She is scheduled for next infusion 10/13/18. Yesterday her spasms were severe. Today, they are better. She has only had 4 episodes. When she has spasms, her foot draws up and then her arm. She is taking baclofen 10mg  TID po.   She wants to know if Dr. Felecia Shelling recommends she continue course of treatment or if something else should be done.

## 2018-10-13 DIAGNOSIS — G360 Neuromyelitis optica [Devic]: Secondary | ICD-10-CM | POA: Diagnosis not present

## 2018-10-13 DIAGNOSIS — G0491 Myelitis, unspecified: Secondary | ICD-10-CM | POA: Diagnosis not present

## 2018-10-14 ENCOUNTER — Telehealth: Payer: Self-pay | Admitting: Physical Medicine & Rehabilitation

## 2018-10-14 DIAGNOSIS — G8389 Other specified paralytic syndromes: Secondary | ICD-10-CM

## 2018-10-14 DIAGNOSIS — G0491 Myelitis, unspecified: Secondary | ICD-10-CM

## 2018-10-14 DIAGNOSIS — G360 Neuromyelitis optica [Devic]: Secondary | ICD-10-CM

## 2018-10-14 NOTE — Telephone Encounter (Signed)
I see an order for outpatient PT OT, does the patient not have transportation.  Not sure what has changed

## 2018-10-14 NOTE — Telephone Encounter (Signed)
Patient's daughter in law Barnie Alderman called to see if patient could get a referral for [facility] to come back out to patient's home.  Please call Barnie Alderman at [phone].

## 2018-10-15 NOTE — Telephone Encounter (Signed)
Please make referral to home health PT and OT, weakness due to neuromyelitis optica

## 2018-10-15 NOTE — Telephone Encounter (Signed)
She has had a lot of setbacks related to her auto immune disorder that she is seeing Dr Felecia Shelling about, and has deteriorated quite a bit. That is why they would like HH to come back out and see her vs going to outpt rehab.

## 2018-10-18 DIAGNOSIS — R2689 Other abnormalities of gait and mobility: Secondary | ICD-10-CM | POA: Diagnosis not present

## 2018-10-18 NOTE — Telephone Encounter (Signed)
Orders placed, 

## 2018-10-20 DIAGNOSIS — G360 Neuromyelitis optica [Devic]: Secondary | ICD-10-CM | POA: Diagnosis not present

## 2018-10-20 DIAGNOSIS — G0491 Myelitis, unspecified: Secondary | ICD-10-CM | POA: Diagnosis not present

## 2018-10-26 DIAGNOSIS — Z299 Encounter for prophylactic measures, unspecified: Secondary | ICD-10-CM | POA: Diagnosis not present

## 2018-10-26 DIAGNOSIS — B029 Zoster without complications: Secondary | ICD-10-CM | POA: Diagnosis not present

## 2018-10-26 DIAGNOSIS — Z6823 Body mass index (BMI) 23.0-23.9, adult: Secondary | ICD-10-CM | POA: Diagnosis not present

## 2018-10-26 DIAGNOSIS — I82402 Acute embolism and thrombosis of unspecified deep veins of left lower extremity: Secondary | ICD-10-CM | POA: Diagnosis not present

## 2018-10-26 DIAGNOSIS — Z713 Dietary counseling and surveillance: Secondary | ICD-10-CM | POA: Diagnosis not present

## 2018-11-01 ENCOUNTER — Telehealth: Payer: Self-pay | Admitting: Neurology

## 2018-11-01 NOTE — Telephone Encounter (Signed)
This is a late entry,  Patient complains of 2 to 3 days history of right V1 shingles breakout prior to her scheduled Soliris infusion for neuromyelitis optica, she still have significant rash and paresthesia at the right V1 distribution, also complains of blurry vision, was seen by ophthalmologist,   After discussed with patient, we have decided to defer her Soliris treatment for right now, until she is totally recovered from her shingles.

## 2018-11-04 NOTE — Telephone Encounter (Signed)
I spoke with Joaquim Lai.   She is about the same.   I will get more info on Soliris and shingles and we will get back to her next week to reschedule

## 2018-11-17 DIAGNOSIS — R2689 Other abnormalities of gait and mobility: Secondary | ICD-10-CM | POA: Diagnosis not present

## 2018-11-19 ENCOUNTER — Telehealth: Payer: Self-pay | Admitting: Neurology

## 2018-11-19 DIAGNOSIS — Z0000 Encounter for general adult medical examination without abnormal findings: Secondary | ICD-10-CM | POA: Diagnosis not present

## 2018-11-19 DIAGNOSIS — Z299 Encounter for prophylactic measures, unspecified: Secondary | ICD-10-CM | POA: Diagnosis not present

## 2018-11-19 DIAGNOSIS — I82402 Acute embolism and thrombosis of unspecified deep veins of left lower extremity: Secondary | ICD-10-CM | POA: Diagnosis not present

## 2018-11-19 DIAGNOSIS — Z1211 Encounter for screening for malignant neoplasm of colon: Secondary | ICD-10-CM | POA: Diagnosis not present

## 2018-11-19 DIAGNOSIS — Z7189 Other specified counseling: Secondary | ICD-10-CM | POA: Diagnosis not present

## 2018-11-19 DIAGNOSIS — Z1331 Encounter for screening for depression: Secondary | ICD-10-CM | POA: Diagnosis not present

## 2018-11-19 DIAGNOSIS — R69 Illness, unspecified: Secondary | ICD-10-CM | POA: Diagnosis not present

## 2018-11-19 DIAGNOSIS — Z6823 Body mass index (BMI) 23.0-23.9, adult: Secondary | ICD-10-CM | POA: Diagnosis not present

## 2018-11-19 DIAGNOSIS — Z1339 Encounter for screening examination for other mental health and behavioral disorders: Secondary | ICD-10-CM | POA: Diagnosis not present

## 2018-11-19 NOTE — Telephone Encounter (Signed)
I called and went to VM and left message.

## 2018-11-22 NOTE — Telephone Encounter (Signed)
Patient was doing better.  We will restart the Soliris.  Traveling to the office every 2 weeks is difficult.  Once we know that she is stable, we could consider home infusions.  She also has constipation.  She is already taking MiraLAX and a stool softener.  We discussed holding the Enablex to see if that helps.  We also could back off on the baclofen.

## 2018-11-30 ENCOUNTER — Telehealth: Payer: Self-pay | Admitting: *Deleted

## 2018-11-30 DIAGNOSIS — G0491 Myelitis, unspecified: Secondary | ICD-10-CM | POA: Diagnosis not present

## 2018-11-30 DIAGNOSIS — G360 Neuromyelitis optica [Devic]: Secondary | ICD-10-CM | POA: Diagnosis not present

## 2018-11-30 NOTE — Telephone Encounter (Signed)
Pt here today for Soliris infusion. Wants to switch to home infusions if possible. Ok per Dr. Felecia Shelling.   I called and spoke with Tiffany with [facility]. They cannot do infusion, drug not on list of approved drugs for them.  I messaged Cherene Altes (drug rep with Soliris) to see if they have any options for specialty infusion groups to refer pt to.   I called [facility] with express scripts at [phone]. Spoke with Estill Bamberg. She transferred me to department that handles Soliris infusions. Spoke with Hassan Rowan (pt care advocate). Need to call pt insurance to see if she can fill through express scripts with [facility]. If in network, fax prescription, demographic, insurance, let them know she wants to use home nursing, already on medication, when next infusion is due. They need 3 days to ship medication at least- they have to order medication). They can verify nursing benefits.  Need to know if pt is using pump, what kind of pump (covered under medical benefits), need rx for pump. Need a supply order (pharmacy) and nursing order. Nursing requires epi pen in home (need rx). Fax: [phone] or [phone]. Direct number: [phone].

## 2018-11-30 NOTE — Telephone Encounter (Signed)
Called and spoke with Susan Hodge (case manager for [facility]) at [phone]. She reviewed patient's benefit investigation and she is not eligible for home infusions. Medicare does not support home infusions and she does not have a part D plan. Even if she had a part D plan she would be responsible for 5% of the cost and there is no current pt assistance for this.   I called patient and relayed above information. She verbalized understanding and appreciation.   She is coming next week for week 5 of infusion: first dose of 1200mg 

## 2018-12-01 DIAGNOSIS — M545 Low back pain: Secondary | ICD-10-CM | POA: Diagnosis not present

## 2018-12-01 DIAGNOSIS — M542 Cervicalgia: Secondary | ICD-10-CM | POA: Diagnosis not present

## 2018-12-01 DIAGNOSIS — M130 Polyarthritis, unspecified: Secondary | ICD-10-CM | POA: Diagnosis not present

## 2018-12-01 DIAGNOSIS — M79602 Pain in left arm: Secondary | ICD-10-CM | POA: Diagnosis not present

## 2018-12-01 DIAGNOSIS — G43709 Chronic migraine without aura, not intractable, without status migrainosus: Secondary | ICD-10-CM | POA: Diagnosis not present

## 2018-12-01 DIAGNOSIS — B029 Zoster without complications: Secondary | ICD-10-CM | POA: Diagnosis not present

## 2018-12-01 DIAGNOSIS — M79605 Pain in left leg: Secondary | ICD-10-CM | POA: Diagnosis not present

## 2018-12-01 DIAGNOSIS — Z79891 Long term (current) use of opiate analgesic: Secondary | ICD-10-CM | POA: Diagnosis not present

## 2018-12-07 DIAGNOSIS — Z0000 Encounter for general adult medical examination without abnormal findings: Secondary | ICD-10-CM | POA: Diagnosis not present

## 2018-12-07 DIAGNOSIS — Z79899 Other long term (current) drug therapy: Secondary | ICD-10-CM | POA: Diagnosis not present

## 2018-12-07 DIAGNOSIS — I82402 Acute embolism and thrombosis of unspecified deep veins of left lower extremity: Secondary | ICD-10-CM | POA: Diagnosis not present

## 2018-12-07 DIAGNOSIS — R69 Illness, unspecified: Secondary | ICD-10-CM | POA: Diagnosis not present

## 2018-12-08 DIAGNOSIS — G0491 Myelitis, unspecified: Secondary | ICD-10-CM | POA: Diagnosis not present

## 2018-12-08 DIAGNOSIS — G360 Neuromyelitis optica [Devic]: Secondary | ICD-10-CM | POA: Diagnosis not present

## 2018-12-08 NOTE — Telephone Encounter (Signed)
Tommie called back from Day surgery infusion center at [facility]. They are not familiar with infusing Soliris but willing to be in contact with nurse educator before transitioning pt there. Graylon Gunning, RN spoke with nurse about infusion process.  She is going to try and set up in-service to train nurses there. I provided her Loman [place] contact info to try and set up in-service to train.  No one nurse dedicated at infusion center and she wants to make sure everyone is trained. She also needs to talk with pharmacist to see if they can mix medication. She will get back with me by next Wednesday (12/15/2018). She has [facility] phone number

## 2018-12-08 NOTE — Telephone Encounter (Signed)
Pt here for Soliris infusion today. She is interested in being transferred to [facility] for infusions since this would be closer to her home.  I called [facility] at [phone], spoke with Amy at cancer center. She thinks day surgery does infusions. She transferred me there but I had to LVM for them to call back. Provided [phone] for call back number. Advised pt already established on Soliris.

## 2018-12-14 NOTE — Telephone Encounter (Signed)
Tommie called back from [facility]. They are going to try and set up in-service for their staff with Cherene Altes (rep for soliris). Verified with pharmacy they can receive medication. She will email me with update once she knows inservice date to better know when patient can transfer to their location.

## 2018-12-18 DIAGNOSIS — R2689 Other abnormalities of gait and mobility: Secondary | ICD-10-CM | POA: Diagnosis not present

## 2018-12-21 NOTE — Telephone Encounter (Signed)
In process of trying to figure out if her insurance will allow hospital setting infusion versus outpt office setting here at [facility]

## 2018-12-22 DIAGNOSIS — G0491 Myelitis, unspecified: Secondary | ICD-10-CM | POA: Diagnosis not present

## 2018-12-22 DIAGNOSIS — G360 Neuromyelitis optica [Devic]: Secondary | ICD-10-CM | POA: Diagnosis not present

## 2018-12-28 NOTE — Telephone Encounter (Addendum)
Called [facility] and spoke with [place]. States PA Soliris approved under Medical buy/bill. Should only need to change facility location. Provider line: [phone]. They will be able to fax approval letter on file. She transferred me to Medical department. Spoke with Veleta Miners F who transferred me to another department. Spoke with Helene Kelp was unable to look up [employer] approval, place me on hold. I provided her J code: [id number] to try and look up further info. She transferred me to [phone]. Call ended on their end. I called number back and spoke with representative. They were able to change facility location to [facility] for infusion. Insurance approval effective 12/28/18-03/26/19. They will fax approval letter to [phone]. I sent message to Tomi, RN at [facility] to call pt to get scheduled at their location for infusion next week since her last infusion was last week.

## 2018-12-29 NOTE — Telephone Encounter (Signed)
Faxed signed orders for [employer], Lawyer, original start form to [facility] at [phone]. Received fax confirmation. Advised pt last infusion 12/22/18 and next one due around 01/05/19 and to contact pt to schedule. Marked urgent

## 2018-12-29 NOTE — Telephone Encounter (Signed)
Called, LVM for [facility] (infusion) at [phone] asking Tomi M,RN call back or one of the nurses to discuss pt

## 2018-12-29 NOTE — Telephone Encounter (Signed)
Called number again to [facility]. They transferred me to short stay and I spoke with Hoyle Sauer. Tomi already gone for the day. She will send message for her to get in the morning to call me back. Aware we are closed on Friday.  Fax#: [phone].

## 2018-12-30 NOTE — Telephone Encounter (Signed)
Took call from phone staff. Spoke with Tomi. They still need to set up inservice with Rebekah (rep with soliris) to be trained on how to infuse. They were waiting to know for sure if patient was transitioning. They will try to set this up for early next week and contact pt to get scheduled. She verified she received my fax from yesterday.

## 2018-12-30 NOTE — Telephone Encounter (Signed)
Tomi called back. They have zoom meeting set up for next Wednesday for inservice with RN's/pharmacy for Soliris. Pt scheduled for Friday. They tried to reach pt but unable. They will keep trying to go over scheduling/how to get to their location. Advised Liane,RN also tried reaching her but VM full. Nothing further needed.

## 2018-12-31 ENCOUNTER — Inpatient Hospital Stay (HOSPITAL_COMMUNITY)
Admission: EM | Admit: 2018-12-31 | Discharge: 2019-01-03 | DRG: 058 | Disposition: A | Discharge status: Home / Self Care | Payer: Medicare HMO | Attending: Family Medicine | Admitting: Family Medicine

## 2018-12-31 ENCOUNTER — Encounter (HOSPITAL_COMMUNITY): Payer: Self-pay | Admitting: Emergency Medicine

## 2018-12-31 ENCOUNTER — Other Ambulatory Visit: Payer: Self-pay

## 2018-12-31 ENCOUNTER — Inpatient Hospital Stay (HOSPITAL_COMMUNITY): Payer: Medicare HMO

## 2018-12-31 ENCOUNTER — Emergency Department (HOSPITAL_COMMUNITY): Payer: Medicare HMO

## 2018-12-31 DIAGNOSIS — M1990 Unspecified osteoarthritis, unspecified site: Secondary | ICD-10-CM | POA: Diagnosis present

## 2018-12-31 DIAGNOSIS — G8929 Other chronic pain: Secondary | ICD-10-CM | POA: Diagnosis present

## 2018-12-31 DIAGNOSIS — M797 Fibromyalgia: Secondary | ICD-10-CM | POA: Diagnosis present

## 2018-12-31 DIAGNOSIS — K922 Gastrointestinal hemorrhage, unspecified: Secondary | ICD-10-CM | POA: Diagnosis not present

## 2018-12-31 DIAGNOSIS — K5521 Angiodysplasia of colon with hemorrhage: Secondary | ICD-10-CM | POA: Diagnosis not present

## 2018-12-31 DIAGNOSIS — K649 Unspecified hemorrhoids: Secondary | ICD-10-CM | POA: Diagnosis not present

## 2018-12-31 DIAGNOSIS — Z888 Allergy status to other drugs, medicaments and biological substances status: Secondary | ICD-10-CM | POA: Diagnosis not present

## 2018-12-31 DIAGNOSIS — Z91030 Bee allergy status: Secondary | ICD-10-CM | POA: Diagnosis not present

## 2018-12-31 DIAGNOSIS — G360 Neuromyelitis optica [Devic]: Principal | ICD-10-CM | POA: Diagnosis present

## 2018-12-31 DIAGNOSIS — Z886 Allergy status to analgesic agent status: Secondary | ICD-10-CM | POA: Diagnosis not present

## 2018-12-31 DIAGNOSIS — G43909 Migraine, unspecified, not intractable, without status migrainosus: Secondary | ICD-10-CM | POA: Diagnosis present

## 2018-12-31 DIAGNOSIS — Z20828 Contact with and (suspected) exposure to other viral communicable diseases: Secondary | ICD-10-CM | POA: Diagnosis not present

## 2018-12-31 DIAGNOSIS — G8194 Hemiplegia, unspecified affecting left nondominant side: Secondary | ICD-10-CM | POA: Diagnosis present

## 2018-12-31 DIAGNOSIS — M5136 Other intervertebral disc degeneration, lumbar region: Secondary | ICD-10-CM | POA: Diagnosis not present

## 2018-12-31 DIAGNOSIS — K219 Gastro-esophageal reflux disease without esophagitis: Secondary | ICD-10-CM | POA: Diagnosis present

## 2018-12-31 DIAGNOSIS — R109 Unspecified abdominal pain: Secondary | ICD-10-CM

## 2018-12-31 DIAGNOSIS — D128 Benign neoplasm of rectum: Secondary | ICD-10-CM | POA: Diagnosis not present

## 2018-12-31 DIAGNOSIS — K862 Cyst of pancreas: Secondary | ICD-10-CM | POA: Diagnosis not present

## 2018-12-31 DIAGNOSIS — M4004 Postural kyphosis, thoracic region: Secondary | ICD-10-CM | POA: Diagnosis not present

## 2018-12-31 DIAGNOSIS — D62 Acute posthemorrhagic anemia: Secondary | ICD-10-CM | POA: Diagnosis present

## 2018-12-31 DIAGNOSIS — K621 Rectal polyp: Secondary | ICD-10-CM | POA: Diagnosis present

## 2018-12-31 DIAGNOSIS — Z86718 Personal history of other venous thrombosis and embolism: Secondary | ICD-10-CM | POA: Diagnosis not present

## 2018-12-31 DIAGNOSIS — K5730 Diverticulosis of large intestine without perforation or abscess without bleeding: Secondary | ICD-10-CM | POA: Diagnosis not present

## 2018-12-31 DIAGNOSIS — E039 Hypothyroidism, unspecified: Secondary | ICD-10-CM | POA: Diagnosis present

## 2018-12-31 DIAGNOSIS — M549 Dorsalgia, unspecified: Secondary | ICD-10-CM | POA: Diagnosis not present

## 2018-12-31 DIAGNOSIS — R29703 NIHSS score 3: Secondary | ICD-10-CM | POA: Diagnosis present

## 2018-12-31 DIAGNOSIS — Z823 Family history of stroke: Secondary | ICD-10-CM

## 2018-12-31 DIAGNOSIS — K921 Melena: Secondary | ICD-10-CM | POA: Diagnosis not present

## 2018-12-31 DIAGNOSIS — K8689 Other specified diseases of pancreas: Secondary | ICD-10-CM | POA: Diagnosis present

## 2018-12-31 DIAGNOSIS — Z885 Allergy status to narcotic agent status: Secondary | ICD-10-CM | POA: Diagnosis not present

## 2018-12-31 DIAGNOSIS — K635 Polyp of colon: Secondary | ICD-10-CM | POA: Diagnosis not present

## 2018-12-31 DIAGNOSIS — K6389 Other specified diseases of intestine: Secondary | ICD-10-CM | POA: Diagnosis not present

## 2018-12-31 DIAGNOSIS — K228 Other specified diseases of esophagus: Secondary | ICD-10-CM | POA: Diagnosis not present

## 2018-12-31 DIAGNOSIS — N390 Urinary tract infection, site not specified: Secondary | ICD-10-CM | POA: Diagnosis not present

## 2018-12-31 DIAGNOSIS — K644 Residual hemorrhoidal skin tags: Secondary | ICD-10-CM | POA: Diagnosis present

## 2018-12-31 DIAGNOSIS — M5020 Other cervical disc displacement, unspecified cervical region: Secondary | ICD-10-CM | POA: Diagnosis not present

## 2018-12-31 DIAGNOSIS — K5520 Angiodysplasia of colon without hemorrhage: Secondary | ICD-10-CM | POA: Diagnosis not present

## 2018-12-31 DIAGNOSIS — G369 Acute disseminated demyelination, unspecified: Secondary | ICD-10-CM | POA: Diagnosis not present

## 2018-12-31 DIAGNOSIS — M329 Systemic lupus erythematosus, unspecified: Secondary | ICD-10-CM | POA: Diagnosis present

## 2018-12-31 DIAGNOSIS — R52 Pain, unspecified: Secondary | ICD-10-CM | POA: Diagnosis not present

## 2018-12-31 DIAGNOSIS — M4802 Spinal stenosis, cervical region: Secondary | ICD-10-CM | POA: Diagnosis not present

## 2018-12-31 DIAGNOSIS — G8190 Hemiplegia, unspecified affecting unspecified side: Secondary | ICD-10-CM

## 2018-12-31 DIAGNOSIS — R531 Weakness: Secondary | ICD-10-CM | POA: Diagnosis not present

## 2018-12-31 DIAGNOSIS — E162 Hypoglycemia, unspecified: Secondary | ICD-10-CM | POA: Diagnosis not present

## 2018-12-31 DIAGNOSIS — K625 Hemorrhage of anus and rectum: Secondary | ICD-10-CM | POA: Diagnosis not present

## 2018-12-31 DIAGNOSIS — I959 Hypotension, unspecified: Secondary | ICD-10-CM | POA: Diagnosis not present

## 2018-12-31 DIAGNOSIS — D123 Benign neoplasm of transverse colon: Secondary | ICD-10-CM | POA: Diagnosis not present

## 2018-12-31 DIAGNOSIS — F17210 Nicotine dependence, cigarettes, uncomplicated: Secondary | ICD-10-CM | POA: Diagnosis present

## 2018-12-31 HISTORY — DX: Systemic involvement of connective tissue, unspecified: M35.9

## 2018-12-31 HISTORY — DX: Other complications of anesthesia, initial encounter: T88.59XA

## 2018-12-31 LAB — PROTIME-INR
INR: 1.3 — ABNORMAL HIGH (ref 0.8–1.2)
Prothrombin Time: 15.9 seconds — ABNORMAL HIGH (ref 11.4–15.2)

## 2018-12-31 LAB — COMPREHENSIVE METABOLIC PANEL
ALT: 9 U/L (ref 0–44)
AST: 15 U/L (ref 15–41)
Albumin: 3.2 g/dL — ABNORMAL LOW (ref 3.5–5.0)
Alkaline Phosphatase: 42 U/L (ref 38–126)
Anion gap: 6 (ref 5–15)
BUN: 14 mg/dL (ref 8–23)
CO2: 25 mmol/L (ref 22–32)
Calcium: 8.6 mg/dL — ABNORMAL LOW (ref 8.9–10.3)
Chloride: 110 mmol/L (ref 98–111)
Creatinine, Ser: 0.96 mg/dL (ref 0.44–1.00)
GFR calc Af Amer: >60 mL/min (ref >60)
GFR calc non Af Amer: >60 mL/min (ref >60)
Glucose, Bld: 92 mg/dL (ref 70–99)
Potassium: 4.2 mmol/L (ref 3.5–5.1)
Sodium: 141 mmol/L (ref 135–145)
Total Bilirubin: 0.5 mg/dL (ref 0.3–1.2)
Total Protein: 6.1 g/dL — ABNORMAL LOW (ref 6.5–8.1)

## 2018-12-31 LAB — CBC WITH DIFFERENTIAL/PLATELET
Abs Immature Granulocytes: 0.01 10*3/uL (ref 0.00–0.07)
Basophils Absolute: 0 10*3/uL (ref 0.0–0.1)
Basophils Relative: 0 %
Eosinophils Absolute: 0.1 10*3/uL (ref 0.0–0.5)
Eosinophils Relative: 1 %
HCT: 26.9 % — ABNORMAL LOW (ref 36.0–46.0)
Hemoglobin: 8.3 g/dL — ABNORMAL LOW (ref 12.0–15.0)
Immature Granulocytes: 0 %
Lymphocytes Relative: 20 %
Lymphs Abs: 1 10*3/uL (ref 0.7–4.0)
MCH: 26.9 pg (ref 26.0–34.0)
MCHC: 30.9 g/dL (ref 30.0–36.0)
MCV: 87.3 fL (ref 80.0–100.0)
Monocytes Absolute: 0.3 10*3/uL (ref 0.1–1.0)
Monocytes Relative: 7 %
Neutro Abs: 3.5 10*3/uL (ref 1.7–7.7)
Neutrophils Relative %: 72 %
Platelets: 155 10*3/uL (ref 150–400)
RBC: 3.08 MIL/uL — ABNORMAL LOW (ref 3.87–5.11)
RDW: 14.8 % (ref 11.5–15.5)
WBC: 4.9 10*3/uL (ref 4.0–10.5)
nRBC: 0 % (ref 0.0–0.2)

## 2018-12-31 LAB — URINALYSIS, ROUTINE W REFLEX MICROSCOPIC
Bilirubin Urine: NEGATIVE
Glucose, UA: NEGATIVE mg/dL
Hgb urine dipstick: NEGATIVE
Ketones, ur: 5 mg/dL — AB
Nitrite: POSITIVE — AB
Protein, ur: NEGATIVE mg/dL
Specific Gravity, Urine: 1.024 (ref 1.005–1.030)
pH: 5 (ref 5.0–8.0)

## 2018-12-31 LAB — LACTIC ACID, PLASMA
Lactic Acid, Venous: 0.6 mmol/L (ref 0.5–1.9)
Lactic Acid, Venous: 1.1 mmol/L (ref 0.5–1.9)

## 2018-12-31 LAB — TROPONIN I (HIGH SENSITIVITY)
Troponin I (High Sensitivity): 3 ng/L (ref <18)
Troponin I (High Sensitivity): 3 ng/L (ref <18)

## 2018-12-31 LAB — GLUCOSE, CAPILLARY: Glucose-Capillary: 105 mg/dL — ABNORMAL HIGH (ref 70–99)

## 2018-12-31 LAB — LIPASE, BLOOD: Lipase: 20 U/L (ref 11–51)

## 2018-12-31 LAB — OCCULT BLOOD X 1 CARD TO LAB, STOOL: Fecal Occult Bld: POSITIVE — AB

## 2018-12-31 LAB — SARS CORONAVIRUS 2 BY RT PCR (HOSPITAL ORDER, PERFORMED IN ~~LOC~~ HOSPITAL LAB): SARS Coronavirus 2: NEGATIVE

## 2018-12-31 MED ORDER — OXYCODONE-ACETAMINOPHEN 5-325 MG PO TABS
1.0000 | ORAL_TABLET | ORAL | Status: DC | PRN
  Administered 2018-12-31 – 2019-01-01 (×2): 2 via ORAL
  Administered 2019-01-01: 1 via ORAL
  Administered 2019-01-01: 2 via ORAL
  Administered 2019-01-03: 1 via ORAL
  Filled 2018-12-31 (×2): qty 2
  Filled 2018-12-31: qty 1
  Filled 2018-12-31 (×2): qty 2

## 2018-12-31 MED ORDER — POLYETHYLENE GLYCOL 3350 17 G PO PACK: 17.0000 g | PACK | Freq: Every day | ORAL | Status: DC | PRN

## 2018-12-31 MED ORDER — SODIUM CHLORIDE 0.9 % IV SOLN
INTRAVENOUS | Status: DC
  Administered 2018-12-31 – 2019-01-01 (×2): via INTRAVENOUS

## 2018-12-31 MED ORDER — VITAMIN D (ERGOCALCIFEROL) 1.25 MG (50000 UNIT) PO CAPS
50000.0000 [IU] | ORAL_CAPSULE | ORAL | Status: DC
  Administered 2019-01-03: 50000 [IU] via ORAL
  Filled 2018-12-31: qty 1

## 2018-12-31 MED ORDER — HYDROMORPHONE HCL 1 MG/ML IJ SOLN
0.5000 mg | Freq: Once | INTRAMUSCULAR | Status: AC
  Administered 2018-12-31: 0.5 mg via INTRAVENOUS
  Filled 2018-12-31: qty 1

## 2018-12-31 MED ORDER — IOHEXOL 300 MG/ML  SOLN
75.0000 mL | Freq: Once | INTRAMUSCULAR | Status: AC | PRN
  Administered 2018-12-31: 75 mL via INTRAVENOUS

## 2018-12-31 MED ORDER — ACETAMINOPHEN 650 MG RE SUPP: 650.0000 mg | Freq: Four times a day (QID) | RECTAL | Status: DC | PRN

## 2018-12-31 MED ORDER — LORATADINE 10 MG PO TABS
10.0000 mg | ORAL_TABLET | Freq: Every day | ORAL | Status: DC
  Administered 2019-01-01 – 2019-01-03 (×3): 10 mg via ORAL
  Filled 2018-12-31 (×3): qty 1

## 2018-12-31 MED ORDER — LIDOCAINE 5 % EX PTCH
1.0000 | MEDICATED_PATCH | Freq: Every morning | CUTANEOUS | Status: DC
  Administered 2019-01-01 – 2019-01-03 (×3): 1 via TRANSDERMAL
  Filled 2018-12-31 (×3): qty 1

## 2018-12-31 MED ORDER — SODIUM CHLORIDE 0.9% FLUSH: 3.0000 mL | INTRAVENOUS | Status: DC | PRN

## 2018-12-31 MED ORDER — SODIUM CHLORIDE 0.9 % IV SOLN
1000.0000 mg | Freq: Once | INTRAVENOUS | Status: AC
  Administered 2018-12-31: 1000 mg via INTRAVENOUS
  Filled 2018-12-31: qty 1000

## 2018-12-31 MED ORDER — LIDOCAINE 5 % EX PTCH: 1.0000 | MEDICATED_PATCH | Freq: Two times a day (BID) | CUTANEOUS | Status: DC

## 2018-12-31 MED ORDER — INSULIN ASPART 100 UNIT/ML ~~LOC~~ SOLN: 0.0000 [IU] | Freq: Every day | SUBCUTANEOUS | Status: DC

## 2018-12-31 MED ORDER — SODIUM CHLORIDE 0.9 % IV SOLN
1.0000 g | INTRAVENOUS | Status: DC
  Administered 2018-12-31 – 2019-01-03 (×4): 1 g via INTRAVENOUS
  Filled 2018-12-31 (×4): qty 10

## 2018-12-31 MED ORDER — DARIFENACIN HYDROBROMIDE ER 7.5 MG PO TB24
15.0000 mg | ORAL_TABLET | Freq: Every day | ORAL | Status: DC
  Administered 2019-01-01 – 2019-01-03 (×3): 15 mg via ORAL
  Filled 2018-12-31 (×2): qty 1
  Filled 2018-12-31: qty 2
  Filled 2018-12-31: qty 1

## 2018-12-31 MED ORDER — ONDANSETRON HCL 4 MG/2ML IJ SOLN
4.0000 mg | Freq: Once | INTRAMUSCULAR | Status: AC
  Administered 2018-12-31: 4 mg via INTRAVENOUS
  Filled 2018-12-31: qty 2

## 2018-12-31 MED ORDER — SODIUM CHLORIDE 0.9 % IV SOLN
1000.0000 mg | Freq: Every day | INTRAVENOUS | Status: DC
  Filled 2018-12-31 (×3): qty 8

## 2018-12-31 MED ORDER — ALPRAZOLAM 0.5 MG PO TABS
0.5000 mg | ORAL_TABLET | Freq: Three times a day (TID) | ORAL | Status: DC | PRN
  Administered 2018-12-31 – 2019-01-02 (×2): 0.5 mg via ORAL
  Filled 2018-12-31 (×2): qty 1

## 2018-12-31 MED ORDER — ACETAMINOPHEN 325 MG PO TABS: 650.0000 mg | ORAL_TABLET | Freq: Four times a day (QID) | ORAL | Status: DC | PRN

## 2018-12-31 MED ORDER — ALBUTEROL SULFATE (2.5 MG/3ML) 0.083% IN NEBU: 2.5000 mg | INHALATION_SOLUTION | RESPIRATORY_TRACT | Status: DC | PRN

## 2018-12-31 MED ORDER — ONDANSETRON HCL 4 MG PO TABS: 4.0000 mg | ORAL_TABLET | Freq: Four times a day (QID) | ORAL | Status: DC | PRN

## 2018-12-31 MED ORDER — VALPROIC ACID 250 MG PO CAPS
250.0000 mg | ORAL_CAPSULE | Freq: Every day | ORAL | Status: DC
  Administered 2018-12-31 – 2019-01-02 (×3): 250 mg via ORAL
  Filled 2018-12-31 (×5): qty 1

## 2018-12-31 MED ORDER — OXYCODONE-ACETAMINOPHEN 10-325 MG PO TABS: 1.0000 | ORAL_TABLET | Freq: Four times a day (QID) | ORAL | Status: DC | PRN

## 2018-12-31 MED ORDER — VITAMIN B-12 100 MCG PO TABS
100.0000 ug | ORAL_TABLET | Freq: Every day | ORAL | Status: DC
  Administered 2018-12-31 – 2019-01-03 (×4): 100 ug via ORAL
  Filled 2018-12-31 (×4): qty 1

## 2018-12-31 MED ORDER — METHYLPREDNISOLONE SODIUM SUCC 125 MG IJ SOLR: 1000.0000 mg | Freq: Once | INTRAMUSCULAR | Status: DC

## 2018-12-31 MED ORDER — GADOBUTROL 1 MMOL/ML IV SOLN
6.0000 mL | Freq: Once | INTRAVENOUS | Status: AC | PRN
  Administered 2018-12-31: 6 mL via INTRAVENOUS

## 2018-12-31 MED ORDER — PANTOPRAZOLE SODIUM 40 MG IV SOLR
40.0000 mg | Freq: Two times a day (BID) | INTRAVENOUS | Status: DC
  Administered 2018-12-31 – 2019-01-03 (×6): 40 mg via INTRAVENOUS
  Filled 2018-12-31 (×6): qty 40

## 2018-12-31 MED ORDER — ONDANSETRON HCL 4 MG/2ML IJ SOLN
4.0000 mg | Freq: Four times a day (QID) | INTRAMUSCULAR | Status: DC | PRN
  Administered 2018-12-31: 4 mg via INTRAVENOUS
  Filled 2018-12-31: qty 2

## 2018-12-31 MED ORDER — PREGABALIN 50 MG PO CAPS
50.0000 mg | ORAL_CAPSULE | Freq: Four times a day (QID) | ORAL | Status: DC
  Administered 2018-12-31 – 2019-01-03 (×12): 50 mg via ORAL
  Filled 2018-12-31 (×12): qty 1

## 2018-12-31 MED ORDER — POLYETHYLENE GLYCOL 3350 17 G PO PACK
17.0000 g | PACK | Freq: Two times a day (BID) | ORAL | Status: DC
  Administered 2018-12-31 – 2019-01-02 (×3): 17 g via ORAL
  Filled 2018-12-31 (×5): qty 1

## 2018-12-31 MED ORDER — SODIUM CHLORIDE 0.9% FLUSH
3.0000 mL | Freq: Two times a day (BID) | INTRAVENOUS | Status: DC
  Administered 2018-12-31 – 2019-01-02 (×3): 3 mL via INTRAVENOUS

## 2018-12-31 MED ORDER — TRAZODONE HCL 50 MG PO TABS: 50.0000 mg | ORAL_TABLET | Freq: Every evening | ORAL | Status: DC | PRN

## 2018-12-31 MED ORDER — INSULIN ASPART 100 UNIT/ML ~~LOC~~ SOLN: 0.0000 [IU] | Freq: Three times a day (TID) | SUBCUTANEOUS | Status: DC

## 2018-12-31 MED ORDER — SODIUM CHLORIDE 0.9 % IV SOLN: 250.0000 mL | INTRAVENOUS | Status: DC | PRN

## 2018-12-31 MED ORDER — FENTANYL CITRATE (PF) 100 MCG/2ML IJ SOLN
100.0000 ug | Freq: Once | INTRAMUSCULAR | Status: AC
  Administered 2018-12-31: 100 ug via INTRAVENOUS
  Filled 2018-12-31: qty 2

## 2018-12-31 MED ORDER — BISACODYL 10 MG RE SUPP
10.0000 mg | Freq: Once | RECTAL | Status: AC
  Administered 2018-12-31: 10 mg via RECTAL
  Filled 2018-12-31: qty 1

## 2018-12-31 MED ORDER — PANTOPRAZOLE SODIUM 40 MG PO TBEC: 40.0000 mg | DELAYED_RELEASE_TABLET | Freq: Every day | ORAL | Status: DC

## 2018-12-31 MED ORDER — VITAMIN E 45 MG (100 UNIT) PO CAPS
1000.0000 [IU] | ORAL_CAPSULE | Freq: Every day | ORAL | Status: DC
  Administered 2019-01-01 – 2019-01-02 (×2): 1000 [IU] via ORAL
  Filled 2018-12-31 (×5): qty 2

## 2018-12-31 MED ORDER — SODIUM CHLORIDE 0.9 % IV BOLUS
500.0000 mL | Freq: Once | INTRAVENOUS | Status: AC
  Administered 2018-12-31: 500 mL via INTRAVENOUS

## 2018-12-31 MED ORDER — PROMETHAZINE HCL 25 MG/ML IJ SOLN
12.5000 mg | Freq: Once | INTRAMUSCULAR | Status: AC
  Administered 2018-12-31: 12.5 mg via INTRAVENOUS
  Filled 2018-12-31: qty 1

## 2018-12-31 NOTE — H&P (Signed)
Patient Demographics:    Susan Hodge, is a 69 y.o. 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  Possible GI bleeds, start IV Protonix 40 every 12 hours -GI consult requested -Last dose of Eliquis was 8pm on 12/30/2018  2)Pancreatic mass --- CT abdomen and pelvis showed interval enlargement of low-density lesion within the pancreatic uncinate process (currently 1.9 x 1.0 cm, previously 1.6 x 0.9 cm on 08/04/2013). Given the slight interval growth,  --surgical consultation requested  3) left lower extremity DVT/chronic anticoagulation--- hold Eliquis due to concerns about GI bleed -Last dose of Eliquis was 8pm on 12/30/2018 -She was diagnosed with left lower extremity DVT on 08/01/2018  4)Possible UTI--treat empirically with IV Rocephin pending cultures  5) Possible NMO Flare up--as per Tele-neurologist suspect flareup of neuromyelitis, telemetry neurologist advised IV Solu-Medrol 1 g daily for up to 5 days -neurologist also advised--MRI brain C-spine T-spine and L-spine -Discussed with Dr. Merlene Laughter the local neurologist who would be available to evaluate patient on 01/03/2019 if needed --Use Novolog/Humalog Sliding scale insulin with Accu-Cheks / Fingersticks  as ordered-while on high-dose steroids  -PTA patient was on IV Soliris every 2 weeks --- Frankly I have low clinical index of suspicion for NMO flareup -I will hold off on further IV steroids given possible upper GI bleed/Ulcers   With History of - Reviewed by me  Past Medical History:  Diagnosis Date   Chest pain    Collagen vascular disease ([place])    Depression    DJD (degenerative joint disease)    FH: CVA (cerebrovascular accident)    father   Fibromyalgia    GERD (gastroesophageal reflux disease)    Hypothyroidism    Lupus ([place])    Migraines    Palpitations    Status post cholecystectomy    appendectomy and bilaateral oophorectomy   Tobacco use       Past Surgical  History:  Procedure Laterality Date   BILATERAL OOPHORECTOMY     CARDIAC CATHETERIZATION     CHOLECYSTECTOMY      Chief Complaint  Patient presents with   Flank Pain     HPI:    Susan Hodge  is a 68 y.o. female with past medical history remarkable for in the move with chronic left lower extremity weakness presents with 1 week of  Worsening left lower extremity weakness, generalized weakness, nausea and vomiting and left flank discomfort/abdominal discomfort for the last 3 days -She feels like she has to have a bowel movement... She did have a BM that appeared darker than normal for her today  EDP was concerned about NMO flareup, telemetry neurologist advised treatment with IV Solu-Medrol 1 g daily for 5 days telemetry neurologist also advised--MRI brain C-spine T-spine and L-spine - - denies hematemesis, hematochezia  ?? Possible  Melena today  Admits to Ibuprofen use for Headaches  Last dose of Eliquis was 8pm on 12/30/2018   --- LFTs not elevated,, creatinine 0.9, lipase is 20 -UA suggestive of UTI with nitrite, leukocyte esterase, WBC and bacteria present -WBC is 4.9   H&H of 8.3 and 26.9,--patient baseline hemoglobin is usually around 12...  -?? Possible  Melena today in  the setting of ibuprofen and Eliquis use Stool occult blood pending    Review of systems:    In addition to the HPI above,   A full Review of  Systems was done, all other systems reviewed are negative except as noted above in HPI , .    Social History:  Reviewed by me    Social History   Tobacco Use   Smoking status: Current Every Day Smoker    Packs/day: 0.50    Types: Cigarettes   Smokeless tobacco: Never Used  Substance Use Topics   Alcohol use: No       Family History :  Reviewed by me    Family History  Problem Relation Age of Onset   Stroke Father      Home Medications:   Prior to Admission medications   Medication Sig Start Date End Date Taking? Authorizing Provider  acetaminophen (TYLENOL) 325 MG tablet Take 2 tablets (650 mg total) by mouth every 6 (six) hours as needed for mild pain (or Fever >/= 101). 08/18/18  Yes Angiulli, Lavon Paganini, PA-C  ALPRAZolam Duanne Moron) 0.5 MG tablet Take 1 tablet (0.5 mg total) by mouth 3 (three) times daily as needed for anxiety or sleep. 08/18/18  Yes Angiulli, Lavon Paganini, PA-C  amoxicillin (AMOXIL) 500 MG capsule Take 1 capsule by mouth 3 (three) times daily. 12/27/18  Yes [provider]  apixaban (ELIQUIS) 5 MG TABS tablet Take 1 tablet (5 mg total) by mouth 2 (two) times daily. 08/18/18  Yes Angiulli, Lavon Paganini, PA-C  Cyanocobalamin (VITAMIN B-12 PO) Take 2 tablets by mouth daily.   Yes [provider]  darifenacin (ENABLEX) 15 MG 24 hr tablet Take 1 tablet (15 mg total) by mouth daily. 09/23/18  Yes Sater, Nanine Means, MD  Eculizumab (SOLIRIS IV) Inject into the vein See admin instructions. 900mg  IV for 4 weeks, 1200mg  at week 5 and then 1200mg  q 2 weeks   Yes [provider]  ibuprofen (ADVIL) 400 MG tablet Take 400 mg by mouth every 8 (eight) hours as needed. 11/19/18  Yes [provider]  lidocaine (LIDODERM) 5 % Place 1 patch onto the skin every 12 (twelve) hours. 12/01/18  Yes [provider]  loratadine (CLARITIN) 10 MG tablet Take 10 mg by mouth daily.   Yes [provider]  oxyCODONE-acetaminophen (PERCOCET) 10-325 MG tablet Take 1 tablet by mouth every 6 (six) hours as needed. 12/13/18  Yes [provider]  pantoprazole (PROTONIX) 40 MG tablet Take 1 tablet (40 mg total) by mouth 2 (two) times daily. 08/18/18  Yes Angiulli, Lavon Paganini, PA-C  pregabalin (LYRICA) 50 MG capsule Take 1 capsule (50 mg total) by mouth 3 (three) times daily. Patient taking differently: Take 50 mg by mouth 4 (four) times daily.  08/18/18  Yes Angiulli, Lavon Paganini, PA-C  valproic acid (DEPAKENE) 250 MG capsule Take 1 capsule (250 mg total) by mouth at bedtime. 08/18/18  Yes Angiulli, Lavon Paganini, PA-C  Vitamin D, Ergocalciferol, (DRISDOL) 1.25 MG (50000 UT) CAPS capsule Take 1 capsule (50,000 Units total) by mouth every Monday. 08/23/18  Yes Angiulli, Lavon Paganini, PA-C  vitamin E 1000 UNIT capsule Take 1,000 Units by mouth daily.   Yes [provider]  baclofen (LIORESAL) 10 MG tablet Take 1 tablet (10 mg total) by mouth 3 (three) times daily. Patient not taking: Reported on 12/31/2018 09/23/18   Britt Bottom, MD  predniSONE (DELTASONE) 20 MG tablet Take 1 tablet (20 mg total) by mouth daily. Patient not taking: Reported on 12/31/2018 09/23/18   Britt Bottom, MD     Allergies:     Allergies  Allergen Reactions   Amitriptyline Other (See Comments)   Bee Venom Other (See Comments)   Fexofenadine Other (See Comments)   Furosemide Other (See Comments)   Gabapentin Other (See Comments)   Hydroxychloroquine Sulfate Other (See Comments)   Morphine Other (See Comments)   Nsaids    Oxycodone Other (See Comments)    dizzy   Topiramate Other (See Comments)     Physical Exam:   Vitals  Blood pressure 109/65, pulse 73, temperature 97.8 F (36.6 C), temperature source Oral, resp. rate 15, height 5\' 1"  (1.549 m), weight 56.2 kg, SpO2 99 %.  Physical  Examination: General appearance - alert, in no distress  Mental status - alert, oriented to person, place, and time,  Eyes - sclera anicteric Neck - supple, no JVD elevation , Chest - clear  to auscultation bilaterally, symmetrical air movement,  Heart - S1 and S2 normal, regular  Abdomen - soft, epigastric and left upper quadrant area tenderness without rebound or guarding, nondistended, no masses or organomegaly Neurological - screening mental status exam normal, neck supple without rigidity, cranial nerves II through XII intact,  -Chronic left lower extremity weakness Extremities -left lower extremity with 1+ pedal edema noted, intact peripheral pulses  Skin - pale    Data Review:    CBC Recent Labs  Lab 12/31/18 0915  WBC 4.9  HGB 8.3*  HCT 26.9*  PLT 155  MCV 87.3  MCH 26.9  MCHC 30.9  RDW 14.8  LYMPHSABS 1.0  MONOABS 0.3  EOSABS 0.1  BASOSABS 0.0   ------------------------------------------------------------------------------------------------------------------  Chemistries  Recent Labs  Lab 12/31/18 0915  NA 141  K 4.2  CL 110  CO2 25  GLUCOSE 92  BUN 14  CREATININE 0.96  CALCIUM 8.6*  AST 15  ALT 9  ALKPHOS 42  BILITOT 0.5   ------------------------------------------------------------------------------------------------------------------ estimated creatinine clearance is 42.9 mL/min (by C-G formula based on SCr of 0.96 mg/dL). ------------------------------------------------------------------------------------------------------------------ No results for input(s): TSH, T4TOTAL, T3FREE, THYROIDAB in the last 72 hours.  Invalid input(s): FREET3   Coagulation profile Recent Labs  Lab 12/31/18 0915  INR 1.3*   ------------------------------------------------------------------------------------------------------------------- No results for input(s): DDIMER in the last 72  hours. -------------------------------------------------------------------------------------------------------------------  Cardiac Enzymes No results for input(s): CKMB, TROPONINI, MYOGLOBIN in the last 168 hours.  Invalid input(s): CK ------------------------------------------------------------------------------------------------------------------ No results found for: BNP   ---------------------------------------------------------------------------------------------------------------  Urinalysis    Component Value Date/Time   COLORURINE YELLOW 12/31/2018 1050   APPEARANCEUR CLEAR 12/31/2018 1050   LABSPEC 1.024 12/31/2018 1050   PHURINE 5.0 12/31/2018 1050   GLUCOSEU NEGATIVE 12/31/2018 1050   HGBUR NEGATIVE 12/31/2018 1050   BILIRUBINUR NEGATIVE 12/31/2018 1050   KETONESUR 5 (A) 12/31/2018 1050   PROTEINUR NEGATIVE 12/31/2018 1050   NITRITE POSITIVE (A) 12/31/2018 1050   LEUKOCYTESUR TRACE (A) 12/31/2018 1050    ----------------------------------------------------------------------------------------------------------------   Imaging Results:    Ct Abdomen Pelvis W Contrast  Result Date: 12/31/2018 CLINICAL DATA:  Left flank pain EXAM: CT ABDOMEN AND PELVIS WITH CONTRAST TECHNIQUE: Multidetector CT imaging of the abdomen and pelvis was performed using the standard protocol following bolus administration of intravenous contrast. CONTRAST:  59mL OMNIPAQUE IOHEXOL 300 MG/ML  SOLN COMPARISON:  CT 08/04/2013, 11/30/2012 FINDINGS: Lower chest: Bilateral pulmonary nodules including 5 mm posterior right lower lobe pulmonary nodule (series 4, image 27) and 6 mm juxtapleural left lower lobe pulmonary nodule (series 4, image 36). No significant interval change in size compared to prior. Hepatobiliary: No focal liver abnormality is seen. Status post cholecystectomy. No biliary dilatation. Pancreas: Slight interval enlargement of low-density lesion within the uncinate process of the pancreas  (series 2, image 32) now measuring 1.9 x 1.0 cm, previously 1.6 x 0.9 cm. Internal density of this lesion measures 22 Hounsfield units, slightly greater than fluid density. The remainder of the pancreas is otherwise unremarkable without surrounding inflammatory changes. Spleen: Normal in size without focal abnormality. Adrenals/Urinary Tract: Adrenal glands are unremarkable. Kidneys are normal, without renal calculi, focal lesion, or hydronephrosis. Ureters nondilated. Bladder is unremarkable. Stomach/Bowel: Stomach is within normal limits. Appendix not clearly identified. Scattered colonic diverticulosis. No evidence of bowel wall thickening, distention, or inflammatory changes. Vascular/Lymphatic: Aortic atherosclerosis. No enlarged abdominal or pelvic lymph nodes. Reproductive: Retroverted uterus containing a 2.9 cm low-density lesion in the fundal segment, unchanged from 11/30/2012, likely fibroid. Other: No abdominal wall hernia or abnormality. No abdominopelvic ascites. Musculoskeletal: No acute or significant osseous findings. IMPRESSION: 1. No acute abdominopelvic findings. 2. Slight interval enlargement of low-density lesion within the pancreatic uncinate process (currently 1.9 x 1.0 cm, previously 1.6 x 0.9 cm on 08/04/2013). Given the slight interval growth, surgical consultation is recommended. 3. Colonic diverticulosis without evidence of diverticulitis. 4. Stable appearance of bilateral lower lobe pulmonary nodules dating back to 08/04/2013. No further follow-up imaging is required. Electronically Signed   By: Davina Poke M.D.   On: 12/31/2018 10:49    Radiological Exams on Admission: Ct Abdomen Pelvis W Contrast  Result Date: 12/31/2018 CLINICAL DATA:  Left flank pain EXAM: CT ABDOMEN AND PELVIS WITH CONTRAST TECHNIQUE: Multidetector CT imaging of the abdomen and pelvis was performed using the standard protocol following bolus administration of intravenous contrast. CONTRAST:  10mL  OMNIPAQUE IOHEXOL 300 MG/ML  SOLN COMPARISON:  CT 08/04/2013, 11/30/2012 FINDINGS: Lower chest: Bilateral pulmonary nodules including 5 mm posterior right lower lobe pulmonary nodule (series 4, image 27) and 6 mm juxtapleural left lower lobe pulmonary nodule (series 4, image 36). No significant interval change in size compared to prior. Hepatobiliary: No focal liver abnormality is seen. Status post cholecystectomy. No biliary dilatation. Pancreas: Slight  interval enlargement of low-density lesion within the uncinate process of the pancreas (series 2, image 32) now measuring 1.9 x 1.0 cm, previously 1.6 x 0.9 cm. Internal density of this lesion measures 22 Hounsfield units, slightly greater than fluid density. The remainder of the pancreas is otherwise unremarkable without surrounding inflammatory changes. Spleen: Normal in size without focal abnormality. Adrenals/Urinary Tract: Adrenal glands are unremarkable. Kidneys are normal, without renal calculi, focal lesion, or hydronephrosis. Ureters nondilated. Bladder is unremarkable. Stomach/Bowel: Stomach is within normal limits. Appendix not clearly identified. Scattered colonic diverticulosis. No evidence of bowel wall thickening, distention, or inflammatory changes. Vascular/Lymphatic: Aortic atherosclerosis. No enlarged abdominal or pelvic lymph nodes. Reproductive: Retroverted uterus containing a 2.9 cm low-density lesion in the fundal segment, unchanged from 11/30/2012, likely fibroid. Other: No abdominal wall hernia or abnormality. No abdominopelvic ascites. Musculoskeletal: No acute or significant osseous findings. IMPRESSION: 1. No acute abdominopelvic findings. 2. Slight interval enlargement of low-density lesion within the pancreatic uncinate process (currently 1.9 x 1.0 cm, previously 1.6 x 0.9 cm on 08/04/2013). Given the slight interval growth, surgical consultation is recommended. 3. Colonic diverticulosis without evidence of diverticulitis. 4. Stable  appearance of bilateral lower lobe pulmonary nodules dating back to 08/04/2013. No further follow-up imaging is required. Electronically Signed   By: Davina Poke M.D.   On: 12/31/2018 10:49   DVT Prophylaxis -SCD  (possible GI bleed) AM Labs Ordered, also please review Full Orders  Family Communication: Admission, patients condition and plan of care including tests being ordered have been discussed with the patient who indicate understanding and agree with the plan   Code Status - Full Code  Likely DC to  Home   Condition   stable  Roxan Hockey M.D on 12/31/2018 at 6:43 PM Go to [email or web address] -  for contact info  [facility] - Office  [phone]

## 2018-12-31 NOTE — Consult Note (Signed)
[facility] Tele[facility]urology Consult Services  Stat Consult  Date of Service:   12/31/2018 11:12:55  Impression:     .  rule out NMO flare     .  rule out GI/infectious/metabolic process  Comments/Sign-Out: 68yo W w/PMH of NMO on Soliris, Lupus, fibromyalgia, hypothyroidism, collagen vascular disease p/w acute on chronic low back pain, nausea on 12/31/18. Symptoms started last night. She also reports poor appetite. On exam, she admits to new left leg weakness since 2 weeks ago, and new left face numbness that she cannot time. She denies new vision or speech changes, incontinence. She admits to new constipation since yesterday. Neuro exam shows left leg weakness, left face sensory loss, otherwise unremarkable. Pt with concern for NMO flare, rule out other possbilities such as GI/infectious/metabolic etiologies as well.  Metrics: [facility] Notification Time: 12/31/2018 11:12:10 Stamp Time: 12/31/2018 11:12:55 Callback Response Time: 12/31/2018 11:16:27 Video Start Time: 12/31/2018 13:30:00 Video End Time: 12/31/2018 14:02:44  Our recommendations are outlined below.  Recommendations:     [facility]  MRI Brain and C/T/L spine w/wo contrast     .  Solumedrol 1g IV daily for 5 days for NMO flare     .  PT/OT evaluation     .  Continue Soliris as outpatient     .  Neurology follow-up advised   Therapies:     .  Physical Therapy     .  Occupational Therapy  Other WorkUp:     .  Infectious/metabolic workup per primary team  Disposition: Neurology Follow Up Recommended  Sign Out:     .  Discussed with Emergency Department Provider  ----------------------------------------------------------------------------------------------------  Chief Complaint: back pain, nausea  History of Present Illness: Patient is a 68 year old Female.  68yo W w/PMH of NMO on Soliris, Lupus, fibromyalgia, hypothyroidism, collagen vascular disease p/w acute on chronic low back pain,  nausea on 12/31/18. Symptoms started last night. She also reports poor appetite. On exam, she admits to new left leg weakness since 2 weeks ago, and new left face numbness that she cannot time. She denies new vision or speech changes, incontinence. She admits to new constipation since yesterday.   Past Medical History:  Anticoagulant use:  eliquis  Antiplatelet use: No Examination: BP(109/65), Blood Glucose(92) 1A: Level of Consciousness - Alert; keenly responsive + 0 1B: Ask Month and Age - Both Questions Right + 0 1C: Blink Eyes & Squeeze Hands - Performs Both Tasks + 0 2: Test Horizontal Extraocular Movements - Normal + 0 3: Test Visual Fields - No Visual Loss + 0 4: Test Facial Palsy (Use Grimace if Obtunded) - Normal symmetry + 0 5A: Test Left Arm Motor Drift - No Drift for 10 Seconds + 0 5B: Test Right Arm Motor Drift - No Drift for 10 Seconds + 0 6A: Test Left Leg Motor Drift - Some Effort Against Gravity + 2 6B: Test Right Leg Motor Drift - No Drift for 5 Seconds + 0 7: Test Limb Ataxia (FNF/Heel-Shin) - No Ataxia + 0 8: Test Sensation - Mild-Moderate Loss: Less Sharp/More Dull + 1 9: Test Language/Aphasia - Normal; No aphasia + 0 10: Test Dysarthria - Normal + 0 11: Test Extinction/Inattention - No abnormality + 0  NIHSS Score: 3  Patient/Family was informed the Neurology Consult would happen via TeleHealth consult by way of interactive audio and video telecommunications and consented to receiving care in this manner.  Due to the immediate potential for life-threatening deterioration due to underlying acute neurologic  illness, I spent 35 minutes providing critical care. This time includes time for face to face visit via telemedicine, review of medical records, imaging studies and discussion of findings with providers, the patient and/or family.   Dr Lenard Galloway Lark Runk   [facility] [phone]   Case [id number]

## 2018-12-31 NOTE — ED Triage Notes (Signed)
Pt c/o of left flank pain and weakness this has been going on for couple of days. She has been having nausea and vomiting also she appears to be jaundice which is new for her

## 2018-12-31 NOTE — ED Provider Notes (Signed)
Emergency Department Provider Note   I have reviewed the triage vital signs and the nursing notes.   HISTORY  Chief Complaint Flank Pain   HPI Susan Hodge is a 68 y.o. 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  She describes the pain as severe and sometimes radiating down the left leg.  She has chronic weakness and pain on the left side which is unchanged from her prior.  The abdominal pain, however, is new.  She states "I feel like you need to go to the bathroom" but reports that she is having normal bowel movements.  No diarrhea.  No blood in the stool.  No vomiting but is having frequent dry heaving.  She has been compliant with her medications.  She denies chest pain or shortness of breath. Pain is worse with touching the area and moving.   Past Medical History:  Diagnosis Date   Chest pain    Collagen vascular disease ([place])    Depression    DJD (degenerative joint disease)    FH: CVA (cerebrovascular accident)    father   Fibromyalgia    GERD (gastroesophageal reflux disease)    Hypothyroidism    Lupus (HCC)    Migraines    Palpitations    Status post cholecystectomy    appendectomy and bilaateral oophorectomy   Tobacco use     Patient Active Problem List   Diagnosis Date Noted   Neuromyelitis ([place]) 12/31/2018   Lt Hemiparesis ([place]) 12/31/2018   Pancreatic mass 12/31/2018   Anemia due to blood loss, acute 12/31/2018   Neuromyelitis optica (devic) ([place]) 09/09/2018   Left hemiplegia ([place]) 09/09/2018   Dysesthesia 09/09/2018   AKI (acute kidney injury) ([place])    Neurogenic bowel    Neurogenic bladder    Anxiety state    Acute blood loss anemia    Hypoalbuminemia due to protein-calorie malnutrition (HCC)    Elevated BUN    Postoperative pain    Chronic pain syndrome    Myelitis ([place])  07/31/2018   Leukocytosis 07/31/2018   Anxiety 07/31/2018   GERD (gastroesophageal reflux disease) 07/31/2018   ANXIETY DISORDER, GENERALIZED 07/23/2009   UNSPECIFIED HYPOTHYROIDISM 06/21/2009   NONDEPENDENT TOBACCO USE DISORDER 06/21/2009   DEPRESSIVE DISORDER NOT ELSEWHERE CLASSIFIED 06/21/2009   INTERMEDIATE CORONARY SYNDROME 06/21/2009   LUPUS NEPHRITIS 06/21/2009   UNSPECIFIED MYALGIA AND MYOSITIS 06/21/2009   PRECORDIAL PAIN 06/21/2009    Past Surgical History:  Procedure Laterality Date   BILATERAL OOPHORECTOMY     CARDIAC CATHETERIZATION     CHOLECYSTECTOMY      Allergies Amitriptyline, Bee venom, Fexofenadine, Furosemide, Gabapentin, Hydroxychloroquine sulfate, Morphine, Nsaids, Oxycodone, and Topiramate  Family History  Problem Relation Age of Onset   Stroke Father     Social History Social History   Tobacco Use   Smoking status: Current Every Day Smoker    Packs/day: 0.50    Types: Cigarettes   Smokeless tobacco: Never Used  Substance Use Topics   Alcohol use: No   Drug use: Never    Review of Systems  Constitutional: No fever/chills Eyes: No visual changes. ENT: No sore throat. Cardiovascular: Denies chest pain. Respiratory: Denies shortness of breath. Gastrointestinal: Positive left abdominal pain.  Positive nausea, no vomiting.  No diarrhea.  No constipation.  Genitourinary: Negative for dysuria. Musculoskeletal: Positive for back pain and left leg pain.  Skin: Negative for rash. Neurological: Negative for headaches, focal weakness or numbness.  10-point ROS otherwise negative.  ____________________________________________   PHYSICAL EXAM:  VITAL SIGNS: ED Triage Vitals  Enc Vitals Group     BP 12/31/18 0811 125/74     Pulse Rate 12/31/18 0811 78     Resp --      Temp 12/31/18 0818 97.8 F (36.6 C)     Temp Source 12/31/18 0818 Oral     SpO2 12/31/18 0811 100 %     Weight 12/31/18 0813 124 lb (56.2 kg)      Height 12/31/18 0813 5\' 1"  (1.549 m)   Constitutional: Alert and oriented. Well appearing and in no acute distress. Eyes: Conjunctivae are normal.  Head: Atraumatic. Nose: No congestion/rhinnorhea. Mouth/Throat: Mucous membranes are moist.  Neck: No stridor.   Cardiovascular: Normal rate, regular rhythm. Good peripheral circulation. Grossly normal heart sounds.   Respiratory: Normal respiratory effort.  No retractions. Lungs CTAB. Gastrointestinal: Soft with focal tenderness and voluntary guarding on the left upper and lower abdomen/flank. No distention.  Musculoskeletal: Tenderness to palpation of the midline thoracic and lumbar spine. Pain with movement of the left leg. Left leg swollen in comparison to the right. No erythema.  Neurologic:  Normal speech and language.  Skin:  Skin is warm, dry and intact. No rash noted.  ____________________________________________   LABS (all labs ordered are listed, but only abnormal results are displayed)  Labs Reviewed  COMPREHENSIVE METABOLIC PANEL - Abnormal; Notable for the following components:      Result Value   Calcium 8.6 (*)    Total Protein 6.1 (*)    Albumin 3.2 (*)    All other components within normal limits  CBC WITH DIFFERENTIAL/PLATELET - Abnormal; Notable for the following components:   RBC 3.08 (*)    Hemoglobin 8.3 (*)    HCT 26.9 (*)    All other components within normal limits  URINALYSIS, ROUTINE W REFLEX MICROSCOPIC - Abnormal; Notable for the following components:   Ketones, ur 5 (*)    Nitrite POSITIVE (*)    Leukocytes,Ua TRACE (*)    Bacteria, UA RARE (*)    All other components within normal limits  PROTIME-INR - Abnormal; Notable for the following components:   Prothrombin Time 15.9 (*)    INR 1.3 (*)    All other components within normal limits  OCCULT BLOOD X 1 CARD TO LAB, STOOL - Abnormal; Notable for the following components:   Fecal Occult Bld POSITIVE (*)    All other components within normal  limits  CBC - Abnormal; Notable for the following components:   WBC 3.0 (*)    RBC 2.96 (*)    Hemoglobin 8.0 (*)    HCT 25.2 (*)    All other components within normal limits  GLUCOSE, CAPILLARY - Abnormal; Notable for the following components:   Glucose-Capillary 105 (*)    All other components within normal limits  SARS CORONAVIRUS 2 (HOSPITAL ORDER, [facility])  URINE CULTURE  LIPASE, BLOOD  LACTIC ACID, PLASMA  LACTIC ACID, PLASMA  BASIC METABOLIC PANEL  GLUCOSE, CAPILLARY  TROPONIN I (HIGH SENSITIVITY)  TROPONIN I (HIGH SENSITIVITY)   ____________________________________________  RADIOLOGY  Mr Jeri Cos Wo Contrast  Result Date: 12/31/2018 CLINICAL DATA:  68 year old female with left flank pain, weakness, nausea, vomiting and jaundice. EXAM: MRI HEAD WITHOUT AND WITH CONTRAST TECHNIQUE: Multiplanar, multiecho pulse sequences of the brain and surrounding structures were obtained without  and with intravenous contrast. CONTRAST:  6 milliliters Gadavist in conjunction with contrast enhanced imaging of the total spine reported separately. COMPARISON:  Brain MRI 07/31/2018. Total spine MRI today reported separately. CT Abdomen and Pelvis earlier today. FINDINGS: Brain: Chronic encephalomalacia in the anterior right MCA territory centered at the frontal operculum hree identified. No restricted diffusion to suggest acute infarction. No midline shift, mass effect, evidence of mass lesion, ventriculomegaly, extra-axial collection or acute intracranial hemorrhage. Cervicomedullary junction and pituitary are within normal limits. Elsewhere mild scattered bilateral cerebral white matter T2 and FLAIR hyperintensity. Although subtle Wallerian degeneration is suspected in the brainstem as seen on series 7, image 8, stable. No chronic blood products identified. No abnormal enhancement identified.  No dural thickening. Vascular: Major intracranial vascular flow voids Are stable.  Major dural venous sinuses are enhancing and appear to be patent. Skull and upper cervical spine: Cervical spine is reported separately today. Visualized bone marrow signal is within normal limits. Sinuses/Orbits: Negative orbits.  Paranasal sinuses are clear today. Other: Mastoids remain well pneumatized. Visible internal auditory structures appear normal. Nodular appearance of the bilateral parotid glands is re-demonstrated and stable. The visible submandibular glands seem to be spared. IMPRESSION: 1.  No acute intracranial abnormality. 2. Stable MRI appearance of the brain since March with chronic anterior division right MCA infarct and subtle Wallerian degeneration. 3. Total Spine MRI today reported separately. 4. Chronic nodular appearance of the parotid gland suggesting chronic/recurrent salivary gland inflammation, such as Sjogren syndrome. Electronically Signed   By: Genevie Ann M.D.   On: 12/31/2018 19:02   Mr Cervical Spine W Or Wo Contrast  Result Date: 12/31/2018 CLINICAL DATA:  68 year old female with left flank pain, weakness, nausea, vomiting and jaundice. History of multifocal abnormal cervical and thoracic spinal cord lesions with enhancement in March of this year. The patient carries a diagnosis of collagen vascular disease and lupus. EXAM: MRI CERVICAL AND THORACIC SPINE WITHOUT AND WITH CONTRAST TECHNIQUE: Multiplanar and multiecho pulse sequences of the cervical and thoracic spine were obtained without and with intravenous contrast. CONTRAST:  6 milliliters Gadavist in conjunction with contrast enhanced imaging of the brain and lumbar spine reported separately. COMPARISON:  Cervical and thoracic MRI 07/31/2018. FINDINGS: MRI CERVICAL SPINE FINDINGS Alignment: Improved cervical lordosis from the prior. Vertebrae: No marrow edema or evidence of acute osseous abnormality. Visualized bone marrow signal is within normal limits. Cord: Progressive T2 and STIR hyperintensity in the cervical spinal cord  at C2-C3 since March. Previously abnormal upper cord signal was centered at C3. See series 4, image 8 today. The cord also appears mildly expanded there. Persistent patchy T2 and STIR hyperintensity at the C3 level which is in the left hemi cord as seen on series 5, image 6. However, the abnormal cord signal which was central and to the right at C7 has regressed or largely resolved. Little if any cord myelomalacia is evident there. Furthermore, although postcontrast detail is suboptimal compared to the prior study no abnormal cord enhancement is identified today. No dural thickening. Posterior Fossa, vertebral arteries, paraspinal tissues: Cervicomedullary junction is within normal limits. Brain findings today are reported separately. Preserved major vascular flow voids in the neck. Negative neck soft tissues. Disc levels: Cervical spine degeneration appears stable since March. There is multilevel mild degenerative cervical spinal stenosis from C3-C4 through C6-C7 related to disc bulging and ligament flavum hypertrophy. There is minimal to mild spinal cord mass effect. MRI THORACIC SPINE FINDINGS Segmentation:  Appears to be normal. Alignment:  Preserved thoracic kyphosis. Vertebrae: No marrow edema or evidence of acute osseous abnormality. Visualized bone marrow signal is within normal limits. Spinal cord: The widespread abnormal upper and midthoracic spinal cord T2 and FLAIR hyperintensity has regressed, and the cord now appears less expanded at those levels. No definite cord myelomalacia. However, there remains significant abnormal STIR signal throughout the thoracic cord which is now confluent through the midthoracic levels which previously were spared in March. See series 6, image 9). Cord volume here appears diminished. On axial images the cord signal seems to be in the left greater than right central gray matter as seen on series 7, image 21. Central cord involvement is noted in the lower thoracic spine seen  at T10 on series 7, image 28. The conus medullaris seems to be spared as before. No contrast was administered in the thoracic spine previously, but there is no abnormal intradural enhancement or dural thickening today. Paraspinal and other soft tissues: Negative visible thoracic and upper abdominal viscera, trace fluid in nondilated esophagus. Disc levels: Mild for age thoracic disc and endplate degeneration. No thoracic spinal stenosis. IMPRESSION: 1. Mixed appearance of the cervical and thoracic spinal cord lesions since March: - new/progressed cord lesion at the C2-C3 level. - regressed lower cervical cord signal abnormality. - progressed in near complete thoracic cord STIR signal abnormality now, although the cord appears less expanded and perhaps with a degree of myelomalacia in the mid thoracic cord. 2. The conus medullaris seems to remain spared. 3. Today there is no abnormal cervical or thoracic cord enhancement. 4. Given the loss of both enhancement and cord expansion metastatic or neoplastic etiologies seem excluded. This seems to be an inflammatory process with a lack of involvement in the brain. Top differential considerations include: Lupus, Sjogren syndrome (note parotid abnormality on brain MRIs), Behcet disease, Neurosarcoidosis. Less likely considerations include multiple sclerosis, other autoimmune etiologies, and spinal cord ischemia. 5. Superimposed cervical spine degeneration with multilevel mild spinal stenosis which appears stable since March. 6. Thoracic disc and endplate degeneration without spinal stenosis. Electronically Signed   By: Genevie Ann M.D.   On: 12/31/2018 19:24   Mr Thoracic Spine W Wo Contrast  Result Date: 12/31/2018 CLINICAL DATA:  68 year old female with left flank pain, weakness, nausea, vomiting and jaundice. History of multifocal abnormal cervical and thoracic spinal cord lesions with enhancement in March of this year. The patient carries a diagnosis of collagen vascular  disease and lupus. EXAM: MRI CERVICAL AND THORACIC SPINE WITHOUT AND WITH CONTRAST TECHNIQUE: Multiplanar and multiecho pulse sequences of the cervical and thoracic spine were obtained without and with intravenous contrast. CONTRAST:  6 milliliters Gadavist in conjunction with contrast enhanced imaging of the brain and lumbar spine reported separately. COMPARISON:  Cervical and thoracic MRI 07/31/2018. FINDINGS: MRI CERVICAL SPINE FINDINGS Alignment: Improved cervical lordosis from the prior. Vertebrae: No marrow edema or evidence of acute osseous abnormality. Visualized bone marrow signal is within normal limits. Cord: Progressive T2 and STIR hyperintensity in the cervical spinal cord at C2-C3 since March. Previously abnormal upper cord signal was centered at C3. See series 4, image 8 today. The cord also appears mildly expanded there. Persistent patchy T2 and STIR hyperintensity at the C3 level which is in the left hemi cord as seen on series 5, image 6. However, the abnormal cord signal which was central and to the right at C7 has regressed or largely resolved. Little if any cord myelomalacia is evident there. Furthermore, although postcontrast detail is suboptimal compared  to the prior study no abnormal cord enhancement is identified today. No dural thickening. Posterior Fossa, vertebral arteries, paraspinal tissues: Cervicomedullary junction is within normal limits. Brain findings today are reported separately. Preserved major vascular flow voids in the neck. Negative neck soft tissues. Disc levels: Cervical spine degeneration appears stable since March. There is multilevel mild degenerative cervical spinal stenosis from C3-C4 through C6-C7 related to disc bulging and ligament flavum hypertrophy. There is minimal to mild spinal cord mass effect. MRI THORACIC SPINE FINDINGS Segmentation:  Appears to be normal. Alignment:  Preserved thoracic kyphosis. Vertebrae: No marrow edema or evidence of acute osseous  abnormality. Visualized bone marrow signal is within normal limits. Spinal cord: The widespread abnormal upper and midthoracic spinal cord T2 and FLAIR hyperintensity has regressed, and the cord now appears less expanded at those levels. No definite cord myelomalacia. However, there remains significant abnormal STIR signal throughout the thoracic cord which is now confluent through the midthoracic levels which previously were spared in March. See series 6, image 9). Cord volume here appears diminished. On axial images the cord signal seems to be in the left greater than right central gray matter as seen on series 7, image 21. Central cord involvement is noted in the lower thoracic spine seen at T10 on series 7, image 28. The conus medullaris seems to be spared as before. No contrast was administered in the thoracic spine previously, but there is no abnormal intradural enhancement or dural thickening today. Paraspinal and other soft tissues: Negative visible thoracic and upper abdominal viscera, trace fluid in nondilated esophagus. Disc levels: Mild for age thoracic disc and endplate degeneration. No thoracic spinal stenosis. IMPRESSION: 1. Mixed appearance of the cervical and thoracic spinal cord lesions since March: - new/progressed cord lesion at the C2-C3 level. - regressed lower cervical cord signal abnormality. - progressed in near complete thoracic cord STIR signal abnormality now, although the cord appears less expanded and perhaps with a degree of myelomalacia in the mid thoracic cord. 2. The conus medullaris seems to remain spared. 3. Today there is no abnormal cervical or thoracic cord enhancement. 4. Given the loss of both enhancement and cord expansion metastatic or neoplastic etiologies seem excluded. This seems to be an inflammatory process with a lack of involvement in the brain. Top differential considerations include: Lupus, Sjogren syndrome (note parotid abnormality on brain MRIs), Behcet disease,  Neurosarcoidosis. Less likely considerations include multiple sclerosis, other autoimmune etiologies, and spinal cord ischemia. 5. Superimposed cervical spine degeneration with multilevel mild spinal stenosis which appears stable since March. 6. Thoracic disc and endplate degeneration without spinal stenosis. Electronically Signed   By: Genevie Ann M.D.   On: 12/31/2018 19:24   Mr Lumbar Spine W Wo Contrast  Result Date: 12/31/2018 CLINICAL DATA:  68 year old female with left flank pain, weakness, nausea, vomiting and jaundice. History of abnormal cervical and thoracic spinal cord diagnosed in March. EXAM: MRI LUMBAR SPINE WITHOUT AND WITH CONTRAST TECHNIQUE: Multiplanar and multiecho pulse sequences of the lumbar spine were obtained without and with intravenous contrast. CONTRAST:  6 milliliters Gadavist in conjunction with contrast enhanced imaging of the brain and spine reported separately. COMPARISON:  Thoracic spine MRI today reported separately. CT Abdomen and Pelvis earlier today. Lumbar MRI 07/26/2018. FINDINGS: Segmentation: Normal, the same numbering system used on the thoracic MRI today. Alignment:  Stable lumbar lordosis since March. Vertebrae: No marrow edema or evidence of acute osseous abnormality. Visualized bone marrow signal is within normal limits. There is a small chronic  inferior T11 endplate Schmorl's node. Intact visible sacrum and SI joints. Conus medullaris and cauda equina: Abnormal STIR and T2 hyperintensity in the thoracic spinal cord abates at the T10 T11 level. From T11 to the conus at L1 the lower cord appears to be normal. Unremarkable cauda quinine nerve roots aside from degenerative stenosis in the lower lumbar spine. No abnormal intradural enhancement or dural thickening. Paraspinal and other soft tissues: Negative. Disc levels: Mild lumbar degeneration without stenosis above L3-L4. L3-L4: Left eccentric disc bulge and moderate facet hypertrophy. Mild spinal stenosis and left  lateral recess stenosis. Mild left L3 foraminal stenosis. This level is stable. L4-L5: Circumferential disc bulge with broad-based posterior component and central annular fissure of the disc (series 6, image 8). Moderate posterior element hypertrophy. Increased spinal stenosis at this level since March, now moderate to severe on series 5, image 24. Mild to moderate bilateral lateral recess stenosis. Mild bilateral L4 foraminal stenosis is stable. L5-S1: Increased epidural lipomatosis at this level since March which effaces CSF from the thecal sac. Stable mild to moderate facet hypertrophy and mild foraminal disc bulging and endplate spurring. Mild mostly left side L5 foraminal stenosis is stable. IMPRESSION: 1. Normal appearance of the lower thoracic spinal cord from T11 through the conus at L1. Normal cauda equina aside from degenerative stenosis in the lower lumbar spine. See also cervical and thoracic spine MRI today reported separately. 2. L3-L4 through L5-S1 lumbar spine degeneration with superimposed increased epidural lipomatosis since March. - subsequent increased and now Moderate to Severe L4-L5 spinal stenosis. - stable mild spinal and left lateral recess stenosis at L3-L4. Electronically Signed   By: Genevie Ann M.D.   On: 12/31/2018 19:53   Ct Abdomen Pelvis W Contrast  Result Date: 12/31/2018 CLINICAL DATA:  Left flank pain EXAM: CT ABDOMEN AND PELVIS WITH CONTRAST TECHNIQUE: Multidetector CT imaging of the abdomen and pelvis was performed using the standard protocol following bolus administration of intravenous contrast. CONTRAST:  31mL OMNIPAQUE IOHEXOL 300 MG/ML  SOLN COMPARISON:  CT 08/04/2013, 11/30/2012 FINDINGS: Lower chest: Bilateral pulmonary nodules including 5 mm posterior right lower lobe pulmonary nodule (series 4, image 27) and 6 mm juxtapleural left lower lobe pulmonary nodule (series 4, image 36). No significant interval change in size compared to prior. Hepatobiliary: No focal liver  abnormality is seen. Status post cholecystectomy. No biliary dilatation. Pancreas: Slight interval enlargement of low-density lesion within the uncinate process of the pancreas (series 2, image 32) now measuring 1.9 x 1.0 cm, previously 1.6 x 0.9 cm. Internal density of this lesion measures 22 Hounsfield units, slightly greater than fluid density. The remainder of the pancreas is otherwise unremarkable without surrounding inflammatory changes. Spleen: Normal in size without focal abnormality. Adrenals/Urinary Tract: Adrenal glands are unremarkable. Kidneys are normal, without renal calculi, focal lesion, or hydronephrosis. Ureters nondilated. Bladder is unremarkable. Stomach/Bowel: Stomach is within normal limits. Appendix not clearly identified. Scattered colonic diverticulosis. No evidence of bowel wall thickening, distention, or inflammatory changes. Vascular/Lymphatic: Aortic atherosclerosis. No enlarged abdominal or pelvic lymph nodes. Reproductive: Retroverted uterus containing a 2.9 cm low-density lesion in the fundal segment, unchanged from 11/30/2012, likely fibroid. Other: No abdominal wall hernia or abnormality. No abdominopelvic ascites. Musculoskeletal: No acute or significant osseous findings. IMPRESSION: 1. No acute abdominopelvic findings. 2. Slight interval enlargement of low-density lesion within the pancreatic uncinate process (currently 1.9 x 1.0 cm, previously 1.6 x 0.9 cm on 08/04/2013). Given the slight interval growth, surgical consultation is recommended. 3. Colonic diverticulosis without evidence  of diverticulitis. 4. Stable appearance of bilateral lower lobe pulmonary nodules dating back to 08/04/2013. No further follow-up imaging is required. Electronically Signed   By: Davina Poke M.D.   On: 12/31/2018 10:49    ____________________________________________   PROCEDURES  Procedure(s) performed:   Procedures  CRITICAL CARE Performed by: Margette Fast Total critical care  time: 35 minutes Critical care time was exclusive of separately billable procedures and treating other patients. Critical care was necessary to treat or prevent imminent or life-threatening deterioration. Critical care was time spent personally by me on the following activities: development of treatment plan with patient and/or surrogate as well as nursing, discussions with consultants, evaluation of patient's response to treatment, examination of patient, obtaining history from patient or surrogate, ordering and performing treatments and interventions, ordering and review of laboratory studies, ordering and review of radiographic studies, pulse oximetry and re-evaluation of patient's condition.  Nanda Quinton, MD Emergency Medicine  ____________________________________________   INITIAL IMPRESSION / ASSESSMENT AND PLAN / ED COURSE  Pertinent labs & imaging results that were available during my care of the patient were reviewed by me and considered in my medical decision making (see chart for details).   Patient with past history of neuromyelitis currently receiving soliris infusion presents to the emergency department with left sided abdominal pain radiating down the left leg.  Left side symptoms seem mostly chronic.  She has normal sensation in the left lower extremity but strength exam is difficult due to pain with moving the leg.  She is focally tender in the left abdomen.  There is slight jaundice noticed on exam as well.  Patient has not been aware of this.  No chest pain or shortness of breath symptoms.  Plan for screening lab work including lactate with lower suspicion for ischemic cause for belly pain.  Will obtain CT abdomen pelvis.   02:05 PM  Tele-neurology evaluated the patient and agrees that this could represent a neuromyelitis flare.  They are recommending 1 g of Solu-Medrol to be given now and then daily for 5 days.  Recommend MRI.  Will discuss with neurology at [facility] as  patient will likely need admission there with in house neurology available to assist with mgmt.   Discussed patient's case with [facility] to request admission. Patient and family (if present) updated with plan. Care transferred to [facility] service.  I reviewed all nursing notes, vitals, pertinent old records, EKGs, labs, imaging (as available).  ____________________________________________  FINAL CLINICAL IMPRESSION(S) / ED DIAGNOSES  Final diagnoses:  Abdominal pain, unspecified abdominal location  Neuromyelitis ([place])     MEDICATIONS GIVEN DURING THIS VISIT:  Medications  cefTRIAXone (ROCEPHIN) 1 g in sodium chloride 0.9 % 100 mL IVPB (1 g Intravenous New Bag/Given 12/31/18 1626)  insulin aspart (novoLOG) injection 0-5 Units (0 Units Subcutaneous Not Given 01/01/19 0924)  insulin aspart (novoLOG) injection 0-5 Units (0 Units Subcutaneous Not Given 12/31/18 2114)  pantoprazole (PROTONIX) injection 40 mg (40 mg Intravenous Given 01/01/19 0909)  ALPRAZolam (XANAX) tablet 0.5 mg (0.5 mg Oral Given 12/31/18 2043)  darifenacin (ENABLEX) 24 hr tablet 15 mg (15 mg Oral Given 01/01/19 0913)  vitamin B-12 (CYANOCOBALAMIN) tablet 100 mcg (100 mcg Oral Given 01/01/19 0912)  pregabalin (LYRICA) capsule 50 mg (50 mg Oral Given 01/01/19 0912)  valproic acid (DEPAKENE) 250 MG capsule 250 mg (250 mg Oral Given 12/31/18 2155)  Vitamin D (Ergocalciferol) (DRISDOL) capsule 50,000 Units (has no administration in time range)  vitamin E capsule 1,000 Units (1,000  Units Oral Given 01/01/19 0914)  loratadine (CLARITIN) tablet 10 mg (10 mg Oral Given 01/01/19 0912)  sodium chloride flush (NS) 0.9 % injection 3 mL (3 mLs Intravenous Not Given 01/01/19 0913)  sodium chloride flush (NS) 0.9 % injection 3 mL (has no administration in time range)  0.9 %  sodium chloride infusion (has no administration in time range)  acetaminophen (TYLENOL) tablet 650 mg (has no administration in time range)    Or  acetaminophen (TYLENOL)  suppository 650 mg (has no administration in time range)  traZODone (DESYREL) tablet 50 mg (has no administration in time range)  ondansetron (ZOFRAN) tablet 4 mg ( Oral See Alternative 12/31/18 2044)    Or  ondansetron (ZOFRAN) injection 4 mg (4 mg Intravenous Given 12/31/18 2044)  albuterol (PROVENTIL) (2.5 MG/3ML) 0.083% nebulizer solution 2.5 mg (has no administration in time range)  0.9 %  sodium chloride infusion ( Intravenous New Bag/Given 12/31/18 2055)  polyethylene glycol (MIRALAX / GLYCOLAX) packet 17 g (17 g Oral Not Given 01/01/19 0912)  oxyCODONE-acetaminophen (PERCOCET/ROXICET) 5-325 MG per tablet 1-2 tablet (2 tablets Oral Given 12/31/18 2042)  lidocaine (LIDODERM) 5 % 1 patch (1 patch Transdermal Patch Applied 01/01/19 0911)  sodium chloride 0.9 % bolus 500 mL (0 mLs Intravenous Stopped 12/31/18 0950)  fentaNYL (SUBLIMAZE) injection 100 mcg (100 mcg Intravenous Given 12/31/18 0854)  ondansetron (ZOFRAN) injection 4 mg (4 mg Intravenous Given 12/31/18 0854)  HYDROmorphone (DILAUDID) injection 0.5 mg (0.5 mg Intravenous Given 12/31/18 0950)  iohexol (OMNIPAQUE) 300 MG/ML solution 75 mL (75 mLs Intravenous Contrast Given 12/31/18 1025)  promethazine (PHENERGAN) injection 12.5 mg (12.5 mg Intravenous Given 12/31/18 1252)  HYDROmorphone (DILAUDID) injection 0.5 mg (0.5 mg Intravenous Given 12/31/18 1252)  methylPREDNISolone sodium succinate (SOLU-MEDROL) 1,000 mg in sodium chloride 0.9 % 100 mL IVPB (0 mg Intravenous Stopped 12/31/18 1622)  gadobutrol (GADAVIST) 1 MMOL/ML injection 6 mL (6 mLs Intravenous Contrast Given 12/31/18 1511)  bisacodyl (DULCOLAX) suppository 10 mg (10 mg Rectal Given 12/31/18 2044)     Note:  This document was prepared using Dragon voice recognition software and may include unintentional dictation errors.  Nanda Quinton, MD Emergency Medicine    Neeva Trew, [facility], MD 01/01/19 [phone]

## 2019-01-01 DIAGNOSIS — K921 Melena: Secondary | ICD-10-CM

## 2019-01-01 LAB — CBC
HCT: 25.2 % — ABNORMAL LOW (ref 36.0–46.0)
Hemoglobin: 8 g/dL — ABNORMAL LOW (ref 12.0–15.0)
MCH: 27 pg (ref 26.0–34.0)
MCHC: 31.7 g/dL (ref 30.0–36.0)
MCV: 85.1 fL (ref 80.0–100.0)
Platelets: 169 10*3/uL (ref 150–400)
RBC: 2.96 MIL/uL — ABNORMAL LOW (ref 3.87–5.11)
RDW: 14.6 % (ref 11.5–15.5)
WBC: 3 10*3/uL — ABNORMAL LOW (ref 4.0–10.5)
nRBC: 0 % (ref 0.0–0.2)

## 2019-01-01 LAB — BASIC METABOLIC PANEL
Anion gap: 10 (ref 5–15)
BUN: 21 mg/dL (ref 8–23)
CO2: 22 mmol/L (ref 22–32)
Calcium: 8.9 mg/dL (ref 8.9–10.3)
Chloride: 106 mmol/L (ref 98–111)
Creatinine, Ser: 0.78 mg/dL (ref 0.44–1.00)
GFR calc Af Amer: >60 mL/min (ref >60)
GFR calc non Af Amer: >60 mL/min (ref >60)
Glucose, Bld: 99 mg/dL (ref 70–99)
Potassium: 3.8 mmol/L (ref 3.5–5.1)
Sodium: 138 mmol/L (ref 135–145)

## 2019-01-01 LAB — GLUCOSE, CAPILLARY
Glucose-Capillary: 96 mg/dL (ref 70–99)
Glucose-Capillary: 76 mg/dL (ref 70–99)
Glucose-Capillary: 63 mg/dL — ABNORMAL LOW (ref 70–99)
Glucose-Capillary: 90 mg/dL (ref 70–99)
Glucose-Capillary: 69 mg/dL — ABNORMAL LOW (ref 70–99)

## 2019-01-01 MED ORDER — ALPRAZOLAM 0.5 MG PO TABS
0.5000 mg | ORAL_TABLET | Freq: Every day | ORAL | Status: DC
  Administered 2019-01-01 – 2019-01-02 (×2): 0.5 mg via ORAL
  Filled 2019-01-01 (×2): qty 1

## 2019-01-01 MED ORDER — CALCIUM CARBONATE ANTACID 500 MG PO CHEW
400.0000 mg | CHEWABLE_TABLET | Freq: Once | ORAL | Status: AC
  Administered 2019-01-01: 400 mg via ORAL
  Filled 2019-01-01: qty 2

## 2019-01-01 MED ORDER — ALPRAZOLAM 0.25 MG PO TABS
0.2500 mg | ORAL_TABLET | Freq: Once | ORAL | Status: AC
  Administered 2019-01-01: 0.25 mg via ORAL
  Filled 2019-01-01: qty 1

## 2019-01-01 NOTE — Progress Notes (Signed)
Denies chest pain at this time.  Consent for EGD signed and on chart

## 2019-01-01 NOTE — Progress Notes (Signed)
Has been very anxious concerning EGD.  C/O chest pain earlier , Dr. Denton Brick aware and ordered Tums and has had no further complaints. Consent signed for EGD

## 2019-01-01 NOTE — Progress Notes (Signed)
Patient Demographics:    Susan Hodge, is a 68 y.o. female, DOB - 04-20-1951, [id number]  Admit date - 12/31/2018   Admitting Physician Corianna Avallone Denton Brick, MD  Outpatient Primary MD for the patient is [name], NP  LOS - 1   Chief Complaint  Patient presents with   Flank Pain        Subjective:    Susan Hodge today has no fevers, no emesis,  No chest pain, dizziness, dyspnea on exertion, fatigue and weakness  Assessment  & Plan :    Principal Problem:   Neuromyelitis optica (devic) ([place]) Active Problems:   Neuromyelitis ([place])   Anemia due to blood loss, acute   GERD (gastroesophageal reflux disease)   Lt Hemiparesis (Susan Hodge)   Pancreatic mass  Brief Summary:- 68 y.o. female with past medical history remarkable for NMO with chronic left lower extremity weakness presents with 1 week of  Worsening left lower extremity weakness, generalized weakness, abdominal pain, nausea vomiting and melena and found to have hemoglobin drop of almost 4 g from baseline with heme positive stools   A/p  1)Acute symptomatic anemia--- suspect due to acute blood loss anemia due to GI bleed in the setting of concomitant Eliquis and ibuprofen use patient baseline hemoglobin usually around 12 , Hgb is now 8.0 -Continue to hold Eliquis,   -Last dose of Eliquis was 8pm on 12/30/2018 -Continue IV Protonix -GI consult appreciated -Keep n.p.o. after midnight for EGD on 01/02/2019 -pt has dizziness, dyspnea on exertion, fatigue and weakness   2)Pancreatic mass/Cyst --- CT abdomen and pelvis showed interval enlargement of low-density lesion within the pancreatic uncinate process (currently 1.9 x 1.0 cm, previously 1.6 x 0.9 cm on 08/04/2013).  However upon further review -   She had endoscopic ultrasound back in August 2014 at [facility] suggesting benign cyst.   Current CT reveals cyst dimension to be 19 x 10 mm.  This is  actually measured to be slightly larger on MRI of October 2015.   -GI recommends no further work-up at this time  3) left lower extremity DVT/chronic anticoagulation---  patient was diagnosed with DVT on 08/01/2018,  -hold Eliquis due to acute GI bleed -Last dose of Eliquis was 8pm on 12/30/2018  4)Possible UTI--continue IV Rocephin pending cultures  5) Possible NMO Flare up--as per Tele-neurologist suspect flareup of neuromyelitis, telemetry neurologist advised IV Solu-Medrol 1 g daily for up to 5 days -neurologist also advised--MRI brain C-spine T-spine and L-spine -Discussed with Dr. Merlene Laughter the local neurologist who would be available to evaluate patient on 01/03/2019 if needed -- -PTA patient was on IV Soliris every 2 weeks -I will hold off on further IV steroids given possible upper GI bleed/Ulcers -MRI brain and MRI lumbar spine without new acute findings -MRI cervical and thoracic spine with mixed appearance / findings--may be related to lupus or NMO -Hold off on high-dose IV steroids given acute GI bleed   Disposition/Need for in-Hospital Stay- patient unable to be discharged at this time due to symptomatic anemia secondary to acute GI bleed with 4 g drop in hemoglobin-pt has dizziness, dyspnea on exertion, fatigue and weakness  Code Status : Full  Family Communication:   NA (patient is alert, awake and coherent)   Disposition Plan  :  TBD  Consults  :  Gi/Gensurgery  DVT Prophylaxis  :    - SCDs (Gi Bleed)  Lab Results  Component Value Date   PLT 169 01/01/2019    Inpatient Medications  Scheduled Meds:  ALPRAZolam  0.25 mg Oral Once   ALPRAZolam  0.5 mg Oral QHS   calcium carbonate  400 mg of elemental calcium Oral Once   darifenacin  15 mg Oral Daily   insulin aspart  0-5 Units Subcutaneous TID WC   insulin aspart  0-5 Units Subcutaneous QHS   lidocaine  1 patch Transdermal q morning - 10a   loratadine  10 mg Oral Daily   pantoprazole (PROTONIX) IV   40 mg Intravenous Q12H   polyethylene glycol  17 g Oral BID   pregabalin  50 mg Oral QID   sodium chloride flush  3 mL Intravenous Q12H   valproic acid  250 mg Oral QHS   vitamin B-12  100 mcg Oral Daily   [START ON 01/03/2019] Vitamin D (Ergocalciferol)  50,000 Units Oral Q Mon   vitamin E  1,000 Units Oral Daily   Continuous Infusions:  sodium chloride     sodium chloride 50 mL/hr at 12/31/18 2055   cefTRIAXone (ROCEPHIN)  IV 1 g (01/01/19 1456)   PRN Meds:.sodium chloride, acetaminophen **OR** acetaminophen, albuterol, ALPRAZolam, ondansetron **OR** ondansetron (ZOFRAN) IV, oxyCODONE-acetaminophen, sodium chloride flush, traZODone    Anti-infectives (From admission, onward)   Start     Dose/Rate Route Frequency Ordered Stop   12/31/18 1530  cefTRIAXone (ROCEPHIN) 1 g in sodium chloride 0.9 % 100 mL IVPB     1 g 200 mL/hr over 30 Minutes Intravenous Every 24 hours 12/31/18 1508          Objective:   Vitals:   12/31/18 0900 12/31/18 1858 12/31/18 2321 01/01/19 0534  BP: 109/65 125/67 109/69 (!) 105/56  Pulse: 73 68 71 75  Resp: 15 16 15 16   Temp:  98.1 F (36.7 C) 97.8 F (36.6 C) 98.1 F (36.7 C)  TempSrc:  Oral Oral Oral  SpO2: 99% 100% 100% 97%  Weight:      Height:        Wt Readings from Last 3 Encounters:  12/31/18 56.2 kg  09/09/18 56.2 kg  07/31/18 57.1 kg     Intake/Output Summary (Last 24 hours) at 01/01/2019 1605 Last data filed at 01/01/2019 0604 Gross per 24 hour  Intake 804.69 ml  Output --  Net 804.69 ml     Physical Exam  Gen:- Awake Alert,  In no apparent distress  HEENT:- [place].AT, No sclera icterus Neck-Supple Neck,No JVD,.  Lungs-  CTAB , fair symmetrical air movement CV- S1, S2 normal, regular  Abd-  +ve B.Sounds, Abd Soft, epigastric and left upper quadrant discomfort is improved Extremity/Skin:-  pedal pulses present  Psych-affect is appropriate, oriented x3 Neuro-generalized weakness, no new focal deficits, no  tremors MSK--chronic left lower extremity weakness and swelling   Data Review:   Micro Results Recent Results (from the past 240 hour(s))  SARS Coronavirus 2 [facility] order, Performed in [facility] hospital lab) Nasopharyngeal Nasopharyngeal Swab     Status: None   Collection Time: 12/31/18  3:40 PM   Specimen: Nasopharyngeal Swab  Result Value Ref Range Status   SARS Coronavirus 2 NEGATIVE NEGATIVE Final    Comment: (NOTE) If result is NEGATIVE SARS-CoV-2 target nucleic acids are NOT DETECTED. The SARS-CoV-2 RNA is generally detectable in upper and lower  respiratory specimens during  the acute phase of infection. The lowest  concentration of SARS-CoV-2 viral copies this assay can detect is 250  copies / mL. A negative result does not preclude SARS-CoV-2 infection  and should not be used as the sole basis for treatment or other  patient management decisions.  A negative result may occur with  improper specimen collection / handling, submission of specimen other  than nasopharyngeal swab, presence of viral mutation(s) within the  areas targeted by this assay, and inadequate number of viral copies  (<250 copies / mL). A negative result must be combined with clinical  observations, patient history, and epidemiological information. If result is POSITIVE SARS-CoV-2 target nucleic acids are DETECTED. The SARS-CoV-2 RNA is generally detectable in upper and lower  respiratory specimens dur ing the acute phase of infection.  Positive  results are indicative of active infection with SARS-CoV-2.  Clinical  correlation with patient history and other diagnostic information is  necessary to determine patient infection status.  Positive results do  not rule out bacterial infection or co-infection with other viruses. If result is PRESUMPTIVE POSTIVE SARS-CoV-2 nucleic acids MAY BE PRESENT.   A presumptive positive result was obtained on the submitted specimen  and confirmed on repeat testing.   While 2019 novel coronavirus  (SARS-CoV-2) nucleic acids may be present in the submitted sample  additional confirmatory testing may be necessary for epidemiological  and / or clinical management purposes  to differentiate between  SARS-CoV-2 and other Sarbecovirus currently known to infect humans.  If clinically indicated additional testing with an alternate test  methodology [phone]) is advised. The SARS-CoV-2 RNA is generally  detectable in upper and lower respiratory sp ecimens during the acute  phase of infection. The expected result is Negative. Fact Sheet for Patients:  StrictlyIdeas.no Fact Sheet for Healthcare Providers: BankingDealers.co.za This test is not yet approved or cleared by the [place] FDA and has been authorized for detection and/or diagnosis of SARS-CoV-2 by FDA under an Emergency Use Authorization (EUA).  This EUA will remain in effect (meaning this test can be used) for the duration of the COVID-19 declaration under Section 564(b)(1) of the Act, 21 U.S.C. section 360bbb-3(b)(1), unless the authorization is terminated or revoked sooner. Performed at [facility], [address], [place], [place] [address]     Radiology Reports Mr Jeri Cos Wo Contrast  Result Date: 12/31/2018 CLINICAL DATA:  68 year old female with left flank pain, weakness, nausea, vomiting and jaundice. EXAM: MRI HEAD WITHOUT AND WITH CONTRAST TECHNIQUE: Multiplanar, multiecho pulse sequences of the brain and surrounding structures were obtained without and with intravenous contrast. CONTRAST:  6 milliliters Gadavist in conjunction with contrast enhanced imaging of the total spine reported separately. COMPARISON:  Brain MRI 07/31/2018. Total spine MRI today reported separately. CT Abdomen and Pelvis earlier today. FINDINGS: Brain: Chronic encephalomalacia in the anterior right MCA territory centered at the frontal operculum hree identified. No  restricted diffusion to suggest acute infarction. No midline shift, mass effect, evidence of mass lesion, ventriculomegaly, extra-axial collection or acute intracranial hemorrhage. Cervicomedullary junction and pituitary are within normal limits. Elsewhere mild scattered bilateral cerebral white matter T2 and FLAIR hyperintensity. Although subtle Wallerian degeneration is suspected in the brainstem as seen on series 7, image 8, stable. No chronic blood products identified. No abnormal enhancement identified.  No dural thickening. Vascular: Major intracranial vascular flow voids Are stable. Major dural venous sinuses are enhancing and appear to be patent. Skull and upper cervical spine: Cervical spine is reported separately today.  Visualized bone marrow signal is within normal limits. Sinuses/Orbits: Negative orbits.  Paranasal sinuses are clear today. Other: Mastoids remain well pneumatized. Visible internal auditory structures appear normal. Nodular appearance of the bilateral parotid glands is re-demonstrated and stable. The visible submandibular glands seem to be spared. IMPRESSION: 1.  No acute intracranial abnormality. 2. Stable MRI appearance of the brain since March with chronic anterior division right MCA infarct and subtle Wallerian degeneration. 3. Total Spine MRI today reported separately. 4. Chronic nodular appearance of the parotid gland suggesting chronic/recurrent salivary gland inflammation, such as Sjogren syndrome. Electronically Signed   By: Genevie Ann M.D.   On: 12/31/2018 19:02   Mr Cervical Spine W Or Wo Contrast  Result Date: 12/31/2018 CLINICAL DATA:  68 year old female with left flank pain, weakness, nausea, vomiting and jaundice. History of multifocal abnormal cervical and thoracic spinal cord lesions with enhancement in March of this year. The patient carries a diagnosis of collagen vascular disease and lupus. EXAM: MRI CERVICAL AND THORACIC SPINE WITHOUT AND WITH CONTRAST TECHNIQUE:  Multiplanar and multiecho pulse sequences of the cervical and thoracic spine were obtained without and with intravenous contrast. CONTRAST:  6 milliliters Gadavist in conjunction with contrast enhanced imaging of the brain and lumbar spine reported separately. COMPARISON:  Cervical and thoracic MRI 07/31/2018. FINDINGS: MRI CERVICAL SPINE FINDINGS Alignment: Improved cervical lordosis from the prior. Vertebrae: No marrow edema or evidence of acute osseous abnormality. Visualized bone marrow signal is within normal limits. Cord: Progressive T2 and STIR hyperintensity in the cervical spinal cord at C2-C3 since March. Previously abnormal upper cord signal was centered at C3. See series 4, image 8 today. The cord also appears mildly expanded there. Persistent patchy T2 and STIR hyperintensity at the C3 level which is in the left hemi cord as seen on series 5, image 6. However, the abnormal cord signal which was central and to the right at C7 has regressed or largely resolved. Little if any cord myelomalacia is evident there. Furthermore, although postcontrast detail is suboptimal compared to the prior study no abnormal cord enhancement is identified today. No dural thickening. Posterior Fossa, vertebral arteries, paraspinal tissues: Cervicomedullary junction is within normal limits. Brain findings today are reported separately. Preserved major vascular flow voids in the neck. Negative neck soft tissues. Disc levels: Cervical spine degeneration appears stable since March. There is multilevel mild degenerative cervical spinal stenosis from C3-C4 through C6-C7 related to disc bulging and ligament flavum hypertrophy. There is minimal to mild spinal cord mass effect. MRI THORACIC SPINE FINDINGS Segmentation:  Appears to be normal. Alignment:  Preserved thoracic kyphosis. Vertebrae: No marrow edema or evidence of acute osseous abnormality. Visualized bone marrow signal is within normal limits. Spinal cord: The widespread  abnormal upper and midthoracic spinal cord T2 and FLAIR hyperintensity has regressed, and the cord now appears less expanded at those levels. No definite cord myelomalacia. However, there remains significant abnormal STIR signal throughout the thoracic cord which is now confluent through the midthoracic levels which previously were spared in March. See series 6, image 9). Cord volume here appears diminished. On axial images the cord signal seems to be in the left greater than right central gray matter as seen on series 7, image 21. Central cord involvement is noted in the lower thoracic spine seen at T10 on series 7, image 28. The conus medullaris seems to be spared as before. No contrast was administered in the thoracic spine previously, but there is no abnormal intradural enhancement or dural thickening  today. Paraspinal and other soft tissues: Negative visible thoracic and upper abdominal viscera, trace fluid in nondilated esophagus. Disc levels: Mild for age thoracic disc and endplate degeneration. No thoracic spinal stenosis. IMPRESSION: 1. Mixed appearance of the cervical and thoracic spinal cord lesions since March: - new/progressed cord lesion at the C2-C3 level. - regressed lower cervical cord signal abnormality. - progressed in near complete thoracic cord STIR signal abnormality now, although the cord appears less expanded and perhaps with a degree of myelomalacia in the mid thoracic cord. 2. The conus medullaris seems to remain spared. 3. Today there is no abnormal cervical or thoracic cord enhancement. 4. Given the loss of both enhancement and cord expansion metastatic or neoplastic etiologies seem excluded. This seems to be an inflammatory process with a lack of involvement in the brain. Top differential considerations include: Lupus, Sjogren syndrome (note parotid abnormality on brain MRIs), Behcet disease, Neurosarcoidosis. Less likely considerations include multiple sclerosis, other autoimmune  etiologies, and spinal cord ischemia. 5. Superimposed cervical spine degeneration with multilevel mild spinal stenosis which appears stable since March. 6. Thoracic disc and endplate degeneration without spinal stenosis. Electronically Signed   By: Genevie Ann M.D.   On: 12/31/2018 19:24   Mr Thoracic Spine W Wo Contrast  Result Date: 12/31/2018 CLINICAL DATA:  68 year old female with left flank pain, weakness, nausea, vomiting and jaundice. History of multifocal abnormal cervical and thoracic spinal cord lesions with enhancement in March of this year. The patient carries a diagnosis of collagen vascular disease and lupus. EXAM: MRI CERVICAL AND THORACIC SPINE WITHOUT AND WITH CONTRAST TECHNIQUE: Multiplanar and multiecho pulse sequences of the cervical and thoracic spine were obtained without and with intravenous contrast. CONTRAST:  6 milliliters Gadavist in conjunction with contrast enhanced imaging of the brain and lumbar spine reported separately. COMPARISON:  Cervical and thoracic MRI 07/31/2018. FINDINGS: MRI CERVICAL SPINE FINDINGS Alignment: Improved cervical lordosis from the prior. Vertebrae: No marrow edema or evidence of acute osseous abnormality. Visualized bone marrow signal is within normal limits. Cord: Progressive T2 and STIR hyperintensity in the cervical spinal cord at C2-C3 since March. Previously abnormal upper cord signal was centered at C3. See series 4, image 8 today. The cord also appears mildly expanded there. Persistent patchy T2 and STIR hyperintensity at the C3 level which is in the left hemi cord as seen on series 5, image 6. However, the abnormal cord signal which was central and to the right at C7 has regressed or largely resolved. Little if any cord myelomalacia is evident there. Furthermore, although postcontrast detail is suboptimal compared to the prior study no abnormal cord enhancement is identified today. No dural thickening. Posterior Fossa, vertebral arteries, paraspinal  tissues: Cervicomedullary junction is within normal limits. Brain findings today are reported separately. Preserved major vascular flow voids in the neck. Negative neck soft tissues. Disc levels: Cervical spine degeneration appears stable since March. There is multilevel mild degenerative cervical spinal stenosis from C3-C4 through C6-C7 related to disc bulging and ligament flavum hypertrophy. There is minimal to mild spinal cord mass effect. MRI THORACIC SPINE FINDINGS Segmentation:  Appears to be normal. Alignment:  Preserved thoracic kyphosis. Vertebrae: No marrow edema or evidence of acute osseous abnormality. Visualized bone marrow signal is within normal limits. Spinal cord: The widespread abnormal upper and midthoracic spinal cord T2 and FLAIR hyperintensity has regressed, and the cord now appears less expanded at those levels. No definite cord myelomalacia. However, there remains significant abnormal STIR signal throughout the thoracic cord which  is now confluent through the midthoracic levels which previously were spared in March. See series 6, image 9). Cord volume here appears diminished. On axial images the cord signal seems to be in the left greater than right central gray matter as seen on series 7, image 21. Central cord involvement is noted in the lower thoracic spine seen at T10 on series 7, image 28. The conus medullaris seems to be spared as before. No contrast was administered in the thoracic spine previously, but there is no abnormal intradural enhancement or dural thickening today. Paraspinal and other soft tissues: Negative visible thoracic and upper abdominal viscera, trace fluid in nondilated esophagus. Disc levels: Mild for age thoracic disc and endplate degeneration. No thoracic spinal stenosis. IMPRESSION: 1. Mixed appearance of the cervical and thoracic spinal cord lesions since March: - new/progressed cord lesion at the C2-C3 level. - regressed lower cervical cord signal abnormality. -  progressed in near complete thoracic cord STIR signal abnormality now, although the cord appears less expanded and perhaps with a degree of myelomalacia in the mid thoracic cord. 2. The conus medullaris seems to remain spared. 3. Today there is no abnormal cervical or thoracic cord enhancement. 4. Given the loss of both enhancement and cord expansion metastatic or neoplastic etiologies seem excluded. This seems to be an inflammatory process with a lack of involvement in the brain. Top differential considerations include: Lupus, Sjogren syndrome (note parotid abnormality on brain MRIs), Behcet disease, Neurosarcoidosis. Less likely considerations include multiple sclerosis, other autoimmune etiologies, and spinal cord ischemia. 5. Superimposed cervical spine degeneration with multilevel mild spinal stenosis which appears stable since March. 6. Thoracic disc and endplate degeneration without spinal stenosis. Electronically Signed   By: Genevie Ann M.D.   On: 12/31/2018 19:24   Mr Lumbar Spine W Wo Contrast  Result Date: 12/31/2018 CLINICAL DATA:  68 year old female with left flank pain, weakness, nausea, vomiting and jaundice. History of abnormal cervical and thoracic spinal cord diagnosed in March. EXAM: MRI LUMBAR SPINE WITHOUT AND WITH CONTRAST TECHNIQUE: Multiplanar and multiecho pulse sequences of the lumbar spine were obtained without and with intravenous contrast. CONTRAST:  6 milliliters Gadavist in conjunction with contrast enhanced imaging of the brain and spine reported separately. COMPARISON:  Thoracic spine MRI today reported separately. CT Abdomen and Pelvis earlier today. Lumbar MRI 07/26/2018. FINDINGS: Segmentation: Normal, the same numbering system used on the thoracic MRI today. Alignment:  Stable lumbar lordosis since March. Vertebrae: No marrow edema or evidence of acute osseous abnormality. Visualized bone marrow signal is within normal limits. There is a small chronic inferior T11 endplate  Schmorl's node. Intact visible sacrum and SI joints. Conus medullaris and cauda equina: Abnormal STIR and T2 hyperintensity in the thoracic spinal cord abates at the T10 T11 level. From T11 to the conus at L1 the lower cord appears to be normal. Unremarkable cauda quinine nerve roots aside from degenerative stenosis in the lower lumbar spine. No abnormal intradural enhancement or dural thickening. Paraspinal and other soft tissues: Negative. Disc levels: Mild lumbar degeneration without stenosis above L3-L4. L3-L4: Left eccentric disc bulge and moderate facet hypertrophy. Mild spinal stenosis and left lateral recess stenosis. Mild left L3 foraminal stenosis. This level is stable. L4-L5: Circumferential disc bulge with broad-based posterior component and central annular fissure of the disc (series 6, image 8). Moderate posterior element hypertrophy. Increased spinal stenosis at this level since March, now moderate to severe on series 5, image 24. Mild to moderate bilateral lateral recess stenosis. Mild bilateral  L4 foraminal stenosis is stable. L5-S1: Increased epidural lipomatosis at this level since March which effaces CSF from the thecal sac. Stable mild to moderate facet hypertrophy and mild foraminal disc bulging and endplate spurring. Mild mostly left side L5 foraminal stenosis is stable. IMPRESSION: 1. Normal appearance of the lower thoracic spinal cord from T11 through the conus at L1. Normal cauda equina aside from degenerative stenosis in the lower lumbar spine. See also cervical and thoracic spine MRI today reported separately. 2. L3-L4 through L5-S1 lumbar spine degeneration with superimposed increased epidural lipomatosis since March. - subsequent increased and now Moderate to Severe L4-L5 spinal stenosis. - stable mild spinal and left lateral recess stenosis at L3-L4. Electronically Signed   By: Genevie Ann M.D.   On: 12/31/2018 19:53   Ct Abdomen Pelvis W Contrast  Result Date: 12/31/2018 CLINICAL  DATA:  Left flank pain EXAM: CT ABDOMEN AND PELVIS WITH CONTRAST TECHNIQUE: Multidetector CT imaging of the abdomen and pelvis was performed using the standard protocol following bolus administration of intravenous contrast. CONTRAST:  7mL OMNIPAQUE IOHEXOL 300 MG/ML  SOLN COMPARISON:  CT 08/04/2013, 11/30/2012 FINDINGS: Lower chest: Bilateral pulmonary nodules including 5 mm posterior right lower lobe pulmonary nodule (series 4, image 27) and 6 mm juxtapleural left lower lobe pulmonary nodule (series 4, image 36). No significant interval change in size compared to prior. Hepatobiliary: No focal liver abnormality is seen. Status post cholecystectomy. No biliary dilatation. Pancreas: Slight interval enlargement of low-density lesion within the uncinate process of the pancreas (series 2, image 32) now measuring 1.9 x 1.0 cm, previously 1.6 x 0.9 cm. Internal density of this lesion measures 22 Hounsfield units, slightly greater than fluid density. The remainder of the pancreas is otherwise unremarkable without surrounding inflammatory changes. Spleen: Normal in size without focal abnormality. Adrenals/Urinary Tract: Adrenal glands are unremarkable. Kidneys are normal, without renal calculi, focal lesion, or hydronephrosis. Ureters nondilated. Bladder is unremarkable. Stomach/Bowel: Stomach is within normal limits. Appendix not clearly identified. Scattered colonic diverticulosis. No evidence of bowel wall thickening, distention, or inflammatory changes. Vascular/Lymphatic: Aortic atherosclerosis. No enlarged abdominal or pelvic lymph nodes. Reproductive: Retroverted uterus containing a 2.9 cm low-density lesion in the fundal segment, unchanged from 11/30/2012, likely fibroid. Other: No abdominal wall hernia or abnormality. No abdominopelvic ascites. Musculoskeletal: No acute or significant osseous findings. IMPRESSION: 1. No acute abdominopelvic findings. 2. Slight interval enlargement of low-density lesion within the  pancreatic uncinate process (currently 1.9 x 1.0 cm, previously 1.6 x 0.9 cm on 08/04/2013). Given the slight interval growth, surgical consultation is recommended. 3. Colonic diverticulosis without evidence of diverticulitis. 4. Stable appearance of bilateral lower lobe pulmonary nodules dating back to 08/04/2013. No further follow-up imaging is required. Electronically Signed   By: Davina Poke M.D.   On: 12/31/2018 10:49     CBC Recent Labs  Lab 12/31/18 0915 01/01/19 0704  WBC 4.9 3.0*  HGB 8.3* 8.0*  HCT 26.9* 25.2*  PLT 155 169  MCV 87.3 85.1  MCH 26.9 27.0  MCHC 30.9 31.7  RDW 14.8 14.6  LYMPHSABS 1.0  --   MONOABS 0.3  --   EOSABS 0.1  --   BASOSABS 0.0  --     Chemistries  Recent Labs  Lab 12/31/18 0915 01/01/19 0704  NA 141 138  K 4.2 3.8  CL 110 106  CO2 25 22  GLUCOSE 92 99  BUN 14 21  CREATININE 0.96 0.78  CALCIUM 8.6* 8.9  AST 15  --   ALT  9  --   ALKPHOS 42  --   BILITOT 0.5  --    ------------------------------------------------------------------------------------------------------------------ No results for input(s): CHOL, HDL, LDLCALC, TRIG, CHOLHDL, LDLDIRECT in the last 72 hours.  No results found for: HGBA1C ------------------------------------------------------------------------------------------------------------------ No results for input(s): TSH, T4TOTAL, T3FREE, THYROIDAB in the last 72 hours.  Invalid input(s): FREET3 ------------------------------------------------------------------------------------------------------------------ No results for input(s): VITAMINB12, FOLATE, FERRITIN, TIBC, IRON, RETICCTPCT in the last 72 hours.  Coagulation profile Recent Labs  Lab 12/31/18 0915  INR 1.3*    No results for input(s): DDIMER in the last 72 hours.  Cardiac Enzymes No results for input(s): CKMB, TROPONINI, MYOGLOBIN in the last 168 hours.  Invalid input(s):  CK ------------------------------------------------------------------------------------------------------------------ No results found for: BNP   Roxan Hockey M.D on 01/01/2019 at 4:05 PM  Go to [email or web address] - for contact info  [facility] - Office  [phone]

## 2019-01-01 NOTE — Consult Note (Signed)
Referring Provider: Roxan Hockey, MD Primary Care Physician:  [name], NP Primary Gastroenterologist:  Dr. Laural Golden  Reason for Consultation:    Melena and anemia.  HPI:   Patient is 68 year old Caucasian female with multiple medical problems which include rheumatoid arthritis lupus GERD hypothyroidism migraine fibromyalgia who was diagnosed with neuro myelitis optica about 5 months ago and has been on monoclonal antibody as well as history of leg DVT who has been on apixaban for about 5 months who presented to emergency room with 3-day history of left-sided abdominal pain and noted to have tarry heme positive stool.  Her H&H was 8.3 and 26.9 with WBC of 4.9 and platelet count of 155K.  She underwent abdominal pelvic CT which revealed changes of colonic diverticulosis and pancreatic cyst measuring 19 x 10 mm. She also underwent telemetry neurology consultation as there was concern for worsening neuromyelitis optica.  She had MRI of cervical, thoracic, lumbar spine as well as MRI revealing multiple findings.  Patient was felt to have flareup of her neuromyelitis and begun on Solu-Medrol IV. Patient was admitted to hospitalist service and begun on IV pantoprazole.  She is also on ceftriaxone for urinary tract infection. Patient denies history of peptic ulcer disease.  She states heartburn is well controlled with pantoprazole.  She has multiple pains including fibromyalgia and migraine and has been using ibuprofen no more than 400 mg daily as needed.  Patient states she recently provided stool sample for guaiac test but has not heard the results.  She has never undergone screening colonoscopy. She has a history of pancreatic lesion which was discovered in 2014 and she had endoscopic ultrasound by Dr. Jerene Pitch of [facility].  She had follow-up MRI of pancreas in October 2015 and this cyst measured 14 x 19 x 25 mm.   Past Medical History:  Diagnosis Date  .  Neuromyelitis optica.   . Collagen vascular  disease ([place])   . Depression   . DJD (degenerative joint disease)   . FH: CVA (cerebrovascular accident)    father  . Fibromyalgia   . GERD (gastroesophageal reflux disease)   . Hypothyroidism   . Lupus ([place])   . Migraines   . Palpitations   . Status post cholecystectomy    appendectomy and bilaateral oophorectomy with appendectomy  . Tobacco use       Prior to Admission medications   Medication Sig Start Date End Date Taking? Authorizing Provider  acetaminophen (TYLENOL) 325 MG tablet Take 2 tablets (650 mg total) by mouth every 6 (six) hours as needed for mild pain (or Fever >/= 101). 08/18/18  Yes Angiulli, Lavon Paganini, PA-C  ALPRAZolam Duanne Moron) 0.5 MG tablet Take 1 tablet (0.5 mg total) by mouth 3 (three) times daily as needed for anxiety or sleep. 08/18/18  Yes Angiulli, Lavon Paganini, PA-C  amoxicillin (AMOXIL) 500 MG capsule Take 1 capsule by mouth 3 (three) times daily. 12/27/18  Yes [provider]  apixaban (ELIQUIS) 5 MG TABS tablet Take 1 tablet (5 mg total) by mouth 2 (two) times daily. 08/18/18  Yes Angiulli, Lavon Paganini, PA-C  Cyanocobalamin (VITAMIN B-12 PO) Take 2 tablets by mouth daily.   Yes [provider]  darifenacin (ENABLEX) 15 MG 24 hr tablet Take 1 tablet (15 mg total) by mouth daily. 09/23/18  Yes Sater, Nanine Means, MD  Eculizumab (SOLIRIS IV) Inject into the vein See admin instructions. 900mg  IV for 4 weeks, 1200mg  at week 5 and then 1200mg  q 2 weeks   Yes  [provider]  ibuprofen (ADVIL) 400 MG tablet Take 400 mg by mouth every 8 (eight) hours as needed. 11/19/18  Yes [provider]  lidocaine (LIDODERM) 5 % Place 1 patch onto the skin every 12 (twelve) hours. 12/01/18  Yes [provider]  loratadine (CLARITIN) 10 MG tablet Take 10 mg by mouth daily.   Yes [provider]  oxyCODONE-acetaminophen (PERCOCET) 10-325 MG tablet Take 1 tablet by mouth every 6 (six) hours as needed. 12/13/18  Yes [provider]   pantoprazole (PROTONIX) 40 MG tablet Take 1 tablet (40 mg total) by mouth 2 (two) times daily. 08/18/18  Yes Angiulli, Lavon Paganini, PA-C  pregabalin (LYRICA) 50 MG capsule Take 1 capsule (50 mg total) by mouth 3 (three) times daily. Patient taking differently: Take 50 mg by mouth 4 (four) times daily.  08/18/18  Yes Angiulli, Lavon Paganini, PA-C  valproic acid (DEPAKENE) 250 MG capsule Take 1 capsule (250 mg total) by mouth at bedtime. 08/18/18  Yes Angiulli, Lavon Paganini, PA-C  Vitamin D, Ergocalciferol, (DRISDOL) 1.25 MG (50000 UT) CAPS capsule Take 1 capsule (50,000 Units total) by mouth every Monday. 08/23/18  Yes Angiulli, Lavon Paganini, PA-C  vitamin E 1000 UNIT capsule Take 1,000 Units by mouth daily.   Yes [provider]  baclofen (LIORESAL) 10 MG tablet Take 1 tablet (10 mg total) by mouth 3 (three) times daily. Patient not taking: Reported on 12/31/2018 09/23/18   Britt Bottom, MD  predniSONE (DELTASONE) 20 MG tablet Take 1 tablet (20 mg total) by mouth daily. Patient not taking: Reported on 12/31/2018 09/23/18   Britt Bottom, MD    Current Facility-Administered Medications  Medication Dose Route Frequency Provider Last Rate Last Dose  . 0.9 %  sodium chloride infusion  250 mL Intravenous PRN Emokpae, Courage, MD      . 0.9 %  sodium chloride infusion   Intravenous Continuous Denton Brick, Courage, MD 50 mL/hr at 12/31/18 2055    . acetaminophen (TYLENOL) tablet 650 mg  650 mg Oral Q6H PRN Emokpae, Courage, MD       Or  . acetaminophen (TYLENOL) suppository 650 mg  650 mg Rectal Q6H PRN Emokpae, Courage, MD      . albuterol (PROVENTIL) (2.5 MG/3ML) 0.083% nebulizer solution 2.5 mg  2.5 mg Nebulization Q2H PRN Emokpae, Courage, MD      . ALPRAZolam Duanne Moron) tablet 0.5 mg  0.5 mg Oral TID PRN Roxan Hockey, MD   0.5 mg at 12/31/18 2043  . cefTRIAXone (ROCEPHIN) 1 g in sodium chloride 0.9 % 100 mL IVPB  1 g Intravenous Q24H Emokpae, Courage, MD 200 mL/hr at 12/31/18 1626 1 g at 12/31/18 1626  .  darifenacin (ENABLEX) 24 hr tablet 15 mg  15 mg Oral Daily Emokpae, Courage, MD   15 mg at 01/01/19 0913  . insulin aspart (novoLOG) injection 0-5 Units  0-5 Units Subcutaneous TID WC Emokpae, Courage, MD      . insulin aspart (novoLOG) injection 0-5 Units  0-5 Units Subcutaneous QHS Emokpae, Courage, MD      . lidocaine (LIDODERM) 5 % 1 patch  1 patch Transdermal q morning - 10a Emokpae, Courage, MD   1 patch at 01/01/19 0911  . loratadine (CLARITIN) tablet 10 mg  10 mg Oral Daily Emokpae, Courage, MD   10 mg at 01/01/19 0912  . ondansetron (ZOFRAN) tablet 4 mg  4 mg Oral Q6H PRN Roxan Hockey, MD       Or  .  ondansetron (ZOFRAN) injection 4 mg  4 mg Intravenous Q6H PRN Roxan Hockey, MD   4 mg at 12/31/18 2044  . oxyCODONE-acetaminophen (PERCOCET/ROXICET) 5-325 MG per tablet 1-2 tablet  1-2 tablet Oral Q4H PRN Roxan Hockey, MD   2 tablet at 01/01/19 1102  . pantoprazole (PROTONIX) injection 40 mg  40 mg Intravenous Q12H Emokpae, Courage, MD   40 mg at 01/01/19 0909  . polyethylene glycol (MIRALAX / GLYCOLAX) packet 17 g  17 g Oral BID Roxan Hockey, MD   17 g at 12/31/18 2122  . pregabalin (LYRICA) capsule 50 mg  50 mg Oral QID Roxan Hockey, MD   50 mg at 01/01/19 0912  . sodium chloride flush (NS) 0.9 % injection 3 mL  3 mL Intravenous Q12H Emokpae, Courage, MD   3 mL at 12/31/18 2115  . sodium chloride flush (NS) 0.9 % injection 3 mL  3 mL Intravenous PRN Emokpae, Courage, MD      . traZODone (DESYREL) tablet 50 mg  50 mg Oral QHS PRN Emokpae, Courage, MD      . valproic acid (DEPAKENE) 250 MG capsule 250 mg  250 mg Oral QHS Emokpae, Courage, MD   250 mg at 12/31/18 2155  . vitamin B-12 (CYANOCOBALAMIN) tablet 100 mcg  100 mcg Oral Daily Denton Brick, Courage, MD   100 mcg at 01/01/19 0912  . [START ON 01/03/2019] Vitamin D (Ergocalciferol) (DRISDOL) capsule 50,000 Units  50,000 Units Oral Q Carrolyn Meiers, Courage, MD      . vitamin E capsule 1,000 Units  1,000 Units Oral Daily  Roxan Hockey, MD   1,000 Units at 01/01/19 0914    Allergies as of 12/31/2018 - Review Complete 12/31/2018  Allergen Reaction Noted  . Amitriptyline Other (See Comments) 12/30/2012  . Bee venom Other (See Comments) 09/27/2017  . Fexofenadine Other (See Comments) 07/23/2009  . Furosemide Other (See Comments) 07/23/2009  . Gabapentin Other (See Comments) 07/23/2009  . Hydroxychloroquine sulfate Other (See Comments) 07/23/2009  . Morphine Other (See Comments) 07/23/2009  . Nsaids  07/23/2009  . Oxycodone Other (See Comments) 08/01/2018  . Topiramate Other (See Comments) 07/23/2009    Family History  Problem Relation Age of Onset  . Stroke Father     Social History   Socioeconomic History  . Marital status: Single    Spouse name: Not on file  . Number of children: 3  . Years of education: Not on file  . Highest education level: Not on file  Occupational History  . Occupation: disability  Social Needs  . Financial resource strain: Not on file  . Food insecurity    Worry: Not on file    Inability: Not on file  . Transportation needs    Medical: Not on file    Non-medical: Not on file  Tobacco Use  . Smoking status: Current Every Day Smoker    Packs/day: 0.50    Types: Cigarettes  . Smokeless tobacco: Never Used  Substance and Sexual Activity  . Alcohol use: No  . Drug use: Never  . Sexual activity: Not on file  Lifestyle  . Physical activity    Days per week: Not on file    Minutes per session: Not on file  . Stress: Not on file  Relationships  . Social Herbalist on phone: Not on file    Gets together: Not on file    Attends religious service: Not on file    Active member of club or organization:  Not on file    Attends meetings of clubs or organizations: Not on file    Relationship status: Not on file  . Intimate partner violence    Fear of current or ex partner: Not on file    Emotionally abused: Not on file    Physically abused: Not on file     Forced sexual activity: Not on file  Other Topics Concern  . Not on file  Social History Narrative   Friend lives with her   Caffeine use: coffee daily   Left handed     Review of Systems: See HPI, otherwise normal ROS  Physical Exam: Temp:  [97.8 F (36.6 C)-98.1 F (36.7 C)] 98.1 F (36.7 C) (08/29 0534) Pulse Rate:  [68-75] 75 (08/29 0534) Resp:  [15-16] 16 (08/29 0534) BP: (105-125)/(56-69) 105/56 (08/29 0534) SpO2:  [97 %-100 %] 97 % (08/29 0534) Last BM Date: 12/31/18  Patient is alert and in no acute distress. Conjunctivae is pale sclerae nonicteric. Right pupil is slightly larger than left pupil but they are both reactive to light. Multiple teeth are missing and upper and lower jaw and remaining in satisfactory condition.  She is not using partial dentures. No neck masses or thyromegaly noted. Cardiac exam with regular rhythm normal S1 and S2.  No murmur gallop noted. Auscultation of lungs reveal vesicular breath sounds bilaterally. Abdomen is symmetrical bowel sounds are normal.  Abdomen is soft with mild tenderness at LUQ and epigastric region.  No organomegaly or masses. She has trace edema around left ankle.  She has left leg weakness.  She is able to move it but unable to lift it off the bed.  Lab Results: Recent Labs    12/31/18 0915 01/01/19 0704  WBC 4.9 3.0*  HGB 8.3* 8.0*  HCT 26.9* 25.2*  PLT 155 169   BMET Recent Labs    12/31/18 0915 01/01/19 0704  NA 141 138  K 4.2 3.8  CL 110 106  CO2 25 22  GLUCOSE 92 99  BUN 14 21  CREATININE 0.96 0.78  CALCIUM 8.6* 8.9   LFT Recent Labs    12/31/18 0915  PROT 6.1*  ALBUMIN 3.2*  AST 15  ALT 9  ALKPHOS 42  BILITOT 0.5   PT/INR Recent Labs    12/31/18 0915  LABPROT 15.9*  INR 1.3*    Studies/Results: Mr Jeri Cos [id number] Contrast  Result Date: 12/31/2018 CLINICAL DATA:  68 year old female with left flank pain, weakness, nausea, vomiting and jaundice. EXAM: MRI HEAD WITHOUT AND WITH  CONTRAST TECHNIQUE: Multiplanar, multiecho pulse sequences of the brain and surrounding structures were obtained without and with intravenous contrast. CONTRAST:  6 milliliters Gadavist in conjunction with contrast enhanced imaging of the total spine reported separately. COMPARISON:  Brain MRI 07/31/2018. Total spine MRI today reported separately. CT Abdomen and Pelvis earlier today. FINDINGS: Brain: Chronic encephalomalacia in the anterior right MCA territory centered at the frontal operculum hree identified. No restricted diffusion to suggest acute infarction. No midline shift, mass effect, evidence of mass lesion, ventriculomegaly, extra-axial collection or acute intracranial hemorrhage. Cervicomedullary junction and pituitary are within normal limits. Elsewhere mild scattered bilateral cerebral white matter T2 and FLAIR hyperintensity. Although subtle Wallerian degeneration is suspected in the brainstem as seen on series 7, image 8, stable. No chronic blood products identified. No abnormal enhancement identified.  No dural thickening. Vascular: Major intracranial vascular flow voids Are stable. Major dural venous sinuses are enhancing and appear to be patent. Skull and upper  cervical spine: Cervical spine is reported separately today. Visualized bone marrow signal is within normal limits. Sinuses/Orbits: Negative orbits.  Paranasal sinuses are clear today. Other: Mastoids remain well pneumatized. Visible internal auditory structures appear normal. Nodular appearance of the bilateral parotid glands is re-demonstrated and stable. The visible submandibular glands seem to be spared. IMPRESSION: 1.  No acute intracranial abnormality. 2. Stable MRI appearance of the brain since March with chronic anterior division right MCA infarct and subtle Wallerian degeneration. 3. Total Spine MRI today reported separately. 4. Chronic nodular appearance of the parotid gland suggesting chronic/recurrent salivary gland inflammation,  such as Sjogren syndrome. Electronically Signed   By: Genevie Ann M.D.   On: 12/31/2018 19:02   Mr Cervical Spine W Or Wo Contrast  Result Date: 12/31/2018 CLINICAL DATA:  67 year old female with left flank pain, weakness, nausea, vomiting and jaundice. History of multifocal abnormal cervical and thoracic spinal cord lesions with enhancement in March of this year. The patient carries a diagnosis of collagen vascular disease and lupus. EXAM: MRI CERVICAL AND THORACIC SPINE WITHOUT AND WITH CONTRAST TECHNIQUE: Multiplanar and multiecho pulse sequences of the cervical and thoracic spine were obtained without and with intravenous contrast. CONTRAST:  6 milliliters Gadavist in conjunction with contrast enhanced imaging of the brain and lumbar spine reported separately. COMPARISON:  Cervical and thoracic MRI 07/31/2018. FINDINGS: MRI CERVICAL SPINE FINDINGS Alignment: Improved cervical lordosis from the prior. Vertebrae: No marrow edema or evidence of acute osseous abnormality. Visualized bone marrow signal is within normal limits. Cord: Progressive T2 and STIR hyperintensity in the cervical spinal cord at C2-C3 since March. Previously abnormal upper cord signal was centered at C3. See series 4, image 8 today. The cord also appears mildly expanded there. Persistent patchy T2 and STIR hyperintensity at the C3 level which is in the left hemi cord as seen on series 5, image 6. However, the abnormal cord signal which was central and to the right at C7 has regressed or largely resolved. Little if any cord myelomalacia is evident there. Furthermore, although postcontrast detail is suboptimal compared to the prior study no abnormal cord enhancement is identified today. No dural thickening. Posterior Fossa, vertebral arteries, paraspinal tissues: Cervicomedullary junction is within normal limits. Brain findings today are reported separately. Preserved major vascular flow voids in the neck. Negative neck soft tissues. Disc levels:  Cervical spine degeneration appears stable since March. There is multilevel mild degenerative cervical spinal stenosis from C3-C4 through C6-C7 related to disc bulging and ligament flavum hypertrophy. There is minimal to mild spinal cord mass effect. MRI THORACIC SPINE FINDINGS Segmentation:  Appears to be normal. Alignment:  Preserved thoracic kyphosis. Vertebrae: No marrow edema or evidence of acute osseous abnormality. Visualized bone marrow signal is within normal limits. Spinal cord: The widespread abnormal upper and midthoracic spinal cord T2 and FLAIR hyperintensity has regressed, and the cord now appears less expanded at those levels. No definite cord myelomalacia. However, there remains significant abnormal STIR signal throughout the thoracic cord which is now confluent through the midthoracic levels which previously were spared in March. See series 6, image 9). Cord volume here appears diminished. On axial images the cord signal seems to be in the left greater than right central gray matter as seen on series 7, image 21. Central cord involvement is noted in the lower thoracic spine seen at T10 on series 7, image 28. The conus medullaris seems to be spared as before. No contrast was administered in the thoracic spine previously, but there  is no abnormal intradural enhancement or dural thickening today. Paraspinal and other soft tissues: Negative visible thoracic and upper abdominal viscera, trace fluid in nondilated esophagus. Disc levels: Mild for age thoracic disc and endplate degeneration. No thoracic spinal stenosis. IMPRESSION: 1. Mixed appearance of the cervical and thoracic spinal cord lesions since March: - new/progressed cord lesion at the C2-C3 level. - regressed lower cervical cord signal abnormality. - progressed in near complete thoracic cord STIR signal abnormality now, although the cord appears less expanded and perhaps with a degree of myelomalacia in the mid thoracic cord. 2. The conus  medullaris seems to remain spared. 3. Today there is no abnormal cervical or thoracic cord enhancement. 4. Given the loss of both enhancement and cord expansion metastatic or neoplastic etiologies seem excluded. This seems to be an inflammatory process with a lack of involvement in the brain. Top differential considerations include: Lupus, Sjogren syndrome (note parotid abnormality on brain MRIs), Behcet disease, Neurosarcoidosis. Less likely considerations include multiple sclerosis, other autoimmune etiologies, and spinal cord ischemia. 5. Superimposed cervical spine degeneration with multilevel mild spinal stenosis which appears stable since March. 6. Thoracic disc and endplate degeneration without spinal stenosis. Electronically Signed   By: Genevie Ann M.D.   On: 12/31/2018 19:24   Mr Thoracic Spine W Wo Contrast  Result Date: 12/31/2018 CLINICAL DATA:  68 year old female with left flank pain, weakness, nausea, vomiting and jaundice. History of multifocal abnormal cervical and thoracic spinal cord lesions with enhancement in March of this year. The patient carries a diagnosis of collagen vascular disease and lupus. EXAM: MRI CERVICAL AND THORACIC SPINE WITHOUT AND WITH CONTRAST TECHNIQUE: Multiplanar and multiecho pulse sequences of the cervical and thoracic spine were obtained without and with intravenous contrast. CONTRAST:  6 milliliters Gadavist in conjunction with contrast enhanced imaging of the brain and lumbar spine reported separately. COMPARISON:  Cervical and thoracic MRI 07/31/2018. FINDINGS: MRI CERVICAL SPINE FINDINGS Alignment: Improved cervical lordosis from the prior. Vertebrae: No marrow edema or evidence of acute osseous abnormality. Visualized bone marrow signal is within normal limits. Cord: Progressive T2 and STIR hyperintensity in the cervical spinal cord at C2-C3 since March. Previously abnormal upper cord signal was centered at C3. See series 4, image 8 today. The cord also appears  mildly expanded there. Persistent patchy T2 and STIR hyperintensity at the C3 level which is in the left hemi cord as seen on series 5, image 6. However, the abnormal cord signal which was central and to the right at C7 has regressed or largely resolved. Little if any cord myelomalacia is evident there. Furthermore, although postcontrast detail is suboptimal compared to the prior study no abnormal cord enhancement is identified today. No dural thickening. Posterior Fossa, vertebral arteries, paraspinal tissues: Cervicomedullary junction is within normal limits. Brain findings today are reported separately. Preserved major vascular flow voids in the neck. Negative neck soft tissues. Disc levels: Cervical spine degeneration appears stable since March. There is multilevel mild degenerative cervical spinal stenosis from C3-C4 through C6-C7 related to disc bulging and ligament flavum hypertrophy. There is minimal to mild spinal cord mass effect. MRI THORACIC SPINE FINDINGS Segmentation:  Appears to be normal. Alignment:  Preserved thoracic kyphosis. Vertebrae: No marrow edema or evidence of acute osseous abnormality. Visualized bone marrow signal is within normal limits. Spinal cord: The widespread abnormal upper and midthoracic spinal cord T2 and FLAIR hyperintensity has regressed, and the cord now appears less expanded at those levels. No definite cord myelomalacia. However, there remains significant  abnormal STIR signal throughout the thoracic cord which is now confluent through the midthoracic levels which previously were spared in March. See series 6, image 9). Cord volume here appears diminished. On axial images the cord signal seems to be in the left greater than right central gray matter as seen on series 7, image 21. Central cord involvement is noted in the lower thoracic spine seen at T10 on series 7, image 28. The conus medullaris seems to be spared as before. No contrast was administered in the thoracic spine  previously, but there is no abnormal intradural enhancement or dural thickening today. Paraspinal and other soft tissues: Negative visible thoracic and upper abdominal viscera, trace fluid in nondilated esophagus. Disc levels: Mild for age thoracic disc and endplate degeneration. No thoracic spinal stenosis. IMPRESSION: 1. Mixed appearance of the cervical and thoracic spinal cord lesions since March: - new/progressed cord lesion at the C2-C3 level. - regressed lower cervical cord signal abnormality. - progressed in near complete thoracic cord STIR signal abnormality now, although the cord appears less expanded and perhaps with a degree of myelomalacia in the mid thoracic cord. 2. The conus medullaris seems to remain spared. 3. Today there is no abnormal cervical or thoracic cord enhancement. 4. Given the loss of both enhancement and cord expansion metastatic or neoplastic etiologies seem excluded. This seems to be an inflammatory process with a lack of involvement in the brain. Top differential considerations include: Lupus, Sjogren syndrome (note parotid abnormality on brain MRIs), Behcet disease, Neurosarcoidosis. Less likely considerations include multiple sclerosis, other autoimmune etiologies, and spinal cord ischemia. 5. Superimposed cervical spine degeneration with multilevel mild spinal stenosis which appears stable since March. 6. Thoracic disc and endplate degeneration without spinal stenosis. Electronically Signed   By: Genevie Ann M.D.   On: 12/31/2018 19:24   Mr Lumbar Spine W Wo Contrast  Result Date: 12/31/2018 CLINICAL DATA:  67 year old female with left flank pain, weakness, nausea, vomiting and jaundice. History of abnormal cervical and thoracic spinal cord diagnosed in March. EXAM: MRI LUMBAR SPINE WITHOUT AND WITH CONTRAST TECHNIQUE: Multiplanar and multiecho pulse sequences of the lumbar spine were obtained without and with intravenous contrast. CONTRAST:  6 milliliters Gadavist in conjunction  with contrast enhanced imaging of the brain and spine reported separately. COMPARISON:  Thoracic spine MRI today reported separately. CT Abdomen and Pelvis earlier today. Lumbar MRI 07/26/2018. FINDINGS: Segmentation: Normal, the same numbering system used on the thoracic MRI today. Alignment:  Stable lumbar lordosis since March. Vertebrae: No marrow edema or evidence of acute osseous abnormality. Visualized bone marrow signal is within normal limits. There is a small chronic inferior T11 endplate Schmorl's node. Intact visible sacrum and SI joints. Conus medullaris and cauda equina: Abnormal STIR and T2 hyperintensity in the thoracic spinal cord abates at the T10 T11 level. From T11 to the conus at L1 the lower cord appears to be normal. Unremarkable cauda quinine nerve roots aside from degenerative stenosis in the lower lumbar spine. No abnormal intradural enhancement or dural thickening. Paraspinal and other soft tissues: Negative. Disc levels: Mild lumbar degeneration without stenosis above L3-L4. L3-L4: Left eccentric disc bulge and moderate facet hypertrophy. Mild spinal stenosis and left lateral recess stenosis. Mild left L3 foraminal stenosis. This level is stable. L4-L5: Circumferential disc bulge with broad-based posterior component and central annular fissure of the disc (series 6, image 8). Moderate posterior element hypertrophy. Increased spinal stenosis at this level since March, now moderate to severe on series 5, image 24. Mild  to moderate bilateral lateral recess stenosis. Mild bilateral L4 foraminal stenosis is stable. L5-S1: Increased epidural lipomatosis at this level since March which effaces CSF from the thecal sac. Stable mild to moderate facet hypertrophy and mild foraminal disc bulging and endplate spurring. Mild mostly left side L5 foraminal stenosis is stable. IMPRESSION: 1. Normal appearance of the lower thoracic spinal cord from T11 through the conus at L1. Normal cauda equina aside from  degenerative stenosis in the lower lumbar spine. See also cervical and thoracic spine MRI today reported separately. 2. L3-L4 through L5-S1 lumbar spine degeneration with superimposed increased epidural lipomatosis since March. - subsequent increased and now Moderate to Severe L4-L5 spinal stenosis. - stable mild spinal and left lateral recess stenosis at L3-L4. Electronically Signed   By: Genevie Ann M.D.   On: 12/31/2018 19:53   Ct Abdomen Pelvis W Contrast  Result Date: 12/31/2018 CLINICAL DATA:  Left flank pain EXAM: CT ABDOMEN AND PELVIS WITH CONTRAST TECHNIQUE: Multidetector CT imaging of the abdomen and pelvis was performed using the standard protocol following bolus administration of intravenous contrast. CONTRAST:  62mL OMNIPAQUE IOHEXOL 300 MG/ML  SOLN COMPARISON:  CT 08/04/2013, 11/30/2012 FINDINGS: Lower chest: Bilateral pulmonary nodules including 5 mm posterior right lower lobe pulmonary nodule (series 4, image 27) and 6 mm juxtapleural left lower lobe pulmonary nodule (series 4, image 36). No significant interval change in size compared to prior. Hepatobiliary: No focal liver abnormality is seen. Status post cholecystectomy. No biliary dilatation. Pancreas: Slight interval enlargement of low-density lesion within the uncinate process of the pancreas (series 2, image 32) now measuring 1.9 x 1.0 cm, previously 1.6 x 0.9 cm. Internal density of this lesion measures 22 Hounsfield units, slightly greater than fluid density. The remainder of the pancreas is otherwise unremarkable without surrounding inflammatory changes. Spleen: Normal in size without focal abnormality. Adrenals/Urinary Tract: Adrenal glands are unremarkable. Kidneys are normal, without renal calculi, focal lesion, or hydronephrosis. Ureters nondilated. Bladder is unremarkable. Stomach/Bowel: Stomach is within normal limits. Appendix not clearly identified. Scattered colonic diverticulosis. No evidence of bowel wall thickening, distention,  or inflammatory changes. Vascular/Lymphatic: Aortic atherosclerosis. No enlarged abdominal or pelvic lymph nodes. Reproductive: Retroverted uterus containing a 2.9 cm low-density lesion in the fundal segment, unchanged from 11/30/2012, likely fibroid. Other: No abdominal wall hernia or abnormality. No abdominopelvic ascites. Musculoskeletal: No acute or significant osseous findings. IMPRESSION: 1. No acute abdominopelvic findings. 2. Slight interval enlargement of low-density lesion within the pancreatic uncinate process (currently 1.9 x 1.0 cm, previously 1.6 x 0.9 cm on 08/04/2013). Given the slight interval growth, surgical consultation is recommended. 3. Colonic diverticulosis without evidence of diverticulitis. 4. Stable appearance of bilateral lower lobe pulmonary nodules dating back to 08/04/2013. No further follow-up imaging is required. Electronically Signed   By: Davina Poke M.D.   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  She is also felt to have flareup of her neuromyelitis optica and receiving Solu-Medrol IV per neurology recommendations. Suspect upper GI bleed secondary to peptic ulcer disease.  Risk factors include her age chronic illness and she is on anticoagulant as well as NSAID.  Both of these medications are on hold.  She is hemodynamically stable.  Anemia secondary to GI bleed.  She has not received blood transfusion up to this point.  History of pancreatic cyst.  She had endoscopic ultrasound back in August 2014 at [facility] suggesting benign/cirrhosis.  Current CT reveals cyst dimension to be 19 x 10 mm.  This is actually measured to be slightly larger on  MRI of October 2015.  Therefore no follow-up not needed.  History of neuromyelitis optica diagnosed about 5 months ago.  She is under care of Dr. Arlice Colt and has been on Solaris.  Now she is thought to have a flareup and receiving IV Solu-Medrol.  Urinary tract infection.  Patient is on ceftriaxone.  Recommendations;  CBC in a.m. Esophagogastroduodenoscopy on 01/02/2019 under monitored anesthesia care.    LOS: 1 day   Latonda Larrivee  01/01/2019, 1:03 PM

## 2019-01-02 ENCOUNTER — Encounter (HOSPITAL_COMMUNITY)
Admission: EM | Disposition: A | Discharge status: Home / Self Care | Payer: Self-pay | Source: Home / Self Care | Attending: Family Medicine

## 2019-01-02 ENCOUNTER — Inpatient Hospital Stay (HOSPITAL_COMMUNITY): Payer: Medicare HMO | Admitting: Anesthesiology

## 2019-01-02 ENCOUNTER — Encounter (HOSPITAL_COMMUNITY): Payer: Self-pay | Admitting: *Deleted

## 2019-01-02 DIAGNOSIS — K228 Other specified diseases of esophagus: Secondary | ICD-10-CM

## 2019-01-02 HISTORY — PX: ESOPHAGOGASTRODUODENOSCOPY (EGD) WITH PROPOFOL: SHX5813

## 2019-01-02 LAB — HEMOGLOBIN AND HEMATOCRIT, BLOOD
HCT: 24.9 % — ABNORMAL LOW (ref 36.0–46.0)
HCT: 25.2 % — ABNORMAL LOW (ref 36.0–46.0)
Hemoglobin: 7.6 g/dL — ABNORMAL LOW (ref 12.0–15.0)
Hemoglobin: 7.9 g/dL — ABNORMAL LOW (ref 12.0–15.0)

## 2019-01-02 LAB — GLUCOSE, CAPILLARY
Glucose-Capillary: 68 mg/dL — ABNORMAL LOW (ref 70–99)
Glucose-Capillary: 108 mg/dL — ABNORMAL HIGH (ref 70–99)
Glucose-Capillary: 70 mg/dL (ref 70–99)
Glucose-Capillary: 77 mg/dL (ref 70–99)
Glucose-Capillary: 87 mg/dL (ref 70–99)
Glucose-Capillary: 112 mg/dL — ABNORMAL HIGH (ref 70–99)

## 2019-01-02 LAB — TYPE AND SCREEN
ABO/RH(D): B POS
Antibody Screen: NEGATIVE

## 2019-01-02 SURGERY — ESOPHAGOGASTRODUODENOSCOPY (EGD) WITH PROPOFOL
Anesthesia: General

## 2019-01-02 MED ORDER — PROPOFOL 500 MG/50ML IV EMUL
INTRAVENOUS | Status: DC | PRN
  Administered 2019-01-02: 50 mg via INTRAVENOUS

## 2019-01-02 MED ORDER — PEG 3350-KCL-NA BICARB-NACL 420 G PO SOLR
4000.0000 mL | Freq: Once | ORAL | Status: AC
  Administered 2019-01-02: 4000 mL via ORAL

## 2019-01-02 MED ORDER — PROPOFOL 10 MG/ML IV BOLUS
INTRAVENOUS | Status: AC
  Filled 2019-01-02: qty 20

## 2019-01-02 MED ORDER — DEXTROSE 50 % IV SOLN
INTRAVENOUS | Status: AC
  Administered 2019-01-02: 50 mL
  Filled 2019-01-02: qty 50

## 2019-01-02 MED ORDER — KETAMINE HCL 10 MG/ML IJ SOLN
INTRAMUSCULAR | Status: DC | PRN
  Administered 2019-01-02: 10 mg via INTRAVENOUS

## 2019-01-02 MED ORDER — LIDOCAINE HCL (PF) 2 % IJ SOLN
INTRAMUSCULAR | Status: AC
  Filled 2019-01-02: qty 10

## 2019-01-02 MED ORDER — EPHEDRINE SULFATE 50 MG/ML IJ SOLN: INTRAMUSCULAR | Status: DC | PRN

## 2019-01-02 MED ORDER — KETAMINE HCL 50 MG/5ML IJ SOSY
PREFILLED_SYRINGE | INTRAMUSCULAR | Status: AC
  Filled 2019-01-02: qty 5

## 2019-01-02 MED ORDER — DEXTROSE-NACL 5-0.45 % IV SOLN
INTRAVENOUS | Status: DC
  Administered 2019-01-02 – 2019-01-03 (×3): via INTRAVENOUS

## 2019-01-02 MED ORDER — ETOMIDATE 2 MG/ML IV SOLN
INTRAVENOUS | Status: AC
  Filled 2019-01-02: qty 10

## 2019-01-02 MED ORDER — KETAMINE HCL 10 MG/ML IJ SOLN: INTRAMUSCULAR | Status: DC | PRN

## 2019-01-02 MED ORDER — EPHEDRINE SULFATE 50 MG/ML IJ SOLN
INTRAMUSCULAR | Status: DC | PRN
  Administered 2019-01-02: 10 mg via INTRAVENOUS

## 2019-01-02 MED ORDER — LIDOCAINE HCL (CARDIAC) PF 100 MG/5ML IV SOSY
PREFILLED_SYRINGE | INTRAVENOUS | Status: DC | PRN
  Administered 2019-01-02: 60 mg via INTRATRACHEAL

## 2019-01-02 MED ORDER — LACTATED RINGERS IV SOLN
INTRAVENOUS | Status: DC | PRN
  Administered 2019-01-02: 10:00:00 via INTRAVENOUS

## 2019-01-02 NOTE — Transfer of Care (Signed)
Immediate Anesthesia Transfer of Care Note  Patient: Susan Hodge  Procedure(s) Performed: ESOPHAGOGASTRODUODENOSCOPY (EGD) WITH PROPOFOL (N/A )  Patient Location: PACU  Anesthesia Type:General  Level of Consciousness: awake, alert , oriented and drowsy  Airway & Oxygen Therapy: Patient Spontanous Breathing and Patient connected to nasal cannula oxygen  Post-op Assessment: Report given to RN  Post vital signs: Reviewed and stable  Last Vitals:  Vitals Value Taken Time  BP 103/67 01/02/19 0945  Temp 36.3 C 01/02/19 0939  Pulse 67 01/02/19 0951  Resp 17 01/02/19 0951  SpO2 97 % 01/02/19 0951  Vitals shown include unvalidated device data.  Last Pain:  Vitals:   01/02/19 0856  TempSrc: Oral  PainSc: 6       Patients Stated Pain Goal: 2 ([id number])  Complications: No apparent anesthesia complications

## 2019-01-02 NOTE — Progress Notes (Signed)
Patient is agreeable to proceed with colonoscopy. She will be prep this afternoon and undergo colonoscopy on 01/03/2019 with propofol. Hemoglobin this afternoon is 7.9.

## 2019-01-02 NOTE — Progress Notes (Signed)
Patient Demographics:    Susan Hodge, is a 68 y.o. 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 female with past medical history remarkable for NMO with chronic left lower extremity weakness presents with 1 week of  Worsening left lower extremity weakness, generalized weakness, abdominal pain, nausea vomiting and melena and found to have hemoglobin drop of almost 4 g from baseline with heme positive stools   A/p  1)Acute Symptomatic Anemia--- suspect due to acute blood loss anemia due to GI bleed in the setting of concomitant Eliquis and ibuprofen use patient baseline hemoglobin usually around 12 , Hgb is now 7.9 -Continue to hold Eliquis,   -Last dose of Eliquis was 8pm on 12/30/2018 -Continue IV Protonix -GI consult appreciated -EGD on 01/02/2019 largely unremarkable -For colonoscopy   on 01/03/2019 -pt has dizziness, dyspnea on exertion, fatigue and weakness   2)Pancreatic Mass/Cyst --- CT abdomen and pelvis showed interval enlargement of low-density lesion within the pancreatic uncinate process (currently 1.9 x 1.0 cm, previously 1.6 x 0.9 cm on 08/04/2013).  However upon further review -   She had endoscopic ultrasound back in August 2014 at [facility] suggesting benign cyst.   Current CT  reveals cyst dimension to be 19 x 10 mm.  This is actually measured to be slightly larger on MRI of October 2015.   -GI recommends no further work-up at this time  3)Left lower Extremity DVT/chronic Anticoagulation---  patient was diagnosed with DVT on 08/01/2018,  -hold Eliquis due to acute GI bleed -Last dose of Eliquis was 8pm on 12/30/2018  4) Klebsiella UTI--continue IV Rocephin pending sensitivity of cultures  5) Possible NMO Flare up--as per Tele-neurologist suspect flareup of neuromyelitis, telemetry neurologist advised IV Solu-Medrol 1 g daily for up to 5 days -neurologist also advised--MRI brain C-spine T-spine and L-spine -Discussed with Dr. Merlene Laughter the local neurologist who would be available to evaluate patient on 01/03/2019 if needed -- -PTA patient was on IV Soliris every 2 weeks -I will hold off on further IV steroids given possible upper GI bleed -MRI brain and MRI lumbar spine without new acute findings -MRI cervical and thoracic spine with mixed appearance / findings--may be related to lupus or NMO -Hold off on high-dose IV steroids given acute GI bleed  6)Hypoglycemia--- due to n.p.o. status for EGD, received dextrose infusion -Continue D5 half-normal as pt will be n.p.o. again for colonoscopy on 01/03/2019   Disposition/Need for in-Hospital Stay- patient unable to be discharged at this time due to symptomatic anemia secondary to acute GI bleed  with 4 g drop in hemoglobin-pt has dizziness, dyspnea on exertion, fatigue and weakness -Awaiting colonoscopy on 01/03/2019  Code Status : Full  Family Communication:   NA (patient is alert, awake and coherent) -Patient states no need to contact any friends or family  Disposition Plan  : TBD  Consults  :  Gi/Gensurgery  Procedures- EGD on 01/02/2019 largely unremarkable -For colonoscopy  on 01/03/2019  DVT Prophylaxis  :    - SCDs (Gi Bleed)  Lab Results  Component Value Date   PLT 169 01/01/2019    Inpatient  Medications  Scheduled Meds:  ALPRAZolam  0.5 mg Oral QHS   darifenacin  15 mg Oral Daily   insulin aspart  0-5 Units Subcutaneous TID WC   insulin aspart  0-5 Units Subcutaneous QHS   lidocaine  1 patch Transdermal q morning - 10a   loratadine  10 mg Oral Daily   pantoprazole (PROTONIX) IV  40 mg Intravenous Q12H   polyethylene glycol  17 g Oral BID   pregabalin  50 mg Oral QID   sodium chloride flush  3 mL Intravenous Q12H   valproic acid  250 mg Oral QHS   vitamin B-12  100 mcg Oral Daily   [START ON 01/03/2019] Vitamin D (Ergocalciferol)  50,000 Units Oral Q Mon   vitamin E  1,000 Units Oral Daily   Continuous Infusions:  sodium chloride     cefTRIAXone (ROCEPHIN)  IV 1 g (01/01/19 1456)   dextrose 5 % and 0.45% NaCl 75 mL/hr at 01/02/19 0823   PRN Meds:.sodium chloride, acetaminophen **OR** acetaminophen, albuterol, ALPRAZolam, ondansetron **OR** ondansetron (ZOFRAN) IV, oxyCODONE-acetaminophen, sodium chloride flush, traZODone  Anti-infectives (From admission, onward)   Start     Dose/Rate Route Frequency Ordered Stop   12/31/18 1530  cefTRIAXone (ROCEPHIN) 1 g in sodium chloride 0.9 % 100 mL IVPB     1 g 200 mL/hr over 30 Minutes Intravenous Every 24 hours 12/31/18 1508          Objective:   Vitals:   01/02/19 0939 01/02/19 0945 01/02/19 1000 01/02/19 1015  BP: (!) 100/46 103/67 (!) 105/47 (!) 103/48  Pulse: 72 68 61 63  Resp:  13 13 15   Temp: (!) 97.4 F (36.3 C)     TempSrc:      SpO2: 100% 99% 97% 98%  Weight:      Height:        Wt Readings from Last 3 Encounters:  12/31/18 56.2 kg  09/09/18 56.2 kg  07/31/18 57.1 kg    Intake/Output Summary (Last 24 hours) at 01/02/2019 1342 Last data filed at 01/02/2019 0950 Gross per 24 hour  Intake 1763.42 ml  Output 150 ml  Net 1613.42 ml    Physical Exam  Gen:- Awake Alert,  In no apparent distress  HEENT:- [place].AT, No sclera icterus Neck-Supple Neck,No JVD,.  Lungs-  CTAB , fair  symmetrical air movement CV- S1, S2 normal, regular  Abd-  +ve B.Sounds, Abd Soft, no significant tenderness extremity/Skin:-  pedal pulses present  Psych-affect is appropriate, oriented x3 Neuro-generalized weakness, no new focal deficits, no tremors MSK--chronic left lower extremity weakness and swelling   Data Review:   Micro Results Recent Results (from the past 240 hour(s))  Urine Culture     Status: Abnormal (Preliminary result)   Collection Time: 12/31/18 10:50 AM   Specimen: Urine, Catheterized  Result Value Ref Range Status   Specimen Description   Final    URINE, CATHETERIZED Performed at  [facility], [address], [place], [place] [address]    Special Requests   Final    Normal Performed at [facility], [address] [place], [place] [address]    Culture (A)  Final    >=100,000 COLONIES/mL KLEBSIELLA PNEUMONIAE SUSCEPTIBILITIES TO FOLLOW Performed at [facility], [place] [address] [place], [place] [address]    Report Status PENDING  Incomplete  SARS Coronavirus 2 [facility] order, Performed in [facility] hospital lab) Nasopharyngeal Nasopharyngeal Swab     Status: None   Collection Time: 12/31/18  3:40 PM   Specimen: Nasopharyngeal Swab  Result Value Ref Range Status   SARS Coronavirus 2 NEGATIVE NEGATIVE Final    Comment: (NOTE) If result is NEGATIVE SARS-CoV-2 target nucleic acids are NOT DETECTED. The SARS-CoV-2 RNA is generally detectable in upper and lower  respiratory specimens during the acute phase of infection. The lowest  concentration of SARS-CoV-2 viral copies this assay can detect is 250  copies / mL. A negative result does not preclude SARS-CoV-2 infection  and should not be used as the sole basis for treatment or other  patient management decisions.  A negative result may occur with  improper specimen collection / handling, submission of specimen other  than nasopharyngeal swab, presence of viral mutation(s) within the  areas targeted by  this assay, and inadequate number of viral copies  (<250 copies / mL). A negative result must be combined with clinical  observations, patient history, and epidemiological information. If result is POSITIVE SARS-CoV-2 target nucleic acids are DETECTED. The SARS-CoV-2 RNA is generally detectable in upper and lower  respiratory specimens dur ing the acute phase of infection.  Positive  results are indicative of active infection with SARS-CoV-2.  Clinical  correlation with patient history and other diagnostic information is  necessary to determine patient infection status.  Positive results do  not rule out bacterial infection or co-infection with other viruses. If result is PRESUMPTIVE POSTIVE SARS-CoV-2 nucleic acids MAY BE PRESENT.   A presumptive positive result was obtained on the submitted specimen  and confirmed on repeat testing.  While 2019 novel coronavirus  (SARS-CoV-2) nucleic acids may be present in the submitted sample  additional confirmatory testing may be necessary for epidemiological  and / or clinical management purposes  to differentiate between  SARS-CoV-2 and other Sarbecovirus currently known to infect humans.  If clinically indicated additional testing with an alternate test  methodology [phone]) is advised. The SARS-CoV-2 RNA is generally  detectable in upper and lower respiratory sp ecimens during the acute  phase of infection. The expected result is Negative. Fact Sheet for Patients:  StrictlyIdeas.no Fact Sheet for Healthcare Providers: BankingDealers.co.za This test is not yet approved or cleared by the [place] FDA and has been authorized for detection and/or diagnosis of SARS-CoV-2 by FDA under an Emergency Use Authorization (EUA).  This EUA will remain in effect (meaning this test can be used) for the duration of the COVID-19 declaration under Section 564(b)(1) of the Act, 21 U.S.C. section  360bbb-3(b)(1), unless the authorization is terminated or revoked sooner. Performed at [facility], [address] [place], [place] [address]    Radiology Reports Mr Jeri Cos Wo Contrast  Result Date: 12/31/2018 CLINICAL DATA:  68 year old female with left flank pain, weakness, nausea, vomiting and jaundice. EXAM: MRI HEAD WITHOUT AND WITH CONTRAST TECHNIQUE: Multiplanar, multiecho pulse sequences of the brain and surrounding structures were obtained without and with intravenous contrast. CONTRAST:  6 milliliters Gadavist in  conjunction with contrast enhanced imaging of the total spine reported separately. COMPARISON:  Brain MRI 07/31/2018. Total spine MRI today reported separately. CT Abdomen and Pelvis earlier today. FINDINGS: Brain: Chronic encephalomalacia in the anterior right MCA territory centered at the frontal operculum hree identified. No restricted diffusion to suggest acute infarction. No midline shift, mass effect, evidence of mass lesion, ventriculomegaly, extra-axial collection or acute intracranial hemorrhage. Cervicomedullary junction and pituitary are within normal limits. Elsewhere mild scattered bilateral cerebral white matter T2 and FLAIR hyperintensity. Although subtle Wallerian degeneration is suspected in the brainstem as seen on series 7, image 8, stable. No chronic blood products identified. No abnormal enhancement identified.  No dural thickening. Vascular: Major intracranial vascular flow voids Are stable. Major dural venous sinuses are enhancing and appear to be patent. Skull and upper cervical spine: Cervical spine is reported separately today. Visualized bone marrow signal is within normal limits. Sinuses/Orbits: Negative orbits.  Paranasal sinuses are clear today. Other: Mastoids remain well pneumatized. Visible internal auditory structures appear normal. Nodular appearance of the bilateral parotid glands is re-demonstrated and stable. The visible submandibular glands seem to  be spared. IMPRESSION: 1.  No acute intracranial abnormality. 2. Stable MRI appearance of the brain since March with chronic anterior division right MCA infarct and subtle Wallerian degeneration. 3. Total Spine MRI today reported separately. 4. Chronic nodular appearance of the parotid gland suggesting chronic/recurrent salivary gland inflammation, such as Sjogren syndrome. Electronically Signed   By: Genevie Ann M.D.   On: 12/31/2018 19:02   Mr Cervical Spine W Or Wo Contrast  Result Date: 12/31/2018 CLINICAL DATA:  68 year old female with left flank pain, weakness, nausea, vomiting and jaundice. History of multifocal abnormal cervical and thoracic spinal cord lesions with enhancement in March of this year. The patient carries a diagnosis of collagen vascular disease and lupus. EXAM: MRI CERVICAL AND THORACIC SPINE WITHOUT AND WITH CONTRAST TECHNIQUE: Multiplanar and multiecho pulse sequences of the cervical and thoracic spine were obtained without and with intravenous contrast. CONTRAST:  6 milliliters Gadavist in conjunction with contrast enhanced imaging of the brain and lumbar spine reported separately. COMPARISON:  Cervical and thoracic MRI 07/31/2018. FINDINGS: MRI CERVICAL SPINE FINDINGS Alignment: Improved cervical lordosis from the prior. Vertebrae: No marrow edema or evidence of acute osseous abnormality. Visualized bone marrow signal is within normal limits. Cord: Progressive T2 and STIR hyperintensity in the cervical spinal cord at C2-C3 since March. Previously abnormal upper cord signal was centered at C3. See series 4, image 8 today. The cord also appears mildly expanded there. Persistent patchy T2 and STIR hyperintensity at the C3 level which is in the left hemi cord as seen on series 5, image 6. However, the abnormal cord signal which was central and to the right at C7 has regressed or largely resolved. Little if any cord myelomalacia is evident there. Furthermore, although postcontrast detail is  suboptimal compared to the prior study no abnormal cord enhancement is identified today. No dural thickening. Posterior Fossa, vertebral arteries, paraspinal tissues: Cervicomedullary junction is within normal limits. Brain findings today are reported separately. Preserved major vascular flow voids in the neck. Negative neck soft tissues. Disc levels: Cervical spine degeneration appears stable since March. There is multilevel mild degenerative cervical spinal stenosis from C3-C4 through C6-C7 related to disc bulging and ligament flavum hypertrophy. There is minimal to mild spinal cord mass effect. MRI THORACIC SPINE FINDINGS Segmentation:  Appears to be normal. Alignment:  Preserved thoracic kyphosis. Vertebrae: No marrow edema or evidence of  acute osseous abnormality. Visualized bone marrow signal is within normal limits. Spinal cord: The widespread abnormal upper and midthoracic spinal cord T2 and FLAIR hyperintensity has regressed, and the cord now appears less expanded at those levels. No definite cord myelomalacia. However, there remains significant abnormal STIR signal throughout the thoracic cord which is now confluent through the midthoracic levels which previously were spared in March. See series 6, image 9). Cord volume here appears diminished. On axial images the cord signal seems to be in the left greater than right central gray matter as seen on series 7, image 21. Central cord involvement is noted in the lower thoracic spine seen at T10 on series 7, image 28. The conus medullaris seems to be spared as before. No contrast was administered in the thoracic spine previously, but there is no abnormal intradural enhancement or dural thickening today. Paraspinal and other soft tissues: Negative visible thoracic and upper abdominal viscera, trace fluid in nondilated esophagus. Disc levels: Mild for age thoracic disc and endplate degeneration. No thoracic spinal stenosis. IMPRESSION: 1. Mixed appearance of the  cervical and thoracic spinal cord lesions since March: - new/progressed cord lesion at the C2-C3 level. - regressed lower cervical cord signal abnormality. - progressed in near complete thoracic cord STIR signal abnormality now, although the cord appears less expanded and perhaps with a degree of myelomalacia in the mid thoracic cord. 2. The conus medullaris seems to remain spared. 3. Today there is no abnormal cervical or thoracic cord enhancement. 4. Given the loss of both enhancement and cord expansion metastatic or neoplastic etiologies seem excluded. This seems to be an inflammatory process with a lack of involvement in the brain. Top differential considerations include: Lupus, Sjogren syndrome (note parotid abnormality on brain MRIs), Behcet disease, Neurosarcoidosis. Less likely considerations include multiple sclerosis, other autoimmune etiologies, and spinal cord ischemia. 5. Superimposed cervical spine degeneration with multilevel mild spinal stenosis which appears stable since March. 6. Thoracic disc and endplate degeneration without spinal stenosis. Electronically Signed   By: Genevie Ann M.D.   On: 12/31/2018 19:24   Mr Thoracic Spine W Wo Contrast  Result Date: 12/31/2018 CLINICAL DATA:  68 year old female with left flank pain, weakness, nausea, vomiting and jaundice. History of multifocal abnormal cervical and thoracic spinal cord lesions with enhancement in March of this year. The patient carries a diagnosis of collagen vascular disease and lupus. EXAM: MRI CERVICAL AND THORACIC SPINE WITHOUT AND WITH CONTRAST TECHNIQUE: Multiplanar and multiecho pulse sequences of the cervical and thoracic spine were obtained without and with intravenous contrast. CONTRAST:  6 milliliters Gadavist in conjunction with contrast enhanced imaging of the brain and lumbar spine reported separately. COMPARISON:  Cervical and thoracic MRI 07/31/2018. FINDINGS: MRI CERVICAL SPINE FINDINGS Alignment: Improved cervical  lordosis from the prior. Vertebrae: No marrow edema or evidence of acute osseous abnormality. Visualized bone marrow signal is within normal limits. Cord: Progressive T2 and STIR hyperintensity in the cervical spinal cord at C2-C3 since March. Previously abnormal upper cord signal was centered at C3. See series 4, image 8 today. The cord also appears mildly expanded there. Persistent patchy T2 and STIR hyperintensity at the C3 level which is in the left hemi cord as seen on series 5, image 6. However, the abnormal cord signal which was central and to the right at C7 has regressed or largely resolved. Little if any cord myelomalacia is evident there. Furthermore, although postcontrast detail is suboptimal compared to the prior study no abnormal cord enhancement is identified  today. No dural thickening. Posterior Fossa, vertebral arteries, paraspinal tissues: Cervicomedullary junction is within normal limits. Brain findings today are reported separately. Preserved major vascular flow voids in the neck. Negative neck soft tissues. Disc levels: Cervical spine degeneration appears stable since March. There is multilevel mild degenerative cervical spinal stenosis from C3-C4 through C6-C7 related to disc bulging and ligament flavum hypertrophy. There is minimal to mild spinal cord mass effect. MRI THORACIC SPINE FINDINGS Segmentation:  Appears to be normal. Alignment:  Preserved thoracic kyphosis. Vertebrae: No marrow edema or evidence of acute osseous abnormality. Visualized bone marrow signal is within normal limits. Spinal cord: The widespread abnormal upper and midthoracic spinal cord T2 and FLAIR hyperintensity has regressed, and the cord now appears less expanded at those levels. No definite cord myelomalacia. However, there remains significant abnormal STIR signal throughout the thoracic cord which is now confluent through the midthoracic levels which previously were spared in March. See series 6, image 9). Cord  volume here appears diminished. On axial images the cord signal seems to be in the left greater than right central gray matter as seen on series 7, image 21. Central cord involvement is noted in the lower thoracic spine seen at T10 on series 7, image 28. The conus medullaris seems to be spared as before. No contrast was administered in the thoracic spine previously, but there is no abnormal intradural enhancement or dural thickening today. Paraspinal and other soft tissues: Negative visible thoracic and upper abdominal viscera, trace fluid in nondilated esophagus. Disc levels: Mild for age thoracic disc and endplate degeneration. No thoracic spinal stenosis. IMPRESSION: 1. Mixed appearance of the cervical and thoracic spinal cord lesions since March: - new/progressed cord lesion at the C2-C3 level. - regressed lower cervical cord signal abnormality. - progressed in near complete thoracic cord STIR signal abnormality now, although the cord appears less expanded and perhaps with a degree of myelomalacia in the mid thoracic cord. 2. The conus medullaris seems to remain spared. 3. Today there is no abnormal cervical or thoracic cord enhancement. 4. Given the loss of both enhancement and cord expansion metastatic or neoplastic etiologies seem excluded. This seems to be an inflammatory process with a lack of involvement in the brain. Top differential considerations include: Lupus, Sjogren syndrome (note parotid abnormality on brain MRIs), Behcet disease, Neurosarcoidosis. Less likely considerations include multiple sclerosis, other autoimmune etiologies, and spinal cord ischemia. 5. Superimposed cervical spine degeneration with multilevel mild spinal stenosis which appears stable since March. 6. Thoracic disc and endplate degeneration without spinal stenosis. Electronically Signed   By: Genevie Ann M.D.   On: 12/31/2018 19:24   Mr Lumbar Spine W Wo Contrast  Result Date: 12/31/2018 CLINICAL DATA:  68 year old female with  left flank pain, weakness, nausea, vomiting and jaundice. History of abnormal cervical and thoracic spinal cord diagnosed in March. EXAM: MRI LUMBAR SPINE WITHOUT AND WITH CONTRAST TECHNIQUE: Multiplanar and multiecho pulse sequences of the lumbar spine were obtained without and with intravenous contrast. CONTRAST:  6 milliliters Gadavist in conjunction with contrast enhanced imaging of the brain and spine reported separately. COMPARISON:  Thoracic spine MRI today reported separately. CT Abdomen and Pelvis earlier today. Lumbar MRI 07/26/2018. FINDINGS: Segmentation: Normal, the same numbering system used on the thoracic MRI today. Alignment:  Stable lumbar lordosis since March. Vertebrae: No marrow edema or evidence of acute osseous abnormality. Visualized bone marrow signal is within normal limits. There is a small chronic inferior T11 endplate Schmorl's node. Intact visible sacrum and SI  joints. Conus medullaris and cauda equina: Abnormal STIR and T2 hyperintensity in the thoracic spinal cord abates at the T10 T11 level. From T11 to the conus at L1 the lower cord appears to be normal. Unremarkable cauda quinine nerve roots aside from degenerative stenosis in the lower lumbar spine. No abnormal intradural enhancement or dural thickening. Paraspinal and other soft tissues: Negative. Disc levels: Mild lumbar degeneration without stenosis above L3-L4. L3-L4: Left eccentric disc bulge and moderate facet hypertrophy. Mild spinal stenosis and left lateral recess stenosis. Mild left L3 foraminal stenosis. This level is stable. L4-L5: Circumferential disc bulge with broad-based posterior component and central annular fissure of the disc (series 6, image 8). Moderate posterior element hypertrophy. Increased spinal stenosis at this level since March, now moderate to severe on series 5, image 24. Mild to moderate bilateral lateral recess stenosis. Mild bilateral L4 foraminal stenosis is stable. L5-S1: Increased epidural  lipomatosis at this level since March which effaces CSF from the thecal sac. Stable mild to moderate facet hypertrophy and mild foraminal disc bulging and endplate spurring. Mild mostly left side L5 foraminal stenosis is stable. IMPRESSION: 1. Normal appearance of the lower thoracic spinal cord from T11 through the conus at L1. Normal cauda equina aside from degenerative stenosis in the lower lumbar spine. See also cervical and thoracic spine MRI today reported separately. 2. L3-L4 through L5-S1 lumbar spine degeneration with superimposed increased epidural lipomatosis since March. - subsequent increased and now Moderate to Severe L4-L5 spinal stenosis. - stable mild spinal and left lateral recess stenosis at L3-L4. Electronically Signed   By: Genevie Ann M.D.   On: 12/31/2018 19:53   Ct Abdomen Pelvis W Contrast  Result Date: 12/31/2018 CLINICAL DATA:  Left flank pain EXAM: CT ABDOMEN AND PELVIS WITH CONTRAST TECHNIQUE: Multidetector CT imaging of the abdomen and pelvis was performed using the standard protocol following bolus administration of intravenous contrast. CONTRAST:  39mL OMNIPAQUE IOHEXOL 300 MG/ML  SOLN COMPARISON:  CT 08/04/2013, 11/30/2012 FINDINGS: Lower chest: Bilateral pulmonary nodules including 5 mm posterior right lower lobe pulmonary nodule (series 4, image 27) and 6 mm juxtapleural left lower lobe pulmonary nodule (series 4, image 36). No significant interval change in size compared to prior. Hepatobiliary: No focal liver abnormality is seen. Status post cholecystectomy. No biliary dilatation. Pancreas: Slight interval enlargement of low-density lesion within the uncinate process of the pancreas (series 2, image 32) now measuring 1.9 x 1.0 cm, previously 1.6 x 0.9 cm. Internal density of this lesion measures 22 Hounsfield units, slightly greater than fluid density. The remainder of the pancreas is otherwise unremarkable without surrounding inflammatory changes. Spleen: Normal in size without  focal abnormality. Adrenals/Urinary Tract: Adrenal glands are unremarkable. Kidneys are normal, without renal calculi, focal lesion, or hydronephrosis. Ureters nondilated. Bladder is unremarkable. Stomach/Bowel: Stomach is within normal limits. Appendix not clearly identified. Scattered colonic diverticulosis. No evidence of bowel wall thickening, distention, or inflammatory changes. Vascular/Lymphatic: Aortic atherosclerosis. No enlarged abdominal or pelvic lymph nodes. Reproductive: Retroverted uterus containing a 2.9 cm low-density lesion in the fundal segment, unchanged from 11/30/2012, likely fibroid. Other: No abdominal wall hernia or abnormality. No abdominopelvic ascites. Musculoskeletal: No acute or significant osseous findings. IMPRESSION: 1. No acute abdominopelvic findings. 2. Slight interval enlargement of low-density lesion within the pancreatic uncinate process (currently 1.9 x 1.0 cm, previously 1.6 x 0.9 cm on 08/04/2013). Given the slight interval growth, surgical consultation is recommended. 3. Colonic diverticulosis without evidence of diverticulitis. 4. Stable appearance of bilateral lower lobe pulmonary  nodules dating back to 08/04/2013. No further follow-up imaging is required. Electronically Signed   By: Davina Poke M.D.   On: 12/31/2018 10:49    CBC Recent Labs  Lab 12/31/18 0915 01/01/19 0704 01/02/19 0656 01/02/19 1207  WBC 4.9 3.0*  --   --   HGB 8.3* 8.0* 7.6* 7.9*  HCT 26.9* 25.2* 24.9* 25.2*  PLT 155 169  --   --   MCV 87.3 85.1  --   --   MCH 26.9 27.0  --   --   MCHC 30.9 31.7  --   --   RDW 14.8 14.6  --   --   LYMPHSABS 1.0  --   --   --   MONOABS 0.3  --   --   --   EOSABS 0.1  --   --   --   BASOSABS 0.0  --   --   --     Chemistries  Recent Labs  Lab 12/31/18 0915 01/01/19 0704  NA 141 138  K 4.2 3.8  CL 110 106  CO2 25 22  GLUCOSE 92 99  BUN 14 21  CREATININE 0.96 0.78  CALCIUM 8.6* 8.9  AST 15  --   ALT 9  --   ALKPHOS 42  --     BILITOT 0.5  --    ------------------------------------------------------------------------------------------------------------------ No results for input(s): CHOL, HDL, LDLCALC, TRIG, CHOLHDL, LDLDIRECT in the last 72 hours.  No results found for: HGBA1C ------------------------------------------------------------------------------------------------------------------ No results for input(s): TSH, T4TOTAL, T3FREE, THYROIDAB in the last 72 hours.  Invalid input(s): FREET3 ------------------------------------------------------------------------------------------------------------------ No results for input(s): VITAMINB12, FOLATE, FERRITIN, TIBC, IRON, RETICCTPCT in the last 72 hours.  Coagulation profile Recent Labs  Lab 12/31/18 0915  INR 1.3*    No results for input(s): DDIMER in the last 72 hours.  Cardiac Enzymes No results for input(s): CKMB, TROPONINI, MYOGLOBIN in the last 168 hours.  Invalid input(s): CK ------------------------------------------------------------------------------------------------------------------ No results found for: BNP   Roxan Hockey M.D on 01/02/2019 at 1:42 PM  Go to [email or web address] - for contact info  [facility] - Office  [phone]

## 2019-01-02 NOTE — Progress Notes (Signed)
CRITICAL VALUE ALERT  Critical Value:  84  Date & Time Notied: 0730  Provider Notified: Dr. Denton Brick, [place]  Orders Received/Actions taken: yes

## 2019-01-02 NOTE — Anesthesia Preprocedure Evaluation (Addendum)
Anesthesia Evaluation  Patient identified by MRN, date of birth, ID band Patient awake    Reviewed: Allergy & Precautions, NPO status , Patient's Chart, lab work & pertinent test results  History of Anesthesia Complications (+) history of anesthetic complications  Airway Mallampati: II  TM Distance: >3 FB Neck ROM: Full    Dental  (+) Partial Upper, Partial Lower, Caps,    Pulmonary Current Smoker,    breath sounds clear to auscultation       Cardiovascular + angina + DVT (Was on eliquis )  Normal cardiovascular exam Rhythm:Regular Rate:Normal  31-Dec-2018 08:53:55 [facility] ROUTINE RECORD Sinus rhythm Atrial premature complex RSR' in V1 or V2, right VCD or RVH   Neuro/Psych  Headaches, neg Seizures PSYCHIATRIC DISORDERS Anxiety Depression Neuromyelitis optica, was on steroids  Neuromuscular disease    GI/Hepatic GERD  Medicated,  Endo/Other  Hypothyroidism   Renal/GU Renal disease     Musculoskeletal  (+) Arthritis , Fibromyalgia -, narcotic dependent  Abdominal   Peds  Hematology  (+) anemia , H/H - 7.6/24.9   Anesthesia Other Findings   Reproductive/Obstetrics                            Anesthesia Physical Anesthesia Plan  ASA: IV  Anesthesia Plan: General   Post-op Pain Management:    Induction:   PONV Risk Score and Plan:   Airway Management Planned: Natural Airway and Nasal Cannula  Additional Equipment:   Intra-op Plan:   Post-operative Plan: Possible Post-op intubation/ventilation  Informed Consent: I have reviewed the patients History and Physical, chart, labs and discussed the procedure including the risks, benefits and alternatives for the proposed anesthesia with the patient or authorized representative who has indicated his/her understanding and acceptance.     Dental advisory given  Plan Discussed with:   Anesthesia Plan Comments:         Anesthesia Quick Evaluation

## 2019-01-02 NOTE — Anesthesia Postprocedure Evaluation (Signed)
Anesthesia Post Note  Patient: Susan Hodge  Procedure(s) Performed: ESOPHAGOGASTRODUODENOSCOPY (EGD) WITH PROPOFOL (N/A )  Patient location during evaluation: PACU Anesthesia Type: General Level of consciousness: awake and alert and oriented Pain management: pain level controlled Vital Signs Assessment: post-procedure vital signs reviewed and stable Respiratory status: spontaneous breathing and respiratory function stable Cardiovascular status: blood pressure returned to baseline and stable Anesthetic complications: no     Last Vitals:  Vitals:   01/02/19 0939 01/02/19 0945  BP: (!) 100/46 103/67  Pulse: 72 68  Resp:  13  Temp: (!) 36.3 C   SpO2: 100% 99%    Last Pain:  Vitals:   01/02/19 0939  TempSrc:   PainSc: 0-No pain                 Deana Krock C Keymiah Lyles

## 2019-01-02 NOTE — Progress Notes (Signed)
Brief EGD note:  Normal examination esophagus stomach first and second part of duodenum.

## 2019-01-02 NOTE — Op Note (Signed)
[facility] Patient Name: Susan Hodge Procedure Date: 01/02/2019 8:07 AM MRN: [id number] Date of Birth: 11/23/1950 Attending MD: Hildred Laser , MD CSN: [id number] Age: 68 Admit Type: Inpatient Procedure:                Upper GI endoscopy Indications:              Melena Providers:                Hildred Laser, MD, Lurline Del, RN, Aram Candela Referring MD:             Roxan Hockey, MD Medicines:                Propofol per Anesthesia Complications:            No immediate complications. Estimated Blood Loss:     Estimated blood loss: none. Procedure:                Pre-Anesthesia Assessment:                           - Prior to the procedure, a History and Physical                            was performed, and patient medications and                            allergies were reviewed. The patient's tolerance of                            previous anesthesia was also reviewed. The risks                            and benefits of the procedure and the sedation                            options and risks were discussed with the patient.                            All questions were answered, and informed consent                            was obtained. Prior Anticoagulants: The patient                            last took Eliquis (apixaban) 3 days prior to the                            procedure. ASA Grade Assessment: IV - A patient                            with severe systemic disease that is a constant                            threat to life. After reviewing the risks and  benefits, the patient was deemed in satisfactory                            condition to undergo the procedure.                           After obtaining informed consent, the endoscope was                            passed under direct vision. Throughout the                            procedure, the patient's blood pressure, pulse, and                            oxygen  saturations were monitored continuously. The                            [id number]) scope was introduced through the                            mouth, and advanced to the second part of duodenum.                            The upper GI endoscopy was accomplished without                            difficulty. The patient tolerated the procedure                            well. Scope In: 9:22:09 AM Scope Out: 9:27:05 AM Total Procedure Duration: 0 hours 4 minutes 56 seconds  Findings:      The examined esophagus was normal.      The Z-line was irregular and was found 39 cm from the incisors.      Bilious fluid was found in the gastric body.      The entire examined stomach was normal.      The duodenal bulb and second portion of the duodenum were normal. Impression:               - Normal esophagus.                           - Z-line irregular, 39 cm from the incisors.                           - Bilious gastric fluid.                           - Normal stomach.                           - Normal duodenal bulb and second portion of the                            duodenum.                           -  No specimens collected. Moderate Sedation:      Per Anesthesia Care Recommendation:           - Return patient to hospital ward for ongoing care.                           - Full liquid diet today.                           - Continue present medications.                           - H/h at noon.                           - Colonoscopy if patient agrees. Procedure Code(s):        --- Professional ---                           [phone], Esophagogastroduodenoscopy, flexible,                            transoral; diagnostic, including collection of                            specimen(s) by brushing or washing, when performed                            (separate procedure) Diagnosis Code(s):        --- Professional ---                           K22.8, Other specified diseases of  esophagus                           K92.1, Melena (includes Hematochezia) CPT copyright 2019 [employer]. All rights reserved. The codes documented in this report are preliminary and upon coder review may  be revised to meet current compliance requirements. Hildred Laser, MD Hildred Laser, MD 01/02/2019 9:39:35 AM This report has been signed electronically. Number of Addenda: 0

## 2019-01-02 NOTE — Progress Notes (Signed)
  Subjective:  Patient denies nausea vomiting or melena.  She says she has not had a bowel movement since she hadone since she had one on Friday evening.  She continues to complain of pain in the left side of her abdomen mainly left upper quadrant.  She feels a pulling sensation in this area. Objective: Blood pressure (!) 95/59, pulse (!) 58, temperature (!) 97.4 F (36.3 C), temperature source Oral, resp. rate 18, height '5\' 1"'$  (1.549 m), weight 56.2 kg, SpO2 100 %. Patient is alert and in no acute distress. Conjunctivae is pale.  Sclerae nonicteric. No neck masses or thyromegaly noted. Cardiac exam with regular rhythm normal S1 and S2.  No murmur gallop noted. Lungs are clear to auscultation. Abdomen is symmetrical.  She remains with mild tenderness in left upper quadrant and left midabdomen.  No organomegaly or masses.  Labs/studies Results:  CBC Latest Ref Rng & Units 01/02/2019 01/01/2019 12/31/2018  WBC 4.0 - 10.5 K/uL - 3.0(L) 4.9  Hemoglobin 12.0 - 15.0 g/dL 7.6(L) 8.0(L) 8.3(L)  Hematocrit 36.0 - 46.0 % 24.9(L) 25.2(L) 26.9(L)  Platelets 150 - 400 K/uL - 169 155    CMP Latest Ref Rng & Units 01/01/2019 12/31/2018 08/09/2018  Glucose 70 - 99 mg/dL 99 92 100(H)  BUN 8 - 23 mg/dL '21 14 16  '$ Creatinine 0.44 - 1.00 mg/dL 0.78 0.96 0.77  Sodium 135 - 145 mmol/L 138 141 134(L)  Potassium 3.5 - 5.1 mmol/L 3.8 4.2 3.9  Chloride 98 - 111 mmol/L 106 110 99  CO2 22 - 32 mmol/L '22 25 27  '$ Calcium 8.9 - 10.3 mg/dL 8.9 8.6(L) 8.4(L)  Total Protein 6.5 - 8.1 g/dL - 6.1(L) -  Total Bilirubin 0.3 - 1.2 mg/dL - 0.5 -  Alkaline Phos 38 - 126 U/L - 42 -  AST 15 - 41 U/L - 15 -  ALT 0 - 44 U/L - 9 -    Hepatic Function Latest Ref Rng & Units 12/31/2018 08/04/2018 07/31/2018  Total Protein 6.5 - 8.1 g/dL 6.1(L) 5.2(L) 7.0  Albumin 3.5 - 5.0 g/dL 3.2(L) 2.6(L) 3.7  AST 15 - 41 U/L '15 18 23  '$ ALT 0 - 44 U/L '9 19 25  '$ Alk Phosphatase 38 - 126 U/L 42 42 68  Total Bilirubin 0.3 - 1.2 mg/dL 0.5 0.4 0.3      Assessment:  #1.  Melena.  Hemoglobin has dropped by less than a gram since admission.  She does not appear to be actively bleeding.  She has not had a bowel movement in almost 2 days.  Peptic ulcer disease suspected given that she had been using ibuprofen in the background of anticoagulant.  #2.  Anemia secondary to GI bleed.  Anticoagulant on hold.  #3.  Neuromyelitis optica.  Patient is on IV Solu-Medrol.  #4.  Left-sided abdominal pain.  Suspect musculoskeletal pain.  CT on admission did not reveal any abnormality to account for this pain.   Recommendations:  Proceed with esophagogastroduodenoscopy under monitored anesthesia care. Check H&H later today.

## 2019-01-03 ENCOUNTER — Other Ambulatory Visit: Payer: Self-pay

## 2019-01-03 ENCOUNTER — Inpatient Hospital Stay (HOSPITAL_COMMUNITY): Payer: Medicare HMO | Admitting: Anesthesiology

## 2019-01-03 ENCOUNTER — Encounter (HOSPITAL_COMMUNITY): Payer: Self-pay

## 2019-01-03 ENCOUNTER — Encounter (HOSPITAL_COMMUNITY)
Admission: EM | Disposition: A | Discharge status: Home / Self Care | Payer: Self-pay | Source: Home / Self Care | Attending: Family Medicine

## 2019-01-03 DIAGNOSIS — D123 Benign neoplasm of transverse colon: Secondary | ICD-10-CM

## 2019-01-03 DIAGNOSIS — K5520 Angiodysplasia of colon without hemorrhage: Secondary | ICD-10-CM

## 2019-01-03 DIAGNOSIS — K644 Residual hemorrhoidal skin tags: Secondary | ICD-10-CM

## 2019-01-03 DIAGNOSIS — K625 Hemorrhage of anus and rectum: Secondary | ICD-10-CM

## 2019-01-03 DIAGNOSIS — K621 Rectal polyp: Secondary | ICD-10-CM

## 2019-01-03 DIAGNOSIS — K6389 Other specified diseases of intestine: Secondary | ICD-10-CM

## 2019-01-03 DIAGNOSIS — K5730 Diverticulosis of large intestine without perforation or abscess without bleeding: Secondary | ICD-10-CM

## 2019-01-03 HISTORY — PX: COLONOSCOPY WITH PROPOFOL: SHX5780

## 2019-01-03 HISTORY — PX: POLYPECTOMY: SHX5525

## 2019-01-03 LAB — GLUCOSE, CAPILLARY
Glucose-Capillary: 67 mg/dL — ABNORMAL LOW (ref 70–99)
Glucose-Capillary: 108 mg/dL — ABNORMAL HIGH (ref 70–99)
Glucose-Capillary: 69 mg/dL — ABNORMAL LOW (ref 70–99)
Glucose-Capillary: 137 mg/dL — ABNORMAL HIGH (ref 70–99)
Glucose-Capillary: 79 mg/dL (ref 70–99)

## 2019-01-03 LAB — URINE CULTURE
Culture: >=100000 — AB
Special Requests: NORMAL

## 2019-01-03 LAB — HEMOGLOBIN AND HEMATOCRIT, BLOOD
HCT: 24.9 % — ABNORMAL LOW (ref 36.0–46.0)
Hemoglobin: 7.7 g/dL — ABNORMAL LOW (ref 12.0–15.0)

## 2019-01-03 SURGERY — COLONOSCOPY WITH PROPOFOL
Anesthesia: General

## 2019-01-03 MED ORDER — CEPHALEXIN 500 MG PO CAPS: 500.0000 mg | ORAL_CAPSULE | Freq: Three times a day (TID) | ORAL | 0 refills | Status: AC

## 2019-01-03 MED ORDER — PROPOFOL 10 MG/ML IV BOLUS
INTRAVENOUS | Status: DC | PRN
  Administered 2019-01-03: 20 mg via INTRAVENOUS

## 2019-01-03 MED ORDER — APIXABAN 5 MG PO TABS: 5.0000 mg | ORAL_TABLET | Freq: Two times a day (BID) | ORAL | 1 refills | Status: DC

## 2019-01-03 MED ORDER — LIDOCAINE HCL (CARDIAC) PF 100 MG/5ML IV SOSY
PREFILLED_SYRINGE | INTRAVENOUS | Status: DC | PRN
  Administered 2019-01-03: 40 mg via INTRAVENOUS

## 2019-01-03 MED ORDER — PROMETHAZINE HCL 25 MG/ML IJ SOLN: 6.2500 mg | INTRAMUSCULAR | Status: DC | PRN

## 2019-01-03 MED ORDER — POLYETHYLENE GLYCOL 3350 17 G PO PACK: 17.0000 g | PACK | ORAL | 3 refills | Status: DC

## 2019-01-03 MED ORDER — KETAMINE HCL 50 MG/5ML IJ SOSY
PREFILLED_SYRINGE | INTRAMUSCULAR | Status: AC
  Filled 2019-01-03: qty 5

## 2019-01-03 MED ORDER — VITAMIN E 180 MG (400 UNIT) PO CAPS
800.0000 [IU] | ORAL_CAPSULE | Freq: Every day | ORAL | Status: DC
  Administered 2019-01-03: 800 [IU] via ORAL
  Filled 2019-01-03: qty 2

## 2019-01-03 MED ORDER — LACTATED RINGERS IV SOLN
INTRAVENOUS | Status: DC
  Administered 2019-01-03: 15:00:00 via INTRAVENOUS

## 2019-01-03 MED ORDER — LACTATED RINGERS IV SOLN
INTRAVENOUS | Status: DC | PRN
  Administered 2019-01-03: 16:00:00 via INTRAVENOUS

## 2019-01-03 MED ORDER — ALPRAZOLAM 0.5 MG PO TABS: 0.5000 mg | ORAL_TABLET | Freq: Three times a day (TID) | ORAL | Status: DC | PRN

## 2019-01-03 MED ORDER — OXYCODONE-ACETAMINOPHEN 10-325 MG PO TABS: 1.0000 | ORAL_TABLET | Freq: Four times a day (QID) | ORAL | 0 refills | Status: AC | PRN

## 2019-01-03 MED ORDER — DEXTROSE 50 % IV SOLN: 12.5000 g | INTRAVENOUS | Status: AC

## 2019-01-03 MED ORDER — PANTOPRAZOLE SODIUM 40 MG PO TBEC: 40.0000 mg | DELAYED_RELEASE_TABLET | Freq: Two times a day (BID) | ORAL | 1 refills | Status: AC

## 2019-01-03 MED ORDER — MAGNESIUM CITRATE PO SOLN
0.5000 | Freq: Once | ORAL | Status: AC
  Administered 2019-01-03: 0.5 via ORAL
  Filled 2019-01-03: qty 296

## 2019-01-03 MED ORDER — SODIUM CHLORIDE 0.9 % IV SOLN: INTRAVENOUS | Status: DC

## 2019-01-03 MED ORDER — SENNOSIDES-DOCUSATE SODIUM 8.6-50 MG PO TABS: 2.0000 | ORAL_TABLET | Freq: Every day | ORAL | 3 refills | Status: AC

## 2019-01-03 MED ORDER — DEXTROSE 50 % IV SOLN
INTRAVENOUS | Status: AC
  Administered 2019-01-03: 25 mL
  Filled 2019-01-03: qty 50

## 2019-01-03 MED ORDER — ALPRAZOLAM 0.5 MG PO TABS: 0.5000 mg | ORAL_TABLET | Freq: Three times a day (TID) | ORAL | 0 refills | Status: AC | PRN

## 2019-01-03 MED ORDER — ONDANSETRON HCL 4 MG PO TABS: 4.0000 mg | ORAL_TABLET | Freq: Four times a day (QID) | ORAL | 0 refills | Status: AC | PRN

## 2019-01-03 MED ORDER — PROPOFOL 500 MG/50ML IV EMUL
INTRAVENOUS | Status: DC | PRN
  Administered 2019-01-03: 150 ug/kg/min via INTRAVENOUS

## 2019-01-03 MED ORDER — GLYCOPYRROLATE 0.2 MG/ML IJ SOLN
INTRAMUSCULAR | Status: DC | PRN
  Administered 2019-01-03: 0.2 mg via INTRAVENOUS

## 2019-01-03 NOTE — Anesthesia Preprocedure Evaluation (Addendum)
Anesthesia Evaluation  Patient identified by MRN, date of birth, ID band Patient awake  General Assessment Comment:States has multiple drug allergies  States NO morphine   Reviewed: Allergy & Precautions, NPO status , Patient's Chart, lab work & pertinent test results  History of Anesthesia Complications (+) history of anesthetic complications  Airway Mallampati: II  TM Distance: >3 FB Neck ROM: Full    Dental no notable dental hx. (+) Teeth Intact   Pulmonary neg pulmonary ROS, Current Smoker,    Pulmonary exam normal breath sounds clear to auscultation       Cardiovascular Exercise Tolerance: Poor + angina with exertion Normal cardiovascular examII Rhythm:Regular Rate:Normal  Uses wheel chair  Denies any known cardiac issues    Neuro/Psych  Headaches, Anxiety Depression Has L hemiparesis -not certain why -states never had a CVA that she is aware of   Neuromuscular disease negative psych ROS   GI/Hepatic Neg liver ROS, GERD  Medicated and Controlled,  Endo/Other  Hypothyroidism   Renal/GU Renal InsufficiencyRenal disease  negative genitourinary   Musculoskeletal  (+) Arthritis , Osteoarthritis,  Fibromyalgia -  Abdominal   Peds negative pediatric ROS (+)  Hematology negative hematology ROS (+) anemia , Severe anemia  HCT stable , had EGD yesterday by Dr. Laural Golden Here for Coloscopy today  Hasn't received blood, will hold off on transfusion for now   Anesthesia Other Findings   Reproductive/Obstetrics negative OB ROS                            Anesthesia Physical Anesthesia Plan  ASA: IV  Anesthesia Plan: General   Post-op Pain Management:    Induction: Intravenous  PONV Risk Score and Plan: 2 and Propofol infusion, TIVA and Treatment may vary due to age or medical condition  Airway Management Planned: Nasal Cannula and Simple Face Mask  Additional Equipment:   Intra-op Plan:    Post-operative Plan:   Informed Consent: I have reviewed the patients History and Physical, chart, labs and discussed the procedure including the risks, benefits and alternatives for the proposed anesthesia with the patient or authorized representative who has indicated his/her understanding and acceptance.     Dental advisory given  Plan Discussed with: CRNA  Anesthesia Plan Comments: (Plan Full PPE use  Plan GA with GETA as needed d/w pt -WTP with same after Q&A)        Anesthesia Quick Evaluation

## 2019-01-03 NOTE — Progress Notes (Signed)
CRITICAL VALUE ALERT  Critical Value:  41  Date & Time Notied:  [id number]  Provider Notified: Dr. Denton Brick, [place]  Orders Received/Actions taken: yes

## 2019-01-03 NOTE — Transfer of Care (Signed)
Immediate Anesthesia Transfer of Care Note  Patient: Susan Hodge  Procedure(s) Performed: COLONOSCOPY WITH PROPOFOL (N/A ) POLYPECTOMY  Patient Location: PACU  Anesthesia Type:General  Level of Consciousness: awake, alert , oriented and patient cooperative  Airway & Oxygen Therapy: Patient Spontanous Breathing  Post-op Assessment: Report given to RN and Post -op Vital signs reviewed and stable  Post vital signs: Reviewed and stable  Last Vitals:  Vitals Value Taken Time  BP 115/64 01/03/19 1640  Temp    Pulse 62 01/03/19 1643  Resp 18 01/03/19 1643  SpO2 100 % 01/03/19 1643  Vitals shown include unvalidated device data.  Last Pain:  Vitals:   01/03/19 1451  TempSrc: Oral  PainSc: 5       Patients Stated Pain Goal: 2 ([id number])  Complications: No apparent anesthesia complications

## 2019-01-03 NOTE — Op Note (Signed)
[facility] Patient Name: Susan Hodge Procedure Date: 01/03/2019 3:35 PM MRN: [id number] Date of Birth: December 13, 1950 Attending MD: Hildred Laser , MD CSN: [id number] Age: 68 Admit Type: Inpatient Procedure:                Colonoscopy Indications:              Rectal bleeding Providers:                Hildred Laser, MD, Gwynneth Albright RN, RN,                            Randa Spike, Technician Referring MD:             Roxan Hockey, MD Medicines:                Propofol per Anesthesia Complications:            No immediate complications. Estimated Blood Loss:     Estimated blood loss was minimal. Procedure:                Pre-Anesthesia Assessment:                           - Prior to the procedure, a History and Physical                            was performed, and patient medications and                            allergies were reviewed. The patient's tolerance of                            previous anesthesia was also reviewed. The risks                            and benefits of the procedure and the sedation                            options and risks were discussed with the patient.                            All questions were answered, and informed consent                            was obtained. Prior Anticoagulants: The patient                            last took Eliquis (apixaban) 4 days prior to the                            procedure. ASA Grade Assessment: IV - A patient                            with severe systemic disease that is a constant  threat to life. After reviewing the risks and                            benefits, the patient was deemed in satisfactory                            condition to undergo the procedure.                           After obtaining informed consent, the colonoscope                            was passed under direct vision. Throughout the                            procedure, the patient's  blood pressure, pulse, and                            oxygen saturations were monitored continuously. The                            [id number] scope was introduced through                            the anus and advanced to the the cecum, identified                            by appendiceal orifice and ileocecal valve. The                            colonoscopy was performed without difficulty. The                            patient tolerated the procedure well. The quality                            of the bowel preparation was excellent. The                            ileocecal valve, appendiceal orifice, and rectum                            were photographed. Scope In: 4:02:53 PM Scope Out: 4:34:53 PM Scope Withdrawal Time: 0 hours 22 minutes 9 seconds  Total Procedure Duration: 0 hours 32 minutes 0 seconds  Findings:      Skin tags were found on perianal exam.      The digital rectal exam was normal.      A diffuse area of mild melanosis was found in the entire colon.      Three medium-sized angiodysplastic lesions without bleeding were found       in the cecum. Coagulation for bleeding prevention using argon plasma at       0.3 liters/minute and 15 watts was successful.      A single small and large-mouthed diverticulum was found in  the proximal       ascending colon.      A small polyp was found in the mid transverse colon. The polyp was       removed with a cold snare. Resection and retrieval were complete. The       pathology specimen was placed into Bottle Number 1.      A 7 mm polyp was found in the rectum. The polyp was semi-sessile. The       polyp was removed with a cold snare. Resection and retrieval were       complete. The pathology specimen was placed into Bottle Number 2.      External hemorrhoids were found during retroflexion. The hemorrhoids       were small. Impression:               - Perianal skin tags found on perianal exam.                            - Melanosis in the colon.                           - Three non-bleeding colonic angiodysplastic                            lesions. Treated with argon plasma coagulation                            (APC).                           - Diverticulosis in the proximal ascending colon.                           - One small polyp in the mid transverse colon,                            removed with a cold snare. Resected and retrieved.                           - One 7 mm polyp in the rectum, removed with a cold                            snare. Resected and retrieved.                           - External hemorrhoids.                           Comment: diverticular bleed vs bleed due to AVMs. Moderate Sedation:      Per Anesthesia Care Recommendation:           - Return patient to hospital ward for ongoing care.                           - Cardiac diet today.                           -  Continue present medications.                           - Resume Eliquis (apixaban) at prior dose in 4 days.                           - Await pathology results.                           - Repeat colonoscopy is recommended. The                            colonoscopy date will be determined after pathology                            results from today's exam become available for                            review. Procedure Code(s):        --- Professional ---                           [phone], 59, Colonoscopy, flexible; with control of                            bleeding, any method                           45385, Colonoscopy, flexible; with removal of                            tumor(s), polyp(s), or other lesion(s) by snare                            technique Diagnosis Code(s):        --- Professional ---                           K63.89, Other specified diseases of intestine                           K55.20, Angiodysplasia of colon without hemorrhage                           K64.4, Residual hemorrhoidal  skin tags                           K63.5, Polyp of colon                           K62.1, Rectal polyp                           K62.5, Hemorrhage of anus and rectum                           K57.30, Diverticulosis of large intestine without  perforation or abscess without bleeding CPT copyright 2019 [employer]. All rights reserved. The codes documented in this report are preliminary and upon coder review may  be revised to meet current compliance requirements. Hildred Laser, MD Hildred Laser, MD 01/03/2019 5:02:37 PM This report has been signed electronically. Number of Addenda: 0

## 2019-01-03 NOTE — Care Management Important Message (Signed)
Important Message  Patient Details  Name: Susan Hodge MRN: [id number] Date of Birth: 01-24-1951   Medicare Important Message Given:        Tommy Medal 01/03/2019, 2:25 PM

## 2019-01-03 NOTE — Discharge Summary (Signed)
Susan Hodge, is a 68 y.o. female  DOB [birth year]  MRN [id number].  Admission date:  12/31/2018  Admitting Physician  Roxan Hockey, MD  Discharge Date:  01/03/2019   Primary MD  [name], NP  Recommendations for primary care physician for things to follow:    1)Hold Eliquis/Apixaban until Saturday 01/08/2019---- 2)Avoid ibuprofen/Advil/Aleve/Motrin/Goody Powders/Naproxen/BC powders/Meloxicam/Diclofenac/Indomethacin and other Nonsteroidal anti-inflammatory medications as these will make you more likely to bleed and can cause stomach ulcers, can also cause Kidney problems.  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   Depression    DJD (degenerative joint disease)    FH: CVA (cerebrovascular accident)    father   Fibromyalgia    GERD (gastroesophageal reflux disease)    Hypothyroidism    Lupus ([facility])    Migraines    Palpitations    Status post cholecystectomy    appendectomy and bilaateral oophorectomy   Tobacco use     Past Surgical History:  Procedure Laterality Date   BILATERAL OOPHORECTOMY     CARDIAC CATHETERIZATION     CHOLECYSTECTOMY        HPI  from the history and  physical done on the day of admission:    Susan Hodge  is a 68 y.o. female with past medical history remarkable for in the move with chronic left lower extremity weakness presents with 1 week of  Worsening left lower extremity weakness, generalized weakness, nausea and vomiting and left flank discomfort/abdominal discomfort for the last 3 days -She feels like she has to have a bowel movement... She did have a BM that appeared darker than normal for her today  EDP was concerned about NMO flareup, telemetry neurologist advised treatment with IV Solu-Medrol 1 g daily for 5 days telemetry neurologist also advised--MRI brain C-spine T-spine and L-spine - - denies hematemesis, hematochezia  ?? Possible  Melena today  Admits to Ibuprofen use for Headaches  Last dose of Eliquis was 8pm on 12/30/2018   --- LFTs not elevated,, creatinine 0.9, lipase is 20 -UA suggestive of UTI with nitrite, leukocyte esterase, WBC and bacteria present -WBC is 4.9   H&H of 8.3 and 26.9,--patient baseline hemoglobin is usually around 12...  -?? Possible  Melena today in the setting of ibuprofen and Eliquis use Stool occult blood pending     Hospital  Course:   Brief Summary:- 68 y.o.femalewith past medical history remarkable for NMO with chronic left lower extremity weakness presents with 1 week of Worsening left lower extremity weakness,generalized weakness, abdominal pain, nausea vomiting and melena and found to have hemoglobin drop of almost 4 g from baseline with heme positive stools -Colonoscopic findings suggest diverticular bleed vs bleed due to AVMs.    A/p  1)Acute Symptomatic Anemia---suspect due to acute blood loss anemia due to GI bleedin the setting of concomitant Eliquis and ibuprofen use patient baseline hemoglobin usually around 12 , Hgb is now 7.7 -Colonoscopic findings on 01/03/2019 suggest diverticular bleed vs bleed due to AVMs.  -Continue to hold Eliquis, may restart Eliquis  on 01/08/2019 if no further bleeding.... Consider reducing Eliquis to 2.5 mg twice daily if further concerns about bleeding  -Last dose of Eliquis was 8pm on 12/30/2018 --GI consult appreciated -EGD on 01/02/2019 largely unremarkable   2)Pancreatic Mass/Cyst---CT abdomen and pelvis showed interval enlargement of low-density lesion within the pancreatic uncinate process (currently 1.9 x 1.0 cm, previously 1.6 x 0.9 cm on 08/04/2013).  However upon further review -  She had endoscopic ultrasound back in August 2014 at [facility] benign cyst.  Current CT reveals cyst dimension to be 19 x 10 mm. This is actually measured to be slightly larger on MRI of October 2015.  -GI recommends no further work-up at this time  3)Left lower Extremity DVT/chronic Anticoagulation--- patient was diagnosed with DVT on 08/01/2018,  -Continue to hold Eliquis, may restart Eliquis on 01/08/2019 if no further bleeding.... Consider reducing Eliquis to 2.5 mg twice daily if further concerns about bleeding  -Last dose of Eliquis was 8pm on 12/30/2018 --Please note that patient has persistent left lower extremity weakness with chronic edema of the left lower extremity, less mobility of the left lower extremity, patient is at risk for recurrent DVT if anticoagulation is discontinued  4)Klebsiella UTI--treated with IV Rocephin , okay to discharge on p.o. Keflex  5)PossibleNMOFlare up--as per Tele-neurologistsuspect flareup of neuromyelitis,telemetry neurologist advised IV Solu-Medrol 1 g daily for up to 5 days -neurologist also advised--MRI brain C-spine T-spine and L-spine -Discussed with Dr. Merlene Laughter thelocal neurologist who would be available to evaluate patient on 01/03/2019 if needed ---PTA patient was on IVSolirisevery 2 weeks --MRI brain and MRI lumbar spine without new acute findings -MRI cervical and thoracic spine with mixed appearance / findings--may be related to lupus or NMO -Clinically low index  of suspicion for significant NMO flareup at this time -Hold off on high-dose IV steroids given acute GI bleed  Code Status : Full  Family Communication:   (patient is alert, awake and coherent) Discussed with pt's son Abe People prior to discharge, discharge instructions reviewed, questions answered  Disposition Plan  : home  Consults  :  Gi/Gensurgery  Procedures- EGD on 01/02/2019 largely unremarkable -For colonoscopy  on 01/03/2019  -Colonoscopy on 01/03/2019 Impression:               - Perianal skin tags found on perianal exam.                           - Melanosis in the colon.                           - Three non-bleeding colonic angiodysplastic  lesions. Treated with argon plasma coagulation                            (APC).                           - Diverticulosis in the proximal ascending colon.                           - One small polyp in the mid transverse colon,                            removed with a cold snare. Resected,retrieved.                           - One 7 mm polyp in the rectum, removed with a cold snare. Resected and retrieved.                            - External hemorrhoids.                           Comment: diverticular bleed vs bleed due to AVMs.   Discharge Condition: stable  Follow UP --PCP for repeat CBC on 01/07/2019  Diet and Activity recommendation:  As advised  Discharge Instructions    Discharge Instructions    Call MD for:  difficulty breathing, headache or visual disturbances   Complete by: As directed    Call MD for:  persistant dizziness or light-headedness   Complete by: As directed    Call MD for:  persistant nausea and vomiting   Complete by: As directed    Call MD for:  temperature >100.4   Complete by: As directed    Diet - low sodium heart healthy   Complete by: As directed    Discharge instructions   Complete by: As directed    1)Hold Eliquis/Apixaban until Saturday 01/08/2019---- 2)Avoid  ibuprofen/Advil/Aleve/Motrin/Goody Powders/Naproxen/BC powders/Meloxicam/Diclofenac/Indomethacin and other Nonsteroidal anti-inflammatory medications as these will make you more likely to bleed and can cause stomach ulcers, can also cause Kidney problems.  3)Call if any concerns about bleeding or dark stools 4) avoid constipation 5) take cephalexin/Keflex antibiotics as prescribed for Klebsiella urinary tract infection 6) follow-up with your primary care physician around Friday, 01/07/2019 for repeat CBC/complete blood count test   Increase activity slowly   Complete by: As directed         Discharge Medications     Allergies as of 01/03/2019      Reactions   Amitriptyline Other (See Comments)   Bee Venom Other (See Comments)   Fexofenadine Other (See Comments)   Furosemide Other (See Comments)   Gabapentin Other (See Comments)   Hydroxychloroquine Sulfate Other (See Comments)   Morphine Other (See Comments)   Nsaids    Oxycodone Other (See Comments)   dizzy   Topiramate Other (See Comments)      Medication List    STOP taking these medications   amoxicillin 500 MG capsule Commonly known as: AMOXIL   ibuprofen 400 MG tablet Commonly known as: ADVIL   predniSONE 20 MG tablet Commonly known as: DELTASONE     TAKE these medications   acetaminophen 325 MG tablet Commonly known as:  TYLENOL Take 2 tablets (650 mg total) by mouth every 6 (six) hours as needed for mild pain (or Fever >/= 101).   ALPRAZolam 0.5 MG tablet Commonly known as: XANAX Take 1 tablet (0.5 mg total) by mouth 3 (three) times daily as needed for anxiety or sleep.   apixaban 5 MG Tabs tablet Commonly known as: ELIQUIS Take 1 tablet (5 mg total) by mouth 2 (two) times daily. Hold until Saturday 01/08/2019 Start taking on: January 08, 2019 What changed:   additional instructions  These instructions start on January 08, 2019. If you are unsure what to do until then, ask your doctor or other care  provider.   baclofen 10 MG tablet Commonly known as: LIORESAL Take 1 tablet (10 mg total) by mouth 3 (three) times daily.   cephALEXin 500 MG capsule Commonly known as: Keflex Take 1 capsule (500 mg total) by mouth 3 (three) times daily for 3 days.   darifenacin 15 MG 24 hr tablet Commonly known as: ENABLEX Take 1 tablet (15 mg total) by mouth daily.   lidocaine 5 % Commonly known as: LIDODERM Place 1 patch onto the skin every 12 (twelve) hours.   loratadine 10 MG tablet Commonly known as: CLARITIN Take 10 mg by mouth daily.   ondansetron 4 MG tablet Commonly known as: ZOFRAN Take 1 tablet (4 mg total) by mouth every 6 (six) hours as needed for nausea.   oxyCODONE-acetaminophen 10-325 MG tablet Commonly known as: PERCOCET Take 1 tablet by mouth every 6 (six) hours as needed.   pantoprazole 40 MG tablet Commonly known as: PROTONIX Take 1 tablet (40 mg total) by mouth 2 (two) times daily before a meal. What changed: when to take this   polyethylene glycol 17 g packet Commonly known as: MiraLax Take 17 g by mouth every morning.   pregabalin 50 MG capsule Commonly known as: LYRICA Take 1 capsule (50 mg total) by mouth 3 (three) times daily. What changed: when to take this   senna-docusate 8.6-50 MG tablet Commonly known as: Senokot-S Take 2 tablets by mouth at bedtime.   SOLIRIS IV Inject into the vein See admin instructions. 900mg  IV for 4 weeks, 1200mg  at week 5 and then 1200mg  q 2 weeks   valproic acid 250 MG capsule Commonly known as: DEPAKENE Take 1 capsule (250 mg total) by mouth at bedtime.   VITAMIN B-12 PO Take 2 tablets by mouth daily.   Vitamin D (Ergocalciferol) 1.25 MG (50000 UT) Caps capsule Commonly known as: DRISDOL Take 1 capsule (50,000 Units total) by mouth every Monday.   vitamin E 1000 UNIT capsule Take 1,000 Units by mouth daily.       Major procedures and Radiology Reports - PLEASE review detailed and final reports for all  details, in brief -   Mr Jeri Cos Wo Contrast  Result Date: 12/31/2018 CLINICAL DATA:  68 year old female with left flank pain, weakness, nausea, vomiting and jaundice. EXAM: MRI HEAD WITHOUT AND WITH CONTRAST TECHNIQUE: Multiplanar, multiecho pulse sequences of the brain and surrounding structures were obtained without and with intravenous contrast. CONTRAST:  6 milliliters Gadavist in conjunction with contrast enhanced imaging of the total spine reported separately. COMPARISON:  Brain MRI 07/31/2018. Total spine MRI today reported separately. CT Abdomen and Pelvis earlier today. FINDINGS: Brain: Chronic encephalomalacia in the anterior right MCA territory centered at the frontal operculum hree identified. No restricted diffusion to suggest acute infarction. No midline shift, mass effect, evidence of mass lesion, ventriculomegaly, extra-axial collection or  acute intracranial hemorrhage. Cervicomedullary junction and pituitary are within normal limits. Elsewhere mild scattered bilateral cerebral white matter T2 and FLAIR hyperintensity. Although subtle Wallerian degeneration is suspected in the brainstem as seen on series 7, image 8, stable. No chronic blood products identified. No abnormal enhancement identified.  No dural thickening. Vascular: Major intracranial vascular flow voids Are stable. Major dural venous sinuses are enhancing and appear to be patent. Skull and upper cervical spine: Cervical spine is reported separately today. Visualized bone marrow signal is within normal limits. Sinuses/Orbits: Negative orbits.  Paranasal sinuses are clear today. Other: Mastoids remain well pneumatized. Visible internal auditory structures appear normal. Nodular appearance of the bilateral parotid glands is re-demonstrated and stable. The visible submandibular glands seem to be spared. IMPRESSION: 1.  No acute intracranial abnormality. 2. Stable MRI appearance of the brain since March with chronic anterior division  right MCA infarct and subtle Wallerian degeneration. 3. Total Spine MRI today reported separately. 4. Chronic nodular appearance of the parotid gland suggesting chronic/recurrent salivary gland inflammation, such as Sjogren syndrome. Electronically Signed   By: Genevie Ann M.D.   On: 12/31/2018 19:02   Mr Cervical Spine W Or Wo Contrast  Result Date: 12/31/2018 CLINICAL DATA:  68 year old female with left flank pain, weakness, nausea, vomiting and jaundice. History of multifocal abnormal cervical and thoracic spinal cord lesions with enhancement in March of this year. The patient carries a diagnosis of collagen vascular disease and lupus. EXAM: MRI CERVICAL AND THORACIC SPINE WITHOUT AND WITH CONTRAST TECHNIQUE: Multiplanar and multiecho pulse sequences of the cervical and thoracic spine were obtained without and with intravenous contrast. CONTRAST:  6 milliliters Gadavist in conjunction with contrast enhanced imaging of the brain and lumbar spine reported separately. COMPARISON:  Cervical and thoracic MRI 07/31/2018. FINDINGS: MRI CERVICAL SPINE FINDINGS Alignment: Improved cervical lordosis from the prior. Vertebrae: No marrow edema or evidence of acute osseous abnormality. Visualized bone marrow signal is within normal limits. Cord: Progressive T2 and STIR hyperintensity in the cervical spinal cord at C2-C3 since March. Previously abnormal upper cord signal was centered at C3. See series 4, image 8 today. The cord also appears mildly expanded there. Persistent patchy T2 and STIR hyperintensity at the C3 level which is in the left hemi cord as seen on series 5, image 6. However, the abnormal cord signal which was central and to the right at C7 has regressed or largely resolved. Little if any cord myelomalacia is evident there. Furthermore, although postcontrast detail is suboptimal compared to the prior study no abnormal cord enhancement is identified today. No dural thickening. Posterior Fossa, vertebral  arteries, paraspinal tissues: Cervicomedullary junction is within normal limits. Brain findings today are reported separately. Preserved major vascular flow voids in the neck. Negative neck soft tissues. Disc levels: Cervical spine degeneration appears stable since March. There is multilevel mild degenerative cervical spinal stenosis from C3-C4 through C6-C7 related to disc bulging and ligament flavum hypertrophy. There is minimal to mild spinal cord mass effect. MRI THORACIC SPINE FINDINGS Segmentation:  Appears to be normal. Alignment:  Preserved thoracic kyphosis. Vertebrae: No marrow edema or evidence of acute osseous abnormality. Visualized bone marrow signal is within normal limits. Spinal cord: The widespread abnormal upper and midthoracic spinal cord T2 and FLAIR hyperintensity has regressed, and the cord now appears less expanded at those levels. No definite cord myelomalacia. However, there remains significant abnormal STIR signal throughout the thoracic cord which is now confluent through the midthoracic levels which previously were spared in  March. See series 6, image 9). Cord volume here appears diminished. On axial images the cord signal seems to be in the left greater than right central gray matter as seen on series 7, image 21. Central cord involvement is noted in the lower thoracic spine seen at T10 on series 7, image 28. The conus medullaris seems to be spared as before. No contrast was administered in the thoracic spine previously, but there is no abnormal intradural enhancement or dural thickening today. Paraspinal and other soft tissues: Negative visible thoracic and upper abdominal viscera, trace fluid in nondilated esophagus. Disc levels: Mild for age thoracic disc and endplate degeneration. No thoracic spinal stenosis. IMPRESSION: 1. Mixed appearance of the cervical and thoracic spinal cord lesions since March: - new/progressed cord lesion at the C2-C3 level. - regressed lower cervical cord  signal abnormality. - progressed in near complete thoracic cord STIR signal abnormality now, although the cord appears less expanded and perhaps with a degree of myelomalacia in the mid thoracic cord. 2. The conus medullaris seems to remain spared. 3. Today there is no abnormal cervical or thoracic cord enhancement. 4. Given the loss of both enhancement and cord expansion metastatic or neoplastic etiologies seem excluded. This seems to be an inflammatory process with a lack of involvement in the brain. Top differential considerations include: Lupus, Sjogren syndrome (note parotid abnormality on brain MRIs), Behcet disease, Neurosarcoidosis. Less likely considerations include multiple sclerosis, other autoimmune etiologies, and spinal cord ischemia. 5. Superimposed cervical spine degeneration with multilevel mild spinal stenosis which appears stable since March. 6. Thoracic disc and endplate degeneration without spinal stenosis. Electronically Signed   By: Genevie Ann M.D.   On: 12/31/2018 19:24   Mr Thoracic Spine W Wo Contrast  Result Date: 12/31/2018 CLINICAL DATA:  68 year old female with left flank pain, weakness, nausea, vomiting and jaundice. History of multifocal abnormal cervical and thoracic spinal cord lesions with enhancement in March of this year. The patient carries a diagnosis of collagen vascular disease and lupus. EXAM: MRI CERVICAL AND THORACIC SPINE WITHOUT AND WITH CONTRAST TECHNIQUE: Multiplanar and multiecho pulse sequences of the cervical and thoracic spine were obtained without and with intravenous contrast. CONTRAST:  6 milliliters Gadavist in conjunction with contrast enhanced imaging of the brain and lumbar spine reported separately. COMPARISON:  Cervical and thoracic MRI 07/31/2018. FINDINGS: MRI CERVICAL SPINE FINDINGS Alignment: Improved cervical lordosis from the prior. Vertebrae: No marrow edema or evidence of acute osseous abnormality. Visualized bone marrow signal is within normal  limits. Cord: Progressive T2 and STIR hyperintensity in the cervical spinal cord at C2-C3 since March. Previously abnormal upper cord signal was centered at C3. See series 4, image 8 today. The cord also appears mildly expanded there. Persistent patchy T2 and STIR hyperintensity at the C3 level which is in the left hemi cord as seen on series 5, image 6. However, the abnormal cord signal which was central and to the right at C7 has regressed or largely resolved. Little if any cord myelomalacia is evident there. Furthermore, although postcontrast detail is suboptimal compared to the prior study no abnormal cord enhancement is identified today. No dural thickening. Posterior Fossa, vertebral arteries, paraspinal tissues: Cervicomedullary junction is within normal limits. Brain findings today are reported separately. Preserved major vascular flow voids in the neck. Negative neck soft tissues. Disc levels: Cervical spine degeneration appears stable since March. There is multilevel mild degenerative cervical spinal stenosis from C3-C4 through C6-C7 related to disc bulging and ligament flavum hypertrophy. There is  minimal to mild spinal cord mass effect. MRI THORACIC SPINE FINDINGS Segmentation:  Appears to be normal. Alignment:  Preserved thoracic kyphosis. Vertebrae: No marrow edema or evidence of acute osseous abnormality. Visualized bone marrow signal is within normal limits. Spinal cord: The widespread abnormal upper and midthoracic spinal cord T2 and FLAIR hyperintensity has regressed, and the cord now appears less expanded at those levels. No definite cord myelomalacia. However, there remains significant abnormal STIR signal throughout the thoracic cord which is now confluent through the midthoracic levels which previously were spared in March. See series 6, image 9). Cord volume here appears diminished. On axial images the cord signal seems to be in the left greater than right central gray matter as seen on series  7, image 21. Central cord involvement is noted in the lower thoracic spine seen at T10 on series 7, image 28. The conus medullaris seems to be spared as before. No contrast was administered in the thoracic spine previously, but there is no abnormal intradural enhancement or dural thickening today. Paraspinal and other soft tissues: Negative visible thoracic and upper abdominal viscera, trace fluid in nondilated esophagus. Disc levels: Mild for age thoracic disc and endplate degeneration. No thoracic spinal stenosis. IMPRESSION: 1. Mixed appearance of the cervical and thoracic spinal cord lesions since March: - new/progressed cord lesion at the C2-C3 level. - regressed lower cervical cord signal abnormality. - progressed in near complete thoracic cord STIR signal abnormality now, although the cord appears less expanded and perhaps with a degree of myelomalacia in the mid thoracic cord. 2. The conus medullaris seems to remain spared. 3. Today there is no abnormal cervical or thoracic cord enhancement. 4. Given the loss of both enhancement and cord expansion metastatic or neoplastic etiologies seem excluded. This seems to be an inflammatory process with a lack of involvement in the brain. Top differential considerations include: Lupus, Sjogren syndrome (note parotid abnormality on brain MRIs), Behcet disease, Neurosarcoidosis. Less likely considerations include multiple sclerosis, other autoimmune etiologies, and spinal cord ischemia. 5. Superimposed cervical spine degeneration with multilevel mild spinal stenosis which appears stable since March. 6. Thoracic disc and endplate degeneration without spinal stenosis. Electronically Signed   By: Genevie Ann M.D.   On: 12/31/2018 19:24   Mr Lumbar Spine W Wo Contrast  Result Date: 12/31/2018 CLINICAL DATA:  68 year old female with left flank pain, weakness, nausea, vomiting and jaundice. History of abnormal cervical and thoracic spinal cord diagnosed in March. EXAM: MRI  LUMBAR SPINE WITHOUT AND WITH CONTRAST TECHNIQUE: Multiplanar and multiecho pulse sequences of the lumbar spine were obtained without and with intravenous contrast. CONTRAST:  6 milliliters Gadavist in conjunction with contrast enhanced imaging of the brain and spine reported separately. COMPARISON:  Thoracic spine MRI today reported separately. CT Abdomen and Pelvis earlier today. Lumbar MRI 07/26/2018. FINDINGS: Segmentation: Normal, the same numbering system used on the thoracic MRI today. Alignment:  Stable lumbar lordosis since March. Vertebrae: No marrow edema or evidence of acute osseous abnormality. Visualized bone marrow signal is within normal limits. There is a small chronic inferior T11 endplate Schmorl's node. Intact visible sacrum and SI joints. Conus medullaris and cauda equina: Abnormal STIR and T2 hyperintensity in the thoracic spinal cord abates at the T10 T11 level. From T11 to the conus at L1 the lower cord appears to be normal. Unremarkable cauda quinine nerve roots aside from degenerative stenosis in the lower lumbar spine. No abnormal intradural enhancement or dural thickening. Paraspinal and other soft tissues: Negative. Disc levels:  Mild lumbar degeneration without stenosis above L3-L4. L3-L4: Left eccentric disc bulge and moderate facet hypertrophy. Mild spinal stenosis and left lateral recess stenosis. Mild left L3 foraminal stenosis. This level is stable. L4-L5: Circumferential disc bulge with broad-based posterior component and central annular fissure of the disc (series 6, image 8). Moderate posterior element hypertrophy. Increased spinal stenosis at this level since March, now moderate to severe on series 5, image 24. Mild to moderate bilateral lateral recess stenosis. Mild bilateral L4 foraminal stenosis is stable. L5-S1: Increased epidural lipomatosis at this level since March which effaces CSF from the thecal sac. Stable mild to moderate facet hypertrophy and mild foraminal disc  bulging and endplate spurring. Mild mostly left side L5 foraminal stenosis is stable. IMPRESSION: 1. Normal appearance of the lower thoracic spinal cord from T11 through the conus at L1. Normal cauda equina aside from degenerative stenosis in the lower lumbar spine. See also cervical and thoracic spine MRI today reported separately. 2. L3-L4 through L5-S1 lumbar spine degeneration with superimposed increased epidural lipomatosis since March. - subsequent increased and now Moderate to Severe L4-L5 spinal stenosis. - stable mild spinal and left lateral recess stenosis at L3-L4. Electronically Signed   By: Genevie Ann M.D.   On: 12/31/2018 19:53   Ct Abdomen Pelvis W Contrast  Result Date: 12/31/2018 CLINICAL DATA:  Left flank pain EXAM: CT ABDOMEN AND PELVIS WITH CONTRAST TECHNIQUE: Multidetector CT imaging of the abdomen and pelvis was performed using the standard protocol following bolus administration of intravenous contrast. CONTRAST:  73mL OMNIPAQUE IOHEXOL 300 MG/ML  SOLN COMPARISON:  CT 08/04/2013, 11/30/2012 FINDINGS: Lower chest: Bilateral pulmonary nodules including 5 mm posterior right lower lobe pulmonary nodule (series 4, image 27) and 6 mm juxtapleural left lower lobe pulmonary nodule (series 4, image 36). No significant interval change in size compared to prior. Hepatobiliary: No focal liver abnormality is seen. Status post cholecystectomy. No biliary dilatation. Pancreas: Slight interval enlargement of low-density lesion within the uncinate process of the pancreas (series 2, image 32) now measuring 1.9 x 1.0 cm, previously 1.6 x 0.9 cm. Internal density of this lesion measures 22 Hounsfield units, slightly greater than fluid density. The remainder of the pancreas is otherwise unremarkable without surrounding inflammatory changes. Spleen: Normal in size without focal abnormality. Adrenals/Urinary Tract: Adrenal glands are unremarkable. Kidneys are normal, without renal calculi, focal lesion, or  hydronephrosis. Ureters nondilated. Bladder is unremarkable. Stomach/Bowel: Stomach is within normal limits. Appendix not clearly identified. Scattered colonic diverticulosis. No evidence of bowel wall thickening, distention, or inflammatory changes. Vascular/Lymphatic: Aortic atherosclerosis. No enlarged abdominal or pelvic lymph nodes. Reproductive: Retroverted uterus containing a 2.9 cm low-density lesion in the fundal segment, unchanged from 11/30/2012, likely fibroid. Other: No abdominal wall hernia or abnormality. No abdominopelvic ascites. Musculoskeletal: No acute or significant osseous findings. IMPRESSION: 1. No acute abdominopelvic findings. 2. Slight interval enlargement of low-density lesion within the pancreatic uncinate process (currently 1.9 x 1.0 cm, previously 1.6 x 0.9 cm on 08/04/2013). Given the slight interval growth, surgical consultation is recommended. 3. Colonic diverticulosis without evidence of diverticulitis. 4. Stable appearance of bilateral lower lobe pulmonary nodules dating back to 08/04/2013. No further follow-up imaging is required. Electronically Signed   By: Davina Poke M.D.   On: 12/31/2018 10:49    Micro Results   Recent Results (from the past 240 hour(s))  Urine Culture     Status: Abnormal   Collection Time: 12/31/18 10:50 AM   Specimen: Urine, Catheterized  Result Value Ref Range  Status   Specimen Description   Final    URINE, CATHETERIZED Performed at [facility], [address], [place], [place] [address]    Special Requests   Final    Normal Performed at [facility], [address] [place], [place] [address]    Culture >=100,000 COLONIES/mL KLEBSIELLA PNEUMONIAE (A)  Final   Report Status 01/03/2019 FINAL  Final   Organism ID, Bacteria KLEBSIELLA PNEUMONIAE (A)  Final      Susceptibility   Klebsiella pneumoniae - MIC*    AMPICILLIN >=32 RESISTANT Resistant     CEFAZOLIN <=4 SENSITIVE Sensitive     CEFTRIAXONE <=1 SENSITIVE Sensitive      CIPROFLOXACIN <=0.25 SENSITIVE Sensitive     GENTAMICIN <=1 SENSITIVE Sensitive     IMIPENEM 0.5 SENSITIVE Sensitive     NITROFURANTOIN 64 INTERMEDIATE Intermediate     TRIMETH/SULFA <=20 SENSITIVE Sensitive     AMPICILLIN/SULBACTAM 8 SENSITIVE Sensitive     PIP/TAZO <=4 SENSITIVE Sensitive     Extended ESBL NEGATIVE Sensitive     * >=100,000 COLONIES/mL KLEBSIELLA PNEUMONIAE  SARS Coronavirus 2 [facility] order, Performed in [facility] hospital lab) Nasopharyngeal Nasopharyngeal Swab     Status: None   Collection Time: 12/31/18  3:40 PM   Specimen: Nasopharyngeal Swab  Result Value Ref Range Status   SARS Coronavirus 2 NEGATIVE NEGATIVE Final    Comment: (NOTE) If result is NEGATIVE SARS-CoV-2 target nucleic acids are NOT DETECTED. The SARS-CoV-2 RNA is generally detectable in upper and lower  respiratory specimens during the acute phase of infection. The lowest  concentration of SARS-CoV-2 viral copies this assay can detect is 250  copies / mL. A negative result does not preclude SARS-CoV-2 infection  and should not be used as the sole basis for treatment or other  patient management decisions.  A negative result may occur with  improper specimen collection / handling, submission of specimen other  than nasopharyngeal swab, presence of viral mutation(s) within the  areas targeted by this assay, and inadequate number of viral copies  (<250 copies / mL). A negative result must be combined with clinical  observations, patient history, and epidemiological information. If result is POSITIVE SARS-CoV-2 target nucleic acids are DETECTED. The SARS-CoV-2 RNA is generally detectable in upper and lower  respiratory specimens dur ing the acute phase of infection.  Positive  results are indicative of active infection with SARS-CoV-2.  Clinical  correlation with patient history and other diagnostic information is  necessary to determine patient infection status.  Positive results do  not rule  out bacterial infection or co-infection with other viruses. If result is PRESUMPTIVE POSTIVE SARS-CoV-2 nucleic acids MAY BE PRESENT.   A presumptive positive result was obtained on the submitted specimen  and confirmed on repeat testing.  While 2019 novel coronavirus  (SARS-CoV-2) nucleic acids may be present in the submitted sample  additional confirmatory testing may be necessary for epidemiological  and / or clinical management purposes  to differentiate between  SARS-CoV-2 and other Sarbecovirus currently known to infect humans.  If clinically indicated additional testing with an alternate test  methodology [phone]) is advised. The SARS-CoV-2 RNA is generally  detectable in upper and lower respiratory sp ecimens during the acute  phase of infection. The expected result is Negative. Fact Sheet for Patients:  StrictlyIdeas.no Fact Sheet for Healthcare Providers: BankingDealers.co.za This test is not yet approved or cleared by the [place] FDA and has been authorized for detection and/or diagnosis of SARS-CoV-2 by FDA  under an Emergency Use Authorization (EUA).  This EUA will remain in effect (meaning this test can be used) for the duration of the COVID-19 declaration under Section 564(b)(1) of the Act, 21 U.S.C. section 360bbb-3(b)(1), unless the authorization is terminated or revoked sooner. Performed at [facility], [address], [place], [place] [address]        Today   Subjective    Susan Hodge today has no new complaints,    -Tolerated colonoscopy well, eager to go home, eating and drinking okay post colonoscopy        Patient has been seen and examined prior to discharge   Objective   Blood pressure 113/66, pulse 60, temperature (!) 97.2 F (36.2 C), resp. rate 20, height 5\' 1"  (1.549 m), weight 56.2 kg, SpO2 100 %.   Intake/Output Summary (Last 24 hours) at 01/03/2019 1802 Last data filed at 01/03/2019  1656 Gross per 24 hour  Intake 2952.36 ml  Output --  Net 2952.36 ml    Exam Gen:- Awake Alert,  In no apparent distress  HEENT:- [place].AT, No sclera icterus Neck-Supple Neck,No JVD,.  Lungs-  CTAB , fair symmetrical air movement CV- S1, S2 normal, regular  Abd-  +ve B.Sounds, Abd Soft, no significant tenderness extremity/Skin:-  pedal pulses present  Psych-affect is appropriate, oriented x3 Neuro-generalized weakness, no new focal deficits, no tremors MSK--chronic left lower extremity weakness and swelling   Data Review   CBC w Diff:  Lab Results  Component Value Date   WBC 3.0 (L) 01/01/2019   HGB 7.7 (L) 01/03/2019   HCT 24.9 (L) 01/03/2019   PLT 169 01/01/2019   LYMPHOPCT 20 12/31/2018   MONOPCT 7 12/31/2018   EOSPCT 1 12/31/2018   BASOPCT 0 12/31/2018    CMP:  Lab Results  Component Value Date   NA 138 01/01/2019   K 3.8 01/01/2019   CL 106 01/01/2019   CO2 22 01/01/2019   BUN 21 01/01/2019   CREATININE 0.78 01/01/2019   PROT 6.1 (L) 12/31/2018   ALBUMIN 3.2 (L) 12/31/2018   BILITOT 0.5 12/31/2018   ALKPHOS 42 12/31/2018   AST 15 12/31/2018   ALT 9 12/31/2018  . Total Discharge time is about 33 minutes  Roxan Hockey M.D on 01/03/2019 at 6:02 PM  Go to [email or web address] -  for contact info  [facility] - Office  [phone]

## 2019-01-03 NOTE — Progress Notes (Signed)
  Subjective:  Patient states she has had had few bowel movements but did not see any blood or black stool.  She says left-sided abdominal pain has resolved.  Objective: Blood pressure 123/64, pulse 70, temperature 98.9 F (37.2 C), resp. rate 15, height 5\' 1"  (1.549 m), weight 56.2 kg, SpO2 100 %.  Patient is alert. Abdominal exam reveals soft abdomen with mild tenderness in left midabdomen laterally.  No organomegaly or masses.   Labs/studies Results:  CBC Latest Ref Rng & Units 01/03/2019 01/02/2019 01/02/2019  WBC 4.0 - 10.5 K/uL - - -  Hemoglobin 12.0 - 15.0 g/dL 7.7(L) 7.9(L) 7.6(L)  Hematocrit 36.0 - 46.0 % 24.9(L) 25.2(L) 24.9(L)  Platelets 150 - 400 K/uL - - -    CMP Latest Ref Rng & Units 01/01/2019 12/31/2018 08/09/2018  Glucose 70 - 99 mg/dL 99 92 100(H)  BUN 8 - 23 mg/dL 21 14 16   Creatinine 0.44 - 1.00 mg/dL 0.78 0.96 0.77  Sodium 135 - 145 mmol/L 138 141 134(L)  Potassium 3.5 - 5.1 mmol/L 3.8 4.2 3.9  Chloride 98 - 111 mmol/L 106 110 99  CO2 22 - 32 mmol/L 22 25 27   Calcium 8.9 - 10.3 mg/dL 8.9 8.6(L) 8.4(L)  Total Protein 6.5 - 8.1 g/dL - 6.1(L) -  Total Bilirubin 0.3 - 1.2 mg/dL - 0.5 -  Alkaline Phos 38 - 126 U/L - 42 -  AST 15 - 41 U/L - 15 -  ALT 0 - 44 U/L - 9 -      Assessment:  #1.  GI bleed.  Patient underwent esophagogastroduodenoscopy yesterday and no bleeding lesion was identified.  Patient is to undergo colonoscopy later today.  She did not take her prep so we will give her half a bottle of mag citrate  #2.  Left-sided abdominal pain that is resolved.  Possibly musculoskeletal pain.  #3.  Anemia secondary GI bleed.  Hemoglobin this morning is 7.7.  No evidence of active bleeding.  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  #5.  UTI.  Patient is on ceftriaxone.   Plan:  Colonoscopy later today. We will give her a 5 ounces of mag citrate this morning.

## 2019-01-03 NOTE — Evaluation (Signed)
Physical Therapy Evaluation Patient Details Name: Susan Hodge MRN: [id number] DOB: 1950/05/16 Today's Date: 01/03/2019   History of Present Illness  Susan Hodge  is a 68 y.o. female with past medical history remarkable for in the move with chronic left lower extremity weakness presents with 1 week of  Worsening left lower extremity weakness, generalized weakness, nausea and vomiting and left flank discomfort/abdominal discomfort for the last 3 days-She feels like she has to have a bowel movement... She did have a BM that appeared darker than normal for her today    Clinical Impression  Patient functioning at baseline for functional mobility and gait.  Physical therapy evaluation, therapeutic activity.  Patient discharged to care of nursing for ambulation daily as tolerated for length of stay.    Follow Up Recommendations No PT follow up    Equipment Recommendations  None recommended by PT    Recommendations for Other Services       Precautions / Restrictions Precautions Precautions: None Restrictions Weight Bearing Restrictions: No      Mobility  Bed Mobility Overal bed mobility: Modified Independent             General bed mobility comments: head of bed raised  Transfers Overall transfer level: Modified independent Equipment used: Rolling walker (2 wheeled);None                Ambulation/Gait Ambulation/Gait assistance: Modified independent (Device/Increase time) Gait Distance (Feet): 30 Feet Assistive device: Rolling walker (2 wheeled) Gait Pattern/deviations: Decreased step length - right;Decreased step length - left;Decreased stride length Gait velocity: decreased   General Gait Details: slightly labored cadence without loss of balance, limited left ankle dorsiflexion when taking steps which is baseline per patient and normally wears an AFO for left ankle and shoe lift for RLE due to leg length discrepancy  Stairs            Wheelchair  Mobility    Modified Rankin (Stroke Patients Only)       Balance Overall balance assessment: Needs assistance Sitting-balance support: Feet supported;No upper extremity supported Sitting balance-Leahy Scale: Good     Standing balance support: Bilateral upper extremity supported;During functional activity Standing balance-Leahy Scale: Good Standing balance comment: using RW                             Pertinent Vitals/Pain Pain Assessment: No/denies pain    Home Living Family/patient expects to be discharged to:: Private residence Living Arrangements: Alone Available Help at Discharge: Family;Available 24 hours/day Type of Home: Mobile home Home Access: Ramped entrance     Home Layout: One level Home Equipment: [place] - 2 wheels;Wheelchair - [employer];Shower seat      Prior Function Level of Independence: Independent with assistive device(s)         Comments: household and short distanced community ambulator with RW     Hand Dominance   Dominant Hand: Left    Extremity/Trunk Assessment   Upper Extremity Assessment Upper Extremity Assessment: Overall WFL for tasks assessed    Lower Extremity Assessment Lower Extremity Assessment: Overall WFL for tasks assessed    Cervical / Trunk Assessment Cervical / Trunk Assessment: Normal  Communication   Communication: No difficulties  Cognition Arousal/Alertness: Awake/alert Behavior During Therapy: WFL for tasks assessed/performed Overall Cognitive Status: Within Functional Limits for tasks assessed  General Comments      Exercises     Assessment/Plan    PT Assessment Patent does not need any further PT services  PT Problem List         PT Treatment Interventions      PT Goals (Current goals can be found in the Care Plan section)  Acute Rehab PT Goals Patient Stated Goal: return home PT Goal Formulation: With  patient Time For Goal Achievement: 01/03/19 Potential to Achieve Goals: Good    Frequency     Barriers to discharge        Co-evaluation               AM-PAC PT "6 Clicks" Mobility  Outcome Measure Help needed turning from your back to your side while in a flat bed without using bedrails?: None Help needed moving from lying on your back to sitting on the side of a flat bed without using bedrails?: None Help needed moving to and from a bed to a chair (including a wheelchair)?: None Help needed standing up from a chair using your arms (e.g., wheelchair or bedside chair)?: None Help needed to walk in hospital room?: None Help needed climbing 3-5 steps with a railing? : A Little 6 Click Score: 23    End of Session   Activity Tolerance: Patient tolerated treatment well;Other (comment)(Patient limited secondary to diarrhea, taking colonscopy prep) Patient left: in bed;with call bell/phone within reach Nurse Communication: Mobility status PT Visit Diagnosis: Unsteadiness on feet (R26.81);Other abnormalities of gait and mobility (R26.89);Muscle weakness (generalized) (M62.81)    Time: [id number] PT Time Calculation (min) (ACUTE ONLY): 25 min   Charges:   PT Evaluation $PT Eval Moderate Complexity: 1 Mod PT Treatments $Therapeutic Activity: 23-37 mins        2:44 PM, 01/03/19 Lonell Grandchild, MPT Physical Therapist with [facility] [phone] office [phone] mobile phone

## 2019-01-03 NOTE — Anesthesia Postprocedure Evaluation (Signed)
Anesthesia Post Note  Patient: Susan Hodge  Procedure(s) Performed: COLONOSCOPY WITH PROPOFOL (N/A ) POLYPECTOMY  Patient location during evaluation: PACU Anesthesia Type: General Level of consciousness: patient cooperative and awake and alert Pain management: pain level controlled Vital Signs Assessment: post-procedure vital signs reviewed and stable Respiratory status: spontaneous breathing Cardiovascular status: stable Postop Assessment: no apparent nausea or vomiting Anesthetic complications: no     Last Vitals:  Vitals:   01/03/19 1451 01/03/19 1639  BP: 125/62 115/64  Pulse: (!) 56 63  Resp: 15 17  Temp:  (!) 36.2 C  SpO2: 100% 100%    Last Pain:  Vitals:   01/03/19 1639  TempSrc:   PainSc: 0-No pain                 Jabrea Kallstrom A

## 2019-01-03 NOTE — Progress Notes (Signed)
[facility]  Spoke with Dr. Denton Brick. No indication for surgical consult at this time. Have d/c the order.  Curlene Labrum, MD [facility] [address] [place], [place] [address] [phone] (office)

## 2019-01-04 ENCOUNTER — Encounter (HOSPITAL_COMMUNITY): Payer: Self-pay | Admitting: Internal Medicine

## 2019-01-04 NOTE — Addendum Note (Signed)
Addendum  created 01/04/19 0803 by Mickel Baas, CRNA   Charge Capture section accepted

## 2019-01-05 ENCOUNTER — Telehealth: Payer: Self-pay | Admitting: *Deleted

## 2019-01-05 ENCOUNTER — Telehealth: Payer: Self-pay | Admitting: Neurology

## 2019-01-05 NOTE — Telephone Encounter (Signed)
Pt called stating that she was hospitalized and was released Sept. 1st and is wanting to know should she delay her infusion or should she go ahead and do it. Please advise.

## 2019-01-05 NOTE — Telephone Encounter (Signed)
Dr. Felecia Shelling- FYI Called and spoke with pt. She would like to delay infusion to next week. She is very sore since being discharged from hospital and dealing with migraine. She will call [facility] to r/s her infusion to next week.

## 2019-01-05 NOTE — Telephone Encounter (Signed)
Took call from phone staff. Spoke with Susan Hodge, Therapist, sports at [employer]. Notified us that pt admitted 12/31/18-01/03/19 for neuromyelitis optica/GI bleed/UTI. Being treated with cephalexin/keflex. Pt appt cx for 01/07/19 for Soliris somehow. They tried reaching pt/family to r/s but waiting to hear back. I placed her on hold. Notified Dr. Felecia Shelling. Relayed per Dr. Felecia Shelling that they are ok to r/s Soliris per Dr. Felecia Shelling. She verbalized understanding and will continue to try to reach pt to r/s infusion.

## 2019-01-07 ENCOUNTER — Inpatient Hospital Stay (HOSPITAL_COMMUNITY): Admission: RE | Admit: 2019-01-07 | Payer: Medicare HMO | Source: Ambulatory Visit

## 2019-01-07 ENCOUNTER — Encounter (HOSPITAL_COMMUNITY)
Admission: RE | Admit: 2019-01-07 | Discharge: 2019-01-07 | Disposition: A | Discharge status: Home / Self Care | Payer: Medicare HMO | Source: Ambulatory Visit | Attending: Neurology | Admitting: Neurology

## 2019-01-07 ENCOUNTER — Encounter (HOSPITAL_COMMUNITY): Admission: RE | Admit: 2019-01-07 | Payer: Medicare HMO | Source: Ambulatory Visit

## 2019-01-07 DIAGNOSIS — Z299 Encounter for prophylactic measures, unspecified: Secondary | ICD-10-CM | POA: Diagnosis not present

## 2019-01-07 DIAGNOSIS — M329 Systemic lupus erythematosus, unspecified: Secondary | ICD-10-CM | POA: Diagnosis not present

## 2019-01-07 DIAGNOSIS — Z6823 Body mass index (BMI) 23.0-23.9, adult: Secondary | ICD-10-CM | POA: Diagnosis not present

## 2019-01-07 DIAGNOSIS — M797 Fibromyalgia: Secondary | ICD-10-CM | POA: Diagnosis not present

## 2019-01-07 DIAGNOSIS — K625 Hemorrhage of anus and rectum: Secondary | ICD-10-CM | POA: Diagnosis not present

## 2019-01-13 ENCOUNTER — Encounter (INDEPENDENT_AMBULATORY_CARE_PROVIDER_SITE_OTHER): Payer: Self-pay | Admitting: *Deleted

## 2019-01-14 ENCOUNTER — Other Ambulatory Visit: Payer: Self-pay

## 2019-01-14 ENCOUNTER — Encounter (HOSPITAL_COMMUNITY)
Admission: RE | Admit: 2019-01-14 | Discharge: 2019-01-14 | Disposition: A | Discharge status: Home / Self Care | Payer: Medicare HMO | Source: Ambulatory Visit | Attending: Neurology | Admitting: Neurology

## 2019-01-14 DIAGNOSIS — G360 Neuromyelitis optica [Devic]: Secondary | ICD-10-CM | POA: Insufficient documentation

## 2019-01-14 MED ORDER — NON FORMULARY: 1200.0000 mg | Freq: Once | Status: DC

## 2019-01-14 MED ORDER — SODIUM CHLORIDE 0.9 % IV SOLN
1200.0000 mg | Freq: Once | INTRAVENOUS | Status: AC
  Administered 2019-01-14: 1200 mg via INTRAVENOUS
  Filled 2019-01-14: qty 120

## 2019-01-14 MED ORDER — SODIUM CHLORIDE 0.9 % IV SOLN
Freq: Once | INTRAVENOUS | Status: AC
  Administered 2019-01-14: 13:00:00 via INTRAVENOUS

## 2019-01-18 DIAGNOSIS — R2689 Other abnormalities of gait and mobility: Secondary | ICD-10-CM | POA: Diagnosis not present

## 2019-01-24 ENCOUNTER — Telehealth (INDEPENDENT_AMBULATORY_CARE_PROVIDER_SITE_OTHER): Payer: Self-pay | Admitting: Internal Medicine

## 2019-01-24 NOTE — Telephone Encounter (Signed)
Notes recorded by Rogene Houston, MD on 01/11/2019 at 1:45 PM EDT  Unable to reach patient despite multiple attempts.  Please call patient let her know both polyps are tubular adenomas.  She needs to have H&H unless she already had one done by PCP.  Next colonoscopy in 5 years.  Report to PCP.  Patient will be called and given results.

## 2019-01-24 NOTE — Telephone Encounter (Signed)
Patient left message wanting to know test results  -  Ph# [phone]

## 2019-01-24 NOTE — Telephone Encounter (Signed)
Patient was called. I gave her the results of her TCS and pathology . She is still having stomach pain , and nausea. She notices it more when she eats spaghetti . For the nausea she still takes the Zgran. Advised that we would address with Dr.Rehman and call her back.

## 2019-01-25 NOTE — Telephone Encounter (Signed)
Patient has called back and she says that she is having Nausea. She has taken medication for the constipation.

## 2019-01-26 DIAGNOSIS — K625 Hemorrhage of anus and rectum: Secondary | ICD-10-CM | POA: Diagnosis not present

## 2019-01-26 NOTE — Telephone Encounter (Signed)
Per Dr.Rehman,patient should take the Senokot daily , Zofran 4 mg twice a day as needed for nausea and vomiting. Patient needs a office visit with Dr.Rehman,per him. Patient called and given Dr.Rehman's recommendation.

## 2019-01-26 NOTE — Telephone Encounter (Signed)
An attempt was made to call the patient. Her voicemail is full and I was unable to leave a message.

## 2019-01-28 ENCOUNTER — Encounter (HOSPITAL_COMMUNITY)
Admission: RE | Admit: 2019-01-28 | Discharge: 2019-01-28 | Disposition: A | Discharge status: Home / Self Care | Payer: Medicare HMO | Source: Ambulatory Visit | Attending: Neurology | Admitting: Neurology

## 2019-01-28 ENCOUNTER — Encounter (HOSPITAL_COMMUNITY): Payer: Self-pay

## 2019-01-28 ENCOUNTER — Other Ambulatory Visit: Payer: Self-pay

## 2019-01-28 DIAGNOSIS — G360 Neuromyelitis optica [Devic]: Secondary | ICD-10-CM | POA: Diagnosis not present

## 2019-01-28 MED ORDER — NON FORMULARY: 1200.0000 mg | Freq: Once | Status: DC

## 2019-01-28 MED ORDER — SODIUM CHLORIDE 0.9 % IV SOLN
Freq: Once | INTRAVENOUS | Status: AC
  Administered 2019-01-28: 14:00:00 via INTRAVENOUS

## 2019-01-28 MED ORDER — SODIUM CHLORIDE 0.9 % IV SOLN
1200.0000 mg | Freq: Once | INTRAVENOUS | Status: AC
  Administered 2019-01-28: 1200 mg via INTRAVENOUS
  Filled 2019-01-28: qty 120

## 2019-02-11 ENCOUNTER — Other Ambulatory Visit: Payer: Self-pay

## 2019-02-11 ENCOUNTER — Encounter (HOSPITAL_COMMUNITY)
Admission: RE | Admit: 2019-02-11 | Discharge: 2019-02-11 | Disposition: A | Discharge status: Home / Self Care | Payer: Medicare HMO | Source: Ambulatory Visit | Attending: Neurology | Admitting: Neurology

## 2019-02-11 DIAGNOSIS — G360 Neuromyelitis optica [Devic]: Secondary | ICD-10-CM | POA: Diagnosis not present

## 2019-02-11 MED ORDER — SODIUM CHLORIDE 0.9 % IV SOLN
Freq: Once | INTRAVENOUS | Status: AC
  Administered 2019-02-11: 14:00:00 via INTRAVENOUS

## 2019-02-11 MED ORDER — NON FORMULARY: 1200.0000 mg | Freq: Once | Status: DC

## 2019-02-11 MED ORDER — SODIUM CHLORIDE 0.9 % IV SOLN
1200.0000 mg | Freq: Once | INTRAVENOUS | Status: AC
  Administered 2019-02-11: 1200 mg via INTRAVENOUS
  Filled 2019-02-11: qty 120

## 2019-02-17 DIAGNOSIS — R2689 Other abnormalities of gait and mobility: Secondary | ICD-10-CM | POA: Diagnosis not present

## 2019-02-18 ENCOUNTER — Other Ambulatory Visit (INDEPENDENT_AMBULATORY_CARE_PROVIDER_SITE_OTHER): Payer: Self-pay | Admitting: *Deleted

## 2019-02-18 DIAGNOSIS — D500 Iron deficiency anemia secondary to blood loss (chronic): Secondary | ICD-10-CM

## 2019-02-18 DIAGNOSIS — K219 Gastro-esophageal reflux disease without esophagitis: Secondary | ICD-10-CM

## 2019-02-18 NOTE — Progress Notes (Signed)
CBC ordered 

## 2019-02-23 DIAGNOSIS — M545 Low back pain: Secondary | ICD-10-CM | POA: Diagnosis not present

## 2019-02-23 DIAGNOSIS — B029 Zoster without complications: Secondary | ICD-10-CM | POA: Diagnosis not present

## 2019-02-23 DIAGNOSIS — M130 Polyarthritis, unspecified: Secondary | ICD-10-CM | POA: Diagnosis not present

## 2019-02-23 DIAGNOSIS — G43709 Chronic migraine without aura, not intractable, without status migrainosus: Secondary | ICD-10-CM | POA: Diagnosis not present

## 2019-02-24 DIAGNOSIS — Z6822 Body mass index (BMI) 22.0-22.9, adult: Secondary | ICD-10-CM | POA: Diagnosis not present

## 2019-02-24 DIAGNOSIS — Z01419 Encounter for gynecological examination (general) (routine) without abnormal findings: Secondary | ICD-10-CM | POA: Diagnosis not present

## 2019-02-24 DIAGNOSIS — H6691 Otitis media, unspecified, right ear: Secondary | ICD-10-CM | POA: Diagnosis not present

## 2019-02-24 DIAGNOSIS — G43909 Migraine, unspecified, not intractable, without status migrainosus: Secondary | ICD-10-CM | POA: Diagnosis not present

## 2019-02-24 DIAGNOSIS — Z299 Encounter for prophylactic measures, unspecified: Secondary | ICD-10-CM | POA: Diagnosis not present

## 2019-02-24 DIAGNOSIS — R569 Unspecified convulsions: Secondary | ICD-10-CM | POA: Diagnosis not present

## 2019-02-24 DIAGNOSIS — M797 Fibromyalgia: Secondary | ICD-10-CM | POA: Diagnosis not present

## 2019-02-25 ENCOUNTER — Encounter (HOSPITAL_COMMUNITY)
Admission: RE | Admit: 2019-02-25 | Discharge: 2019-02-25 | Disposition: A | Discharge status: Home / Self Care | Payer: Medicare HMO | Source: Ambulatory Visit | Attending: Neurology | Admitting: Neurology

## 2019-02-25 ENCOUNTER — Other Ambulatory Visit: Payer: Self-pay

## 2019-02-25 DIAGNOSIS — G360 Neuromyelitis optica [Devic]: Secondary | ICD-10-CM | POA: Diagnosis not present

## 2019-02-25 MED ORDER — SODIUM CHLORIDE 0.9 % IV SOLN
Freq: Once | INTRAVENOUS | Status: AC
  Administered 2019-02-25: 14:00:00 via INTRAVENOUS

## 2019-02-25 MED ORDER — SODIUM CHLORIDE 0.9 % IV SOLN
1200.0000 mg | Freq: Once | INTRAVENOUS | Status: AC
  Administered 2019-02-25: 1200 mg via INTRAVENOUS
  Filled 2019-02-25: qty 120

## 2019-02-25 MED ORDER — NON FORMULARY: 1200.0000 mg | Freq: Once | Status: DC

## 2019-03-01 ENCOUNTER — Ambulatory Visit (INDEPENDENT_AMBULATORY_CARE_PROVIDER_SITE_OTHER): Payer: Medicare HMO | Admitting: Internal Medicine

## 2019-03-11 ENCOUNTER — Encounter (HOSPITAL_COMMUNITY)
Admission: RE | Admit: 2019-03-11 | Discharge: 2019-03-11 | Disposition: A | Discharge status: Home / Self Care | Payer: Medicare HMO | Source: Ambulatory Visit | Attending: Neurology | Admitting: Neurology

## 2019-03-11 ENCOUNTER — Encounter (HOSPITAL_COMMUNITY): Payer: Self-pay

## 2019-03-11 ENCOUNTER — Other Ambulatory Visit: Payer: Self-pay

## 2019-03-11 DIAGNOSIS — G360 Neuromyelitis optica [Devic]: Secondary | ICD-10-CM | POA: Insufficient documentation

## 2019-03-11 MED ORDER — NON FORMULARY: 1200.0000 mg | Freq: Once | Status: DC

## 2019-03-11 MED ORDER — SODIUM CHLORIDE 0.9 % IV SOLN
Freq: Once | INTRAVENOUS | Status: AC
  Administered 2019-03-11: 15:00:00 via INTRAVENOUS

## 2019-03-11 MED ORDER — SODIUM CHLORIDE 0.9 % IV SOLN
1200.0000 mg | Freq: Once | INTRAVENOUS | Status: AC
  Administered 2019-03-11: 1200 mg via INTRAVENOUS
  Filled 2019-03-11: qty 120

## 2019-03-16 ENCOUNTER — Telehealth: Payer: Self-pay | Admitting: *Deleted

## 2019-03-16 NOTE — Telephone Encounter (Signed)
Current PA Soliris set to expire 03/26/2019. I called [employer] to try and submit over the phone but they instructed me to submit with specialty medication department. Phone: [phone]. I submitted PA on CMM: [id number]. Waiting on determination.   Pt receiving Soliris 1200mg  every 2 wks at [facility]. Phone: [phone]. J1300. Address: [place], [place] [address]. She has been on this since 10/05/2018 and tolerating well. Dx: Neuromylitis optica (G36.0) She had the NMO IgG autoantibodies lab done 08/01/2018: 139 (reference range: 0.0-3.0).

## 2019-03-17 ENCOUNTER — Telehealth: Payer: Self-pay | Admitting: *Deleted

## 2019-03-17 NOTE — Telephone Encounter (Signed)
Called, LVM for pt to call office back. She was last seen 09/2018 and she is past due for f/u with Dr. Felecia Shelling. Asked her to call back to schedule. I will speak with her when she calls to make appt. We will have to work her in for appt.

## 2019-03-17 NOTE — Telephone Encounter (Signed)
Received fax notification from [facility] that [place] approved 03/27/2019-09/24/2019. Auth# [id number].  Soliris solution 300mg /58ml. J1300.  Faxed new orders to [facility] at [phone] with PA approval info. Received fax confirmation.

## 2019-03-20 DIAGNOSIS — R2689 Other abnormalities of gait and mobility: Secondary | ICD-10-CM | POA: Diagnosis not present

## 2019-03-21 NOTE — Telephone Encounter (Signed)
Pt called back and scheduled f/u with AL, NP on 04/05/2019.

## 2019-03-23 DIAGNOSIS — B029 Zoster without complications: Secondary | ICD-10-CM | POA: Diagnosis not present

## 2019-03-23 DIAGNOSIS — M545 Low back pain: Secondary | ICD-10-CM | POA: Diagnosis not present

## 2019-03-23 DIAGNOSIS — M130 Polyarthritis, unspecified: Secondary | ICD-10-CM | POA: Diagnosis not present

## 2019-03-23 DIAGNOSIS — G43709 Chronic migraine without aura, not intractable, without status migrainosus: Secondary | ICD-10-CM | POA: Diagnosis not present

## 2019-03-25 ENCOUNTER — Other Ambulatory Visit: Payer: Self-pay

## 2019-03-25 ENCOUNTER — Encounter (HOSPITAL_COMMUNITY)
Admission: RE | Admit: 2019-03-25 | Discharge: 2019-03-25 | Disposition: A | Discharge status: Home / Self Care | Payer: Medicare HMO | Source: Ambulatory Visit | Attending: Neurology | Admitting: Neurology

## 2019-03-25 ENCOUNTER — Encounter (HOSPITAL_COMMUNITY): Payer: Self-pay

## 2019-03-25 DIAGNOSIS — G360 Neuromyelitis optica [Devic]: Secondary | ICD-10-CM | POA: Diagnosis not present

## 2019-03-25 MED ORDER — SODIUM CHLORIDE 0.9 % IV SOLN
Freq: Once | INTRAVENOUS | Status: AC
  Administered 2019-03-25: 13:00:00 via INTRAVENOUS

## 2019-03-25 MED ORDER — NON FORMULARY: 1200.0000 mg | Freq: Once | Status: DC

## 2019-03-25 MED ORDER — SODIUM CHLORIDE 0.9 % IV SOLN
1200.0000 mg | Freq: Once | INTRAVENOUS | Status: AC
  Administered 2019-03-25: 1200 mg via INTRAVENOUS
  Filled 2019-03-25: qty 120

## 2019-04-04 NOTE — Progress Notes (Deleted)
PATIENT: Susan Hodge DOB: [birth year]  REASON FOR VISIT: follow up HISTORY FROM: patient  Virtual Visit via Telephone Note  I connected with Susan Hodge on 04/04/19 at 11:30 AM EST by telephone and verified that I am speaking with the correct person using two identifiers.   I discussed the limitations, risks, security and privacy concerns of performing an evaluation and management service by telephone and the availability of in person appointments. I also discussed with the patient that there may be a patient responsible charge related to this service. The patient expressed understanding and agreed to proceed.   History of Present Illness:  04/04/19 Susan Hodge is a 68 y.o. female here today for follow up for neuromyelitis optica. She was started on Solaris 1200mg  every 2 weeks in 10/2018.     History (copied from Dr Garth Bigness note on 09/23/2018)  At the last visit, given the combination of her presentation, spinal cord MRI and anti-NMO antibody, she was diagnosed with neuromyelitis optica.  She appeared to have had a brand-new exacerbation that started a day or 2 before the visit and I gave her 1 day of high-dose IV Solu-Medrol followed by a very high-dose prednisone oral taper.  She feels she improved a lot after the steroids.  Specifically, she is able to walk with her walker again (she had been doing that when discharged from the hospital but lost that ability a day or 2 before the visit).  Additionally, the left hand weakness and clumsiness which began shortly before the visit has improved though her left arm is still worse than the right arm.  Of note, she is left-handed.  She is able to write again though her handwriting is messy.  She had not been able to write a couple weeks ago.    At that time, we discussed treatment options.  Soliris is FDA approved for neuromyelitis optica and Rituxan is used quite a bit off label.  Because of the COVID-19 pandemic, Soliris would be  safer.  She did get the initial meningitis vaccination (both the [place] and the B shots) and she will need boosters next month for the 1 and in 2 months for the other.  Hopefully she will quickly get authorized so we can get her Soliris infusions started.  I discussed with her that since she appears to respond well to the steroids that I will place her on a 20 mg oral dose in the interim though the IV will be much stronger.  Vasculitis lab work performed at the last visit was negative.  Currently, gait is still poor though she is able to use the walker.   She has clumsiness in the left hand though it is much better than 2 weeks ago.  Strength is better in the left hand and arm.  Vision is fine.  She has urinary urgency and rare urge incontinence (just once since last visit and couldn't get to bathroom in time.  She is on Enablex 7.5 mg and we discussed a higher dose.  She has pain in her feet and her back.   She notes her legs will spasm when she stands up.   She also has a dysesthetic sensation in both feet.  Lyrica has helped that some.   HPI: I had the pleasure of seeing your patient, Susan Hodge, at [facility] for neurologic consultation regarding her transverse myelitis.  She began to experience clumsiness in mid March.  She had been paced on topiramate  for migraines and had recently and had some dizziness and vertigo initially.  She fell and a day later began to have urinary incontinence and inability to move the left leg.   She went to the ED at [facility] and an MRI of the lumbar spine shoowed degenerative disc changes.    She was prescribed a steroid pack and discharged.   The next day she was worse and her PCP sent her to the [facility] ED.   She had MRI of the brain and cervical and thoracic spine.   The MRI of hte brain showed a remote right MCA stroke (she never had symptoms).   MRI of the spine showed multifocal lesions within the spinal cord, centrally but asymmetric.       She received 5 days of IV Solu-Medrol in the hospital.   She initially improved after the steroids and was using a walker well a couple weeks later at discharge from rehab.  Balance was still poor but strength was good.   Of note she had no arm weakness in March or April.   Over the past couple days, she has felt much weaker with more left leg > arm weakness.  .  She also has had right flank numbness and a dysesthetic sensation (pressure like pain) since yesterday.      Bladder impoved with steroids and is continuing to do well (she is on Enablex).    She notes no change in vision.  She notes some fatigue especially with endurance.  She denies any depression or anxiety.  She had shingles Sep 24, 2017 in the right V1 distribution with ocular and forehead rash.   She has had headaches since that time.   Even before shingles, she was on Lyrica for leg pain.   Even before shingles, she had migraines and was on Depakene.  She is currently on Percocet 7.5 mg 3-4 a day.    Her only loss of vision occurred with the shingles last year and resolved as the rash resolved.   She was diagnosed with SLE in the 1980's but was never on an immunomodulatory agent.   She had a butterfly rash and abnormal tests.   We don't have any more details.  She also has a long history of migraine headaches that predated her more recent issues.  She is on Eliquis due to a DVT noted during her march admission.     I personally reviewed the MRIs of the brain and spine from March 2020.  The MRI of the brain 07/31/2018 shows a remote right MCA infarction with a small amount of associated hemosiderin.  There are also some scattered T2/flair hyperintense foci consistent with mild chronic microvascular ischemic changes.  There were no acute findings.  The MRI of the cervical spine shows T2 hyperintense signal within the spinal cord posteriorly to the right adjacent to C2, posterior to the left adjacent to C3, and a little more to the  right but central adjacent to C7.  The foci adjacent to C3 and C7 does have subtle enhancement after gadolinium was infused.  There are some degenerative changes at C4-C5, C5-C6 and C6-C7 but no definite nerve root compression and no spinal stenosis.   MRI of the thoracic spine shows diffuse patchy T2 hyperintensity that is predominantly central.   Number spine shows the abnormal signal within the spinal cord adjacent to T10 and T11.  There are degenerative changes at L3-L4, L4-L5 and L5-S1.  There is spinal stenosis  at L4-L5.  With possibility for left L4 and L5 nerve root  She had lab work during her hospitalization.  The neuromyelitis optica IgG antibody was very positive at 139 (< 3 normal).  Lumbar puncture showed a small elevation of white blood cell count, predominantly lymphocytes and macrophages.  B12, copper were normal.  Herpes simplex virus and HIV were negative.  ACE was normal.  Observations/Objective:  Generalized: Well developed, in no acute distress  Mentation: Alert oriented to time, place, history taking. Follows all commands speech and language fluent   Assessment and Plan:  68 y.o. year old female  has a past medical history of Chest pain, Collagen vascular disease ([place]), Complication of anesthesia, Depression, DJD (degenerative joint disease), FH: CVA (cerebrovascular accident), Fibromyalgia, GERD (gastroesophageal reflux disease), Hypothyroidism, Lupus ([place]), Migraines, Palpitations, Status post cholecystectomy, and Tobacco use. here with    ICD-10-CM   1. Neuromyelitis optica (devic) (HCC)  G36.0     No orders of the defined types were placed in this encounter.   No orders of the defined types were placed in this encounter.    Follow Up Instructions:  I discussed the assessment and treatment plan with the patient. The patient was provided an opportunity to ask questions and all were answered. The patient agreed with the plan and demonstrated an understanding of the  instructions.   The patient was advised to call back or seek an in-person evaluation if the symptoms worsen or if the condition fails to improve as anticipated.  I provided *** minutes of non-face-to-face time during this encounter.   Debbora Presto, NP

## 2019-04-05 ENCOUNTER — Encounter: Payer: Self-pay | Admitting: Family Medicine

## 2019-04-05 ENCOUNTER — Telehealth: Payer: Medicare HMO | Admitting: Family Medicine

## 2019-04-05 ENCOUNTER — Telehealth: Payer: Self-pay

## 2019-04-05 NOTE — Telephone Encounter (Signed)
(  I just returned from lunch)  Received a message that pt was unable to figure out how the [place]. Virtual visits work via Estate manager/land agent from [employer] rep [place].   Amy states that she may still be able to see her. If not she will have to r/s.  I informed the Front offce rep that the pt may call Mychart support.

## 2019-04-08 ENCOUNTER — Other Ambulatory Visit: Payer: Self-pay

## 2019-04-08 ENCOUNTER — Encounter (HOSPITAL_COMMUNITY)
Admission: RE | Admit: 2019-04-08 | Discharge: 2019-04-08 | Disposition: A | Discharge status: Home / Self Care | Payer: Medicare HMO | Source: Ambulatory Visit | Attending: Neurology | Admitting: Neurology

## 2019-04-08 DIAGNOSIS — G360 Neuromyelitis optica [Devic]: Secondary | ICD-10-CM | POA: Diagnosis not present

## 2019-04-08 MED ORDER — NON FORMULARY: 1200.0000 mg | Freq: Once | Status: DC

## 2019-04-08 MED ORDER — SODIUM CHLORIDE 0.9 % IV SOLN
1200.0000 mg | Freq: Once | INTRAVENOUS | Status: AC
  Administered 2019-04-08: 1200 mg via INTRAVENOUS
  Filled 2019-04-08: qty 120

## 2019-04-08 MED ORDER — SODIUM CHLORIDE 0.9 % IV SOLN
Freq: Once | INTRAVENOUS | Status: AC
  Administered 2019-04-08: 13:00:00 via INTRAVENOUS

## 2019-04-19 DIAGNOSIS — R2689 Other abnormalities of gait and mobility: Secondary | ICD-10-CM | POA: Diagnosis not present

## 2019-04-22 ENCOUNTER — Encounter (HOSPITAL_COMMUNITY)
Admission: RE | Admit: 2019-04-22 | Discharge: 2019-04-22 | Disposition: A | Discharge status: Home / Self Care | Payer: Medicare HMO | Source: Ambulatory Visit | Attending: Neurology | Admitting: Neurology

## 2019-04-22 ENCOUNTER — Other Ambulatory Visit: Payer: Self-pay

## 2019-04-22 ENCOUNTER — Encounter (HOSPITAL_COMMUNITY): Payer: Self-pay

## 2019-04-22 DIAGNOSIS — G360 Neuromyelitis optica [Devic]: Secondary | ICD-10-CM | POA: Diagnosis not present

## 2019-04-22 MED ORDER — NON FORMULARY: 1200.0000 mg | Freq: Once | Status: DC

## 2019-04-22 MED ORDER — SODIUM CHLORIDE 0.9 % IV SOLN: Freq: Once | INTRAVENOUS | Status: AC

## 2019-04-22 MED ORDER — SODIUM CHLORIDE 0.9 % IV SOLN
1200.0000 mg | Freq: Once | INTRAVENOUS | Status: AC
  Administered 2019-04-22: 1200 mg via INTRAVENOUS
  Filled 2019-04-22: qty 120

## 2019-05-08 ENCOUNTER — Encounter: Payer: Self-pay | Admitting: Family Medicine

## 2019-05-10 ENCOUNTER — Other Ambulatory Visit: Payer: Self-pay

## 2019-05-10 ENCOUNTER — Encounter (HOSPITAL_COMMUNITY)
Admission: RE | Admit: 2019-05-10 | Discharge: 2019-05-10 | Disposition: A | Discharge status: Home / Self Care | Payer: Medicare HMO | Source: Ambulatory Visit | Attending: Neurology | Admitting: Neurology

## 2019-05-10 ENCOUNTER — Encounter (HOSPITAL_COMMUNITY): Payer: Self-pay

## 2019-05-10 ENCOUNTER — Telehealth: Payer: Self-pay | Admitting: Family Medicine

## 2019-05-10 DIAGNOSIS — G360 Neuromyelitis optica [Devic]: Secondary | ICD-10-CM | POA: Insufficient documentation

## 2019-05-10 MED ORDER — SODIUM CHLORIDE 0.9 % IV SOLN
1200.0000 mg | Freq: Once | INTRAVENOUS | Status: AC
  Administered 2019-05-10: 14:00:00 1200 mg via INTRAVENOUS
  Filled 2019-05-10: qty 120

## 2019-05-10 MED ORDER — NON FORMULARY: 1200.0000 mg | Freq: Once | Status: DC

## 2019-05-10 MED ORDER — SODIUM CHLORIDE 0.9 % IV SOLN: Freq: Once | INTRAVENOUS | Status: AC

## 2019-05-11 ENCOUNTER — Telehealth (INDEPENDENT_AMBULATORY_CARE_PROVIDER_SITE_OTHER): Payer: Medicare HMO | Admitting: Family Medicine

## 2019-05-11 DIAGNOSIS — G360 Neuromyelitis optica [Devic]: Secondary | ICD-10-CM

## 2019-05-11 NOTE — Progress Notes (Signed)
   PATIENT: Susan Hodge DOB: [birth year]  REASON FOR VISIT: follow up HISTORY FROM: patient  Virtual Visit via Telephone Note  I connected with Susan Hodge on 05/12/19 at 11:30 AM EST by telephone and verified that I am speaking with the correct person using two identifiers.   I discussed the limitations, risks, security and privacy concerns of performing an evaluation and management service by telephone and the availability of in person appointments. I also discussed with the patient that there may be a patient responsible charge related to this service. The patient expressed understanding and agreed to proceed.   History of Present Illness:  05/12/19 Susan Hodge is a 69 y.o. female here today for follow up. She is doing much better. She continues Solaris infusions every two weeks. She is tolerating medication well with no obvious adverse effects. She reports that her leg weakness has improved. She no longer feels that her left leg is heavy. She is able to walk fairly well with her walker. She tries to be as active as possible. She does feel baclofen and Enablex has helped. No falls. She denies vision changes or changes in bowel or bladder habits. She does have urinary urgency. She has tried oxybutynin in the past but is not interested in restarting. She continues regular follow up with PCP, pain management and rheumatology.    Observations/Objective:  Generalized: Well developed, in no acute distress  Mentation: Alert oriented to time, place, history taking. Follows all commands speech and language fluent Motor: moves upper extremities freely, unable to visualize lower extremities, patient unable to walk and hold phone for video visit.    Assessment and Plan:  69 y.o. year old female  has a past medical history of Chest pain, Collagen vascular disease ([place]), Complication of anesthesia, Depression, DJD (degenerative joint disease), FH: CVA (cerebrovascular accident),  Fibromyalgia, GERD (gastroesophageal reflux disease), Hypothyroidism, Lupus ([place]), Migraines, Palpitations, Status post cholecystectomy, and Tobacco use. here with    ICD-10-CM   1. Neuromyelitis optica (devic) ([place])  G36.0    Susan Hodge reports that overall she is doing much better.  Weakness in left leg has improved.  She feels that heaviness has improved.  She is mobilizing with her walker.  No falls.  She has tolerated Soliris infusions every 2 weeks.  She denies adverse effects.  She is compliant with CDC recommendations to reduce exposure to COVID-19.  She continues to follow-up closely with primary care and other specialties as directed.  We will plan to bring her in the office to see Dr. Felecia Shelling for a full physical exam in April, assuming it is safe to do so.  She verbalizes understanding and agreement with this plan.   No orders of the defined types were placed in this encounter.   No orders of the defined types were placed in this encounter.    Follow Up Instructions:  I discussed the assessment and treatment plan with the patient. The patient was provided an opportunity to ask questions and all were answered. The patient agreed with the plan and demonstrated an understanding of the instructions.   The patient was advised to call back or seek an in-person evaluation if the symptoms worsen or if the condition fails to improve as anticipated.  I provided 25 minutes of non-face-to-face time during this encounter.  Patient is located at her place of residence during virtual visit.  Provider is located in the office.   Debbora Presto, NP

## 2019-05-12 ENCOUNTER — Encounter: Payer: Self-pay | Admitting: Family Medicine

## 2019-05-12 NOTE — Progress Notes (Signed)
I have read the note, and I agree with the clinical assessment and plan.  Efton Thomley A. Keiana Tavella, MD, PhD, FAAN Certified in Neurology, Clinical Neurophysiology, Sleep Medicine, Pain Medicine and Neuroimaging  [facility] [address], [address] [place], [place] [address] [phone]  

## 2019-05-20 DIAGNOSIS — R2689 Other abnormalities of gait and mobility: Secondary | ICD-10-CM | POA: Diagnosis not present

## 2019-05-24 ENCOUNTER — Encounter (HOSPITAL_COMMUNITY): Payer: Medicare HMO

## 2019-05-27 ENCOUNTER — Encounter (HOSPITAL_COMMUNITY)
Admission: RE | Admit: 2019-05-27 | Discharge: 2019-05-27 | Disposition: A | Discharge status: Home / Self Care | Payer: Medicare HMO | Source: Ambulatory Visit | Attending: Neurology | Admitting: Neurology

## 2019-05-27 ENCOUNTER — Other Ambulatory Visit: Payer: Self-pay

## 2019-05-27 ENCOUNTER — Encounter (HOSPITAL_COMMUNITY): Payer: Self-pay

## 2019-05-27 DIAGNOSIS — G360 Neuromyelitis optica [Devic]: Secondary | ICD-10-CM | POA: Diagnosis not present

## 2019-05-27 MED ORDER — SODIUM CHLORIDE 0.9 % IV SOLN
1200.0000 mg | Freq: Once | INTRAVENOUS | Status: AC
  Administered 2019-05-27: 1200 mg via INTRAVENOUS
  Filled 2019-05-27: qty 120

## 2019-05-27 MED ORDER — SODIUM CHLORIDE 0.9 % IV SOLN: Freq: Once | INTRAVENOUS | Status: AC

## 2019-05-27 MED ORDER — NON FORMULARY: 1200.0000 mg | Freq: Once | Status: DC

## 2019-05-31 DIAGNOSIS — R69 Illness, unspecified: Secondary | ICD-10-CM | POA: Diagnosis not present

## 2019-05-31 DIAGNOSIS — Z2821 Immunization not carried out because of patient refusal: Secondary | ICD-10-CM | POA: Diagnosis not present

## 2019-05-31 DIAGNOSIS — Z6822 Body mass index (BMI) 22.0-22.9, adult: Secondary | ICD-10-CM | POA: Diagnosis not present

## 2019-05-31 DIAGNOSIS — Z299 Encounter for prophylactic measures, unspecified: Secondary | ICD-10-CM | POA: Diagnosis not present

## 2019-06-09 ENCOUNTER — Encounter (HOSPITAL_COMMUNITY)
Admission: RE | Admit: 2019-06-09 | Discharge: 2019-06-09 | Disposition: A | Discharge status: Home / Self Care | Payer: Medicare HMO | Source: Ambulatory Visit | Attending: Neurology | Admitting: Neurology

## 2019-06-09 ENCOUNTER — Other Ambulatory Visit: Payer: Self-pay | Admitting: Neurology

## 2019-06-09 ENCOUNTER — Encounter (HOSPITAL_COMMUNITY): Payer: Self-pay

## 2019-06-09 ENCOUNTER — Other Ambulatory Visit: Payer: Self-pay

## 2019-06-09 DIAGNOSIS — G360 Neuromyelitis optica [Devic]: Secondary | ICD-10-CM | POA: Diagnosis not present

## 2019-06-09 MED ORDER — SODIUM CHLORIDE 0.9 % IV SOLN: Freq: Once | INTRAVENOUS | Status: AC

## 2019-06-09 MED ORDER — NON FORMULARY: 1200.0000 mg | Freq: Once | Status: DC

## 2019-06-09 MED ORDER — ECULIZUMAB 300 MG/30ML IV SOLN
1200.0000 mg | Freq: Once | INTRAVENOUS | Status: DC
  Filled 2019-06-09: qty 120

## 2019-06-09 MED ORDER — SODIUM CHLORIDE 0.9 % IV SOLN
1200.0000 mg | Freq: Once | INTRAVENOUS | Status: AC
  Administered 2019-06-09: 1200 mg via INTRAVENOUS
  Filled 2019-06-09: qty 120

## 2019-06-15 ENCOUNTER — Telehealth: Payer: Self-pay | Admitting: Neurology

## 2019-06-15 DIAGNOSIS — G43709 Chronic migraine without aura, not intractable, without status migrainosus: Secondary | ICD-10-CM | POA: Diagnosis not present

## 2019-06-15 DIAGNOSIS — M545 Low back pain: Secondary | ICD-10-CM | POA: Diagnosis not present

## 2019-06-15 DIAGNOSIS — Z79891 Long term (current) use of opiate analgesic: Secondary | ICD-10-CM | POA: Diagnosis not present

## 2019-06-15 DIAGNOSIS — M79605 Pain in left leg: Secondary | ICD-10-CM | POA: Diagnosis not present

## 2019-06-15 NOTE — Telephone Encounter (Signed)
Barton Fanny, NP for Dr Freddie Apley office is asking if on Wed of next week between 9-10 does Dr Felecia Shelling has 10 mins. To discuss this pt. Barton Fanny, NP can be reached at [phone]

## 2019-06-20 ENCOUNTER — Telehealth: Payer: Self-pay | Admitting: Neurology

## 2019-06-20 NOTE — Telephone Encounter (Signed)
error 

## 2019-06-21 NOTE — Telephone Encounter (Signed)
Called pt. Scheduled appt for 06/22/19 at 1:30pm with Dr. Felecia Shelling. Advised her to check in by 1:00pm. She verbalized understanding.

## 2019-06-22 ENCOUNTER — Other Ambulatory Visit: Payer: Self-pay

## 2019-06-22 ENCOUNTER — Encounter: Payer: Self-pay | Admitting: Neurology

## 2019-06-22 ENCOUNTER — Ambulatory Visit: Payer: Medicare HMO | Admitting: Neurology

## 2019-06-22 VITALS — BP 95/60 | HR 74 | Temp 97.7°F | Ht 61.0 in

## 2019-06-22 DIAGNOSIS — M48061 Spinal stenosis, lumbar region without neurogenic claudication: Secondary | ICD-10-CM | POA: Insufficient documentation

## 2019-06-22 DIAGNOSIS — G360 Neuromyelitis optica [Devic]: Secondary | ICD-10-CM | POA: Diagnosis not present

## 2019-06-22 DIAGNOSIS — G8194 Hemiplegia, unspecified affecting left nondominant side: Secondary | ICD-10-CM

## 2019-06-22 DIAGNOSIS — R208 Other disturbances of skin sensation: Secondary | ICD-10-CM | POA: Diagnosis not present

## 2019-06-22 NOTE — Progress Notes (Signed)
[facility]  PATIENT: Susan Hodge DOB: 1950/08/17  REFERRING DOCTOR OR PCP:  [name], NP SOURCE: Patient, daughter, notes from hospital and rehab admissions, multiple imaging and lab reports, MRI images of brain, cervical, thoracic and lumbar spine were reviewed  _________________________________   HISTORICAL  CHIEF COMPLAINT:  Chief Complaint  Patient presents with  . Follow-up    RM 12, with son (temp: 97.8). In [facility] in office today. Last seen 05/11/2019. Recieves IV soliris.   Update 06/22/2019: She is on Soliris for Neuromyelitis optica .  She is receiving the medication closer to her home.  She tolerates it well.  She has not had any relapses since starting.  This compares to several relapses in rapid succession earlier in 2020.  She was experiencing pain in her Lower back that radiated some down the left leg.   She felt much worse after she took Prednisone with lots of spasticity, left > right.   She had an ESI in the past which was difficult with a lot of pain.    I showed her and her son the MRI images explaining what spinal stenosis was.  I had a long conversation with them that if pain worsens she may need to be seen by neurosurgery as she did not get a benefit from epidural steroid injections previously.  She has chronic migraines and takes [employer].  This is helping with less frequency and intensity.    She takes Fioricet as needed.  Update 09/23/2018 (virtual) At the last visit, given the combination of her presentation, spinal cord MRI and anti-NMO antibody, she was diagnosed with neuromyelitis optica.  She appeared to have had a brand-new exacerbation that started a day or 2 before the visit and I gave her 1 day of high-dose IV Solu-Medrol followed by a very high-dose prednisone oral taper.  She feels she improved a lot after the steroids.  Specifically, she is able to walk with her walker again (she had been doing that when discharged from the  hospital but lost that ability a day or 2 before the visit).  Additionally, the left hand weakness and clumsiness which began shortly before the visit has improved though her left arm is still worse than the right arm.  Of note, she is left-handed.  She is able to write again though her handwriting is messy.  She had not been able to write a couple weeks ago.    At that time, we discussed treatment options.  Soliris is FDA approved for neuromyelitis optica and Rituxan is used quite a bit off label.  Because of the COVID-19 pandemic, Soliris would be safer.  She did get the initial meningitis vaccination (both the Energy and the B shots) and she will need boosters next month for the 1 and in 2 months for the other.  Hopefully she will quickly get authorized so we can get her Soliris infusions started.  I discussed with her that since she appears to respond well to the steroids that I will place her on a 20 mg oral dose in the interim though the IV will be much stronger.  Vasculitis lab work performed at the last visit was negative.  Currently, gait is still poor though she is able to use the walker.   She has clumsiness in the left hand though it is much better than 2 weeks ago.  Strength is better in the left hand and arm.  Vision is fine.  She has urinary urgency  and rare urge incontinence (just once since last visit and couldn't get to bathroom in time.  She is on Enablex 7.5 mg and we discussed a higher dose.  She has pain in her feet and her back.   She notes her legs will spasm when she stands up.   She also has a dysesthetic sensation in both feet.  Lyrica has helped that some.   Observations/Objective: She is a well-developed well-nourished woman in no acute distress.  The head is normocephalic and atraumatic.  Sclera are anicteric.  Visible skin appears normal.  The neck has a good range of motion.  Pharynx and tongue have normal appearance.  She is alert and fully oriented with fluent speech and good  attention, knowledge and memory.  Extraocular muscles are intact.  Facial strength is normal.  Palatal elevation and tongue protrusion are midline.  She appears to have normal strength in the right arm and slightly reduced strength in the left arm with reduced left Rapid alternating movements and reduced left finger-nose-finger.  Assessment and Plan: Neuromyelitis optica (devic) (HCC)  Left hemiplegia ([place])  Dysesthesia  Spinal stenosis of lumbar region, unspecified whether neurogenic claudication present  1.   She has neuromyelitis optica and we are trying to get her started on Soliris therapy.  I am concerned that she had her second relapse a few weeks after she completed steroids for her first relapse.  Therefore, I am going to have her start prednisone 20 mg daily to help hold her until we can get authorization for her infusions. 2.   Increase Enablex to 15 mg.  Begin baclofen 10 mg and titrate up to 10 mg 3 times daily for spasticity of central origin. 3.   Stay active and exercise as tolerated.  She will continue to see rehab. 4.   I will see her back for regular visit in 2 months and she will be beginning infusions soon she should call if she has new or worsening neurologic symptoms.  Follow Up Instructions: I discussed the assessment and treatment plan with the patient. The patient was provided an opportunity to ask questions and all were answered. The patient agreed with the plan and demonstrated an understanding of the instructions.    The patient was advised to call back or seek an in-person evaluation if the symptoms worsen or if the condition fails to improve as anticipated.  I provided 25 minutes of non-face-to-face time during this encounter.  __________________________________________________ FROM INITIAL VISIT 09/09/2018: HISTORY OF PRESENT ILLNESS:  I had the pleasure of seeing your patient, Susan Hodge, at [facility] for neurologic consultation regarding  her transverse myelitis.  She began to experience clumsiness in mid March.  She had been paced on topiramate for migraines and had recently and had some dizziness and vertigo initially.  She fell and a day later began to have urinary incontinence and inability to move the left leg.   She went to the ED at [facility] and an MRI of the lumbar spine shoowed degenerative disc changes.    She was prescribed a steroid pack and discharged.   The next day she was worse and her PCP sent her to the [facility] ED.   She had MRI of the brain and cervical and thoracic spine.   The MRI of hte brain showed a remote right MCA stroke (she never had symptoms).   MRI of the spine showed multifocal lesions within the spinal cord, centrally but asymmetric.  She received 5 days of IV Solu-Medrol in the hospital.   She initially improved after the steroids and was using a walker well a couple weeks later at discharge from rehab.  Balance was still poor but strength was good.   Of note she had no arm weakness in March or April.   Over the past couple days, she has felt much weaker with more left leg > arm weakness.  .  She also has had right flank numbness and a dysesthetic sensation (pressure like pain) since yesterday.      Bladder impoved with steroids and is continuing to do well (she is on Enablex).    She notes no change in vision.  She notes some fatigue especially with endurance.  She denies any depression or anxiety.  She had shingles Sep 24, 2017 in the right V1 distribution with ocular and forehead rash.   She has had headaches since that time.   Even before shingles, she was on Lyrica for leg pain.   Even before shingles, she had migraines and was on Depakene.  She is currently on Percocet 7.5 mg 3-4 a day.    Her only loss of vision occurred with the shingles last year and resolved as the rash resolved.   She was diagnosed with SLE in the 1980's but was never on an immunomodulatory agent.   She had a butterfly  rash and abnormal tests.   We don't have any more details.  She also has a long history of migraine headaches that predated her more recent issues.  She is on Eliquis due to a DVT noted during her march admission.     I personally reviewed the MRIs of the brain and spine from March 2020.  The MRI of the brain 07/31/2018 shows a remote right MCA infarction with a small amount of associated hemosiderin.  There are also some scattered T2/flair hyperintense foci consistent with mild chronic microvascular ischemic changes.  There were no acute findings.  The MRI of the cervical spine shows T2 hyperintense signal within the spinal cord posteriorly to the right adjacent to C2, posterior to the left adjacent to C3, and a little more to the right but central adjacent to C7.  The foci adjacent to C3 and C7 does have subtle enhancement after gadolinium was infused.  There are some degenerative changes at C4-C5, C5-C6 and C6-C7 but no definite nerve root compression and no spinal stenosis.   MRI of the thoracic spine shows diffuse patchy T2 hyperintensity that is predominantly central.   Number spine shows the abnormal signal within the spinal cord adjacent to T10 and T11.  There are degenerative changes at L3-L4, L4-L5 and L5-S1.  There is spinal stenosis at L4-L5.  With possibility for left L4 and L5 nerve root  She had lab work during her hospitalization.  The neuromyelitis optica IgG antibody was very positive at 139 (< 3 normal).  Lumbar puncture showed a small elevation of white blood cell count, predominantly lymphocytes and macrophages.  B12, copper were normal.  Herpes simplex virus and HIV were negative.  ACE was normal.  REVIEW OF SYSTEMS: Constitutional: No fevers, chills, sweats, or change in appetite Eyes: No visual changes, double vision, eye pain Ear, nose and throat: No hearing loss, ear pain, nasal congestion, sore throat Cardiovascular: No chest pain, palpitations Respiratory: No shortness of  breath at rest or with exertion.   No wheezes GastrointestinaI: No nausea, vomiting, diarrhea, abdominal pain, fecal incontinence Genitourinary:As above Musculoskeletal:  No neck pain, back pain Integumentary: No rash, pruritus, skin lesions Neurological: as above Psychiatric: No depression at this time.  No anxiety Endocrine: No palpitations, diaphoresis, change in appetite, change in weigh or increased thirst Hematologic/Lymphatic: No anemia, purpura, petechiae. Allergic/Immunologic: No itchy/runny eyes, nasal congestion, recent allergic reactions, rashes  ALLERGIES: Allergies  Allergen Reactions  . Amitriptyline Other (See Comments)  . Bee Venom Other (See Comments)  . Fexofenadine Other (See Comments)  . Furosemide Other (See Comments)  . Gabapentin Other (See Comments)  . Hydroxychloroquine Sulfate Other (See Comments)  . Morphine Other (See Comments)  . Nsaids   . Oxycodone Other (See Comments)    dizzy  . Topiramate Other (See Comments)    HOME MEDICATIONS:  Current Outpatient Medications:  .  acetaminophen (TYLENOL) 325 MG tablet, Take 2 tablets (650 mg total) by mouth every 6 (six) hours as needed for mild pain (or Fever >/= 101)., Disp: , Rfl:  .  ALPRAZolam (XANAX) 0.5 MG tablet, Take 1 tablet (0.5 mg total) by mouth 3 (three) times daily as needed for anxiety or sleep., Disp: 10 tablet, Rfl: 0 .  apixaban (ELIQUIS) 5 MG TABS tablet, Take 1 tablet (5 mg total) by mouth 2 (two) times daily. Hold until Saturday 01/08/2019, Disp: 60 tablet, Rfl: 1 .  baclofen (LIORESAL) 10 MG tablet, TAKE 1 TABLET BY MOUTH THREE TIMES DAILY, Disp: 90 tablet, Rfl: 5 .  Cyanocobalamin (VITAMIN B-12 PO), Take 2 tablets by mouth daily., Disp: , Rfl:  .  darifenacin (ENABLEX) 15 MG 24 hr tablet, TAKE 1 TABLET BY MOUTH DAILY, Disp: 30 tablet, Rfl: 5 .  Eculizumab (SOLIRIS IV), Inject into the vein See admin instructions. 900mg  IV for 4 weeks, 1200mg  at week 5 and then 1200mg  q 2 weeks, Disp: ,  Rfl:  .  lidocaine (LIDODERM) 5 %, Place 1 patch onto the skin every 12 (twelve) hours., Disp: , Rfl:  .  loratadine (CLARITIN) 10 MG tablet, Take 10 mg by mouth daily., Disp: , Rfl:  .  ondansetron (ZOFRAN) 4 MG tablet, Take 1 tablet (4 mg total) by mouth every 6 (six) hours as needed for nausea., Disp: 20 tablet, Rfl: 0 .  oxyCODONE-acetaminophen (PERCOCET) 10-325 MG tablet, Take 1 tablet by mouth every 6 (six) hours as needed., Disp: 10 tablet, Rfl: 0 .  pantoprazole (PROTONIX) 40 MG tablet, Take 1 tablet (40 mg total) by mouth 2 (two) times daily before a meal., Disp: 60 tablet, Rfl: 1 .  polyethylene glycol (MIRALAX) 17 g packet, Take 17 g by mouth every morning., Disp: 30 each, Rfl: 3 .  pregabalin (LYRICA) 50 MG capsule, Take 1 capsule (50 mg total) by mouth 3 (three) times daily. (Patient taking differently: Take 50 mg by mouth 4 (four) times daily. ), Disp: 90 capsule, Rfl: 0 .  senna-docusate (SENOKOT-S) 8.6-50 MG tablet, Take 2 tablets by mouth at bedtime., Disp: 60 tablet, Rfl: 3 .  valproic acid (DEPAKENE) 250 MG capsule, Take 1 capsule (250 mg total) by mouth at bedtime., Disp: 30 capsule, Rfl: 1 .  Vitamin D, Ergocalciferol, (DRISDOL) 1.25 MG (50000 UT) CAPS capsule, Take 1 capsule (50,000 Units total) by mouth every Monday., Disp: 4 capsule, Rfl: 0 .  vitamin E 1000 UNIT capsule, Take 1,000 Units by mouth daily., Disp: , Rfl:   PAST MEDICAL HISTORY: Past Medical History:  Diagnosis Date  . Chest pain   . Collagen vascular disease ([place])   . Complication of anesthesia    Blisters to  mouth.  . Depression   . DJD (degenerative joint disease)   . FH: CVA (cerebrovascular accident)    father  . Fibromyalgia   . GERD (gastroesophageal reflux disease)   . Hypothyroidism   . Lupus ([place])   . Migraines   . Palpitations   . Status post cholecystectomy    appendectomy and bilaateral oophorectomy  . Tobacco use     PAST SURGICAL HISTORY: Past Surgical History:  Procedure  Laterality Date  . BILATERAL OOPHORECTOMY    . CARDIAC CATHETERIZATION    . CHOLECYSTECTOMY    . COLONOSCOPY WITH PROPOFOL N/A 01/03/2019   Procedure: COLONOSCOPY WITH PROPOFOL;  Surgeon: Rogene Houston, MD;  [facility];  Service: Endoscopy;  Laterality: N/A;  . ESOPHAGOGASTRODUODENOSCOPY (EGD) WITH PROPOFOL N/A 01/02/2019   Procedure: ESOPHAGOGASTRODUODENOSCOPY (EGD) WITH PROPOFOL;  Surgeon: Rogene Houston, MD;  [facility]: [facility];  Service: Endoscopy;  Laterality: N/A;  . POLYPECTOMY  01/03/2019   Procedure: POLYPECTOMY;  Surgeon: Rogene Houston, MD;  [facility];  Service: Endoscopy;;    FAMILY HISTORY: Family History  Problem Relation Age of Onset  . Stroke Father     SOCIAL HISTORY:  Social History   Socioeconomic History  . Marital status: Single    Spouse name: Not on file  . Number of children: 3  . Years of education: Not on file  . Highest education level: Not on file  Occupational History  . Occupation: disability  Tobacco Use  . Smoking status: Current Every Day Smoker    Packs/day: 0.50    Types: Cigarettes  . Smokeless tobacco: Never Used  . Tobacco comment: quit last week 01/03/19  Substance and Sexual Activity  . Alcohol use: No  . Drug use: Never  . Sexual activity: Not on file  Other Topics Concern  . Not on file  Social History Narrative   Friend lives with her   Caffeine use: coffee daily   Left handed    Social Determinants of Health   Financial Resource Strain:   . Difficulty of Paying Living Expenses: Not on file  Food Insecurity:   . Worried About Charity fundraiser in the Last Year: Not on file  . Ran Out of Food in the Last Year: Not on file  Transportation Needs:   . Lack of Transportation (Medical): Not on file  . Lack of Transportation (Non-Medical): Not on file  Physical Activity:   . Days of Exercise per Week: Not on file  . Minutes of Exercise per Session: Not on file  Stress:   . Feeling of  Stress : Not on file  Social Connections:   . Frequency of Communication with Friends and Family: Not on file  . Frequency of Social Gatherings with Friends and Family: Not on file  . Attends Religious Services: Not on file  . Active Member of Clubs or Organizations: Not on file  . Attends Archivist Meetings: Not on file  . Marital Status: Not on file  Intimate Partner Violence:   . Fear of Current or Ex-Partner: Not on file  . Emotionally Abused: Not on file  . Physically Abused: Not on file  . Sexually Abused: Not on file     PHYSICAL EXAM  Vitals:   06/22/19 1352  BP: 95/60  Pulse: 74  Temp: 97.7 F (36.5 C)  SpO2: 96%  Height: 5\' 1"  (1.549 m)    Body mass index is 21.91 kg/m.  General: The patient is well-developed and well-nourished and in no acute distress  Neck: The neck is supple, no carotid bruits are noted.  The neck is nontender.  Cardiovascular: The heart has a regular rate and rhythm with a normal S1 and S2. There were no murmurs, gallops or rubs. Lungs are clear to auscultation.  Skin: Extremities are without significant edema.  Musculoskeletal:  Back is nontender  Neurologic Exam  Mental status: The patient is alert and oriented x 3 at the time of the examination. The patient has apparent normal recent and remote memory, with an apparently normal attention span and concentration ability.   Speech is normal.  Cranial nerves: Extraocular movements are full. Pupils are equal, round, and reactive to light and accomodation.    Facial symmetry is present. There is good facial sensation to soft touch bilaterally.Facial strength is normal.  Trapezius and sternocleidomastoid strength is normal. No dysarthria is noted.  No obvious hearing deficits are noted.  Motor:  Muscle bulk is normal.   Mildly increased muscle tone in the left leg.  Strength was 3/5 in the left leg except 4 -/5 in the left quadriceps.  Strength is 4-4+/5 in the right leg.   Strength was 5/5 in the right arm strength is 4/5 in the left arm.   Sensory: She now reports normal sensation to touch and temperature.  However, she has reduced vibration sensation in the left leg.   Coordination: Cerebellar testing reveals good right finger-nose-finger but she is unable to do left finger-nose-finger.  Reduced heel-to-shin, worse on the left.  Gait and station: She cannot stand without bilateral support.  She can walk in place with support.  She becomes much more ataxic with her eyes closed  Reflexes: Deep tendon reflexes are mildly increased in the left leg relative to the right.  No clonus.    DIAGNOSTIC DATA (LABS, IMAGING, TESTING) - I reviewed patient records, labs, notes, testing and imaging myself where available.  Lab Results  Component Value Date   WBC 3.0 (L) 01/01/2019   HGB 7.7 (L) 01/03/2019   HCT 24.9 (L) 01/03/2019   MCV 85.1 01/01/2019   PLT 169 01/01/2019      Component Value Date/Time   NA 138 01/01/2019 0704   K 3.8 01/01/2019 0704   CL 106 01/01/2019 0704   CO2 22 01/01/2019 0704   GLUCOSE 99 01/01/2019 0704   BUN 21 01/01/2019 0704   CREATININE 0.78 01/01/2019 0704   CALCIUM 8.9 01/01/2019 0704   PROT 6.1 (L) 12/31/2018 0915   ALBUMIN 3.2 (L) 12/31/2018 0915   AST 15 12/31/2018 0915   ALT 9 12/31/2018 0915   ALKPHOS 42 12/31/2018 0915   BILITOT 0.5 12/31/2018 0915   GFRNONAA >60 01/01/2019 0704   GFRAA >60 01/01/2019 0704    No results found for: HGBA1C Lab Results  Component Value Date   VITAMINB12 1,457 (H) 08/01/2018   _____________________  Neuromyelitis optica (devic) ([place])  Left hemiplegia ([place])  Dysesthesia  Spinal stenosis of lumbar region, unspecified whether neurogenic claudication present  1.   Continue Soliris for neuromyelitis optica.  She continues to do well. 2.   I advised her to get the vaccination for COVID-19 when available.  This will give her more options for treatment of Devics disease if she  breakthrough while on surveillance.  (Such as Rituxan or [place]) 3.   I discussed her lumbar spine issues.  She has moderately severe spinal stenosis at L4-L5, moderate spinal stenosis at L3-L4  mild spinal stenosis at L5-S1.  It is possible that the lipomatosis induced by her multiple rounds of IV steroids in early 2020 have worsened her symptoms.  She did not get a benefit from epidural steroids in the past.  Therefore, if symptoms worsen again I would recommend that she see neurosurgery. 4.   To simplify her medical regimen, she can stop the Enablex for her bladder.  She knows go back on if bladder symptoms worsen.   5.    She will return to see me in 6 months or sooner if there are new or worsening neurologic symptoms.  40-minute outpatient visit with the majority the time face-to-face discussing her neuromyelitis optica and lumbar spine issues.   Bronson Bressman A. Felecia Shelling, MD, [facility] [id number], [id number] PM Certified in Neurology, Clinical Neurophysiology, Sleep Medicine, Pain Medicine and Neuroimaging  [facility] [address] [place], [place] [address] [phone]

## 2019-06-24 ENCOUNTER — Encounter (HOSPITAL_COMMUNITY)
Admission: RE | Admit: 2019-06-24 | Discharge: 2019-06-24 | Disposition: A | Discharge status: Home / Self Care | Payer: Medicare HMO | Source: Ambulatory Visit | Attending: Neurology | Admitting: Neurology

## 2019-06-24 ENCOUNTER — Encounter (HOSPITAL_COMMUNITY): Payer: Self-pay

## 2019-06-24 ENCOUNTER — Other Ambulatory Visit: Payer: Self-pay

## 2019-06-24 DIAGNOSIS — G360 Neuromyelitis optica [Devic]: Secondary | ICD-10-CM | POA: Diagnosis not present

## 2019-06-24 MED ORDER — SODIUM CHLORIDE 0.9 % IV SOLN: Freq: Once | INTRAVENOUS | Status: AC

## 2019-06-24 MED ORDER — ECULIZUMAB 300 MG/30ML IV SOLN
1200.0000 mg | Freq: Once | INTRAVENOUS | Status: DC
  Filled 2019-06-24: qty 120

## 2019-06-24 MED ORDER — SODIUM CHLORIDE 0.9 % IV SOLN
1200.0000 mg | Freq: Once | INTRAVENOUS | Status: AC
  Administered 2019-06-24: 1200 mg via INTRAVENOUS
  Filled 2019-06-24: qty 120

## 2019-07-08 ENCOUNTER — Encounter (HOSPITAL_COMMUNITY): Payer: Self-pay

## 2019-07-08 ENCOUNTER — Encounter (HOSPITAL_COMMUNITY)
Admission: RE | Admit: 2019-07-08 | Discharge: 2019-07-08 | Disposition: A | Discharge status: Home / Self Care | Payer: Medicare HMO | Source: Ambulatory Visit | Attending: Neurology | Admitting: Neurology

## 2019-07-08 ENCOUNTER — Other Ambulatory Visit: Payer: Self-pay

## 2019-07-08 DIAGNOSIS — G360 Neuromyelitis optica [Devic]: Secondary | ICD-10-CM | POA: Insufficient documentation

## 2019-07-08 MED ORDER — NON FORMULARY: 1200.0000 mg | Freq: Once | Status: DC

## 2019-07-08 MED ORDER — SODIUM CHLORIDE 0.9 % IV SOLN
1200.0000 mg | Freq: Once | INTRAVENOUS | Status: AC
  Administered 2019-07-08: 1200 mg via INTRAVENOUS
  Filled 2019-07-08: qty 120

## 2019-07-08 MED ORDER — SODIUM CHLORIDE 0.9 % IV SOLN: Freq: Once | INTRAVENOUS | Status: AC

## 2019-07-13 DIAGNOSIS — M545 Low back pain: Secondary | ICD-10-CM | POA: Diagnosis not present

## 2019-07-13 DIAGNOSIS — Z79891 Long term (current) use of opiate analgesic: Secondary | ICD-10-CM | POA: Diagnosis not present

## 2019-07-13 DIAGNOSIS — M130 Polyarthritis, unspecified: Secondary | ICD-10-CM | POA: Diagnosis not present

## 2019-07-13 DIAGNOSIS — G43709 Chronic migraine without aura, not intractable, without status migrainosus: Secondary | ICD-10-CM | POA: Diagnosis not present

## 2019-07-13 DIAGNOSIS — R251 Tremor, unspecified: Secondary | ICD-10-CM | POA: Diagnosis not present

## 2019-07-13 DIAGNOSIS — G43711 Chronic migraine without aura, intractable, with status migrainosus: Secondary | ICD-10-CM | POA: Diagnosis not present

## 2019-07-13 DIAGNOSIS — M5416 Radiculopathy, lumbar region: Secondary | ICD-10-CM | POA: Diagnosis not present

## 2019-07-22 ENCOUNTER — Other Ambulatory Visit: Payer: Self-pay

## 2019-07-22 ENCOUNTER — Encounter (HOSPITAL_COMMUNITY): Payer: Self-pay

## 2019-07-22 ENCOUNTER — Encounter (HOSPITAL_COMMUNITY)
Admission: RE | Admit: 2019-07-22 | Discharge: 2019-07-22 | Disposition: A | Discharge status: Home / Self Care | Payer: Medicare HMO | Source: Ambulatory Visit | Attending: Neurology | Admitting: Neurology

## 2019-07-22 DIAGNOSIS — G360 Neuromyelitis optica [Devic]: Secondary | ICD-10-CM | POA: Diagnosis not present

## 2019-07-22 MED ORDER — NON FORMULARY: 1200.0000 mg | Freq: Once | Status: DC

## 2019-07-22 MED ORDER — SODIUM CHLORIDE 0.9 % IV SOLN
1200.0000 mg | Freq: Once | INTRAVENOUS | Status: AC
  Administered 2019-07-22: 1200 mg via INTRAVENOUS
  Filled 2019-07-22: qty 120

## 2019-07-22 MED ORDER — SODIUM CHLORIDE 0.9 % IV SOLN: Freq: Once | INTRAVENOUS | Status: AC

## 2019-08-08 ENCOUNTER — Encounter (HOSPITAL_COMMUNITY): Payer: Medicare HMO

## 2019-08-09 ENCOUNTER — Encounter (HOSPITAL_COMMUNITY): Payer: Self-pay

## 2019-08-09 ENCOUNTER — Other Ambulatory Visit: Payer: Self-pay

## 2019-08-09 ENCOUNTER — Encounter (HOSPITAL_COMMUNITY)
Admission: RE | Admit: 2019-08-09 | Discharge: 2019-08-09 | Disposition: A | Discharge status: Home / Self Care | Payer: Medicare HMO | Source: Ambulatory Visit | Attending: Neurology | Admitting: Neurology

## 2019-08-09 DIAGNOSIS — G360 Neuromyelitis optica [Devic]: Secondary | ICD-10-CM | POA: Insufficient documentation

## 2019-08-09 MED ORDER — SODIUM CHLORIDE 0.9 % IV SOLN: Freq: Once | INTRAVENOUS | Status: AC

## 2019-08-09 MED ORDER — SODIUM CHLORIDE 0.9 % IV SOLN
1200.0000 mg | Freq: Once | INTRAVENOUS | Status: AC
  Administered 2019-08-09: 1200 mg via INTRAVENOUS
  Filled 2019-08-09: qty 120

## 2019-08-09 MED ORDER — NON FORMULARY: 1200.0000 mg | Freq: Once | Status: DC

## 2019-08-10 ENCOUNTER — Ambulatory Visit: Payer: Medicare HMO | Admitting: Neurology

## 2019-08-16 ENCOUNTER — Telehealth: Payer: Self-pay | Admitting: Neurology

## 2019-08-16 NOTE — Telephone Encounter (Signed)
Received fax from [place] that [place] approved 09/25/2019-03/27/2020. Auth #[id number]. [id number]. I emailed Renda Rolls at [employer] to notify her of approval.

## 2019-08-16 NOTE — Telephone Encounter (Signed)
Rep with [facility] called to advise pt soliris will need to be renewed

## 2019-08-16 NOTE — Telephone Encounter (Signed)
PA submitted on CMM. [id number]. Waiting on determination from [place] for [facility] Form

## 2019-08-16 NOTE — Telephone Encounter (Signed)
Noted, we will work on this. I pulled previous approval via [employer]. Approved until 09/24/19.

## 2019-08-23 ENCOUNTER — Encounter (HOSPITAL_COMMUNITY)
Admission: RE | Admit: 2019-08-23 | Discharge: 2019-08-23 | Disposition: A | Discharge status: Home / Self Care | Payer: Medicare HMO | Source: Ambulatory Visit | Attending: Neurology | Admitting: Neurology

## 2019-08-23 ENCOUNTER — Other Ambulatory Visit: Payer: Self-pay

## 2019-08-23 DIAGNOSIS — G360 Neuromyelitis optica [Devic]: Secondary | ICD-10-CM | POA: Diagnosis not present

## 2019-08-23 MED ORDER — SODIUM CHLORIDE 0.9 % IV SOLN: Freq: Once | INTRAVENOUS | Status: AC

## 2019-08-23 MED ORDER — NON FORMULARY: 1200.0000 mg | Freq: Once | Status: DC

## 2019-08-23 MED ORDER — SODIUM CHLORIDE 0.9 % IV SOLN
1200.0000 mg | Freq: Once | INTRAVENOUS | Status: AC
  Administered 2019-08-23: 14:00:00 1200 mg via INTRAVENOUS
  Filled 2019-08-23: qty 120

## 2019-09-06 ENCOUNTER — Encounter (HOSPITAL_COMMUNITY): Payer: Medicare HMO

## 2019-09-07 DIAGNOSIS — Z79891 Long term (current) use of opiate analgesic: Secondary | ICD-10-CM | POA: Diagnosis not present

## 2019-09-07 DIAGNOSIS — M130 Polyarthritis, unspecified: Secondary | ICD-10-CM | POA: Diagnosis not present

## 2019-09-07 DIAGNOSIS — M545 Low back pain: Secondary | ICD-10-CM | POA: Diagnosis not present

## 2019-09-07 DIAGNOSIS — G43709 Chronic migraine without aura, not intractable, without status migrainosus: Secondary | ICD-10-CM | POA: Diagnosis not present

## 2019-09-07 DIAGNOSIS — R252 Cramp and spasm: Secondary | ICD-10-CM | POA: Diagnosis not present

## 2019-09-07 DIAGNOSIS — G43711 Chronic migraine without aura, intractable, with status migrainosus: Secondary | ICD-10-CM | POA: Diagnosis not present

## 2019-09-07 DIAGNOSIS — M5416 Radiculopathy, lumbar region: Secondary | ICD-10-CM | POA: Diagnosis not present

## 2019-09-07 DIAGNOSIS — R251 Tremor, unspecified: Secondary | ICD-10-CM | POA: Diagnosis not present

## 2019-09-09 ENCOUNTER — Other Ambulatory Visit: Payer: Self-pay

## 2019-09-09 ENCOUNTER — Encounter (HOSPITAL_COMMUNITY)
Admission: RE | Admit: 2019-09-09 | Discharge: 2019-09-09 | Disposition: A | Discharge status: Home / Self Care | Payer: Medicare HMO | Source: Ambulatory Visit | Attending: Neurology | Admitting: Neurology

## 2019-09-09 ENCOUNTER — Encounter (HOSPITAL_COMMUNITY): Payer: Self-pay

## 2019-09-09 DIAGNOSIS — G360 Neuromyelitis optica [Devic]: Secondary | ICD-10-CM | POA: Insufficient documentation

## 2019-09-09 MED ORDER — SODIUM CHLORIDE 0.9 % IV SOLN
1200.0000 mg | Freq: Once | INTRAVENOUS | Status: AC
  Administered 2019-09-09: 1200 mg via INTRAVENOUS
  Filled 2019-09-09: qty 120

## 2019-09-09 MED ORDER — NON FORMULARY: 1200.0000 mg | Freq: Once | Status: DC

## 2019-09-09 MED ORDER — SODIUM CHLORIDE 0.9 % IV SOLN: Freq: Once | INTRAVENOUS | Status: AC

## 2019-09-12 ENCOUNTER — Telehealth: Payer: Self-pay | Admitting: Neurology

## 2019-09-12 NOTE — Telephone Encounter (Signed)
Called, LVM asking pt call officel. Wanted to verify she is currently taking baclofen 10mg  by mouth three times daily.

## 2019-09-12 NOTE — Telephone Encounter (Signed)
Pt called wanting to speak to RN about her baclofen (LIORESAL) 10 MG tablet she is wanting to know if the dosage can be increased. Please advise.

## 2019-09-13 ENCOUNTER — Other Ambulatory Visit: Payer: Self-pay | Admitting: *Deleted

## 2019-09-13 MED ORDER — BACLOFEN 10 MG PO TABS: 10.0000 mg | ORAL_TABLET | Freq: Every day | ORAL | 5 refills | Status: DC

## 2019-09-13 NOTE — Telephone Encounter (Signed)
Called pt. Relayed per Dr. Felecia Shelling that she can increase to 5 times/day. I called in new rx to [facility]. Pt verbalized understanding.

## 2019-09-13 NOTE — Telephone Encounter (Signed)
At 3:14p.m. Pt called back to inform Terrence Dupont, RN that she takes the Baclofen 4 times a day.

## 2019-09-13 NOTE — Telephone Encounter (Signed)
Called, LVM again for pt

## 2019-09-20 MED ORDER — TIZANIDINE HCL 2 MG PO TABS: 2.0000 mg | ORAL_TABLET | Freq: Three times a day (TID) | ORAL | 3 refills | Status: DC

## 2019-09-20 NOTE — Telephone Encounter (Signed)
Pt called to inform the medication baclofen is causing her to have constipation issues and would like a cb from rn to discuss

## 2019-09-20 NOTE — Addendum Note (Signed)
Addended by: Wyvonnia Lora on: 09/20/2019 12:56 PM   Modules accepted: Orders

## 2019-09-20 NOTE — Telephone Encounter (Addendum)
Spoke with Dr. Felecia Shelling. Recommends Miralax po qhs if she is taking only prn currently. Or if she prefers, she can go back to 4x/day of baclofen and add tizanidine 2 mg po tid.    Called pt. She is already taking miralax and stool softener daily. She would like to try going back to four times daily of baclofen and adding the tizanidine. I e-scribed tizanidine to [facility] per pt request. She will call back if she has any future questions/concerns.

## 2019-09-23 ENCOUNTER — Encounter (HOSPITAL_COMMUNITY)
Admission: RE | Admit: 2019-09-23 | Discharge: 2019-09-23 | Disposition: A | Discharge status: Home / Self Care | Payer: Medicare HMO | Source: Ambulatory Visit | Attending: Neurology | Admitting: Neurology

## 2019-09-23 ENCOUNTER — Encounter (HOSPITAL_COMMUNITY): Payer: Self-pay

## 2019-09-23 ENCOUNTER — Other Ambulatory Visit: Payer: Self-pay

## 2019-09-23 DIAGNOSIS — G360 Neuromyelitis optica [Devic]: Secondary | ICD-10-CM | POA: Diagnosis not present

## 2019-09-23 MED ORDER — SODIUM CHLORIDE 0.9 % IV SOLN
1200.0000 mg | Freq: Once | INTRAVENOUS | Status: AC
  Administered 2019-09-23: 1200 mg via INTRAVENOUS
  Filled 2019-09-23: qty 120

## 2019-09-23 MED ORDER — SODIUM CHLORIDE 0.9 % IV SOLN: Freq: Once | INTRAVENOUS | Status: AC

## 2019-09-23 MED ORDER — NON FORMULARY: 1200.0000 mg | Freq: Once | Status: DC

## 2019-09-26 ENCOUNTER — Telehealth: Payer: Self-pay | Admitting: Neurology

## 2019-09-26 NOTE — Telephone Encounter (Signed)
She should stop the tizanidine.  Continue baclofen.  Add methocarbamol 500 mg 3 times daily #90 #5

## 2019-09-26 NOTE — Telephone Encounter (Signed)
Called pt back to further discuss. She reports baclofen and tizanidine making her sick to her stomach, weak in the legs, not able to sleep well. Feels tizanidine (Take 1 tablet (2 mg total) by mouth in the morning, at noon, and at bedtime) made her spasms worse. She took it for 2 days and then stopped (it was called in on 09/20/19). She had 5 bad episodes of spasms yesterday. Confirmed she is still taking baclofen 10mg  po QID.  She wants to know what Dr. Felecia Shelling recommends. Advised I will call her back tomorrow at the latest with a recommendation. She verbalized understanding.

## 2019-09-26 NOTE — Telephone Encounter (Signed)
Pt called wanting to discuss her medications with RN or provider. Pt states they are not working for her. Please advise.

## 2019-09-27 ENCOUNTER — Other Ambulatory Visit: Payer: Self-pay | Admitting: *Deleted

## 2019-09-27 MED ORDER — METHOCARBAMOL 500 MG PO TABS: 500.0000 mg | ORAL_TABLET | Freq: Three times a day (TID) | ORAL | 5 refills | Status: DC

## 2019-09-27 NOTE — Telephone Encounter (Signed)
Called pt again. Relayed Dr. Garth Bigness recommendation. She is agreeable to this. I e-scribed rx methocarbamol. She will d/c tizanidine.

## 2019-09-27 NOTE — Telephone Encounter (Signed)
Called, LVM for pt to call office. 

## 2019-09-30 ENCOUNTER — Emergency Department (HOSPITAL_COMMUNITY)
Admission: EM | Admit: 2019-09-30 | Discharge: 2019-09-30 | Disposition: A | Discharge status: Home / Self Care | Payer: Medicare HMO

## 2019-09-30 DIAGNOSIS — R52 Pain, unspecified: Secondary | ICD-10-CM | POA: Diagnosis not present

## 2019-10-04 ENCOUNTER — Encounter: Payer: Self-pay | Admitting: Family Medicine

## 2019-10-04 ENCOUNTER — Other Ambulatory Visit: Payer: Self-pay

## 2019-10-04 ENCOUNTER — Ambulatory Visit: Payer: Medicare HMO | Admitting: Family Medicine

## 2019-10-04 VITALS — BP 115/78 | HR 78 | Ht 60.0 in | Wt 118.0 lb

## 2019-10-04 DIAGNOSIS — M62838 Other muscle spasm: Secondary | ICD-10-CM

## 2019-10-04 DIAGNOSIS — R300 Dysuria: Secondary | ICD-10-CM

## 2019-10-04 DIAGNOSIS — G360 Neuromyelitis optica [Devic]: Secondary | ICD-10-CM

## 2019-10-04 DIAGNOSIS — R29898 Other symptoms and signs involving the musculoskeletal system: Secondary | ICD-10-CM | POA: Diagnosis not present

## 2019-10-04 MED ORDER — LEVETIRACETAM 500 MG PO TABS: 500.0000 mg | ORAL_TABLET | Freq: Two times a day (BID) | ORAL | 11 refills | Status: DC

## 2019-10-04 NOTE — Progress Notes (Signed)
I have read the note, and I agree with the clinical assessment and plan.  Chayil Gantt A. Japji Kok, MD, PhD, FAAN Certified in Neurology, Clinical Neurophysiology, Sleep Medicine, Pain Medicine and Neuroimaging  [facility] [address], [address] [place], [place] [address] [phone]  

## 2019-10-04 NOTE — Patient Instructions (Signed)
We will continue baclofen 10mg  three times a day. We will add levetiracetam 500mg  twice daily. We will order labs today and repeat cervical MRI.   Stay well hydrated. Fall precautions.   Levetiracetam tablets What is this medicine? LEVETIRACETAM (lee ve tye RA se tam) is an antiepileptic drug. It is used with other medicines to treat certain types of seizures. This medicine may be used for other purposes; ask your health care provider or pharmacist if you have questions. COMMON BRAND NAME(S): Keppra, Roweepra What should I tell my health care provider before I take this medicine? They need to know if you have any of these conditions:  kidney disease  suicidal thoughts, plans, or attempt; a previous suicide attempt by you or a family member  an unusual or allergic reaction to levetiracetam, other medicines, foods, dyes, or preservatives  pregnant or trying to get pregnant  breast-feeding How should I use this medicine? Take this medicine by mouth with a glass of water. Follow the directions on the prescription label. Swallow the tablets whole. Do not crush or chew this medicine. You may take this medicine with or without food. Take your doses at regular intervals. Do not take your medicine more often than directed. Do not stop taking this medicine or any of your seizure medicines unless instructed by your doctor or health care professional. Stopping your medicine suddenly can increase your seizures or their severity. A special MedGuide will be given to you by the pharmacist with each prescription and refill. Be sure to read this information carefully each time. Contact your pediatrician or health care professional regarding the use of this medication in children. While this drug may be prescribed for children as young as 6 years of age for selected conditions, precautions do apply. Overdosage: If you think you have taken too much of this medicine contact a poison control center or emergency  room at once. NOTE: This medicine is only for you. Do not share this medicine with others. What if I miss a dose? If you miss a dose, take it as soon as you can. If it is almost time for your next dose, take only that dose. Do not take double or extra doses. What may interact with this medicine? This medicine may interact with the following medications:  carbamazepine  colesevelam  probenecid  sevelamer This list may not describe all possible interactions. Give your health care provider a list of all the medicines, herbs, non-prescription drugs, or dietary supplements you use. Also tell them if you smoke, drink alcohol, or use illegal drugs. Some items may interact with your medicine. What should I watch for while using this medicine? Visit your doctor or health care provider for a regular check on your progress. Wear a medical identification bracelet or chain to say you have epilepsy, and carry a card that lists all your medications. This medicine may cause serious skin reactions. They can happen weeks to months after starting the medicine. Contact your health care provider right away if you notice fevers or flu-like symptoms with a rash. The rash may be red or purple and then turn into blisters or peeling of the skin. Or, you might notice a red rash with swelling of the face, lips or lymph nodes in your neck or under your arms. It is important to take this medicine exactly as instructed by your health care provider. When first starting treatment, your dose may need to be adjusted. It may take weeks or months before your dose  is stable. You should contact your doctor or health care provider if your seizures get worse or if you have any new types of seizures. You may get drowsy or dizzy. Do not drive, use machinery, or do anything that needs mental alertness until you know how this medicine affects you. Do not stand or sit up quickly, especially if you are an older patient. This reduces the risk  of dizzy or fainting spells. Alcohol may interfere with the effect of this medicine. Avoid alcoholic drinks. The use of this medicine may increase the chance of suicidal thoughts or actions. Pay special attention to how you are responding while on this medicine. Any worsening of mood, or thoughts of suicide or dying should be reported to your health care provider right away. Women who become pregnant while using this medicine may enroll in the [place] Pregnancy Registry by calling [phone]. This registry collects information about the safety of antiepileptic drug use during pregnancy. What side effects may I notice from receiving this medicine? Side effects that you should report to your doctor or health care professional as soon as possible:  allergic reactions like skin rash, itching or hives, swelling of the face, lips, or tongue  breathing problems  dark urine  general ill feeling or flu-like symptoms  problems with balance, talking, walking  rash, fever, and swollen lymph nodes  redness, blistering, peeling or loosening of the skin, including inside the mouth  unusually weak or tired  worsening of mood, thoughts or actions of suicide or dying  yellowing of the eyes or skin Side effects that usually do not require medical attention (report to your doctor or health care professional if they continue or are bothersome):  diarrhea  dizzy, drowsy  headache  loss of appetite This list may not describe all possible side effects. Call your doctor for medical advice about side effects. You may report side effects to FDA at 1-800-FDA-1088. Where should I keep my medicine? Keep out of reach of children. Store at room temperature between 15 and 30 degrees C (59 and 86 degrees F). Throw away any unused medicine after the expiration date. NOTE: This sheet is a summary. It may not cover all possible information. If you have questions about this medicine,  talk to your doctor, pharmacist, or health care provider.  2020 [employer] Standard (2018-07-23 15:23:36)

## 2019-10-04 NOTE — Progress Notes (Signed)
PATIENT: Susan Hodge DOB: [birth year]  REASON FOR VISIT: follow up HISTORY FROM: patient  Chief Complaint  Patient presents with  . Follow-up    rm 2, with daughter in law     HISTORY OF PRESENT ILLNESS: Today 10/04/19 Susan Hodge is a 69 y.o. female here today with concerns of weakness and fatigue for . She continues IV Solaris for NMO following a history of multiple relapses in rapid succession early 2020. Last infusion was two weeks ago.   She started having more spasms and left sided weakness about 3-4 weeks ago. Baclofen was increased to 4 times daily and tizanidine added. She felt that this made spasms worse.  Tizanidine was stopped and methocarbamol was added on 5/24. Again, she felt that spasms were worse. She continued baclofen only. She reports that over the weekend she started having worsening spasms. She went to spend the night with her son. She noticed that she was having 8-10 "spasms" of left left arm and left leg. Her fingers would "draw in". She feels that her left leg is weaker than normal. She has residual numbness from left foot to just below knee. This is unchanged. She has noticed that it is harder to walk. She is using her wheelchair but feels it is harder to move her left leg. She feels really drained following spasms. She is conscious through spasm. No LOC or confusion. Having left sided flank pain. She has also had nausea for the past few weeks. She has taken ondansetron 4mg  regularly which helps. She noticed that she was burning with urination about 3 days ago but she reports that this can come and go. She denies frequency. No hematuria. She has had regular bowel movements but feels that she is not "going enough." Stool is soft, no blood. NO vision changes. No falls. NO fever, chills or viral symptoms.    MRI Brain 12/2018: IMPRESSION: 1.  No acute intracranial abnormality. 2. Stable MRI appearance of the brain since March with chronic anterior division  right MCA infarct and subtle Wallerian degeneration. 3. Total Spine MRI today reported separately. 4. Chronic nodular appearance of the parotid gland suggesting chronic/recurrent salivary gland inflammation, such as Sjogren Syndrome.  MRI Cervical/Thoracic Spine 12/2018: IMPRESSION: 1. Mixed appearance of the cervical and thoracic spinal cord lesions since March: - new/progressed cord lesion at the C2-C3 level. - regressed lower cervical cord signal abnormality. - progressed in near complete thoracic cord STIR signal abnormality now, although the cord appears less expanded and perhaps with a degree of myelomalacia in the mid thoracic cord. 2. The conus medullaris seems to remain spared. 3. Today there is no abnormal cervical or thoracic cord enhancement. 4. Given the loss of both enhancement and cord expansion metastatic or neoplastic etiologies seem excluded. This seems to be an inflammatory process with a lack of involvement in the brain. Top differential considerations include: Lupus, Sjogren syndrome (note parotid abnormality on brain MRIs), Behcet disease, Neurosarcoidosis. Less likely considerations include multiple sclerosis, other autoimmune etiologies, and spinal cord ischemia. 5. Superimposed cervical spine degeneration with multilevel mild spinal stenosis which appears stable since March. 6. Thoracic disc and endplate degeneration without spinal stenosis.   HISTORY: (copied from Dr Garth Bigness note on 06/22/2019)  She is on Soliris for Neuromyelitis optica .  She is receiving the medication closer to her home.  She tolerates it well.  She has not had any relapses since starting.  This compares to several relapses in rapid succession earlier in  2020.  She was experiencing pain in her Lower back that radiated some down the left leg.   She felt much worse after she took Prednisone with lots of spasticity, left > right.   She had an ESI in the past which was difficult with a  lot of pain.    I showed her and her son the MRI images explaining what spinal stenosis was.  I had a long conversation with them that if pain worsens she may need to be seen by neurosurgery as she did not get a benefit from epidural steroid injections previously.  She has chronic migraines and takes [employer].  This is helping with less frequency and intensity.    She takes Fioricet as needed.  Update 09/23/2018 (virtual) At the last visit, given the combination of her presentation, spinal cord MRI and anti-NMO antibody, she was diagnosed with neuromyelitis optica.  She appeared to have had a brand-new exacerbation that started a day or 2 before the visit and I gave her 1 day of high-dose IV Solu-Medrol followed by a very high-dose prednisone oral taper.  She feels she improved a lot after the steroids.  Specifically, she is able to walk with her walker again (she had been doing that when discharged from the hospital but lost that ability a day or 2 before the visit).  Additionally, the left hand weakness and clumsiness which began shortly before the visit has improved though her left arm is still worse than the right arm.  Of note, she is left-handed.  She is able to write again though her handwriting is messy.  She had not been able to write a couple weeks ago.    At that time, we discussed treatment options.  Soliris is FDA approved for neuromyelitis optica and Rituxan is used quite a bit off label.  Because of the COVID-19 pandemic, Soliris would be safer.  She did get the initial meningitis vaccination (both the [place] and the B shots) and she will need boosters next month for the 1 and in 2 months for the other.  Hopefully she will quickly get authorized so we can get her Soliris infusions started.  I discussed with her that since she appears to respond well to the steroids that I will place her on a 20 mg oral dose in the interim though the IV will be much stronger.  Vasculitis lab work performed at the  last visit was negative.  Currently, gait is still poor though she is able to use the walker.   She has clumsiness in the left hand though it is much better than 2 weeks ago.  Strength is better in the left hand and arm.  Vision is fine.  She has urinary urgency and rare urge incontinence (just once since last visit and couldn't get to bathroom in time.  She is on Enablex 7.5 mg and we discussed a higher dose.  She has pain in her feet and her back.   She notes her legs will spasm when she stands up.   She also has a dysesthetic sensation in both feet.  Lyrica has helped that some.   Observations/Objective: She is a well-developed well-nourished woman in no acute distress.  The head is normocephalic and atraumatic.  Sclera are anicteric.  Visible skin appears normal.  The neck has a good range of motion.  Pharynx and tongue have normal appearance.  She is alert and fully oriented with fluent speech and good attention, knowledge and memory.  Extraocular muscles are intact.  Facial strength is normal.  Palatal elevation and tongue protrusion are midline.  She appears to have normal strength in the right arm and slightly reduced strength in the left arm with reduced left Rapid alternating movements and reduced left finger-nose-finger.  Assessment and Plan: Neuromyelitis optica (devic) (HCC)  Left hemiplegia ([place])  Dysesthesia  Spinal stenosis of lumbar region, unspecified whether neurogenic claudication present  1.   She has neuromyelitis optica and we are trying to get her started on Soliris therapy.  I am concerned that she had her second relapse a few weeks after she completed steroids for her first relapse.  Therefore, I am going to have her start prednisone 20 mg daily to help hold her until we can get authorization for her infusions. 2.   Increase Enablex to 15 mg.  Begin baclofen 10 mg and titrate up to 10 mg 3 times daily for spasticity of central origin. 3.   Stay active and exercise  as tolerated.  She will continue to see rehab. 4.   I will see her back for regular visit in 2 months and she will be beginning infusions soon she should call if she has new or worsening neurologic symptoms.  Follow Up Instructions: I discussed the assessment and treatment plan with the patient. The patient was provided an opportunity to ask questions and all were answered. The patient agreed with the plan and demonstrated an understanding of the instructions.    The patient was advised to call back or seek an in-person evaluation if the symptoms worsen or if the condition fails to improve as anticipated.  I provided 25 minutes of non-face-to-face time during this encounter.  __________________________________________________ FROM INITIAL VISIT 09/09/2018: HISTORY OF PRESENT ILLNESS:  I had the pleasure of seeing your patient, Susan Hodge, at [facility] for neurologic consultation regarding her transverse myelitis.  She began to experience clumsiness in mid March.  She had been paced on topiramate for migraines and had recently and had some dizziness and vertigo initially.  She fell and a day later began to have urinary incontinence and inability to move the left leg.   She went to the ED at [facility] and an MRI of the lumbar spine shoowed degenerative disc changes.    She was prescribed a steroid pack and discharged.   The next day she was worse and her PCP sent her to the [facility] ED.   She had MRI of the brain and cervical and thoracic spine.   The MRI of hte brain showed a remote right MCA stroke (she never had symptoms).   MRI of the spine showed multifocal lesions within the spinal cord, centrally but asymmetric.      She received 5 days of IV Solu-Medrol in the hospital.   She initially improved after the steroids and was using a walker well a couple weeks later at discharge from rehab.  Balance was still poor but strength was good.   Of note she had no arm weakness  in March or April.   Over the past couple days, she has felt much weaker with more left leg > arm weakness.  .  She also has had right flank numbness and a dysesthetic sensation (pressure like pain) since yesterday.      Bladder impoved with steroids and is continuing to do well (she is on Enablex).    She notes no change in vision.  She notes some fatigue especially with endurance.  She denies any depression or anxiety.  She had shingles Sep 24, 2017 in the right V1 distribution with ocular and forehead rash.   She has had headaches since that time.   Even before shingles, she was on Lyrica for leg pain.   Even before shingles, she had migraines and was on Depakene.  She is currently on Percocet 7.5 mg 3-4 a day.    Her only loss of vision occurred with the shingles last year and resolved as the rash resolved.   She was diagnosed with SLE in the 1980's but was never on an immunomodulatory agent.   She had a butterfly rash and abnormal tests.   We don't have any more details.  She also has a long history of migraine headaches that predated her more recent issues.  She is on Eliquis due to a DVT noted during her march admission.     I personally reviewed the MRIs of the brain and spine from March 2020.  The MRI of the brain 07/31/2018 shows a remote right MCA infarction with a small amount of associated hemosiderin.  There are also some scattered T2/flair hyperintense foci consistent with mild chronic microvascular ischemic changes.  There were no acute findings.  The MRI of the cervical spine shows T2 hyperintense signal within the spinal cord posteriorly to the right adjacent to C2, posterior to the left adjacent to C3, and a little more to the right but central adjacent to C7.  The foci adjacent to C3 and C7 does have subtle enhancement after gadolinium was infused.  There are some degenerative changes at C4-C5, C5-C6 and C6-C7 but no definite nerve root compression and no spinal stenosis.   MRI of  the thoracic spine shows diffuse patchy T2 hyperintensity that is predominantly central.   Number spine shows the abnormal signal within the spinal cord adjacent to T10 and T11.  There are degenerative changes at L3-L4, L4-L5 and L5-S1.  There is spinal stenosis at L4-L5.  With possibility for left L4 and L5 nerve root  She had lab work during her hospitalization.  The neuromyelitis optica IgG antibody was very positive at 139 (< 3 normal).  Lumbar puncture showed a small elevation of white blood cell count, predominantly lymphocytes and macrophages.  B12, copper were normal.  Herpes simplex virus and HIV were negative.  ACE was normal.   REVIEW OF SYSTEMS: Out of a complete 14 system review of symptoms, the patient complains only of the following symptoms, muscle spasms, weakness, dysuria, left flank pain and all other reviewed systems are negative.  ALLERGIES: Allergies  Allergen Reactions  . Prednisone Other (See Comments)    Not to be given at any point while patient is still taking Soliris infusions.  Severe shaking and dehydration.  . Amitriptyline Other (See Comments)  . Bee Venom Other (See Comments)  . Fexofenadine Other (See Comments)  . Furosemide Other (See Comments)  . Gabapentin Other (See Comments)  . Hydroxychloroquine Sulfate Other (See Comments)  . Morphine Other (See Comments)  . Nsaids   . Oxycodone Other (See Comments)    dizzy  . Topiramate Other (See Comments)    HOME MEDICATIONS: Outpatient Medications Prior to Visit  Medication Sig Dispense Refill  . acetaminophen (TYLENOL) 325 MG tablet Take 2 tablets (650 mg total) by mouth every 6 (six) hours as needed for mild pain (or Fever >/= 101).    Marland Kitchen ALPRAZolam (XANAX) 0.5 MG tablet Take 1 tablet (0.5 mg total) by mouth 3 (  three) times daily as needed for anxiety or sleep. 10 tablet 0  . apixaban (ELIQUIS) 5 MG TABS tablet Take 1 tablet (5 mg total) by mouth 2 (two) times daily. Hold until Saturday 01/08/2019 60 tablet  1  . baclofen (LIORESAL) 10 MG tablet Take 1 tablet (10 mg total) by mouth 5 (five) times daily. (Patient taking differently: Take 10 mg by mouth 4 (four) times daily. Could not tolerate five times daily--caused constipation) 150 tablet 5  . Cyanocobalamin (VITAMIN B-12 PO) Take 2 tablets by mouth daily.    Marland Kitchen darifenacin (ENABLEX) 15 MG 24 hr tablet TAKE 1 TABLET BY MOUTH DAILY 30 tablet 5  . Eculizumab (SOLIRIS IV) Inject into the vein See admin instructions. 900mg  IV for 4 weeks, 1200mg  at week 5 and then 1200mg  q 2 weeks    . lidocaine (LIDODERM) 5 % Place 1 patch onto the skin every 12 (twelve) hours.    Marland Kitchen loratadine (CLARITIN) 10 MG tablet Take 10 mg by mouth daily.    . methocarbamol (ROBAXIN) 500 MG tablet Take 1 tablet (500 mg total) by mouth 3 (three) times daily. 90 tablet 5  . ondansetron (ZOFRAN) 4 MG tablet Take 1 tablet (4 mg total) by mouth every 6 (six) hours as needed for nausea. 20 tablet 0  . oxyCODONE-acetaminophen (PERCOCET) 10-325 MG tablet Take 1 tablet by mouth every 6 (six) hours as needed. 10 tablet 0  . pantoprazole (PROTONIX) 40 MG tablet Take 1 tablet (40 mg total) by mouth 2 (two) times daily before a meal. 60 tablet 1  . polyethylene glycol (MIRALAX) 17 g packet Take 17 g by mouth every morning. 30 each 3  . senna-docusate (SENOKOT-S) 8.6-50 MG tablet Take 2 tablets by mouth at bedtime. 60 tablet 3  . valproic acid (DEPAKENE) 250 MG capsule Take 1 capsule (250 mg total) by mouth at bedtime. 30 capsule 1  . Vitamin D, Ergocalciferol, (DRISDOL) 1.25 MG (50000 UT) CAPS capsule Take 1 capsule (50,000 Units total) by mouth every Monday. 4 capsule 0  . vitamin E 1000 UNIT capsule Take 1,000 Units by mouth daily.    . pregabalin (LYRICA) 50 MG capsule Take 1 capsule (50 mg total) by mouth 3 (three) times daily. (Patient taking differently: Take 50 mg by mouth 4 (four) times daily. ) 90 capsule 0   No facility-administered medications prior to visit.    PAST MEDICAL  HISTORY: Past Medical History:  Diagnosis Date  . Chest pain   . Collagen vascular disease ([place])   . Complication of anesthesia    Blisters to mouth.  . Depression   . DJD (degenerative joint disease)   . FH: CVA (cerebrovascular accident)    father  . Fibromyalgia   . GERD (gastroesophageal reflux disease)   . Hypothyroidism   . Lupus ([place])   . Migraines   . Palpitations   . Status post cholecystectomy    appendectomy and bilaateral oophorectomy  . Tobacco use     PAST SURGICAL HISTORY: Past Surgical History:  Procedure Laterality Date  . BILATERAL OOPHORECTOMY    . CARDIAC CATHETERIZATION    . CHOLECYSTECTOMY    . COLONOSCOPY WITH PROPOFOL N/A 01/03/2019   Procedure: COLONOSCOPY WITH PROPOFOL;  Surgeon: Rogene Houston, MD;  Location: [facility];  Service: Endoscopy;  Laterality: N/A;  . ESOPHAGOGASTRODUODENOSCOPY (EGD) WITH PROPOFOL N/A 01/02/2019   Procedure: ESOPHAGOGASTRODUODENOSCOPY (EGD) WITH PROPOFOL;  Surgeon: Rogene Houston, MD;  Location: [facility];  Service: Endoscopy;  Laterality: N/A;  . POLYPECTOMY  01/03/2019   Procedure: POLYPECTOMY;  Surgeon: Rogene Houston, MD;  Location: [facility];  Service: Endoscopy;;    FAMILY HISTORY: Family History  Problem Relation Age of Onset  . Stroke Father     SOCIAL HISTORY: Social History   Socioeconomic History  . Marital status: Single    Spouse name: Not on file  . Number of children: 3  . Years of education: Not on file  . Highest education level: Not on file  Occupational History  . Occupation: disability  Tobacco Use  . Smoking status: Current Every Day Smoker    Packs/day: 0.50    Types: Cigarettes  . Smokeless tobacco: Never Used  . Tobacco comment: quit last week 01/03/19  Substance and Sexual Activity  . Alcohol use: No  . Drug use: Never  . Sexual activity: Not on file  Other Topics Concern  . Not on file  Social History Narrative   Friend lives with her   Caffeine use: coffee  daily   Left handed    Social Determinants of Health   Financial Resource Strain:   . Difficulty of Paying Living Expenses:   Food Insecurity:   . Worried About Charity fundraiser in the Last Year:   . Arboriculturist in the Last Year:   Transportation Needs:   . Film/video editor (Medical):   Marland Kitchen Lack of Transportation (Non-Medical):   Physical Activity:   . Days of Exercise per Week:   . Minutes of Exercise per Session:   Stress:   . Feeling of Stress :   Social Connections:   . Frequency of Communication with Friends and Family:   . Frequency of Social Gatherings with Friends and Family:   . Attends Religious Services:   . Active Member of Clubs or Organizations:   . Attends Archivist Meetings:   Marland Kitchen Marital Status:   Intimate Partner Violence:   . Fear of Current or Ex-Partner:   . Emotionally Abused:   Marland Kitchen Physically Abused:   . Sexually Abused:       PHYSICAL EXAM  Vitals:   10/04/19 1316  BP: 115/78  Pulse: 78  Weight: 118 lb (53.5 kg)  Height: 5' (1.524 m)   Body mass index is 23.05 kg/m.  Generalized: Well developed, in no acute distress  Cardiology: normal rate and rhythm, no murmur noted Respiratory: clear to auscultation bilaterally  Neurological examination  Mentation: Alert oriented to time, place, history taking. Follows all commands speech and language fluent Cranial nerve II-XII: Pupils were equal round reactive to light. Extraocular movements were full, visual field were full on confrontational test. Facial sensation and strength were normal. Uvula tongue midline. Head turning and shoulder shrug  were normal and symmetric. Motor: The motor testing reveals 5 over 5 strength of bilateral upper extremities, 4-/5 left hip, 4+/5 left extension and flexion. 5/5 right lower. Slightly increased tone of left leg.   Sensory: Sensory testing is intact to soft touch on all 4 extremities. No evidence of extinction is noted.  Coordination:  Cerebellar testing reveals good finger-nose-finger and heel-to-shin bilaterally.  Gait and station: Able to stand and transfer to bathroom toilet, does not feel strong enough to walk today (no walker, in wheelchair) Reflexes: Deep tendon reflexes are brisk symmetric and normal bilaterally. No obvious clonus, unable to get patient to fully relax lower extremities for exam.   DIAGNOSTIC DATA (LABS, IMAGING, TESTING) - I reviewed patient  records, labs, notes, testing and imaging myself where available.  No flowsheet data found.   Lab Results  Component Value Date   WBC 3.0 (L) 01/01/2019   HGB 7.7 (L) 01/03/2019   HCT 24.9 (L) 01/03/2019   MCV 85.1 01/01/2019   PLT 169 01/01/2019      Component Value Date/Time   NA 138 01/01/2019 0704   K 3.8 01/01/2019 0704   CL 106 01/01/2019 0704   CO2 22 01/01/2019 0704   GLUCOSE 99 01/01/2019 0704   BUN 21 01/01/2019 0704   CREATININE 0.78 01/01/2019 0704   CALCIUM 8.9 01/01/2019 0704   PROT 6.1 (L) 12/31/2018 0915   ALBUMIN 3.2 (L) 12/31/2018 0915   AST 15 12/31/2018 0915   ALT 9 12/31/2018 0915   ALKPHOS 42 12/31/2018 0915   BILITOT 0.5 12/31/2018 0915   GFRNONAA >60 01/01/2019 0704   GFRAA >60 01/01/2019 0704   Lab Results  Component Value Date   CHOL  06/01/2009    163        ATP III CLASSIFICATION:  <200     mg/dL   Desirable  200-239  mg/dL   Borderline High  >=240    mg/dL   High          HDL 31 (L) 06/01/2009   LDLCALC (H) 06/01/2009    108        Total Cholesterol/HDL:CHD Risk Coronary Heart Disease Risk Table                     Men   Women  1/2 Average Risk   3.4   3.3  Average Risk       5.0   4.4  2 X Average Risk   9.6   7.1  3 X Average Risk  23.4   11.0        Use the calculated Patient Ratio above and the CHD Risk Table to determine the patient's CHD Risk.        ATP III CLASSIFICATION (LDL):  <100     mg/dL   Optimal  100-129  mg/dL   Near or Above                    Optimal  130-159  mg/dL    Borderline  160-189  mg/dL   High  >190     mg/dL   Very High   TRIG 118 06/01/2009   CHOLHDL 5.3 06/01/2009   No results found for: HGBA1C Lab Results  Component Value Date   VITAMINB12 1,457 (H) 08/01/2018        ASSESSMENT AND PLAN 70 y.o. year old female  has a past medical history of Chest pain, Collagen vascular disease ([place]), Complication of anesthesia, Depression, DJD (degenerative joint disease), FH: CVA (cerebrovascular accident), Fibromyalgia, GERD (gastroesophageal reflux disease), Hypothyroidism, Lupus ([place]), Migraines, Palpitations, Status post cholecystectomy, and Tobacco use. here with     ICD-10-CM   1. Neuromyelitis optica (devic) (HCC)  G36.0 MR CERVICAL SPINE W WO CONTRAST    CBC with Differential/Platelets    CMP  2. Muscle spasm  M62.838   3. Left leg weakness  R29.898   4. Dysuria  R30.0 Urinalysis    Culture, Urine    Joaquim Lai notes increased muscle spasms over the past 3-4 weeks. She has tried and failed tizanidine and methocarbamol. She continues Baclofen TID. We will add levetiracetam 500mg  BID. I will update labs today to rule out concerns of infection  due to left flank pain and dysuria. She was hospitalized for similar symptoms in 12/2018. Last MRI in 12/2018 concerning for new cervical lesions. Will order cervical MRI for eval. I have offered steroids but she declines and states she has had worsening symptoms with steroids in the past. We will reevaluate pending labs and MRI. She was encouraged to monitor symptoms closely and call with any concerns of worsening. She will increase water intake. She verbalizes understanding and agreement with this plan.   Orders Placed This Encounter  Procedures  . Culture, Urine  . MR CERVICAL SPINE W WO CONTRAST    Standing Status:   Future    Standing Expiration Date:   10/03/2020    Order Specific Question:   If indicated for the ordered procedure, I authorize the administration of contrast media per Radiology protocol     Answer:   Yes    Order Specific Question:   What is the patient's sedation requirement?    Answer:   No Sedation    Order Specific Question:   Does the patient have a pacemaker or implanted devices?    Answer:   No    Order Specific Question:   Radiology Contrast Protocol - do NOT remove file path    Answer:   [email or web address]    Order Specific Question:   Preferred imaging location?    Answer:   [facility] (table limit - 550lbs)  . CBC with Differential/Platelets  . CMP  . Urinalysis     Meds ordered this encounter  Medications  . levETIRAcetam (KEPPRA) 500 MG tablet    Sig: Take 1 tablet (500 mg total) by mouth 2 (two) times daily.    Dispense:  60 tablet    Refill:  11    Order Specific Question:   Supervising Provider    Answer:   Melvenia [place] [id number]      I spent 45 minutes with the patient. 50% of this time was spent counseling and educating patient on plan of care and medications.     Debbora Presto, FNP-C 10/04/2019, 2:22 PM [facility] [address] [place], [place] [address] [phone]

## 2019-10-05 ENCOUNTER — Telehealth: Payer: Self-pay | Admitting: Family Medicine

## 2019-10-05 NOTE — Telephone Encounter (Signed)
Patient is scheduled at [facility] for Tuesday 10/25/19 arrival time is 12:30 PM. I left a voicemail to inform the patient this. I also left their number of [phone] incase she needed to r/s.  [employer] medic[employer] Josem KaufmannFM:[id number] (exp. 10/05/19 to 04/02/20)

## 2019-10-06 ENCOUNTER — Telehealth: Payer: Self-pay | Admitting: *Deleted

## 2019-10-06 LAB — CBC WITH DIFFERENTIAL/PLATELET
Basophils Absolute: 0 10*3/uL (ref 0.0–0.2)
Basos: 1 %
EOS (ABSOLUTE): 0.1 10*3/uL (ref 0.0–0.4)
Eos: 1 %
Hematocrit: 41.9 % (ref 34.0–46.6)
Hemoglobin: 13.6 g/dL (ref 11.1–15.9)
Immature Grans (Abs): 0 10*3/uL (ref 0.0–0.1)
Immature Granulocytes: 0 %
Lymphocytes Absolute: 1.4 10*3/uL (ref 0.7–3.1)
Lymphs: 18 %
MCH: 26.9 pg (ref 26.6–33.0)
MCHC: 32.5 g/dL (ref 31.5–35.7)
MCV: 83 fL (ref 79–97)
Monocytes Absolute: 0.6 10*3/uL (ref 0.1–0.9)
Monocytes: 7 %
Neutrophils Absolute: 6 10*3/uL (ref 1.4–7.0)
Neutrophils: 73 %
Platelets: 181 10*3/uL (ref 150–450)
RBC: 5.06 x10E6/uL (ref 3.77–5.28)
RDW: 15.8 % — ABNORMAL HIGH (ref 11.7–15.4)
WBC: 8.2 10*3/uL (ref 3.4–10.8)

## 2019-10-06 LAB — COMPREHENSIVE METABOLIC PANEL
ALT: 12 IU/L (ref 0–32)
AST: 18 IU/L (ref 0–40)
Albumin/Globulin Ratio: 1.3 (ref 1.2–2.2)
Albumin: 4.3 g/dL (ref 3.8–4.8)
Alkaline Phosphatase: 77 IU/L (ref 48–121)
BUN/Creatinine Ratio: 14 (ref 12–28)
BUN: 12 mg/dL (ref 8–27)
Bilirubin Total: 0.3 mg/dL (ref 0.0–1.2)
CO2: 23 mmol/L (ref 20–29)
Calcium: 9.8 mg/dL (ref 8.7–10.3)
Chloride: 98 mmol/L (ref 96–106)
Creatinine, Ser: 0.87 mg/dL (ref 0.57–1.00)
GFR calc Af Amer: 79 mL/min/{1.73_m2} (ref >59)
GFR calc non Af Amer: 69 mL/min/{1.73_m2} (ref >59)
Globulin, Total: 3.2 g/dL (ref 1.5–4.5)
Glucose: 94 mg/dL (ref 65–99)
Potassium: 4 mmol/L (ref 3.5–5.2)
Sodium: 138 mmol/L (ref 134–144)
Total Protein: 7.5 g/dL (ref 6.0–8.5)

## 2019-10-06 LAB — URINALYSIS
Bilirubin, UA: NEGATIVE
Glucose, UA: NEGATIVE
Ketones, UA: NEGATIVE
Leukocytes,UA: NEGATIVE
Nitrite, UA: NEGATIVE
Protein,UA: NEGATIVE
RBC, UA: NEGATIVE
Specific Gravity, UA: 1.01 (ref 1.005–1.030)
Urobilinogen, Ur: 0.2 mg/dL (ref 0.2–1.0)
pH, UA: 6 (ref 5.0–7.5)

## 2019-10-06 LAB — URINE CULTURE

## 2019-10-06 MED ORDER — LEVETIRACETAM 500 MG PO TABS: 500.0000 mg | ORAL_TABLET | Freq: Two times a day (BID) | ORAL | 1 refills | Status: DC

## 2019-10-06 NOTE — Telephone Encounter (Signed)
-----   Message from Debbora Presto, NP sent at 10/05/2019  9:32 AM EDT ----- Please let her know that her labs look great so far. I do not see any concerns of a UTI. I am still waiting on urine culture to be certain but no concerns on UA. Was she able to get Keppra? We have placed order for MRI as well sand have asked that this be expedited so she should hear back soon about that. Please have her follow up closely for any worsening or unresolved symptoms.

## 2019-10-06 NOTE — Telephone Encounter (Signed)
I called pt and let her know lab results.   She verbalized understanding.  She did not pick up keppra as she thought it would make her spasms cause seizures.  I told her that keppra was for seizures so hopefully would not cause seizures. I told her to try and see how it affects her and then she can let us know.  I will change to [facility]. DONE.

## 2019-10-07 ENCOUNTER — Encounter (HOSPITAL_COMMUNITY): Admission: RE | Admit: 2019-10-07 | Payer: Medicare HMO | Source: Ambulatory Visit

## 2019-10-12 ENCOUNTER — Encounter (HOSPITAL_COMMUNITY): Payer: Self-pay

## 2019-10-12 ENCOUNTER — Encounter (HOSPITAL_COMMUNITY)
Admission: RE | Admit: 2019-10-12 | Discharge: 2019-10-12 | Disposition: A | Discharge status: Home / Self Care | Payer: Medicare HMO | Source: Ambulatory Visit | Attending: Neurology | Admitting: Neurology

## 2019-10-12 ENCOUNTER — Other Ambulatory Visit: Payer: Self-pay

## 2019-10-12 DIAGNOSIS — G360 Neuromyelitis optica [Devic]: Secondary | ICD-10-CM | POA: Diagnosis present

## 2019-10-12 MED ORDER — NON FORMULARY: 1200.0000 mg | Freq: Once | Status: DC

## 2019-10-12 MED ORDER — SODIUM CHLORIDE 0.9 % IV SOLN
1200.0000 mg | Freq: Once | INTRAVENOUS | Status: AC
  Administered 2019-10-12: 1200 mg via INTRAVENOUS
  Filled 2019-10-12: qty 120

## 2019-10-12 MED ORDER — SODIUM CHLORIDE 0.9 % IV SOLN: Freq: Once | INTRAVENOUS | Status: AC

## 2019-10-24 ENCOUNTER — Telehealth: Payer: Self-pay | Admitting: Family Medicine

## 2019-10-24 NOTE — Telephone Encounter (Signed)
Pt has called to report that she is unable to take the last medication called in for her.  Pt said she flushed it down the toilet.  Please call pt to discuss further.  Pt does not recall the name of the last medication, she just stated she is unable to tolerate it.

## 2019-10-25 ENCOUNTER — Ambulatory Visit (HOSPITAL_COMMUNITY)
Admission: RE | Admit: 2019-10-25 | Discharge: 2019-10-25 | Disposition: A | Discharge status: Home / Self Care | Payer: Medicare HMO | Source: Ambulatory Visit | Attending: Family Medicine | Admitting: Family Medicine

## 2019-10-25 ENCOUNTER — Other Ambulatory Visit: Payer: Self-pay

## 2019-10-25 DIAGNOSIS — M50222 Other cervical disc displacement at C5-C6 level: Secondary | ICD-10-CM | POA: Diagnosis not present

## 2019-10-25 DIAGNOSIS — G360 Neuromyelitis optica [Devic]: Secondary | ICD-10-CM | POA: Diagnosis present

## 2019-10-25 MED ORDER — GADOBUTROL 1 MMOL/ML IV SOLN
5.0000 mL | Freq: Once | INTRAVENOUS | Status: AC | PRN
  Administered 2019-10-25: 5 mL via INTRAVENOUS

## 2019-10-25 NOTE — Telephone Encounter (Signed)
Pt called and LVM. She would like to talk to the nurse about the muscle relaxer Dr. Felecia Shelling has her on and the "nausea medication that Levada Dy" has her on. She wakes up sick on her stomach every morning. Pt stated has been taking Baclofen 10 mg TID, spasms have improved but she needs to know if she needs to do something else. She has an MRI today 12:30 today. She also stated she would try to have her daughter in law set up her vm.   *I am helping check VMs for the clinic.

## 2019-10-25 NOTE — Telephone Encounter (Signed)
Spoke to pt and updated her medlist.  She is taking baclofen 10mg  po tid. She feels like it makes her weak, wants to come off.  Takes ondansetron for nausea.  She states L foot/ hand will draw up at time when walking and other times, and then gets really weak. She also states has nausea daily when she awakes.  Cause?   She had her MRI today.  I relayed will let them know, they may see what her MRI shows and then make recommendation.

## 2019-10-25 NOTE — Telephone Encounter (Signed)
LMVM for pt that returned call re: medication that she could not tolerate.

## 2019-10-26 NOTE — Telephone Encounter (Signed)
Busy

## 2019-10-26 NOTE — Telephone Encounter (Signed)
Please let her know that the MRI has not been read yet. I will make Dr Dater aware as well so he can look at images. She can wean Baclofen if she feels this is making her worse? I am assuming the medication she flushed was the levetiracetam I started? I am not certain why she is feeling so nauseated in the mornings. Does the ondansetron help? I will reach back out to her as soon as we have viewed images/MRI report. TY.

## 2019-10-26 NOTE — Telephone Encounter (Signed)
I took a look at the MRI and compared it to the previous one from 12/31/2018 and the earlier one from 07/31/2018.  It looks good.  There are no new lesions.  A couple of the older lesions look better on the current MRI.  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

## 2019-10-27 NOTE — Telephone Encounter (Signed)
Please let her know that Dr Felecia Shelling has reviewed her images. MRI looks a little better than previous images. That is great news. She can wean baclofen if she feels that it isn't helping. Make sure she has follow up scheduled to see Dr Felecia Shelling in the next 6 months. TY.

## 2019-10-28 ENCOUNTER — Other Ambulatory Visit: Payer: Self-pay

## 2019-10-28 ENCOUNTER — Encounter (HOSPITAL_COMMUNITY)
Admission: RE | Admit: 2019-10-28 | Discharge: 2019-10-28 | Disposition: A | Discharge status: Home / Self Care | Payer: Medicare HMO | Source: Ambulatory Visit | Attending: Neurology | Admitting: Neurology

## 2019-10-28 DIAGNOSIS — G360 Neuromyelitis optica [Devic]: Secondary | ICD-10-CM | POA: Diagnosis not present

## 2019-10-28 MED ORDER — NON FORMULARY: 1200.0000 mg | Freq: Once | Status: DC

## 2019-10-28 MED ORDER — SODIUM CHLORIDE 0.9 % IV SOLN
1200.0000 mg | Freq: Once | INTRAVENOUS | Status: AC
  Administered 2019-10-28: 1200 mg via INTRAVENOUS
  Filled 2019-10-28: qty 120

## 2019-10-28 MED ORDER — SODIUM CHLORIDE 0.9 % IV SOLN: Freq: Once | INTRAVENOUS | Status: AC

## 2019-11-02 DIAGNOSIS — R252 Cramp and spasm: Secondary | ICD-10-CM | POA: Diagnosis not present

## 2019-11-02 DIAGNOSIS — G43709 Chronic migraine without aura, not intractable, without status migrainosus: Secondary | ICD-10-CM | POA: Diagnosis not present

## 2019-11-02 DIAGNOSIS — R251 Tremor, unspecified: Secondary | ICD-10-CM | POA: Diagnosis not present

## 2019-11-02 DIAGNOSIS — Z79891 Long term (current) use of opiate analgesic: Secondary | ICD-10-CM | POA: Diagnosis not present

## 2019-11-02 DIAGNOSIS — M5416 Radiculopathy, lumbar region: Secondary | ICD-10-CM | POA: Diagnosis not present

## 2019-11-02 DIAGNOSIS — G43711 Chronic migraine without aura, intractable, with status migrainosus: Secondary | ICD-10-CM | POA: Diagnosis not present

## 2019-11-02 DIAGNOSIS — M130 Polyarthritis, unspecified: Secondary | ICD-10-CM | POA: Diagnosis not present

## 2019-11-02 DIAGNOSIS — M545 Low back pain: Secondary | ICD-10-CM | POA: Diagnosis not present

## 2019-11-11 ENCOUNTER — Encounter (HOSPITAL_COMMUNITY)
Admission: RE | Admit: 2019-11-11 | Discharge: 2019-11-11 | Disposition: A | Discharge status: Home / Self Care | Payer: Medicare HMO | Source: Ambulatory Visit | Attending: Neurology | Admitting: Neurology

## 2019-11-11 ENCOUNTER — Encounter (HOSPITAL_COMMUNITY): Payer: Self-pay

## 2019-11-11 ENCOUNTER — Other Ambulatory Visit: Payer: Self-pay

## 2019-11-11 DIAGNOSIS — G360 Neuromyelitis optica [Devic]: Secondary | ICD-10-CM | POA: Diagnosis present

## 2019-11-11 MED ORDER — SODIUM CHLORIDE 0.9 % IV SOLN: Freq: Once | INTRAVENOUS | Status: AC

## 2019-11-11 MED ORDER — NON FORMULARY: 1200.0000 mg | Freq: Once | Status: DC

## 2019-11-11 MED ORDER — SODIUM CHLORIDE 0.9 % IV SOLN
1200.0000 mg | INTRAVENOUS | Status: DC
  Administered 2019-11-11: 1200 mg via INTRAVENOUS
  Filled 2019-11-11: qty 120

## 2019-11-21 NOTE — Telephone Encounter (Signed)
I called pt, she stated that she has been off the cymbalta as caused her nausea and constipation. She is also stopped the baclofen she thought due to constipation. She states that is better but she is taking miralax, hydrating well.  She is having spasms in her legs, and wanted to know about taking baclofen,  I relayed that she can go back on this, she may try taking prn. (will take 5 mg po tonight and see how she does. ).

## 2019-11-21 NOTE — Telephone Encounter (Signed)
Pt is asking for a call to discuss going back on baclofen, please call

## 2019-11-25 ENCOUNTER — Encounter (HOSPITAL_COMMUNITY)
Admission: RE | Admit: 2019-11-25 | Discharge: 2019-11-25 | Disposition: A | Discharge status: Home / Self Care | Payer: Medicare HMO | Source: Ambulatory Visit | Attending: Neurology | Admitting: Neurology

## 2019-11-25 ENCOUNTER — Other Ambulatory Visit: Payer: Self-pay

## 2019-11-25 ENCOUNTER — Encounter (HOSPITAL_COMMUNITY): Payer: Self-pay

## 2019-11-25 DIAGNOSIS — G360 Neuromyelitis optica [Devic]: Secondary | ICD-10-CM | POA: Diagnosis not present

## 2019-11-25 MED ORDER — SODIUM CHLORIDE 0.9 % IV SOLN: Freq: Once | INTRAVENOUS | Status: AC

## 2019-11-25 MED ORDER — NON FORMULARY: 1200.0000 mg | Freq: Once | Status: DC

## 2019-11-25 MED ORDER — SODIUM CHLORIDE 0.9 % IV SOLN
1200.0000 mg | Freq: Once | INTRAVENOUS | Status: AC
  Administered 2019-11-25: 1200 mg via INTRAVENOUS
  Filled 2019-11-25: qty 120

## 2019-11-28 DIAGNOSIS — Z6821 Body mass index (BMI) 21.0-21.9, adult: Secondary | ICD-10-CM | POA: Diagnosis not present

## 2019-11-28 DIAGNOSIS — R69 Illness, unspecified: Secondary | ICD-10-CM | POA: Diagnosis not present

## 2019-11-28 DIAGNOSIS — Z299 Encounter for prophylactic measures, unspecified: Secondary | ICD-10-CM | POA: Diagnosis not present

## 2019-11-28 DIAGNOSIS — Z1339 Encounter for screening examination for other mental health and behavioral disorders: Secondary | ICD-10-CM | POA: Diagnosis not present

## 2019-11-28 DIAGNOSIS — M329 Systemic lupus erythematosus, unspecified: Secondary | ICD-10-CM | POA: Diagnosis not present

## 2019-11-28 DIAGNOSIS — Z1331 Encounter for screening for depression: Secondary | ICD-10-CM | POA: Diagnosis not present

## 2019-11-28 DIAGNOSIS — Z7189 Other specified counseling: Secondary | ICD-10-CM | POA: Diagnosis not present

## 2019-11-28 DIAGNOSIS — Z0000 Encounter for general adult medical examination without abnormal findings: Secondary | ICD-10-CM | POA: Diagnosis not present

## 2019-11-28 DIAGNOSIS — R519 Headache, unspecified: Secondary | ICD-10-CM | POA: Diagnosis not present

## 2019-12-09 ENCOUNTER — Encounter (HOSPITAL_COMMUNITY): Payer: Self-pay

## 2019-12-09 ENCOUNTER — Other Ambulatory Visit: Payer: Self-pay

## 2019-12-09 ENCOUNTER — Encounter (HOSPITAL_COMMUNITY)
Admission: RE | Admit: 2019-12-09 | Discharge: 2019-12-09 | Disposition: A | Discharge status: Home / Self Care | Payer: Medicare HMO | Source: Ambulatory Visit | Attending: Neurology | Admitting: Neurology

## 2019-12-09 DIAGNOSIS — G360 Neuromyelitis optica [Devic]: Secondary | ICD-10-CM | POA: Diagnosis not present

## 2019-12-09 MED ORDER — SODIUM CHLORIDE 0.9 % IV SOLN: Freq: Once | INTRAVENOUS | Status: AC

## 2019-12-09 MED ORDER — NON FORMULARY: 1200.0000 mg | Freq: Once | Status: DC

## 2019-12-09 MED ORDER — SODIUM CHLORIDE 0.9 % IV SOLN
1200.0000 mg | Freq: Once | INTRAVENOUS | Status: AC
  Administered 2019-12-09: 1200 mg via INTRAVENOUS
  Filled 2019-12-09: qty 120

## 2019-12-12 IMAGING — MR MRI HEAD WITHOUT AND WITH CONTRAST
14 of 16 series · 40 of 48 positions shown · IV contrast (gadavist)
Comparison: None.

CLINICAL DATA: Myelopathy. Left leg weakness and numbness. Urinary
incontinence.

EXAM:
MRI HEAD WITHOUT AND WITH CONTRAST
TECHNIQUE: Multiplanar, multiecho pulse sequences of the brain and surrounding
structures were obtained without and with intravenous contrast.
CONTRAST:  5 mL Gadavist

[Series 5: DWI · axial · 3.0mm · 0.88mm/px · z∈[-82,+67]mm · 6 of 104 slices shown (1 of 4)]
[im 1/104]
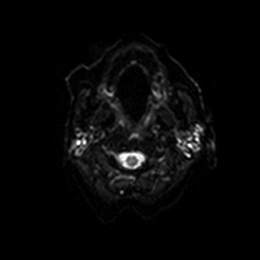
[im 21/104]
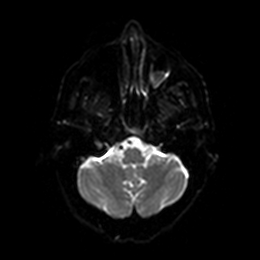
[im 42/104]
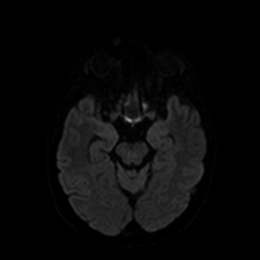
[im 62/104]
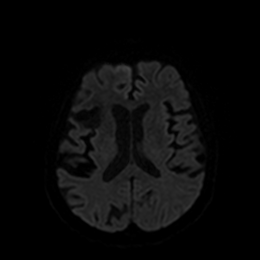
[im 83/104]
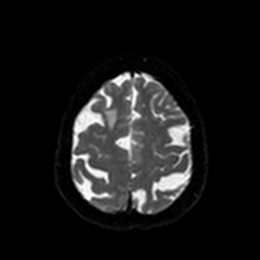
[im 104/104]
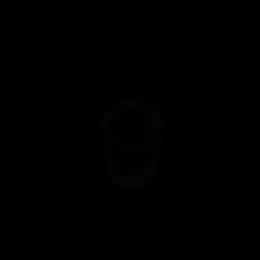

[Series 6: DWI · axial · 3.0mm · 0.88mm/px · z∈[-82,+67]mm · 2 of 52 slices shown (2 of 4)]
[im 1/52]
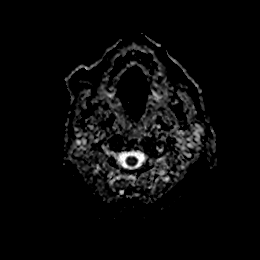
[im 52/52]
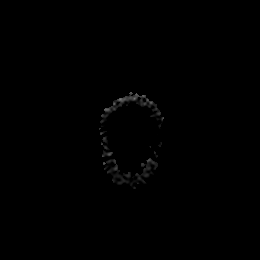

[Series 7: DWI · coronal · 4.0mm · 0.88mm/px · 5 of 72 slices shown (3 of 4)]
[im 1/72]
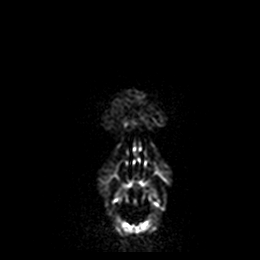
[im 18/72]
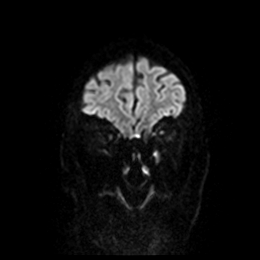
[im 36/72]
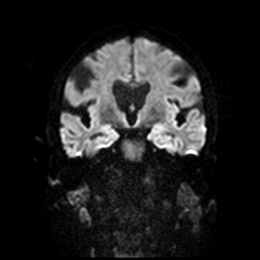
[im 54/72]
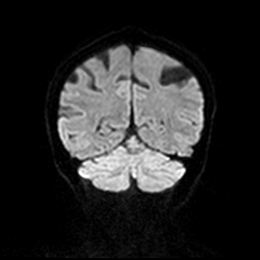
[im 72/72]
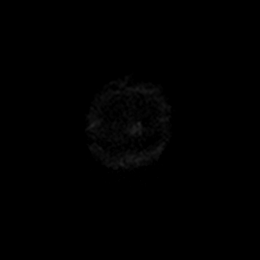

[Series 8: DWI · coronal · 4.0mm · 0.88mm/px · 2 of 36 slices shown (4 of 4)]
[im 1/36]
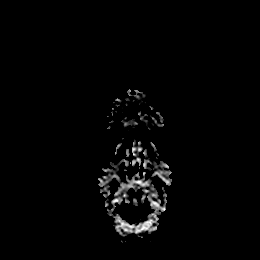
[im 36/36]
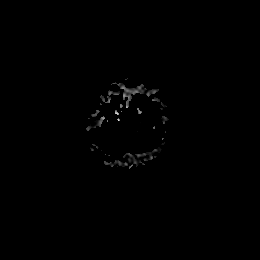

[Series 9: T1 · sagittal · 5.0mm · 0.75mm/px · 1 of 20 slices shown]
[im 1/20]
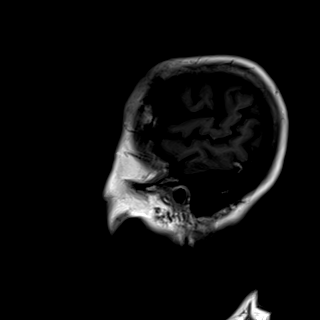

[Series 10: mag_images · axial · 3.0mm · 0.90mm/px · z∈[-86,+86]mm · 4 of 60 slices shown]
[im 1/60]
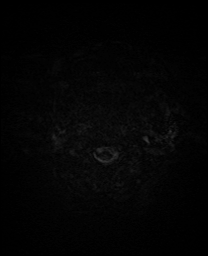
[im 20/60]
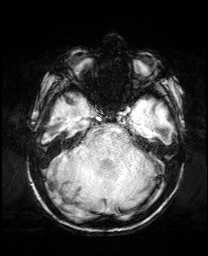
[im 40/60]
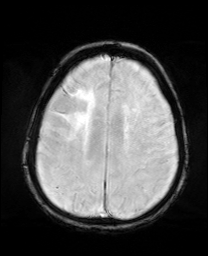
[im 60/60]
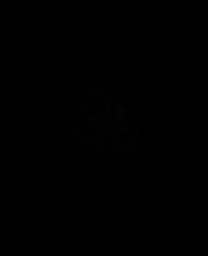

[Series 11: pha_images · axial · 3.0mm · 0.90mm/px · z∈[-86,+83]mm · 4 of 58 slices shown]
[im 1/58]
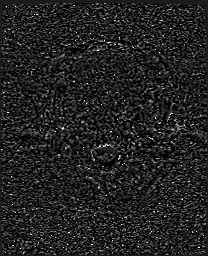
[im 20/58]
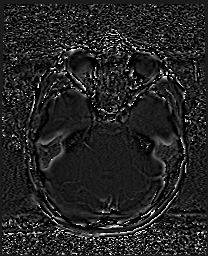
[im 39/58]
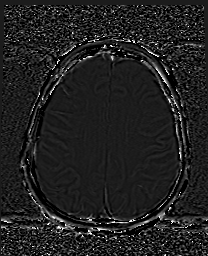
[im 58/58]
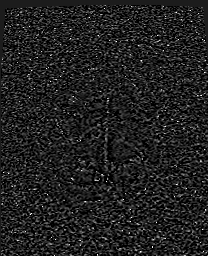

[Series 12: swi_images · axial · 3.0mm · 0.90mm/px · z∈[-86,+86]mm · 4 of 60 slices shown]
[im 1/60]
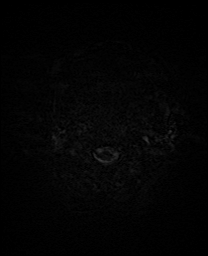
[im 20/60]
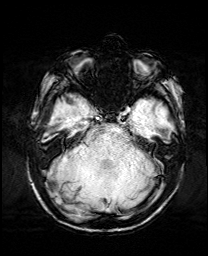
[im 40/60]
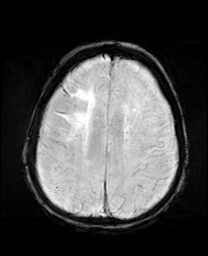
[im 60/60]
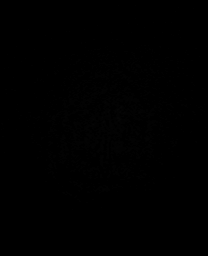

[Series 13: mip_images(sw) · axial · 24.0mm · 0.90mm/px · z∈[-76,+76]mm · 3 of 53 slices shown]
[im 1/53]
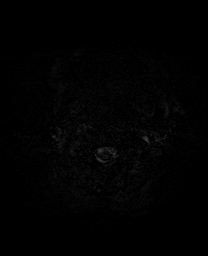
[im 27/53]
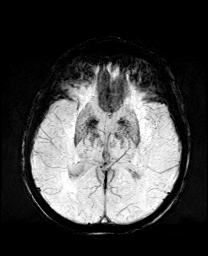
[im 53/53]
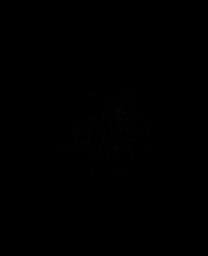

[Series 14: T2 · axial · 5.0mm · 0.72mm/px · z∈[-84,+56]mm · 2 of 25 slices shown]
[im 1/25]
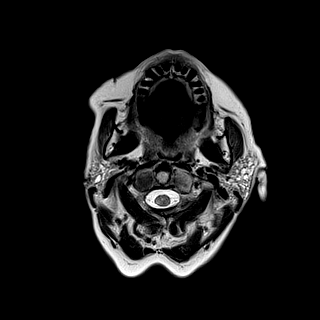
[im 25/25]
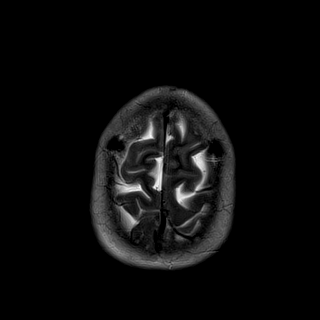

[Series 15: FLAIR · axial · 5.0mm · 0.45mm/px · z∈[-82,+58]mm · 2 of 25 slices shown]
[im 1/25]
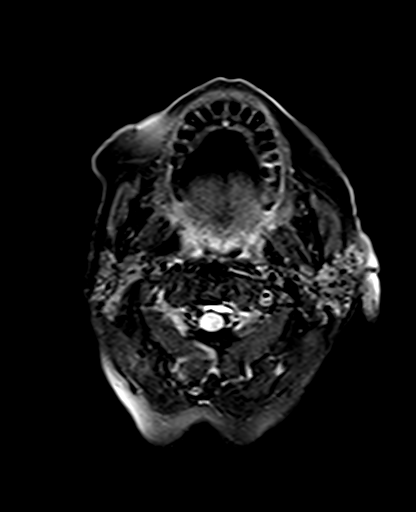
[im 25/25]
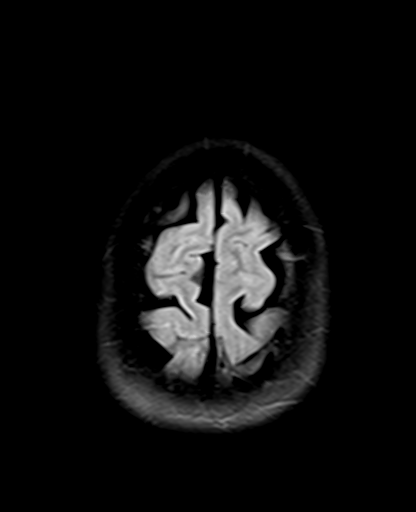

[Series 17: T2 post-contrast · coronal · 5.0mm · 0.72mm/px · 2 of 28 slices shown]
[im 1/28]
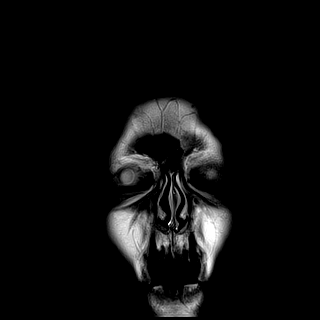
[im 28/28]
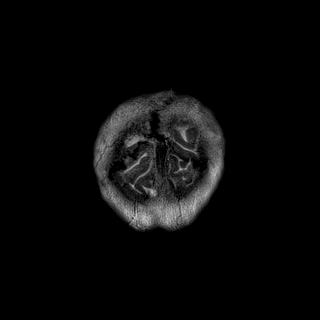

[Series 19: T1 post-contrast · coronal · 5.0mm · 0.34mm/px · 2 of 28 slices shown (1 of 2)]
[im 1/28]
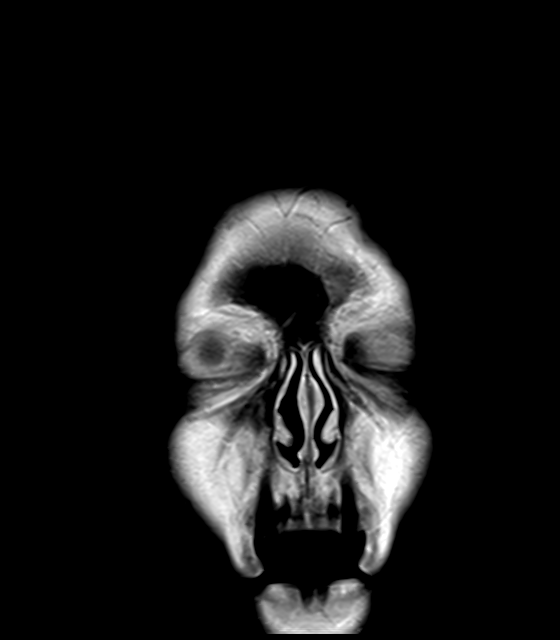
[im 28/28]
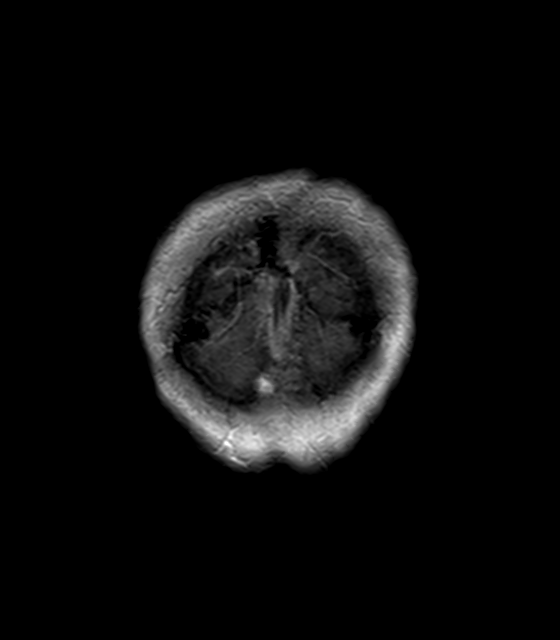

[Series 20: T1 post-contrast · sagittal · 5.0mm · 0.72mm/px · 1 of 20 slices shown (2 of 2)]
[im 1/20]
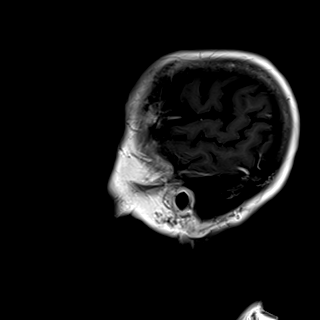

[40 of 48 positions shown; findings below may reference images not displayed]

FINDINGS: Brain: No acute infarct, mass, midline shift, or extra-axial fluid
collection is identified. There is a small to moderate-sized chronic
right MCA infarct involving the frontal operculum and insula with a
small amount of hemosiderin staining. Small foci of T2
hyperintensity scattered elsewhere in the cerebral white matter
bilaterally, most notably in the left corona radiata, are at most
mildly advanced for age. No abnormal brain parenchymal or meningeal
enhancement is identified. There is mild global cerebral atrophy.

Vascular: Major intracranial vascular flow voids are preserved.

Skull and upper cervical spine: Unremarkable bone marrow signal.

Sinuses/Orbits: Unremarkable orbits. Mild circumferential mucosal
thickening and moderate volume fluid in the left maxillary sinus
which appears small. Trace right mastoid effusion.

Other: Diffuse nodularity/microcystic changes throughout both
parotid glands.
IMPRESSION: 1. No acute intracranial abnormality.
2. Chronic right MCA infarct.
3. Cerebral white matter T2 signal changes, nonspecific but
compatible with mild chronic small vessel ischemic disease.
4. Diffuse nodularity/microcystic changes in the parotid gland
suggestive of chronic inflammation or autoimmune disease.

## 2019-12-20 ENCOUNTER — Encounter: Payer: Self-pay | Admitting: Neurology

## 2019-12-20 ENCOUNTER — Other Ambulatory Visit: Payer: Self-pay

## 2019-12-20 ENCOUNTER — Ambulatory Visit: Payer: Medicare HMO | Admitting: Neurology

## 2019-12-20 VITALS — BP 113/64 | HR 67 | Ht 60.0 in | Wt 114.0 lb

## 2019-12-20 DIAGNOSIS — G360 Neuromyelitis optica [Devic]: Secondary | ICD-10-CM

## 2019-12-20 DIAGNOSIS — M62838 Other muscle spasm: Secondary | ICD-10-CM | POA: Insufficient documentation

## 2019-12-20 DIAGNOSIS — Z79899 Other long term (current) drug therapy: Secondary | ICD-10-CM | POA: Insufficient documentation

## 2019-12-20 DIAGNOSIS — R208 Other disturbances of skin sensation: Secondary | ICD-10-CM | POA: Diagnosis not present

## 2019-12-20 DIAGNOSIS — N319 Neuromuscular dysfunction of bladder, unspecified: Secondary | ICD-10-CM

## 2019-12-20 DIAGNOSIS — G8194 Hemiplegia, unspecified affecting left nondominant side: Secondary | ICD-10-CM

## 2019-12-20 MED ORDER — BACLOFEN 10 MG PO TABS: ORAL_TABLET | ORAL | 11 refills | Status: DC

## 2019-12-20 NOTE — Progress Notes (Signed)
[facility]  PATIENT: Susan Hodge DOB: 13-Feb-1951  REFERRING DOCTOR OR PCP:  [name], NP SOURCE: Patient, daughter, notes from hospital and rehab admissions, multiple imaging and lab reports, MRI images of brain, cervical, thoracic and lumbar spine were reviewed  _________________________________   HISTORICAL  CHIEF COMPLAINT:  Chief Complaint  Patient presents with  . Follow-up    RM 12. Last seen 10/04/19 by AL,NP. Son, [name], in room. 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 Receiving Soliris infusions. She is still having some issues with muscle spasms, mostly at night. She is currently taking baclofen 10mg , 0.5 tablet TID.    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  She is receiving the medication closer to her home at [facility] and infusion is under an hour.  She tolerates it well.  She has not had any relapses since starting.  This compares to several relapses in rapid succession earlier in 2020.  She goes about 50 feet and sometimes more with a walker.    She has weakness and spasticity, left > right.     She notes some shaking in the left side of her body at times.She has left leg tingling.    Spasms also involve the trunk.     She takes baclofen with benefit 5 mg tid.    Vision is fine.    She has urinary urgency, helped by Enablex.   She also has back pain leg pain.   An ESI has not helped and actually made her feel worse.   She has had issues tolerating steroids.       She has chronic migraines and takes [employer].  This is helping with less frequency and intensity.   She has had more this month than typical - a couple times a week. She takes Fioricet as needed.    Neuromyelitis optica history: She began to experience clumsiness in mid March.  She had been paced on topiramate for migraines and had recently and had some dizziness and vertigo initially.  She fell and a day later began to have urinary incontinence  and inability to move the left leg.   She went to the ED at [facility] and an MRI of the lumbar spine shoowed degenerative disc changes.    She was prescribed a steroid pack and discharged.   The next day she was worse and her PCP sent her to the [facility] ED.   She had MRI of the brain and cervical and thoracic spine.   The MRI of the brain showed a remote right MCA stroke (she never had symptoms).   MRI of the spine showed multifocal lesions within the spinal cord, centrally but asymmetric.      She received 5 days of IV Solu-Medrol in the hospital.   She initially improved after the steroids and was using a walker well a couple weeks later at discharge from rehab.  Balance was still poor but strength was good.   Of note she had no arm weakness in March or April.   Over the past couple days, she has felt much weaker with more left leg > arm weakness.  .  She also has had right flank numbness and a dysesthetic sensation (pressure like pain) since yesterday.      Bladder impoved with steroids and is continuing to do well (she is on Enablex).    She had the meningitis vaccination (both the [place] and the B  shots) and started [facility] June 2020.    MRIs The MRI of the brain 07/31/2018 shows a remote right MCA infarction with a small amount of associated hemosiderin.  There are also some scattered T2/flair hyperintense foci consistent with mild chronic microvascular ischemic changes.  There were no acute findings.  The MRI of the cervical spine shows T2 hyperintense signal within the spinal cord posteriorly to the right adjacent to C2, posterior to the left adjacent to C3, and a little more to the right but central adjacent to C7.  The foci adjacent to C3 and C7 does have subtle enhancement after gadolinium was infused.  There are some degenerative changes at C4-C5, C5-C6 and C6-C7 but no definite nerve root compression and no spinal stenosis.   MRI of the thoracic spine shows diffuse patchy T2 hyperintensity that  is predominantly central.   Number spine shows the abnormal signal within the spinal cord adjacent to T10 and T11.  There are degenerative changes at L3-L4, L4-L5 and L5-S1.  There is spinal stenosis at L4-L5.  With possibility for left L4 and L5 nerve root  Labs: Neuromyelitis optica IgG antibody was positive at 139 (< 3 normal) at diagnosis.  Lumbar puncture showed a small elevation of white blood cell count, predominantly lymphocytes and macrophages.  B12, copper were normal.  Herpes simplex virus and HIV were negative.  ACE was normal.  REVIEW OF SYSTEMS: Constitutional: No fevers, chills, sweats, or change in appetite Eyes: No visual changes, double vision, eye pain Ear, nose and throat: No hearing loss, ear pain, nasal congestion, sore throat Cardiovascular: No chest pain, palpitations Respiratory: No shortness of breath at rest or with exertion.   No wheezes GastrointestinaI: No nausea, vomiting, diarrhea, abdominal pain, fecal incontinence Genitourinary:As above Musculoskeletal: No neck pain, back pain Integumentary: No rash, pruritus, skin lesions Neurological: as above Psychiatric: No depression at this time.  No anxiety Endocrine: No palpitations, diaphoresis, change in appetite, change in weigh or increased thirst Hematologic/Lymphatic: No anemia, purpura, petechiae. Allergic/Immunologic: No itchy/runny eyes, nasal congestion, recent allergic reactions, rashes  ALLERGIES: Allergies  Allergen Reactions  . Prednisone Other (See Comments)    Not to be given at any point while patient is still taking Soliris infusions.  Severe shaking and dehydration.  . Amitriptyline Other (See Comments)  . Bee Venom Other (See Comments)  . Cymbalta [Duloxetine Hcl] Nausea Only    Constipation   . Fexofenadine Other (See Comments)  . Furosemide Other (See Comments)  . Gabapentin Other (See Comments)  . Hydroxychloroquine Sulfate Other (See Comments)  . Morphine Other (See Comments)  .  Nsaids   . Oxycodone Other (See Comments)    dizzy  . Topiramate Other (See Comments)    HOME MEDICATIONS:  Current Outpatient Medications:  .  acetaminophen (TYLENOL) 325 MG tablet, Take 2 tablets (650 mg total) by mouth every 6 (six) hours as needed for mild pain (or Fever >/= 101)., Disp: , Rfl:  .  ALPRAZolam (XANAX) 0.5 MG tablet, Take 1 tablet (0.5 mg total) by mouth 3 (three) times daily as needed for anxiety or sleep., Disp: 10 tablet, Rfl: 0 .  amoxicillin (AMOXIL) 250 MG capsule, Take 250 mg by mouth 3 (three) times daily as needed (for ear infection/inflammation). , Disp: , Rfl:  .  baclofen (LIORESAL) 10 MG tablet, 1/2 to one pill up to 5 a day, Disp: 150 tablet, Rfl: 11 .  Butalbital-APAP-Caffeine (FIORICET PO), Take by mouth., Disp: , Rfl:  .  Cyanocobalamin (VITAMIN  B-12 PO), Take 2 tablets by mouth daily., Disp: , Rfl:  .  darifenacin (ENABLEX) 15 MG 24 hr tablet, TAKE 1 TABLET BY MOUTH DAILY, Disp: 30 tablet, Rfl: 5 .  DULoxetine (CYMBALTA) 20 MG capsule, duloxetine 20 mg capsule,delayed release  Take 1 capsule every day by oral route., Disp: , Rfl:  .  Eculizumab (SOLIRIS IV), Inject into the vein See admin instructions. 900mg  IV for 4 weeks, 1200mg  at week 5 and then 1200mg  q 2 weeks, Disp: , Rfl:  .  Galcanezumab-gnlm (EMGALITY) 120 MG/ML SOAJ, Inject 120 mg into the skin every 30 (thirty) days., Disp: , Rfl:  .  lidocaine (LIDODERM) 5 %, Place 1 patch onto the skin every 12 (twelve) hours., Disp: , Rfl:  .  loratadine (CLARITIN) 10 MG tablet, Take 10 mg by mouth daily., Disp: , Rfl:  .  ondansetron (ZOFRAN) 4 MG tablet, Take 1 tablet (4 mg total) by mouth every 6 (six) hours as needed for nausea., Disp: 20 tablet, Rfl: 0 .  oxyCODONE-acetaminophen (PERCOCET) 10-325 MG tablet, Take 1 tablet by mouth every 6 (six) hours as needed., Disp: 10 tablet, Rfl: 0 .  pantoprazole (PROTONIX) 40 MG tablet, Take 1 tablet (40 mg total) by mouth 2 (two) times daily before a meal., Disp:  60 tablet, Rfl: 1 .  polyethylene glycol (MIRALAX) 17 g packet, Take 17 g by mouth every morning., Disp: 30 each, Rfl: 3 .  potassium chloride SA (KLOR-CON) 20 MEQ tablet, Take 20 mEq by mouth 2 (two) times daily., Disp: , Rfl:  .  pregabalin (LYRICA) 50 MG capsule, Take 50 mg by mouth 3 (three) times daily., Disp: , Rfl:  .  senna-docusate (SENOKOT-S) 8.6-50 MG tablet, Take 2 tablets by mouth at bedtime., Disp: 60 tablet, Rfl: 3 .  UNABLE TO FIND, Med Name: CBD OIL that she uses topically for pain., Disp: , Rfl:  .  UNABLE TO FIND, daily. Med Name: fiber gummies, Disp: , Rfl:  .  valproic acid (DEPAKENE) 250 MG capsule, Take 1 capsule (250 mg total) by mouth at bedtime., Disp: 30 capsule, Rfl: 1 .  Vitamin D, Ergocalciferol, (DRISDOL) 1.25 MG (50000 UT) CAPS capsule, Take 1 capsule (50,000 Units total) by mouth every Monday., Disp: 4 capsule, Rfl: 0 .  vitamin E 1000 UNIT capsule, Take 1,000 Units by mouth daily., Disp: , Rfl:   PAST MEDICAL HISTORY: Past Medical History:  Diagnosis Date  . Chest pain   . Collagen vascular disease ([place])   . Complication of anesthesia    Blisters to mouth.  . Depression   . DJD (degenerative joint disease)   . FH: CVA (cerebrovascular accident)    father  . Fibromyalgia   . GERD (gastroesophageal reflux disease)   . Hypothyroidism   . Lupus ([place])   . Migraines   . Palpitations   . Status post cholecystectomy    appendectomy and bilaateral oophorectomy  . Tobacco use     PAST SURGICAL HISTORY: Past Surgical History:  Procedure Laterality Date  . BILATERAL OOPHORECTOMY    . CARDIAC CATHETERIZATION    . CHOLECYSTECTOMY    . COLONOSCOPY WITH PROPOFOL N/A 01/03/2019   Procedure: COLONOSCOPY WITH PROPOFOL;  Surgeon: Rogene Houston, MD;  [facility];  Service: Endoscopy;  Laterality: N/A;  . ESOPHAGOGASTRODUODENOSCOPY (EGD) WITH PROPOFOL N/A 01/02/2019   Procedure: ESOPHAGOGASTRODUODENOSCOPY (EGD) WITH PROPOFOL;  Surgeon: Rogene Houston, MD;  Location: [facility];  Service: Endoscopy;  Laterality: N/A;  .  POLYPECTOMY  01/03/2019   Procedure: POLYPECTOMY;  Surgeon: Rogene Houston, MD;  Location: [facility];  Service: Endoscopy;;    FAMILY HISTORY: Family History  Problem Relation Age of Onset  . Stroke Father     SOCIAL HISTORY:  Social History   Socioeconomic History  . Marital status: Single    Spouse name: Not on file  . Number of children: 3  . Years of education: Not on file  . Highest education level: Not on file  Occupational History  . Occupation: disability  Tobacco Use  . Smoking status: Current Every Day Smoker    Packs/day: 0.50    Types: Cigarettes  . Smokeless tobacco: Never Used  . Tobacco comment: quit last week 01/03/19  Vaping Use  . Vaping Use: Never used  Substance and Sexual Activity  . Alcohol use: No  . Drug use: Never  . Sexual activity: Not on file  Other Topics Concern  . Not on file  Social History Narrative   Friend lives with her   Caffeine use: coffee daily   Left handed    Social Determinants of Health   Financial Resource Strain:   . Difficulty of Paying Living Expenses:   Food Insecurity:   . Worried About Charity fundraiser in the Last Year:   . Arboriculturist in the Last Year:   Transportation Needs:   . Film/video editor (Medical):   Marland Kitchen Lack of Transportation (Non-Medical):   Physical Activity:   . Days of Exercise per Week:   . Minutes of Exercise per Session:   Stress:   . Feeling of Stress :   Social Connections:   . Frequency of Communication with Friends and Family:   . Frequency of Social Gatherings with Friends and Family:   . Attends Religious Services:   . Active Member of Clubs or Organizations:   . Attends Archivist Meetings:   Marland Kitchen Marital Status:   Intimate Partner Violence:   . Fear of Current or Ex-Partner:   . Emotionally Abused:   Marland Kitchen Physically Abused:   . Sexually Abused:      PHYSICAL EXAM  Vitals:    12/20/19 1502  BP: 113/64  Pulse: 67  Weight: 114 lb (51.7 kg)  Height: 5' (1.524 m)    Body mass index is 22.26 kg/m.   General: The patient is well-developed and well-nourished and in no acute distress   Skin: Extremities are without rash or edema.   Neurologic Exam  Mental status: The patient is alert and oriented x 3 at the time of the examination. The patient has apparent normal recent and remote memory, with an apparently normal attention span and concentration ability.   Speech is normal.  Cranial nerves: Extraocular movements are full.   Facial symmetry is present. .Facial strength is normal.  Trapezius and sternocleidomastoid strength is normal. No dysarthria is noted.  No obvious hearing deficits are noted.  Motor:  Muscle bulk is normal.   Mildly increased muscle tone in the left leg.  Strength was 4 to 4+/5 in left leg.  Strength is 4+/5 in the right leg.  Strength was 5/5 in the right arm strength is 5/5 in arms   Sensory: She now reports normal sensation to touch and temperature.  However, she has reduced vibration sensation in the left leg.   Coordination: Cerebellar testing reveals good right finger-nose-finger but she is unable to do left finger-nose-finger.  Reduced heel-to-shin, worse on the  left.  Gait and station: Stands up using arms to help.  She can walk with support.  Positive Romberg   Reflexes: Deep tendon reflexes are increased in the left leg relative to the right with spread at knees.  No clonus.    DIAGNOSTIC DATA (LABS, IMAGING, TESTING) - I reviewed patient records, labs, notes, testing and imaging myself where available.  Lab Results  Component Value Date   WBC 8.2 10/04/2019   HGB 13.6 10/04/2019   HCT 41.9 10/04/2019   MCV 83 10/04/2019   PLT 181 10/04/2019      Component Value Date/Time   NA 138 10/04/2019 1436   K 4.0 10/04/2019 1436   CL 98 10/04/2019 1436   CO2 23 10/04/2019 1436   GLUCOSE 94 10/04/2019 1436   GLUCOSE 99  01/01/2019 0704   BUN 12 10/04/2019 1436   CREATININE 0.87 10/04/2019 1436   CALCIUM 9.8 10/04/2019 1436   PROT 7.5 10/04/2019 1436   ALBUMIN 4.3 10/04/2019 1436   AST 18 10/04/2019 1436   ALT 12 10/04/2019 1436   ALKPHOS 77 10/04/2019 1436   BILITOT 0.3 10/04/2019 1436   GFRNONAA 69 10/04/2019 1436   GFRAA 79 10/04/2019 1436    No results found for: HGBA1C Lab Results  Component Value Date   VITAMINB12 1,457 (H) 08/01/2018   _____________________  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   Continue Soliris for neuromyelitis optica.  Check labs today.  She continues to do well. 2.   I advised her to get the vaccination for COVID-19.    She is not interested in doing so.   I would be reluctant to use options such as Rituxan or Uplinza if unvaccinated if Soliris stops helping. 3.  She has moderately severe spinal stenosis at L4-L5, moderate spinal stenosis at L3-L4 mild spinal stenosis at L5-S1. If symptoms worsen again I would recommend that she see neurosurgery. 4.   Continue baclofen 5.    She will return to see me in 6 months or sooner if there are new or worsening neurologic symptoms.     Leonela Kivi A. Felecia Shelling, MD, [facility] 9/40/7680, 8:81 PM Certified in Neurology, Clinical Neurophysiology, Sleep Medicine, Pain Medicine and Neuroimaging  [facility] [address] [place], [place] [address] [phone]

## 2019-12-26 ENCOUNTER — Encounter (HOSPITAL_COMMUNITY)
Admission: RE | Admit: 2019-12-26 | Discharge: 2019-12-26 | Disposition: A | Discharge status: Home / Self Care | Payer: Medicare HMO | Source: Ambulatory Visit | Attending: Neurology | Admitting: Neurology

## 2019-12-26 ENCOUNTER — Other Ambulatory Visit: Payer: Self-pay

## 2019-12-26 ENCOUNTER — Encounter (HOSPITAL_COMMUNITY): Payer: Self-pay

## 2019-12-26 DIAGNOSIS — G360 Neuromyelitis optica [Devic]: Secondary | ICD-10-CM | POA: Diagnosis not present

## 2019-12-26 LAB — CBC WITH DIFFERENTIAL/PLATELET
Basophils Absolute: 0.1 10*3/uL (ref 0.0–0.2)
Basos: 1 %
EOS (ABSOLUTE): 0.1 10*3/uL (ref 0.0–0.4)
Eos: 1 %
Hematocrit: 40.8 % (ref 34.0–46.6)
Hemoglobin: 13.8 g/dL (ref 11.1–15.9)
Immature Grans (Abs): 0 10*3/uL (ref 0.0–0.1)
Immature Granulocytes: 0 %
Lymphocytes Absolute: 1.2 10*3/uL (ref 0.7–3.1)
Lymphs: 22 %
MCH: 28.9 pg (ref 26.6–33.0)
MCHC: 33.8 g/dL (ref 31.5–35.7)
MCV: 86 fL (ref 79–97)
Monocytes Absolute: 0.5 10*3/uL (ref 0.1–0.9)
Monocytes: 9 %
Neutrophils Absolute: 3.8 10*3/uL (ref 1.4–7.0)
Neutrophils: 67 %
Platelets: 171 10*3/uL (ref 150–450)
RBC: 4.77 x10E6/uL (ref 3.77–5.28)
RDW: 14.2 % (ref 11.7–15.4)
WBC: 5.7 10*3/uL (ref 3.4–10.8)

## 2019-12-26 LAB — COMPREHENSIVE METABOLIC PANEL
ALT: 7 IU/L (ref 0–32)
AST: 12 IU/L (ref 0–40)
Albumin/Globulin Ratio: 1.3 (ref 1.2–2.2)
Albumin: 4.1 g/dL (ref 3.8–4.8)
Alkaline Phosphatase: 86 IU/L (ref 48–121)
BUN/Creatinine Ratio: 10 — ABNORMAL LOW (ref 12–28)
BUN: 8 mg/dL (ref 8–27)
Bilirubin Total: 0.3 mg/dL (ref 0.0–1.2)
CO2: 28 mmol/L (ref 20–29)
Calcium: 9.3 mg/dL (ref 8.7–10.3)
Chloride: 100 mmol/L (ref 96–106)
Creatinine, Ser: 0.77 mg/dL (ref 0.57–1.00)
GFR calc Af Amer: 92 mL/min/{1.73_m2} (ref >59)
GFR calc non Af Amer: 80 mL/min/{1.73_m2} (ref >59)
Globulin, Total: 3.1 g/dL (ref 1.5–4.5)
Glucose: 94 mg/dL (ref 65–99)
Potassium: 4.5 mmol/L (ref 3.5–5.2)
Sodium: 139 mmol/L (ref 134–144)
Total Protein: 7.2 g/dL (ref 6.0–8.5)

## 2019-12-26 LAB — NEUROMYELITIS OPTICA AUTOAB, IGG: NMO IgG Autoantibodies: 543.2 U/mL — ABNORMAL HIGH (ref 0.0–3.0)

## 2019-12-26 MED ORDER — SODIUM CHLORIDE 0.9 % IV SOLN: Freq: Once | INTRAVENOUS | Status: AC

## 2019-12-26 MED ORDER — SODIUM CHLORIDE 0.9 % IV SOLN
1200.0000 mg | Freq: Once | INTRAVENOUS | Status: AC
  Administered 2019-12-26: 1200 mg via INTRAVENOUS
  Filled 2019-12-26: qty 120

## 2019-12-26 MED ORDER — NON FORMULARY: 1200.0000 mg | Freq: Once | Status: DC

## 2020-01-02 DIAGNOSIS — M130 Polyarthritis, unspecified: Secondary | ICD-10-CM | POA: Diagnosis not present

## 2020-01-02 DIAGNOSIS — M79605 Pain in left leg: Secondary | ICD-10-CM | POA: Diagnosis not present

## 2020-01-02 DIAGNOSIS — G43709 Chronic migraine without aura, not intractable, without status migrainosus: Secondary | ICD-10-CM | POA: Diagnosis not present

## 2020-01-02 DIAGNOSIS — G43711 Chronic migraine without aura, intractable, with status migrainosus: Secondary | ICD-10-CM | POA: Diagnosis not present

## 2020-01-02 DIAGNOSIS — R252 Cramp and spasm: Secondary | ICD-10-CM | POA: Diagnosis not present

## 2020-01-02 DIAGNOSIS — R251 Tremor, unspecified: Secondary | ICD-10-CM | POA: Diagnosis not present

## 2020-01-02 DIAGNOSIS — M5416 Radiculopathy, lumbar region: Secondary | ICD-10-CM | POA: Diagnosis not present

## 2020-01-02 DIAGNOSIS — M545 Low back pain: Secondary | ICD-10-CM | POA: Diagnosis not present

## 2020-01-02 DIAGNOSIS — Z79891 Long term (current) use of opiate analgesic: Secondary | ICD-10-CM | POA: Diagnosis not present

## 2020-01-10 ENCOUNTER — Encounter (HOSPITAL_COMMUNITY): Payer: Medicare HMO

## 2020-01-13 ENCOUNTER — Encounter (HOSPITAL_COMMUNITY)
Admission: RE | Admit: 2020-01-13 | Discharge: 2020-01-13 | Disposition: A | Discharge status: Home / Self Care | Payer: Medicare HMO | Source: Ambulatory Visit | Attending: Neurology | Admitting: Neurology

## 2020-01-13 ENCOUNTER — Encounter (HOSPITAL_COMMUNITY): Payer: Self-pay

## 2020-01-13 ENCOUNTER — Other Ambulatory Visit: Payer: Self-pay

## 2020-01-13 DIAGNOSIS — G360 Neuromyelitis optica [Devic]: Secondary | ICD-10-CM | POA: Diagnosis not present

## 2020-01-13 MED ORDER — SODIUM CHLORIDE 0.9 % IV SOLN: Freq: Once | INTRAVENOUS | Status: AC

## 2020-01-13 MED ORDER — SODIUM CHLORIDE 0.9 % IV SOLN
1200.0000 mg | Freq: Once | INTRAVENOUS | Status: AC
  Administered 2020-01-13: 1200 mg via INTRAVENOUS
  Filled 2020-01-13: qty 120

## 2020-01-13 MED ORDER — NON FORMULARY: 1200.0000 mg | Freq: Once | Status: DC

## 2020-01-27 ENCOUNTER — Encounter (HOSPITAL_COMMUNITY): Admission: RE | Admit: 2020-01-27 | Payer: Medicare HMO | Source: Ambulatory Visit

## 2020-01-31 ENCOUNTER — Encounter (HOSPITAL_COMMUNITY): Admission: RE | Admit: 2020-01-31 | Payer: Medicare HMO | Source: Ambulatory Visit

## 2020-02-02 DIAGNOSIS — Z299 Encounter for prophylactic measures, unspecified: Secondary | ICD-10-CM | POA: Diagnosis not present

## 2020-02-02 DIAGNOSIS — H9201 Otalgia, right ear: Secondary | ICD-10-CM | POA: Diagnosis not present

## 2020-02-02 DIAGNOSIS — M797 Fibromyalgia: Secondary | ICD-10-CM | POA: Diagnosis not present

## 2020-02-02 DIAGNOSIS — M329 Systemic lupus erythematosus, unspecified: Secondary | ICD-10-CM | POA: Diagnosis not present

## 2020-02-06 ENCOUNTER — Encounter (HOSPITAL_COMMUNITY): Payer: Self-pay

## 2020-02-06 ENCOUNTER — Encounter (HOSPITAL_COMMUNITY)
Admission: RE | Admit: 2020-02-06 | Discharge: 2020-02-06 | Disposition: A | Discharge status: Home / Self Care | Payer: Medicare HMO | Source: Ambulatory Visit | Attending: Neurology | Admitting: Neurology

## 2020-02-06 ENCOUNTER — Other Ambulatory Visit: Payer: Self-pay

## 2020-02-06 DIAGNOSIS — G360 Neuromyelitis optica [Devic]: Secondary | ICD-10-CM | POA: Diagnosis present

## 2020-02-06 MED ORDER — NON FORMULARY: 1200.0000 mg | Freq: Once | Status: DC

## 2020-02-06 MED ORDER — SODIUM CHLORIDE 0.9 % IV SOLN: Freq: Once | INTRAVENOUS | Status: AC

## 2020-02-06 MED ORDER — ECULIZUMAB 300 MG/30ML IV SOLN
1200.0000 mg | Freq: Once | INTRAVENOUS | Status: DC
  Filled 2020-02-06: qty 120

## 2020-02-06 MED ORDER — SODIUM CHLORIDE 0.9 % IV SOLN
1200.0000 mg | Freq: Once | INTRAVENOUS | Status: AC
  Administered 2020-02-06: 1200 mg via INTRAVENOUS
  Filled 2020-02-06: qty 120

## 2020-02-17 ENCOUNTER — Telehealth: Payer: Self-pay | Admitting: Neurology

## 2020-02-17 ENCOUNTER — Other Ambulatory Visit: Payer: Self-pay | Admitting: Neurology

## 2020-02-17 NOTE — Telephone Encounter (Signed)
Pt called wanting to speak to provider about an open wound that she has on her hand. Pt skinned her hand pretty bad on her walker and she states she was informed that if she had an open wound she will not be able to receive her IV. Pt would like to know if she will be alright to get it on Monday. Please advise.

## 2020-02-17 NOTE — Telephone Encounter (Signed)
I spoke to the patient.  Susan Hodge scraped her hand across her walker this morning and has an open wound on the hand.  She wanted to know if it was safe for her to go ahead with the infusion next week.  I advised her to try to dress the wound and use triple antibiotic ointment.  If it does not heal quickly she needs to discuss further with her primary care physician.  It is fine for her to get the infusions as long as she does not have an active infection.

## 2020-02-20 ENCOUNTER — Encounter (HOSPITAL_COMMUNITY): Admission: RE | Admit: 2020-02-20 | Payer: Medicare HMO | Source: Ambulatory Visit

## 2020-02-24 DIAGNOSIS — Z6821 Body mass index (BMI) 21.0-21.9, adult: Secondary | ICD-10-CM | POA: Diagnosis not present

## 2020-02-24 DIAGNOSIS — R69 Illness, unspecified: Secondary | ICD-10-CM | POA: Diagnosis not present

## 2020-02-24 DIAGNOSIS — U071 COVID-19: Secondary | ICD-10-CM | POA: Diagnosis not present

## 2020-02-24 DIAGNOSIS — M329 Systemic lupus erythematosus, unspecified: Secondary | ICD-10-CM | POA: Diagnosis not present

## 2020-02-24 DIAGNOSIS — I82402 Acute embolism and thrombosis of unspecified deep veins of left lower extremity: Secondary | ICD-10-CM | POA: Diagnosis not present

## 2020-02-27 ENCOUNTER — Encounter (HOSPITAL_COMMUNITY): Admission: RE | Admit: 2020-02-27 | Payer: Medicare HMO | Source: Ambulatory Visit

## 2020-03-07 DIAGNOSIS — H9209 Otalgia, unspecified ear: Secondary | ICD-10-CM | POA: Diagnosis not present

## 2020-03-07 DIAGNOSIS — G43909 Migraine, unspecified, not intractable, without status migrainosus: Secondary | ICD-10-CM | POA: Diagnosis not present

## 2020-03-07 DIAGNOSIS — Z299 Encounter for prophylactic measures, unspecified: Secondary | ICD-10-CM | POA: Diagnosis not present

## 2020-03-07 DIAGNOSIS — U071 COVID-19: Secondary | ICD-10-CM | POA: Diagnosis not present

## 2020-03-07 DIAGNOSIS — R69 Illness, unspecified: Secondary | ICD-10-CM | POA: Diagnosis not present

## 2020-03-08 ENCOUNTER — Other Ambulatory Visit: Payer: Self-pay

## 2020-03-08 ENCOUNTER — Encounter (HOSPITAL_COMMUNITY)
Admission: RE | Admit: 2020-03-08 | Discharge: 2020-03-08 | Disposition: A | Discharge status: Home / Self Care | Payer: Medicare HMO | Source: Ambulatory Visit | Attending: Neurology | Admitting: Neurology

## 2020-03-08 DIAGNOSIS — G360 Neuromyelitis optica [Devic]: Secondary | ICD-10-CM | POA: Insufficient documentation

## 2020-03-08 MED ORDER — NON FORMULARY: 1200.0000 mg | Freq: Once | Status: DC

## 2020-03-08 MED ORDER — SODIUM CHLORIDE 0.9 % IV SOLN
1200.0000 mg | Freq: Once | INTRAVENOUS | Status: AC
  Administered 2020-03-08: 1200 mg via INTRAVENOUS
  Filled 2020-03-08: qty 120

## 2020-03-08 MED ORDER — SODIUM CHLORIDE 0.9 % IV SOLN: Freq: Once | INTRAVENOUS | Status: AC

## 2020-03-12 ENCOUNTER — Telehealth: Payer: Self-pay | Admitting: *Deleted

## 2020-03-12 NOTE — Telephone Encounter (Signed)
PA approved via [facility] 03/12/20-09/09/20. Auth# [id number]. Faxed noticed of approval to [facility] with updated orders at [phone]. Received fax confirmation.

## 2020-03-12 NOTE — Telephone Encounter (Signed)
Submitted PA Soliris on CMM. Key: [id number] - PA Case ID: [id number]. Waiting on determination from [facility].   New orders pending MD signature and then will fax to [facility].

## 2020-03-22 ENCOUNTER — Encounter (HOSPITAL_COMMUNITY): Payer: Self-pay

## 2020-03-22 ENCOUNTER — Encounter (HOSPITAL_COMMUNITY)
Admission: RE | Admit: 2020-03-22 | Discharge: 2020-03-22 | Disposition: A | Discharge status: Home / Self Care | Payer: Medicare HMO | Source: Ambulatory Visit | Attending: Neurology | Admitting: Neurology

## 2020-03-22 ENCOUNTER — Other Ambulatory Visit: Payer: Self-pay

## 2020-03-22 DIAGNOSIS — G360 Neuromyelitis optica [Devic]: Secondary | ICD-10-CM | POA: Diagnosis not present

## 2020-03-22 MED ORDER — SODIUM CHLORIDE 0.9 % IV SOLN
1200.0000 mg | Freq: Once | INTRAVENOUS | Status: AC
  Administered 2020-03-22: 1200 mg via INTRAVENOUS
  Filled 2020-03-22: qty 120

## 2020-03-22 MED ORDER — SODIUM CHLORIDE 0.9 % IV SOLN: Freq: Once | INTRAVENOUS | Status: AC

## 2020-03-22 MED ORDER — NON FORMULARY: 1200.0000 mg | Freq: Once | Status: DC

## 2020-03-26 DIAGNOSIS — Z79891 Long term (current) use of opiate analgesic: Secondary | ICD-10-CM | POA: Diagnosis not present

## 2020-03-26 DIAGNOSIS — R252 Cramp and spasm: Secondary | ICD-10-CM | POA: Diagnosis not present

## 2020-03-26 DIAGNOSIS — M545 Low back pain, unspecified: Secondary | ICD-10-CM | POA: Diagnosis not present

## 2020-03-26 DIAGNOSIS — G43711 Chronic migraine without aura, intractable, with status migrainosus: Secondary | ICD-10-CM | POA: Diagnosis not present

## 2020-03-26 DIAGNOSIS — R251 Tremor, unspecified: Secondary | ICD-10-CM | POA: Diagnosis not present

## 2020-03-26 DIAGNOSIS — G43709 Chronic migraine without aura, not intractable, without status migrainosus: Secondary | ICD-10-CM | POA: Diagnosis not present

## 2020-03-26 DIAGNOSIS — M130 Polyarthritis, unspecified: Secondary | ICD-10-CM | POA: Diagnosis not present

## 2020-03-26 DIAGNOSIS — M79605 Pain in left leg: Secondary | ICD-10-CM | POA: Diagnosis not present

## 2020-03-26 DIAGNOSIS — M5416 Radiculopathy, lumbar region: Secondary | ICD-10-CM | POA: Diagnosis not present

## 2020-04-05 ENCOUNTER — Encounter (HOSPITAL_COMMUNITY): Admission: RE | Admit: 2020-04-05 | Payer: Medicare HMO | Source: Ambulatory Visit

## 2020-04-11 ENCOUNTER — Other Ambulatory Visit: Payer: Self-pay

## 2020-04-11 ENCOUNTER — Encounter (HOSPITAL_COMMUNITY): Payer: Self-pay

## 2020-04-11 ENCOUNTER — Encounter (HOSPITAL_COMMUNITY)
Admission: RE | Admit: 2020-04-11 | Discharge: 2020-04-11 | Disposition: A | Discharge status: Home / Self Care | Payer: Medicare HMO | Source: Ambulatory Visit | Attending: Neurology | Admitting: Neurology

## 2020-04-11 DIAGNOSIS — G360 Neuromyelitis optica [Devic]: Secondary | ICD-10-CM | POA: Diagnosis not present

## 2020-04-11 MED ORDER — NON FORMULARY: 1200.0000 mg | Freq: Once | Status: DC

## 2020-04-11 MED ORDER — SODIUM CHLORIDE 0.9 % IV SOLN: Freq: Once | INTRAVENOUS | Status: AC

## 2020-04-11 MED ORDER — SODIUM CHLORIDE 0.9 % IV SOLN
1200.0000 mg | Freq: Once | INTRAVENOUS | Status: AC
  Administered 2020-04-11: 1200 mg via INTRAVENOUS
  Filled 2020-04-11: qty 120

## 2020-04-24 ENCOUNTER — Telehealth: Payer: Medicare HMO | Admitting: Emergency Medicine

## 2020-04-24 DIAGNOSIS — U071 COVID-19: Secondary | ICD-10-CM | POA: Diagnosis not present

## 2020-04-24 DIAGNOSIS — M797 Fibromyalgia: Secondary | ICD-10-CM | POA: Diagnosis not present

## 2020-04-24 DIAGNOSIS — R059 Cough, unspecified: Secondary | ICD-10-CM | POA: Diagnosis not present

## 2020-04-24 DIAGNOSIS — E559 Vitamin D deficiency, unspecified: Secondary | ICD-10-CM | POA: Diagnosis not present

## 2020-04-24 DIAGNOSIS — Z299 Encounter for prophylactic measures, unspecified: Secondary | ICD-10-CM | POA: Diagnosis not present

## 2020-04-24 DIAGNOSIS — R69 Illness, unspecified: Secondary | ICD-10-CM | POA: Diagnosis not present

## 2020-04-24 DIAGNOSIS — M546 Pain in thoracic spine: Secondary | ICD-10-CM

## 2020-04-24 NOTE — Progress Notes (Signed)
Based on what you shared with me, your extensive medical history and your current medication list, I feel this platform is not appropriate for your evaluation and treatment. I recommend that you be seen for a face to face visit.  Please contact your primary care physician practice to be seen. Many offices offer virtual options to be seen via video if you are not comfortable going in person to a medical facility at this time.  If you do not have a PCP, [facility] offers a free physician referral service available at [phone]. Our trained staff has the experience, knowledge and resources to put you in touch with a physician who is right for you.   You also have the option of a video visit through [email or web address]  If you are having a true medical emergency please call 911.  NOTE: If you entered your credit card information for this eVisit, you will not be charged. You may see a "hold" on your card for the $35 but that hold will drop off and you will not have a charge processed.  Your e-visit answers were reviewed by a board certified advanced clinical practitioner to complete your personal care plan.  Thank you for using e-Visits.  Greater than 5 but less than 10 minutes spent researching, coordinating, and implementing care for this patient today

## 2020-04-25 ENCOUNTER — Encounter (HOSPITAL_COMMUNITY): Payer: Self-pay

## 2020-04-25 ENCOUNTER — Encounter (HOSPITAL_COMMUNITY)
Admission: RE | Admit: 2020-04-25 | Discharge: 2020-04-25 | Disposition: A | Discharge status: Home / Self Care | Payer: Medicare HMO | Source: Ambulatory Visit | Attending: Neurology | Admitting: Neurology

## 2020-04-25 ENCOUNTER — Other Ambulatory Visit: Payer: Self-pay

## 2020-04-25 DIAGNOSIS — G360 Neuromyelitis optica [Devic]: Secondary | ICD-10-CM | POA: Diagnosis not present

## 2020-04-25 MED ORDER — SODIUM CHLORIDE 0.9 % IV SOLN
1200.0000 mg | Freq: Once | INTRAVENOUS | Status: AC
  Administered 2020-04-25: 1200 mg via INTRAVENOUS
  Filled 2020-04-25: qty 120

## 2020-04-25 MED ORDER — SODIUM CHLORIDE 0.9 % IV SOLN: Freq: Once | INTRAVENOUS | Status: AC

## 2020-04-25 MED ORDER — NON FORMULARY: 1200.0000 mg | Freq: Once | Status: DC

## 2020-05-09 ENCOUNTER — Encounter (HOSPITAL_COMMUNITY): Payer: Medicare HMO

## 2020-05-10 ENCOUNTER — Encounter (HOSPITAL_COMMUNITY)
Admission: RE | Admit: 2020-05-10 | Discharge: 2020-05-10 | Disposition: A | Discharge status: Home / Self Care | Payer: Medicare HMO | Source: Ambulatory Visit | Attending: Neurology | Admitting: Neurology

## 2020-05-10 ENCOUNTER — Other Ambulatory Visit: Payer: Self-pay

## 2020-05-10 ENCOUNTER — Encounter (HOSPITAL_COMMUNITY): Payer: Self-pay

## 2020-05-10 DIAGNOSIS — G360 Neuromyelitis optica [Devic]: Secondary | ICD-10-CM | POA: Insufficient documentation

## 2020-05-10 MED ORDER — SODIUM CHLORIDE 0.9 % IV SOLN
1200.0000 mg | Freq: Once | INTRAVENOUS | Status: AC
  Administered 2020-05-10: 1200 mg via INTRAVENOUS
  Filled 2020-05-10: qty 120

## 2020-05-10 MED ORDER — SODIUM CHLORIDE 0.9 % IV SOLN: Freq: Once | INTRAVENOUS | Status: AC

## 2020-05-10 MED ORDER — NON FORMULARY: 1200.0000 mg | Freq: Once | Status: DC

## 2020-05-24 ENCOUNTER — Encounter (HOSPITAL_COMMUNITY)
Admission: RE | Admit: 2020-05-24 | Discharge: 2020-05-24 | Disposition: A | Discharge status: Home / Self Care | Payer: Medicare HMO | Source: Ambulatory Visit | Attending: Neurology | Admitting: Neurology

## 2020-05-24 ENCOUNTER — Encounter (HOSPITAL_COMMUNITY): Payer: Self-pay

## 2020-05-24 ENCOUNTER — Other Ambulatory Visit: Payer: Self-pay

## 2020-05-24 DIAGNOSIS — G360 Neuromyelitis optica [Devic]: Secondary | ICD-10-CM | POA: Diagnosis not present

## 2020-05-24 MED ORDER — NON FORMULARY: 1200.0000 mg | Freq: Once | Status: DC

## 2020-05-24 MED ORDER — SODIUM CHLORIDE 0.9 % IV SOLN
1200.0000 mg | Freq: Once | INTRAVENOUS | Status: AC
  Administered 2020-05-24: 1200 mg via INTRAVENOUS
  Filled 2020-05-24: qty 120

## 2020-05-24 MED ORDER — SODIUM CHLORIDE 0.9 % IV SOLN: Freq: Once | INTRAVENOUS | Status: AC

## 2020-06-01 DIAGNOSIS — R69 Illness, unspecified: Secondary | ICD-10-CM | POA: Diagnosis not present

## 2020-06-01 DIAGNOSIS — F17210 Nicotine dependence, cigarettes, uncomplicated: Secondary | ICD-10-CM | POA: Diagnosis not present

## 2020-06-01 DIAGNOSIS — Z6821 Body mass index (BMI) 21.0-21.9, adult: Secondary | ICD-10-CM | POA: Diagnosis not present

## 2020-06-01 DIAGNOSIS — M797 Fibromyalgia: Secondary | ICD-10-CM | POA: Diagnosis not present

## 2020-06-01 DIAGNOSIS — H6990 Unspecified Eustachian tube disorder, unspecified ear: Secondary | ICD-10-CM | POA: Diagnosis not present

## 2020-06-01 DIAGNOSIS — G43909 Migraine, unspecified, not intractable, without status migrainosus: Secondary | ICD-10-CM | POA: Diagnosis not present

## 2020-06-01 DIAGNOSIS — Z299 Encounter for prophylactic measures, unspecified: Secondary | ICD-10-CM | POA: Diagnosis not present

## 2020-06-07 ENCOUNTER — Encounter (HOSPITAL_COMMUNITY): Admission: RE | Admit: 2020-06-07 | Payer: Medicare HMO | Source: Ambulatory Visit

## 2020-06-13 ENCOUNTER — Encounter (HOSPITAL_COMMUNITY): Admission: RE | Admit: 2020-06-13 | Payer: Medicare HMO | Source: Ambulatory Visit

## 2020-06-14 DIAGNOSIS — G360 Neuromyelitis optica [Devic]: Secondary | ICD-10-CM | POA: Diagnosis not present

## 2020-06-14 DIAGNOSIS — Z299 Encounter for prophylactic measures, unspecified: Secondary | ICD-10-CM | POA: Diagnosis not present

## 2020-06-14 DIAGNOSIS — F1320 Sedative, hypnotic or anxiolytic dependence, uncomplicated: Secondary | ICD-10-CM | POA: Diagnosis not present

## 2020-06-14 DIAGNOSIS — F17210 Nicotine dependence, cigarettes, uncomplicated: Secondary | ICD-10-CM | POA: Diagnosis not present

## 2020-06-14 DIAGNOSIS — M329 Systemic lupus erythematosus, unspecified: Secondary | ICD-10-CM | POA: Diagnosis not present

## 2020-06-14 DIAGNOSIS — R69 Illness, unspecified: Secondary | ICD-10-CM | POA: Diagnosis not present

## 2020-06-14 DIAGNOSIS — Z6821 Body mass index (BMI) 21.0-21.9, adult: Secondary | ICD-10-CM | POA: Diagnosis not present

## 2020-06-18 DIAGNOSIS — G43711 Chronic migraine without aura, intractable, with status migrainosus: Secondary | ICD-10-CM | POA: Diagnosis not present

## 2020-06-18 DIAGNOSIS — M5416 Radiculopathy, lumbar region: Secondary | ICD-10-CM | POA: Diagnosis not present

## 2020-06-18 DIAGNOSIS — G43709 Chronic migraine without aura, not intractable, without status migrainosus: Secondary | ICD-10-CM | POA: Diagnosis not present

## 2020-06-18 DIAGNOSIS — M130 Polyarthritis, unspecified: Secondary | ICD-10-CM | POA: Diagnosis not present

## 2020-06-18 DIAGNOSIS — M79605 Pain in left leg: Secondary | ICD-10-CM | POA: Diagnosis not present

## 2020-06-18 DIAGNOSIS — R252 Cramp and spasm: Secondary | ICD-10-CM | POA: Diagnosis not present

## 2020-06-18 DIAGNOSIS — H9209 Otalgia, unspecified ear: Secondary | ICD-10-CM | POA: Diagnosis not present

## 2020-06-18 DIAGNOSIS — M545 Low back pain, unspecified: Secondary | ICD-10-CM | POA: Diagnosis not present

## 2020-06-18 DIAGNOSIS — R251 Tremor, unspecified: Secondary | ICD-10-CM | POA: Diagnosis not present

## 2020-06-18 DIAGNOSIS — Z79891 Long term (current) use of opiate analgesic: Secondary | ICD-10-CM | POA: Diagnosis not present

## 2020-06-20 ENCOUNTER — Encounter (HOSPITAL_COMMUNITY)
Admission: RE | Admit: 2020-06-20 | Discharge: 2020-06-20 | Disposition: A | Discharge status: Home / Self Care | Payer: Medicare HMO | Source: Ambulatory Visit | Attending: Neurology | Admitting: Neurology

## 2020-06-20 ENCOUNTER — Other Ambulatory Visit: Payer: Self-pay

## 2020-06-20 DIAGNOSIS — G360 Neuromyelitis optica [Devic]: Secondary | ICD-10-CM | POA: Diagnosis not present

## 2020-06-20 MED ORDER — NON FORMULARY: 1200.0000 mg | Freq: Once | Status: DC

## 2020-06-20 MED ORDER — SODIUM CHLORIDE 0.9 % IV SOLN
1200.0000 mg | Freq: Once | INTRAVENOUS | Status: AC
  Administered 2020-06-20: 1200 mg via INTRAVENOUS
  Filled 2020-06-20: qty 120

## 2020-06-20 MED ORDER — SODIUM CHLORIDE 0.9 % IV SOLN: Freq: Once | INTRAVENOUS | Status: AC

## 2020-06-21 ENCOUNTER — Ambulatory Visit: Payer: Medicare HMO | Admitting: Family Medicine

## 2020-06-21 NOTE — Progress Notes (Deleted)
PATIENT: Susan Hodge DOB: [birth year]  REASON FOR VISIT: follow up HISTORY FROM: patient  No chief complaint on file.    HISTORY OF PRESENT ILLNESS: 06/21/20 ALL:  She returns for follow up for NMO. She continues Solaris q2w.   Baclofen up to 10mg  five times daily.  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  She is receiving the medication closer to her home at [facility] and infusion is under an hour.  She tolerates it well.  She has not had any relapses since starting.  This compares to several relapses in rapid succession earlier in 2020.  She goes about 50 feet and sometimes more with a walker.    She has weakness and spasticity, left > right.     She notes some shaking in the left side of her body at times.She has left leg tingling.    Spasms also involve the trunk.     She takes baclofen with benefit 5 mg tid.    Vision is fine.    She has urinary urgency, helped by Enablex.   She also has back pain leg pain.   An ESI has not helped and actually made her feel worse.   She has had issues tolerating steroids.       She has chronic migraines and takes [employer].  This is helping with less frequency and intensity.   She has had more this month than typical - a couple times a week. She takes Fioricet as needed.  Neuromyelitis optica history: She began to experience clumsiness in mid March.  She had been paced on topiramate for migraines and had recently and had some dizziness and vertigo initially.  She fell and a day later began to have urinary incontinence and inability to move the left leg.   She went to the ED at [facility] and an MRI of the lumbar spine shoowed degenerative disc changes.    She was prescribed a steroid pack and discharged.   The next day she was worse and her PCP sent her to the [facility] ED.   She had MRI of the brain and cervical and thoracic spine.   The MRI of the brain showed a remote right MCA stroke (she  never had symptoms).   MRI of the spine showed multifocal lesions within the spinal cord, centrally but asymmetric.      She received 5 days of IV Solu-Medrol in the hospital.   She initially improved after the steroids and was using a walker well a couple weeks later at discharge from rehab.  Balance was still poor but strength was good.   Of note she had no arm weakness in March or April.   Over the past couple days, she has felt much weaker with more left leg > arm weakness.  .  She also has had right flank numbness and a dysesthetic sensation (pressure like pain) since yesterday.      Bladder impoved with steroids and is continuing to do well (she is on Enablex).    She had the meningitis vaccination (both the [place] and the B shots) and started [facility] June 2020.    MRIs The MRI of the brain 07/31/2018 shows a remote right MCA infarction with a small amount of associated hemosiderin.  There are also some scattered T2/flair hyperintense foci consistent with mild chronic microvascular ischemic changes.  There were no acute findings.  The MRI of  the cervical spine shows T2 hyperintense signal within the spinal cord posteriorly to the right adjacent to C2, posterior to the left adjacent to C3, and a little more to the right but central adjacent to C7.  The foci adjacent to C3 and C7 does have subtle enhancement after gadolinium was infused.  There are some degenerative changes at C4-C5, C5-C6 and C6-C7 but no definite nerve root compression and no spinal stenosis.   MRI of the thoracic spine shows diffuse patchy T2 hyperintensity that is predominantly central.   Number spine shows the abnormal signal within the spinal cord adjacent to T10 and T11.  There are degenerative changes at L3-L4, L4-L5 and L5-S1.  There is spinal stenosis at L4-L5.  With possibility for left L4 and L5 nerve root  Labs: Neuromyelitis optica IgG antibody was positive at 139 (< 3 normal) at diagnosis.  Lumbar puncture showed a small  elevation of white blood cell count, predominantly lymphocytes and macrophages.  B12, copper were normal.  Herpes simplex virus and HIV were negative.  ACE was normal.   10/04/2019 ALL:  Susan Hodge is a 70 y.o. female here today with concerns of weakness and fatigue. She continues IV Solaris for NMO following a history of multiple relapses in rapid succession early 2020. Last infusion was two weeks ago.   She started having more spasms and left sided weakness about 3-4 weeks ago. Baclofen was increased to 4 times daily and tizanidine added. She felt that this made spasms worse.  Tizanidine was stopped and methocarbamol was added on 5/24. Again, she felt that spasms were worse. She continued baclofen only. She reports that over the weekend she started having worsening spasms. She went to spend the night with her son. She noticed that she was having 8-10 "spasms" of left left arm and left leg. Her fingers would "draw in". She feels that her left leg is weaker than normal. She has residual numbness from left foot to just below knee. This is unchanged. She has noticed that it is harder to walk. She is using her wheelchair but feels it is harder to move her left leg. She feels really drained following spasms. She is conscious through spasm. No LOC or confusion. Having left sided flank pain. She has also had nausea for the past few weeks. She has taken ondansetron 4mg  regularly which helps. She noticed that she was burning with urination about 3 days ago but she reports that this can come and go. She denies frequency. No hematuria. She has had regular bowel movements but feels that she is not "going enough." Stool is soft, no blood. NO vision changes. No falls. NO fever, chills or viral symptoms.    MRI Brain 12/2018: IMPRESSION: 1.  No acute intracranial abnormality. 2. Stable MRI appearance of the brain since March with chronic anterior division right MCA infarct and subtle Wallerian degeneration. 3.  Total Spine MRI today reported separately. 4. Chronic nodular appearance of the parotid gland suggesting chronic/recurrent salivary gland inflammation, such as Sjogren Syndrome.  MRI Cervical/Thoracic Spine 12/2018: IMPRESSION: 1. Mixed appearance of the cervical and thoracic spinal cord lesions since March: - new/progressed cord lesion at the C2-C3 level. - regressed lower cervical cord signal abnormality. - progressed in near complete thoracic cord STIR signal abnormality now, although the cord appears less expanded and perhaps with a degree of myelomalacia in the mid thoracic cord. 2. The conus medullaris seems to remain spared. 3. Today there is no abnormal cervical or thoracic  cord enhancement. 4. Given the loss of both enhancement and cord expansion metastatic or neoplastic etiologies seem excluded. This seems to be an inflammatory process with a lack of involvement in the brain. Top differential considerations include: Lupus, Sjogren syndrome (note parotid abnormality on brain MRIs), Behcet disease, Neurosarcoidosis. Less likely considerations include multiple sclerosis, other autoimmune etiologies, and spinal cord ischemia. 5. Superimposed cervical spine degeneration with multilevel mild spinal stenosis which appears stable since March. 6. Thoracic disc and endplate degeneration without spinal stenosis.   HISTORY: (copied from Dr Garth Bigness note on 06/22/2019)  She is on Soliris for Neuromyelitis optica .  She is receiving the medication closer to her home.  She tolerates it well.  She has not had any relapses since starting.  This compares to several relapses in rapid succession earlier in 2020.  She was experiencing pain in her Lower back that radiated some down the left leg.   She felt much worse after she took Prednisone with lots of spasticity, left > right.   She had an ESI in the past which was difficult with a lot of pain.    I showed her and her son the MRI images  explaining what spinal stenosis was.  I had a long conversation with them that if pain worsens she may need to be seen by neurosurgery as she did not get a benefit from epidural steroid injections previously.  She has chronic migraines and takes [employer].  This is helping with less frequency and intensity.    She takes Fioricet as needed.  Update 09/23/2018 (virtual) At the last visit, given the combination of her presentation, spinal cord MRI and anti-NMO antibody, she was diagnosed with neuromyelitis optica.  She appeared to have had a brand-new exacerbation that started a day or 2 before the visit and I gave her 1 day of high-dose IV Solu-Medrol followed by a very high-dose prednisone oral taper.  She feels she improved a lot after the steroids.  Specifically, she is able to walk with her walker again (she had been doing that when discharged from the hospital but lost that ability a day or 2 before the visit).  Additionally, the left hand weakness and clumsiness which began shortly before the visit has improved though her left arm is still worse than the right arm.  Of note, she is left-handed.  She is able to write again though her handwriting is messy.  She had not been able to write a couple weeks ago.    At that time, we discussed treatment options.  Soliris is FDA approved for neuromyelitis optica and Rituxan is used quite a bit off label.  Because of the COVID-19 pandemic, Soliris would be safer.  She did get the initial meningitis vaccination (both the [place] and the B shots) and she will need boosters next month for the 1 and in 2 months for the other.  Hopefully she will quickly get authorized so we can get her Soliris infusions started.  I discussed with her that since she appears to respond well to the steroids that I will place her on a 20 mg oral dose in the interim though the IV will be much stronger.  Vasculitis lab work performed at the last visit was negative.  Currently, gait is still  poor though she is able to use the walker.   She has clumsiness in the left hand though it is much better than 2 weeks ago.  Strength is better in the left hand  and arm.  Vision is fine.  She has urinary urgency and rare urge incontinence (just once since last visit and couldn't get to bathroom in time.  She is on Enablex 7.5 mg and we discussed a higher dose.  She has pain in her feet and her back.   She notes her legs will spasm when she stands up.   She also has a dysesthetic sensation in both feet.  Lyrica has helped that some.   Observations/Objective: She is a well-developed well-nourished woman in no acute distress.  The head is normocephalic and atraumatic.  Sclera are anicteric.  Visible skin appears normal.  The neck has a good range of motion.  Pharynx and tongue have normal appearance.  She is alert and fully oriented with fluent speech and good attention, knowledge and memory.  Extraocular muscles are intact.  Facial strength is normal.  Palatal elevation and tongue protrusion are midline.  She appears to have normal strength in the right arm and slightly reduced strength in the left arm with reduced left Rapid alternating movements and reduced left finger-nose-finger.  Assessment and Plan: Neuromyelitis optica (devic) (HCC)  Left hemiplegia ([place])  Dysesthesia  Spinal stenosis of lumbar region, unspecified whether neurogenic claudication present  1.   She has neuromyelitis optica and we are trying to get her started on Soliris therapy.  I am concerned that she had her second relapse a few weeks after she completed steroids for her first relapse.  Therefore, I am going to have her start prednisone 20 mg daily to help hold her until we can get authorization for her infusions. 2.   Increase Enablex to 15 mg.  Begin baclofen 10 mg and titrate up to 10 mg 3 times daily for spasticity of central origin. 3.   Stay active and exercise as tolerated.  She will continue to see rehab. 4.    I will see her back for regular visit in 2 months and she will be beginning infusions soon she should call if she has new or worsening neurologic symptoms.  Follow Up Instructions: I discussed the assessment and treatment plan with the patient. The patient was provided an opportunity to ask questions and all were answered. The patient agreed with the plan and demonstrated an understanding of the instructions.    The patient was advised to call back or seek an in-person evaluation if the symptoms worsen or if the condition fails to improve as anticipated.  I provided 25 minutes of non-face-to-face time during this encounter.  __________________________________________________ FROM INITIAL VISIT 09/09/2018: HISTORY OF PRESENT ILLNESS:  I had the pleasure of seeing your patient, Setareh Rom, at [facility] for neurologic consultation regarding her transverse myelitis.  She began to experience clumsiness in mid March.  She had been paced on topiramate for migraines and had recently and had some dizziness and vertigo initially.  She fell and a day later began to have urinary incontinence and inability to move the left leg.   She went to the ED at [facility] and an MRI of the lumbar spine shoowed degenerative disc changes.    She was prescribed a steroid pack and discharged.   The next day she was worse and her PCP sent her to the [facility] ED.   She had MRI of the brain and cervical and thoracic spine.   The MRI of hte brain showed a remote right MCA stroke (she never had symptoms).   MRI of the spine showed multifocal lesions  within the spinal cord, centrally but asymmetric.      She received 5 days of IV Solu-Medrol in the hospital.   She initially improved after the steroids and was using a walker well a couple weeks later at discharge from rehab.  Balance was still poor but strength was good.   Of note she had no arm weakness in March or April.   Over the past couple days, she  has felt much weaker with more left leg > arm weakness.  .  She also has had right flank numbness and a dysesthetic sensation (pressure like pain) since yesterday.      Bladder impoved with steroids and is continuing to do well (she is on Enablex).    She notes no change in vision.  She notes some fatigue especially with endurance.  She denies any depression or anxiety.  She had shingles Sep 24, 2017 in the right V1 distribution with ocular and forehead rash.   She has had headaches since that time.   Even before shingles, she was on Lyrica for leg pain.   Even before shingles, she had migraines and was on Depakene.  She is currently on Percocet 7.5 mg 3-4 a day.    Her only loss of vision occurred with the shingles last year and resolved as the rash resolved.   She was diagnosed with SLE in the 1980's but was never on an immunomodulatory agent.   She had a butterfly rash and abnormal tests.   We don't have any more details.  She also has a long history of migraine headaches that predated her more recent issues.  She is on Eliquis due to a DVT noted during her march admission.     I personally reviewed the MRIs of the brain and spine from March 2020.  The MRI of the brain 07/31/2018 shows a remote right MCA infarction with a small amount of associated hemosiderin.  There are also some scattered T2/flair hyperintense foci consistent with mild chronic microvascular ischemic changes.  There were no acute findings.  The MRI of the cervical spine shows T2 hyperintense signal within the spinal cord posteriorly to the right adjacent to C2, posterior to the left adjacent to C3, and a little more to the right but central adjacent to C7.  The foci adjacent to C3 and C7 does have subtle enhancement after gadolinium was infused.  There are some degenerative changes at C4-C5, C5-C6 and C6-C7 but no definite nerve root compression and no spinal stenosis.   MRI of the thoracic spine shows diffuse patchy T2  hyperintensity that is predominantly central.   Number spine shows the abnormal signal within the spinal cord adjacent to T10 and T11.  There are degenerative changes at L3-L4, L4-L5 and L5-S1.  There is spinal stenosis at L4-L5.  With possibility for left L4 and L5 nerve root  She had lab work during her hospitalization.  The neuromyelitis optica IgG antibody was very positive at 139 (< 3 normal).  Lumbar puncture showed a small elevation of white blood cell count, predominantly lymphocytes and macrophages.  B12, copper were normal.  Herpes simplex virus and HIV were negative.  ACE was normal.   REVIEW OF SYSTEMS: Out of a complete 14 system review of symptoms, the patient complains only of the following symptoms, muscle spasms, weakness, dysuria, left flank pain and all other reviewed systems are negative.  ALLERGIES: Allergies  Allergen Reactions  . Prednisone Other (See Comments)    Not to  be given at any point while patient is still taking Soliris infusions.  Severe shaking and dehydration.  . Amitriptyline Other (See Comments)  . Bee Venom Other (See Comments)  . Cymbalta [Duloxetine Hcl] Nausea Only    Constipation   . Fexofenadine Other (See Comments)  . Furosemide Other (See Comments)  . Gabapentin Other (See Comments)  . Hydroxychloroquine Sulfate Other (See Comments)  . Morphine Other (See Comments)  . Nsaids   . Oxycodone Other (See Comments)    dizzy  . Topiramate Other (See Comments)    HOME MEDICATIONS: Outpatient Medications Prior to Visit  Medication Sig Dispense Refill  . acetaminophen (TYLENOL) 325 MG tablet Take 2 tablets (650 mg total) by mouth every 6 (six) hours as needed for mild pain (or Fever >/= 101).    Marland Kitchen ALPRAZolam (XANAX) 0.5 MG tablet Take 1 tablet (0.5 mg total) by mouth 3 (three) times daily as needed for anxiety or sleep. 10 tablet 0  . amoxicillin (AMOXIL) 250 MG capsule Take 250 mg by mouth 3 (three) times daily as needed (for ear  infection/inflammation).     . baclofen (LIORESAL) 10 MG tablet 1/2 to one pill up to 5 a day 150 tablet 11  . Butalbital-APAP-Caffeine (FIORICET PO) Take by mouth.    . Cyanocobalamin (VITAMIN B-12 PO) Take 2 tablets by mouth daily.    Marland Kitchen darifenacin (ENABLEX) 15 MG 24 hr tablet TAKE 1 TABLET BY MOUTH DAILY 30 tablet 5  . DULoxetine (CYMBALTA) 20 MG capsule duloxetine 20 mg capsule,delayed release  Take 1 capsule every day by oral route.    . Eculizumab (SOLIRIS IV) Inject 1,200 mg into the vein every 14 (fourteen) days.     . Galcanezumab-gnlm (EMGALITY) 120 MG/ML SOAJ Inject 120 mg into the skin every 30 (thirty) days.    Marland Kitchen lidocaine (LIDODERM) 5 % Place 1 patch onto the skin every 12 (twelve) hours.    Marland Kitchen loratadine (CLARITIN) 10 MG tablet Take 10 mg by mouth daily.    . ondansetron (ZOFRAN) 4 MG tablet Take 1 tablet (4 mg total) by mouth every 6 (six) hours as needed for nausea. 20 tablet 0  . oxyCODONE-acetaminophen (PERCOCET) 10-325 MG tablet Take 1 tablet by mouth every 6 (six) hours as needed. 10 tablet 0  . pantoprazole (PROTONIX) 40 MG tablet Take 1 tablet (40 mg total) by mouth 2 (two) times daily before a meal. 60 tablet 1  . polyethylene glycol (MIRALAX) 17 g packet Take 17 g by mouth every morning. 30 each 3  . potassium chloride SA (KLOR-CON) 20 MEQ tablet Take 20 mEq by mouth 2 (two) times daily.    . pregabalin (LYRICA) 50 MG capsule Take 50 mg by mouth 3 (three) times daily.    Marland Kitchen UNABLE TO FIND Med Name: CBD OIL that she uses topically for pain.    Marland Kitchen UNABLE TO FIND daily. Med Name: fiber gummies    . valproic acid (DEPAKENE) 250 MG capsule Take 1 capsule (250 mg total) by mouth at bedtime. 30 capsule 1  . Vitamin D, Ergocalciferol, (DRISDOL) 1.25 MG (50000 UT) CAPS capsule Take 1 capsule (50,000 Units total) by mouth every Monday. 4 capsule 0  . vitamin E 1000 UNIT capsule Take 1,000 Units by mouth daily.     No facility-administered medications prior to visit.    PAST  MEDICAL HISTORY: Past Medical History:  Diagnosis Date  . Chest pain   . Collagen vascular disease ([place])   . Complication of  anesthesia    Blisters to mouth.  . Depression   . DJD (degenerative joint disease)   . FH: CVA (cerebrovascular accident)    father  . Fibromyalgia   . GERD (gastroesophageal reflux disease)   . Hypothyroidism   . Lupus ([place])   . Migraines   . Palpitations   . Status post cholecystectomy    appendectomy and bilaateral oophorectomy  . Tobacco use     PAST SURGICAL HISTORY: Past Surgical History:  Procedure Laterality Date  . BILATERAL OOPHORECTOMY    . CARDIAC CATHETERIZATION    . CHOLECYSTECTOMY    . COLONOSCOPY WITH PROPOFOL N/A 01/03/2019   Procedure: COLONOSCOPY WITH PROPOFOL;  Surgeon: Rogene Houston, MD;  Location: [facility];  Service: Endoscopy;  Laterality: N/A;  . ESOPHAGOGASTRODUODENOSCOPY (EGD) WITH PROPOFOL N/A 01/02/2019   Procedure: ESOPHAGOGASTRODUODENOSCOPY (EGD) WITH PROPOFOL;  Surgeon: Rogene Houston, MD;  Location: [facility];  Service: Endoscopy;  Laterality: N/A;  . POLYPECTOMY  01/03/2019   Procedure: POLYPECTOMY;  Surgeon: Rogene Houston, MD;  Location: [facility];  Service: Endoscopy;;    FAMILY HISTORY: Family History  Problem Relation Age of Onset  . Stroke Father     SOCIAL HISTORY: Social History   Socioeconomic History  . Marital status: Single    Spouse name: Not on file  . Number of children: 3  . Years of education: Not on file  . Highest education level: Not on file  Occupational History  . Occupation: disability  Tobacco Use  . Smoking status: Current Every Day Smoker    Packs/day: 0.50    Types: Cigarettes  . Smokeless tobacco: Never Used  . Tobacco comment: quit last week 01/03/19  Vaping Use  . Vaping Use: Never used  Substance and Sexual Activity  . Alcohol use: No  . Drug use: Never  . Sexual activity: Not on file  Other Topics Concern  . Not on file  Social History Narrative    Friend lives with her   Caffeine use: coffee daily   Left handed    Social Determinants of Health   Financial Resource Strain: Not on file  Food Insecurity: Not on file  Transportation Needs: Not on file  Physical Activity: Not on file  Stress: Not on file  Social Connections: Not on file  Intimate Partner Violence: Not on file      PHYSICAL EXAM  There were no vitals filed for this visit. There is no height or weight on file to calculate BMI.  Generalized: Well developed, in no acute distress  Cardiology: normal rate and rhythm, no murmur noted Respiratory: clear to auscultation bilaterally  Neurological examination  Mentation: Alert oriented to time, place, history taking. Follows all commands speech and language fluent Cranial nerve II-XII: Pupils were equal round reactive to light. Extraocular movements were full, visual field were full on confrontational test. Facial sensation and strength were normal. Uvula tongue midline. Head turning and shoulder shrug  were normal and symmetric. Motor: The motor testing reveals 5 over 5 strength of bilateral upper extremities, 4-/5 left hip, 4+/5 left extension and flexion. 5/5 right lower. Slightly increased tone of left leg.   Sensory: Sensory testing is intact to soft touch on all 4 extremities. No evidence of extinction is noted.  Coordination: Cerebellar testing reveals good finger-nose-finger and heel-to-shin bilaterally.  Gait and station: Able to stand and transfer to bathroom toilet, does not feel strong enough to walk today (no walker, in wheelchair) Reflexes: Deep  tendon reflexes are brisk symmetric and normal bilaterally. No obvious clonus, unable to get patient to fully relax lower extremities for exam.   DIAGNOSTIC DATA (LABS, IMAGING, TESTING) - I reviewed patient records, labs, notes, testing and imaging myself where available.  No flowsheet data found.   Lab Results  Component Value Date   WBC 5.7 12/20/2019    HGB 13.8 12/20/2019   HCT 40.8 12/20/2019   MCV 86 12/20/2019   PLT 171 12/20/2019      Component Value Date/Time   NA 139 12/20/2019 1558   K 4.5 12/20/2019 1558   CL 100 12/20/2019 1558   CO2 28 12/20/2019 1558   GLUCOSE 94 12/20/2019 1558   GLUCOSE 99 01/01/2019 0704   BUN 8 12/20/2019 1558   CREATININE 0.77 12/20/2019 1558   CALCIUM 9.3 12/20/2019 1558   PROT 7.2 12/20/2019 1558   ALBUMIN 4.1 12/20/2019 1558   AST 12 12/20/2019 1558   ALT 7 12/20/2019 1558   ALKPHOS 86 12/20/2019 1558   BILITOT 0.3 12/20/2019 1558   GFRNONAA 80 12/20/2019 1558   GFRAA 92 12/20/2019 1558   Lab Results  Component Value Date   CHOL  06/01/2009    163        ATP III CLASSIFICATION:  <200     mg/dL   Desirable  200-239  mg/dL   Borderline High  >=240    mg/dL   High          HDL 31 (L) 06/01/2009   LDLCALC (H) 06/01/2009    108        Total Cholesterol/HDL:CHD Risk Coronary Heart Disease Risk Table                     Men   Women  1/2 Average Risk   3.4   3.3  Average Risk       5.0   4.4  2 X Average Risk   9.6   7.1  3 X Average Risk  23.4   11.0        Use the calculated Patient Ratio above and the CHD Risk Table to determine the patient's CHD Risk.        ATP III CLASSIFICATION (LDL):  <100     mg/dL   Optimal  100-129  mg/dL   Near or Above                    Optimal  130-159  mg/dL   Borderline  160-189  mg/dL   High  >190     mg/dL   Very High   TRIG 118 06/01/2009   CHOLHDL 5.3 06/01/2009   No results found for: HGBA1C Lab Results  Component Value Date   VITAMINB12 1,457 (H) 08/01/2018        ASSESSMENT AND PLAN 70 y.o. year old female  has a past medical history of Chest pain, Collagen vascular disease ([place]), Complication of anesthesia, Depression, DJD (degenerative joint disease), FH: CVA (cerebrovascular accident), Fibromyalgia, GERD (gastroesophageal reflux disease), Hypothyroidism, Lupus ([place]), Migraines, Palpitations, Status post cholecystectomy, and  Tobacco use. here with   No diagnosis found.   Shatia notes increased muscle spasms over the past 3-4 weeks. She has tried and failed tizanidine and methocarbamol. She continues Baclofen TID. We will add levetiracetam 500mg  BID. I will update labs today to rule out concerns of infection due to left flank pain and dysuria. She was hospitalized for similar symptoms in 12/2018. Last MRI  in 12/2018 concerning for new cervical lesions. Will order cervical MRI for eval. I have offered steroids but she declines and states she has had worsening symptoms with steroids in the past. We will reevaluate pending labs and MRI. She was encouraged to monitor symptoms closely and call with any concerns of worsening. She will increase water intake. She verbalizes understanding and agreement with this plan.   No orders of the defined types were placed in this encounter.    No orders of the defined types were placed in this encounter.     I spent 45 minutes with the patient. 50% of this time was spent counseling and educating patient on plan of care and medications.     Debbora Presto, FNP-C 06/21/2020, 9:48 AM [facility] [address] [place], [place] [address] [phone]

## 2020-06-21 NOTE — Patient Instructions (Incomplete)

## 2020-07-04 ENCOUNTER — Encounter (HOSPITAL_COMMUNITY): Payer: Self-pay

## 2020-07-04 ENCOUNTER — Encounter (HOSPITAL_COMMUNITY)
Admission: RE | Admit: 2020-07-04 | Discharge: 2020-07-04 | Disposition: A | Discharge status: Home / Self Care | Payer: Medicare HMO | Source: Ambulatory Visit | Attending: Neurology | Admitting: Neurology

## 2020-07-04 ENCOUNTER — Other Ambulatory Visit: Payer: Self-pay

## 2020-07-04 DIAGNOSIS — G360 Neuromyelitis optica [Devic]: Secondary | ICD-10-CM | POA: Diagnosis not present

## 2020-07-04 MED ORDER — NON FORMULARY: 1200.0000 mg | Freq: Once | Status: DC

## 2020-07-04 MED ORDER — SODIUM CHLORIDE 0.9 % IV SOLN: Freq: Once | INTRAVENOUS | Status: AC

## 2020-07-04 MED ORDER — SODIUM CHLORIDE 0.9 % IV SOLN
1200.0000 mg | Freq: Once | INTRAVENOUS | Status: AC
  Administered 2020-07-04: 1200 mg via INTRAVENOUS
  Filled 2020-07-04: qty 120

## 2020-07-11 DIAGNOSIS — Z299 Encounter for prophylactic measures, unspecified: Secondary | ICD-10-CM | POA: Diagnosis not present

## 2020-07-11 DIAGNOSIS — M797 Fibromyalgia: Secondary | ICD-10-CM | POA: Diagnosis not present

## 2020-07-11 DIAGNOSIS — F17210 Nicotine dependence, cigarettes, uncomplicated: Secondary | ICD-10-CM | POA: Diagnosis not present

## 2020-07-11 DIAGNOSIS — R69 Illness, unspecified: Secondary | ICD-10-CM | POA: Diagnosis not present

## 2020-07-11 DIAGNOSIS — Z6821 Body mass index (BMI) 21.0-21.9, adult: Secondary | ICD-10-CM | POA: Diagnosis not present

## 2020-07-11 DIAGNOSIS — R339 Retention of urine, unspecified: Secondary | ICD-10-CM | POA: Diagnosis not present

## 2020-07-11 DIAGNOSIS — Z86718 Personal history of other venous thrombosis and embolism: Secondary | ICD-10-CM | POA: Diagnosis not present

## 2020-07-18 ENCOUNTER — Other Ambulatory Visit: Payer: Self-pay

## 2020-07-18 ENCOUNTER — Encounter (HOSPITAL_COMMUNITY)
Admission: RE | Admit: 2020-07-18 | Discharge: 2020-07-18 | Disposition: A | Discharge status: Home / Self Care | Payer: Medicare HMO | Source: Ambulatory Visit | Attending: Neurology | Admitting: Neurology

## 2020-07-18 ENCOUNTER — Encounter (HOSPITAL_COMMUNITY): Payer: Self-pay

## 2020-07-18 DIAGNOSIS — G360 Neuromyelitis optica [Devic]: Secondary | ICD-10-CM | POA: Diagnosis not present

## 2020-07-18 MED ORDER — SODIUM CHLORIDE 0.9 % IV SOLN
1200.0000 mg | Freq: Once | INTRAVENOUS | Status: AC
  Administered 2020-07-18: 1200 mg via INTRAVENOUS
  Filled 2020-07-18: qty 120

## 2020-07-18 MED ORDER — NON FORMULARY: 1200.0000 mg | Freq: Once | Status: DC

## 2020-07-18 MED ORDER — SODIUM CHLORIDE 0.9 % IV SOLN: Freq: Once | INTRAVENOUS | Status: AC

## 2020-07-24 DIAGNOSIS — Z6821 Body mass index (BMI) 21.0-21.9, adult: Secondary | ICD-10-CM | POA: Diagnosis not present

## 2020-07-24 DIAGNOSIS — F1320 Sedative, hypnotic or anxiolytic dependence, uncomplicated: Secondary | ICD-10-CM | POA: Diagnosis not present

## 2020-07-24 DIAGNOSIS — F17210 Nicotine dependence, cigarettes, uncomplicated: Secondary | ICD-10-CM | POA: Diagnosis not present

## 2020-07-24 DIAGNOSIS — R69 Illness, unspecified: Secondary | ICD-10-CM | POA: Diagnosis not present

## 2020-07-24 DIAGNOSIS — M329 Systemic lupus erythematosus, unspecified: Secondary | ICD-10-CM | POA: Diagnosis not present

## 2020-07-24 DIAGNOSIS — G360 Neuromyelitis optica [Devic]: Secondary | ICD-10-CM | POA: Diagnosis not present

## 2020-07-24 DIAGNOSIS — R350 Frequency of micturition: Secondary | ICD-10-CM | POA: Diagnosis not present

## 2020-07-24 DIAGNOSIS — Z299 Encounter for prophylactic measures, unspecified: Secondary | ICD-10-CM | POA: Diagnosis not present

## 2020-07-25 DIAGNOSIS — Z79899 Other long term (current) drug therapy: Secondary | ICD-10-CM | POA: Diagnosis not present

## 2020-08-01 ENCOUNTER — Encounter (HOSPITAL_COMMUNITY): Payer: Self-pay

## 2020-08-01 ENCOUNTER — Other Ambulatory Visit: Payer: Self-pay

## 2020-08-01 ENCOUNTER — Encounter (HOSPITAL_COMMUNITY)
Admission: RE | Admit: 2020-08-01 | Discharge: 2020-08-01 | Disposition: A | Discharge status: Home / Self Care | Payer: Medicare HMO | Source: Ambulatory Visit | Attending: Neurology | Admitting: Neurology

## 2020-08-01 DIAGNOSIS — G360 Neuromyelitis optica [Devic]: Secondary | ICD-10-CM | POA: Diagnosis not present

## 2020-08-01 MED ORDER — SODIUM CHLORIDE 0.9 % IV SOLN
1200.0000 mg | Freq: Once | INTRAVENOUS | Status: AC
  Administered 2020-08-01: 1200 mg via INTRAVENOUS
  Filled 2020-08-01: qty 120

## 2020-08-01 MED ORDER — SODIUM CHLORIDE 0.9 % IV SOLN: Freq: Once | INTRAVENOUS | Status: AC

## 2020-08-01 MED ORDER — NON FORMULARY: 1200.0000 mg | Freq: Once | Status: DC

## 2020-08-15 ENCOUNTER — Encounter (HOSPITAL_COMMUNITY): Payer: Self-pay

## 2020-08-15 ENCOUNTER — Encounter (HOSPITAL_COMMUNITY)
Admission: RE | Admit: 2020-08-15 | Discharge: 2020-08-15 | Disposition: A | Discharge status: Home / Self Care | Payer: Medicare HMO | Source: Ambulatory Visit | Attending: Neurology | Admitting: Neurology

## 2020-08-15 ENCOUNTER — Other Ambulatory Visit: Payer: Self-pay

## 2020-08-15 DIAGNOSIS — G360 Neuromyelitis optica [Devic]: Secondary | ICD-10-CM | POA: Diagnosis not present

## 2020-08-15 MED ORDER — NON FORMULARY: 1200.0000 mg | Freq: Once | Status: DC

## 2020-08-15 MED ORDER — ECULIZUMAB 300 MG/30ML IV SOLN
1200.0000 mg | Freq: Once | INTRAVENOUS | Status: DC
  Filled 2020-08-15: qty 120

## 2020-08-15 MED ORDER — SODIUM CHLORIDE 0.9 % IV SOLN: Freq: Once | INTRAVENOUS | Status: AC

## 2020-08-15 MED ORDER — SODIUM CHLORIDE 0.9 % IV SOLN
1200.0000 mg | Freq: Once | INTRAVENOUS | Status: AC
  Administered 2020-08-15: 1200 mg via INTRAVENOUS
  Filled 2020-08-15: qty 120

## 2020-08-29 ENCOUNTER — Encounter (HOSPITAL_COMMUNITY)
Admission: RE | Admit: 2020-08-29 | Discharge: 2020-08-29 | Disposition: A | Discharge status: Home / Self Care | Payer: Medicare HMO | Source: Ambulatory Visit | Attending: Neurology | Admitting: Neurology

## 2020-09-06 ENCOUNTER — Encounter (HOSPITAL_COMMUNITY): Payer: Self-pay

## 2020-09-06 ENCOUNTER — Other Ambulatory Visit: Payer: Self-pay

## 2020-09-06 ENCOUNTER — Encounter (HOSPITAL_COMMUNITY)
Admission: RE | Admit: 2020-09-06 | Discharge: 2020-09-06 | Disposition: A | Discharge status: Home / Self Care | Payer: Medicare HMO | Source: Ambulatory Visit | Attending: Neurology | Admitting: Neurology

## 2020-09-06 DIAGNOSIS — G360 Neuromyelitis optica [Devic]: Secondary | ICD-10-CM | POA: Diagnosis not present

## 2020-09-06 MED ORDER — SODIUM CHLORIDE 0.9 % IV SOLN
1200.0000 mg | Freq: Once | INTRAVENOUS | Status: AC
  Administered 2020-09-06: 1200 mg via INTRAVENOUS
  Filled 2020-09-06: qty 120

## 2020-09-06 MED ORDER — SODIUM CHLORIDE 0.9 % IV SOLN: INTRAVENOUS | Status: DC

## 2020-09-06 MED ORDER — NON FORMULARY: 1200.0000 mg | Status: DC

## 2020-09-10 DIAGNOSIS — G43709 Chronic migraine without aura, not intractable, without status migrainosus: Secondary | ICD-10-CM | POA: Diagnosis not present

## 2020-09-10 DIAGNOSIS — M130 Polyarthritis, unspecified: Secondary | ICD-10-CM | POA: Diagnosis not present

## 2020-09-10 DIAGNOSIS — M5416 Radiculopathy, lumbar region: Secondary | ICD-10-CM | POA: Diagnosis not present

## 2020-09-10 DIAGNOSIS — M79605 Pain in left leg: Secondary | ICD-10-CM | POA: Diagnosis not present

## 2020-09-10 DIAGNOSIS — G43711 Chronic migraine without aura, intractable, with status migrainosus: Secondary | ICD-10-CM | POA: Diagnosis not present

## 2020-09-10 DIAGNOSIS — R251 Tremor, unspecified: Secondary | ICD-10-CM | POA: Diagnosis not present

## 2020-09-10 DIAGNOSIS — R252 Cramp and spasm: Secondary | ICD-10-CM | POA: Diagnosis not present

## 2020-09-10 DIAGNOSIS — M545 Low back pain, unspecified: Secondary | ICD-10-CM | POA: Diagnosis not present

## 2020-09-10 DIAGNOSIS — H9209 Otalgia, unspecified ear: Secondary | ICD-10-CM | POA: Diagnosis not present

## 2020-09-10 DIAGNOSIS — Z79891 Long term (current) use of opiate analgesic: Secondary | ICD-10-CM | POA: Diagnosis not present

## 2020-09-13 ENCOUNTER — Telehealth: Payer: Self-pay | Admitting: *Deleted

## 2020-09-13 NOTE — Telephone Encounter (Signed)
Submitted PA [facility] on CMM. Key: [id number]. - PA Case ID: [id number]. Waiting on determination from [place]. Marked urgent.

## 2020-09-13 NOTE — Telephone Encounter (Signed)
PA approved via [facility] 05/05/20-05/04/21.  PA Case ID: [id number] .Faxed updated orders to [facility] at [phone]. Received fax confirmation.

## 2020-09-20 ENCOUNTER — Encounter (HOSPITAL_COMMUNITY): Admission: RE | Admit: 2020-09-20 | Payer: Medicare HMO | Source: Ambulatory Visit

## 2020-09-27 ENCOUNTER — Ambulatory Visit: Payer: Medicare HMO | Admitting: Family Medicine

## 2020-10-04 ENCOUNTER — Other Ambulatory Visit: Payer: Self-pay

## 2020-10-04 ENCOUNTER — Encounter (HOSPITAL_COMMUNITY)
Admission: RE | Admit: 2020-10-04 | Discharge: 2020-10-04 | Disposition: A | Discharge status: Home / Self Care | Payer: Medicare HMO | Source: Ambulatory Visit | Attending: Neurology | Admitting: Neurology

## 2020-10-04 DIAGNOSIS — G360 Neuromyelitis optica [Devic]: Secondary | ICD-10-CM | POA: Diagnosis not present

## 2020-10-04 MED ORDER — ECULIZUMAB 300 MG/30ML IV SOLN
1200.0000 mg | INTRAVENOUS | Status: DC
  Filled 2020-10-04: qty 120

## 2020-10-04 MED ORDER — SODIUM CHLORIDE 0.9 % IV SOLN: INTRAVENOUS | Status: DC

## 2020-10-04 MED ORDER — SODIUM CHLORIDE 0.9 % IV SOLN
1200.0000 mg | Freq: Once | INTRAVENOUS | Status: AC
  Administered 2020-10-04: 1200 mg via INTRAVENOUS
  Filled 2020-10-04: qty 120

## 2020-10-25 DIAGNOSIS — G43709 Chronic migraine without aura, not intractable, without status migrainosus: Secondary | ICD-10-CM | POA: Diagnosis not present

## 2020-10-25 DIAGNOSIS — R251 Tremor, unspecified: Secondary | ICD-10-CM | POA: Diagnosis not present

## 2020-10-25 DIAGNOSIS — R252 Cramp and spasm: Secondary | ICD-10-CM | POA: Diagnosis not present

## 2020-10-25 DIAGNOSIS — M545 Low back pain, unspecified: Secondary | ICD-10-CM | POA: Diagnosis not present

## 2020-10-25 DIAGNOSIS — M130 Polyarthritis, unspecified: Secondary | ICD-10-CM | POA: Diagnosis not present

## 2020-10-25 DIAGNOSIS — Z79891 Long term (current) use of opiate analgesic: Secondary | ICD-10-CM | POA: Diagnosis not present

## 2020-10-25 DIAGNOSIS — M5416 Radiculopathy, lumbar region: Secondary | ICD-10-CM | POA: Diagnosis not present

## 2020-10-25 DIAGNOSIS — G43711 Chronic migraine without aura, intractable, with status migrainosus: Secondary | ICD-10-CM | POA: Diagnosis not present

## 2020-10-25 DIAGNOSIS — M79605 Pain in left leg: Secondary | ICD-10-CM | POA: Diagnosis not present

## 2020-10-25 DIAGNOSIS — J309 Allergic rhinitis, unspecified: Secondary | ICD-10-CM | POA: Diagnosis not present

## 2020-10-30 ENCOUNTER — Telehealth: Payer: Self-pay

## 2020-10-30 NOTE — Telephone Encounter (Signed)
Called and lvm to see if she wants to come in tomorrow 6/29 and see Amy,NP. Times available at the moment are: 11, 11:30, and 3. If pt calls back and times are open please move her appt for either times.

## 2020-11-01 ENCOUNTER — Encounter (HOSPITAL_COMMUNITY): Payer: Medicare HMO

## 2020-11-07 ENCOUNTER — Other Ambulatory Visit: Payer: Self-pay

## 2020-11-07 ENCOUNTER — Encounter (HOSPITAL_COMMUNITY)
Admission: RE | Admit: 2020-11-07 | Discharge: 2020-11-07 | Disposition: A | Discharge status: Home / Self Care | Payer: Medicare HMO | Source: Ambulatory Visit | Attending: Neurology | Admitting: Neurology

## 2020-11-07 DIAGNOSIS — G360 Neuromyelitis optica [Devic]: Secondary | ICD-10-CM | POA: Diagnosis present

## 2020-11-07 MED ORDER — NON FORMULARY: 1200.0000 mg | Freq: Once | Status: DC

## 2020-11-07 MED ORDER — SODIUM CHLORIDE 0.9 % IV SOLN: INTRAVENOUS | Status: DC

## 2020-11-07 MED ORDER — SODIUM CHLORIDE 0.9 % IV SOLN
1200.0000 mg | Freq: Once | INTRAVENOUS | Status: AC
  Administered 2020-11-07: 1200 mg via INTRAVENOUS
  Filled 2020-11-07: qty 120

## 2020-11-09 ENCOUNTER — Telehealth: Payer: Self-pay | Admitting: Neurology

## 2020-11-09 NOTE — Telephone Encounter (Signed)
Pt states she has pain in her left side and her left foot is swollen.  Pt would like to know if Dr Felecia Shelling will order a MRI for her at [facility] in [place].  Please call

## 2020-11-09 NOTE — Telephone Encounter (Signed)
Called the pt back to get more information. There was no answer and Mailbox was full.   **If the patient calls back, I would recommend the pt reach out to her Primary care provider first to see if they can have her seen/evaluated for her foot as well as order the imaging.

## 2020-11-12 DIAGNOSIS — F17210 Nicotine dependence, cigarettes, uncomplicated: Secondary | ICD-10-CM | POA: Diagnosis not present

## 2020-11-12 DIAGNOSIS — F1320 Sedative, hypnotic or anxiolytic dependence, uncomplicated: Secondary | ICD-10-CM | POA: Diagnosis not present

## 2020-11-12 DIAGNOSIS — R69 Illness, unspecified: Secondary | ICD-10-CM | POA: Diagnosis not present

## 2020-11-12 DIAGNOSIS — R2681 Unsteadiness on feet: Secondary | ICD-10-CM | POA: Diagnosis not present

## 2020-11-12 DIAGNOSIS — M329 Systemic lupus erythematosus, unspecified: Secondary | ICD-10-CM | POA: Diagnosis not present

## 2020-11-12 DIAGNOSIS — R52 Pain, unspecified: Secondary | ICD-10-CM | POA: Diagnosis not present

## 2020-11-12 DIAGNOSIS — Z299 Encounter for prophylactic measures, unspecified: Secondary | ICD-10-CM | POA: Diagnosis not present

## 2020-11-12 DIAGNOSIS — K5732 Diverticulitis of large intestine without perforation or abscess without bleeding: Secondary | ICD-10-CM | POA: Diagnosis not present

## 2020-11-12 DIAGNOSIS — R030 Elevated blood-pressure reading, without diagnosis of hypertension: Secondary | ICD-10-CM | POA: Diagnosis not present

## 2020-11-12 DIAGNOSIS — G360 Neuromyelitis optica [Devic]: Secondary | ICD-10-CM | POA: Diagnosis not present

## 2020-11-12 DIAGNOSIS — Z6821 Body mass index (BMI) 21.0-21.9, adult: Secondary | ICD-10-CM | POA: Diagnosis not present

## 2020-11-12 NOTE — Telephone Encounter (Signed)
Called and spoke w/ pt. She reports left sided pain near large intestines/pancreas. Also has L swollen foot. Sx started last Thursday and has continued. She has not contacted PCP. I recommended she contact PCP to see if they can fit her in for further evaluation/treatment. They can order imaging if needed. They will then determine if she needs to follow up w/ neurology. She verbalized understanding and will contact PCP.

## 2020-11-13 ENCOUNTER — Other Ambulatory Visit: Payer: Self-pay

## 2020-11-13 ENCOUNTER — Emergency Department (HOSPITAL_COMMUNITY): Payer: Medicare HMO

## 2020-11-13 ENCOUNTER — Emergency Department (HOSPITAL_COMMUNITY)
Admission: EM | Admit: 2020-11-13 | Discharge: 2020-11-13 | Disposition: A | Discharge status: Home / Self Care | Payer: Medicare HMO | Attending: Emergency Medicine | Admitting: Emergency Medicine

## 2020-11-13 ENCOUNTER — Encounter (HOSPITAL_COMMUNITY): Payer: Self-pay | Admitting: Emergency Medicine

## 2020-11-13 DIAGNOSIS — R109 Unspecified abdominal pain: Secondary | ICD-10-CM | POA: Diagnosis not present

## 2020-11-13 DIAGNOSIS — K219 Gastro-esophageal reflux disease without esophagitis: Secondary | ICD-10-CM | POA: Diagnosis not present

## 2020-11-13 DIAGNOSIS — R1032 Left lower quadrant pain: Secondary | ICD-10-CM | POA: Diagnosis not present

## 2020-11-13 DIAGNOSIS — R69 Illness, unspecified: Secondary | ICD-10-CM | POA: Diagnosis not present

## 2020-11-13 DIAGNOSIS — R1012 Left upper quadrant pain: Secondary | ICD-10-CM | POA: Insufficient documentation

## 2020-11-13 DIAGNOSIS — E039 Hypothyroidism, unspecified: Secondary | ICD-10-CM | POA: Insufficient documentation

## 2020-11-13 DIAGNOSIS — F17210 Nicotine dependence, cigarettes, uncomplicated: Secondary | ICD-10-CM | POA: Diagnosis not present

## 2020-11-13 DIAGNOSIS — Z9049 Acquired absence of other specified parts of digestive tract: Secondary | ICD-10-CM | POA: Diagnosis not present

## 2020-11-13 LAB — CBC WITH DIFFERENTIAL/PLATELET
Abs Immature Granulocytes: 0.01 10*3/uL (ref 0.00–0.07)
Basophils Absolute: 0 10*3/uL (ref 0.0–0.1)
Basophils Relative: 1 %
Eosinophils Absolute: 0.1 10*3/uL (ref 0.0–0.5)
Eosinophils Relative: 1 %
HCT: 41.3 % (ref 36.0–46.0)
Hemoglobin: 13.7 g/dL (ref 12.0–15.0)
Immature Granulocytes: 0 %
Lymphocytes Relative: 21 %
Lymphs Abs: 1.1 10*3/uL (ref 0.7–4.0)
MCH: 30.6 pg (ref 26.0–34.0)
MCHC: 33.2 g/dL (ref 30.0–36.0)
MCV: 92.4 fL (ref 80.0–100.0)
Monocytes Absolute: 0.4 10*3/uL (ref 0.1–1.0)
Monocytes Relative: 7 %
Neutro Abs: 3.5 10*3/uL (ref 1.7–7.7)
Neutrophils Relative %: 70 %
Platelets: 151 10*3/uL (ref 150–400)
RBC: 4.47 MIL/uL (ref 3.87–5.11)
RDW: 13.9 % (ref 11.5–15.5)
WBC: 5 10*3/uL (ref 4.0–10.5)
nRBC: 0 % (ref 0.0–0.2)

## 2020-11-13 LAB — COMPREHENSIVE METABOLIC PANEL
ALT: 9 U/L (ref 0–44)
AST: 14 U/L — ABNORMAL LOW (ref 15–41)
Albumin: 4 g/dL (ref 3.5–5.0)
Alkaline Phosphatase: 86 U/L (ref 38–126)
Anion gap: 6 (ref 5–15)
BUN: 12 mg/dL (ref 8–23)
CO2: 28 mmol/L (ref 22–32)
Calcium: 8.9 mg/dL (ref 8.9–10.3)
Chloride: 101 mmol/L (ref 98–111)
Creatinine, Ser: 0.85 mg/dL (ref 0.44–1.00)
GFR, Estimated: >60 mL/min (ref >60)
Glucose, Bld: 90 mg/dL (ref 70–99)
Potassium: 3.9 mmol/L (ref 3.5–5.1)
Sodium: 135 mmol/L (ref 135–145)
Total Bilirubin: 0.5 mg/dL (ref 0.3–1.2)
Total Protein: 7.6 g/dL (ref 6.5–8.1)

## 2020-11-13 LAB — URINALYSIS, ROUTINE W REFLEX MICROSCOPIC
Bilirubin Urine: NEGATIVE
Glucose, UA: NEGATIVE mg/dL
Hgb urine dipstick: NEGATIVE
Ketones, ur: NEGATIVE mg/dL
Leukocytes,Ua: NEGATIVE
Nitrite: NEGATIVE
Protein, ur: NEGATIVE mg/dL
Specific Gravity, Urine: 1.006 (ref 1.005–1.030)
pH: 7 (ref 5.0–8.0)

## 2020-11-13 LAB — LIPASE, BLOOD: Lipase: 25 U/L (ref 11–51)

## 2020-11-13 MED ORDER — FENTANYL CITRATE (PF) 100 MCG/2ML IJ SOLN
50.0000 ug | Freq: Once | INTRAMUSCULAR | Status: AC
Start: 2020-11-13 — End: 2020-11-13
  Administered 2020-11-13: 50 ug via INTRAVENOUS
  Filled 2020-11-13: qty 2

## 2020-11-13 MED ORDER — IOHEXOL 300 MG/ML  SOLN
75.0000 mL | Freq: Once | INTRAMUSCULAR | Status: AC | PRN
  Administered 2020-11-13: 75 mL via INTRAVENOUS

## 2020-11-13 NOTE — Discharge Instructions (Addendum)
Follow-up with your primary care provider this week for recheck.  As discussed, return to the emergency department if you develop any worsening pain of your abdomen, fever, chills, vomiting or chest pain or shortness of breath.

## 2020-11-13 NOTE — ED Provider Notes (Signed)
Emergency Medicine Provider Triage Evaluation Note  Susan Hodge , a 70 y.o. female  was evaluated in triage.  Pt complains of left lower quadrant pain which is now radiating to her left upper abdomen.  She was seen by her pcp yesterday who put her on cipro for presumptive uti but sx persist.  Review of Systems  Positive: Abdominal pain Negative: Fevers, n/v, abdominal distention.  No flank pain, no dysuria or hematuria.   Physical Exam  BP 123/78 (BP Location: Right Arm)   Pulse 71   Temp 97.9 F (36.6 C) (Oral)   Resp 18   Ht 5' (1.524 m)   Wt 52.2 kg   SpO2 100%   BMI 22.46 kg/m  Gen:   Awake, no distress   Resp:  Normal effort  MSK:   Moves extremities without difficulty  Other:  TTP left sided abdomen, lower greater than upper, no guarding, no cva ttp.   Medical Decision Making  Medically screening exam initiated at 3:38 PM.  Appropriate orders placed.  [name] was informed that the remainder of the evaluation will be completed by another provider, this initial triage assessment does not replace that evaluation, and the importance of remaining in the ED until their evaluation is complete.  Pt with left sided abdominal pain not improving with cipro, labs and urine ordered.  Pt may need CT imaging pending lab results.    Evalee Jefferson, PA-C 11/13/20 [place], Ankit, MD 11/13/20 [phone]

## 2020-11-13 NOTE — ED Triage Notes (Signed)
Pt c/o left flank pain since Friday. Pt seen PCP yesterday and dx with UTI, started taking cipro. Pt on chronic pain medications at home.

## 2020-11-21 ENCOUNTER — Other Ambulatory Visit: Payer: Self-pay

## 2020-11-21 ENCOUNTER — Encounter (HOSPITAL_COMMUNITY)
Admission: RE | Admit: 2020-11-21 | Discharge: 2020-11-21 | Disposition: A | Discharge status: Home / Self Care | Payer: Medicare HMO | Source: Ambulatory Visit | Attending: Neurology | Admitting: Neurology

## 2020-11-21 DIAGNOSIS — G360 Neuromyelitis optica [Devic]: Secondary | ICD-10-CM | POA: Diagnosis not present

## 2020-11-21 MED ORDER — SODIUM CHLORIDE 0.9 % IV SOLN: Freq: Once | INTRAVENOUS | Status: AC

## 2020-11-21 MED ORDER — NON FORMULARY: 1200.0000 mg | Freq: Once | Status: DC

## 2020-11-21 MED ORDER — SODIUM CHLORIDE 0.9 % IV SOLN
1200.0000 mg | Freq: Once | INTRAVENOUS | Status: AC
  Administered 2020-11-21: 1200 mg via INTRAVENOUS
  Filled 2020-11-21: qty 120

## 2020-12-02 NOTE — ED Provider Notes (Signed)
[facility] EMERGENCY DEPARTMENT Provider Note   CSN: [id number] Arrival date & time: 11/13/20  1500        History Chief Complaint  Patient presents with   Flank Pain    Susan Hodge is a 70 y.o. female.   Flank Pain Associated symptoms include abdominal pain. Pertinent negatives include no chest pain, no headaches and no shortness of breath.      Susan Hodge is a 70 y.o. female who presents to the Emergency Department complaining of left abdominal pain for several days.  She describes the pain as aching and initially pain was mainly located at left lower abdomen, but now has pain to the upper abdomen as well.  She was seen by PCP one day prior and starting on Cipro.  She denies fever, chills, nausea and vomiting, dysuria or hematuria.   Past Medical History:  Diagnosis Date   Chest pain    Collagen vascular disease ([place])    Complication of anesthesia    Blisters to mouth.   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   Left hemiplegia ([place]) 09/09/2018   Dysesthesia 09/09/2018   AKI (acute kidney injury) ([place])    Neurogenic bowel    Neurogenic bladder    Anxiety state    Acute blood loss anemia    Hypoalbuminemia due to protein-calorie malnutrition ([facility])    Elevated BUN    Postoperative pain    Chronic pain syndrome    Myelitis ([place])  07/31/2018   Leukocytosis 07/31/2018   Anxiety 07/31/2018   GERD (gastroesophageal reflux disease) 07/31/2018   ANXIETY DISORDER, GENERALIZED 07/23/2009   UNSPECIFIED HYPOTHYROIDISM 06/21/2009   NONDEPENDENT TOBACCO USE DISORDER 06/21/2009   DEPRESSIVE DISORDER NOT ELSEWHERE CLASSIFIED 06/21/2009   INTERMEDIATE CORONARY SYNDROME 06/21/2009   LUPUS NEPHRITIS 06/21/2009   UNSPECIFIED MYALGIA AND MYOSITIS 06/21/2009   PRECORDIAL PAIN 06/21/2009    Past Surgical History:  Procedure Laterality Date   BILATERAL OOPHORECTOMY     CARDIAC CATHETERIZATION     CHOLECYSTECTOMY     COLONOSCOPY WITH PROPOFOL N/A 01/03/2019   Procedure: COLONOSCOPY WITH PROPOFOL;  Surgeon: Rogene Houston, MD;  Location: [facility];  Service: Endoscopy;  Laterality: N/A;   ESOPHAGOGASTRODUODENOSCOPY (EGD) WITH PROPOFOL N/A 01/02/2019   Procedure: ESOPHAGOGASTRODUODENOSCOPY (EGD) WITH PROPOFOL;  Surgeon: Rogene Houston, MD;  Location: [facility];  Service: Endoscopy;  Laterality: N/A;   POLYPECTOMY  01/03/2019   Procedure: POLYPECTOMY;  Surgeon: Rogene Houston, MD;  Location: [facility];  Service: Endoscopy;;     OB History   No obstetric history on file.     Family History  Problem Relation Age of Onset   Stroke Father     Social History   Tobacco Use   Smoking status: Every Day  Packs/day: 0.50    Types: Cigarettes   Smokeless tobacco: Never   Tobacco comments:    quit last week 01/03/19  Vaping Use   Vaping Use: Never used  Substance Use Topics   Alcohol use: No   Drug use: Never    Home Medications Prior to Admission medications   Medication Sig Start Date End Date Taking? Authorizing Provider  acetaminophen (TYLENOL) 325 MG tablet Take 2 tablets (650 mg total) by mouth every 6 (six) hours as needed for mild pain (or Fever >/= 101). 08/18/18   Angiulli, Lavon Paganini, PA-C  ALPRAZolam Duanne Moron) 0.5 MG tablet Take 1 tablet (0.5 mg total) by mouth 3 (three) times daily as needed for  anxiety or sleep. 01/03/19   Roxan Hockey, MD  amoxicillin (AMOXIL) 250 MG capsule Take 250 mg by mouth 3 (three) times daily as needed (for ear infection/inflammation).     [provider]  baclofen (LIORESAL) 10 MG tablet 1/2 to one pill up to 5 a day 12/20/19   Sater, Nanine Means, MD  Butalbital-APAP-Caffeine (FIORICET PO) Take by mouth.    [provider]  Cyanocobalamin (VITAMIN B-12 PO) Take 2 tablets by mouth daily.    [provider]  darifenacin (ENABLEX) 15 MG 24 hr tablet TAKE 1 TABLET BY MOUTH DAILY 06/09/19   Lomax, Amy, NP  DULoxetine (CYMBALTA) 20 MG capsule duloxetine 20 mg capsule,delayed release  Take 1 capsule every day by oral route.    [provider]  Eculizumab (SOLIRIS IV) Inject 1,200 mg into the vein every 14 (fourteen) days.     Sater, Nanine Means, MD  Galcanezumab-gnlm (EMGALITY) 120 MG/ML SOAJ Inject 120 mg into the skin every 30 (thirty) days.    [provider]  lidocaine (LIDODERM) 5 % Place 1 patch onto the skin every 12 (twelve) hours. 12/01/18   [provider]  loratadine (CLARITIN) 10 MG tablet Take 10 mg by mouth daily.    [provider]  ondansetron (ZOFRAN) 4 MG tablet Take 1 tablet (4 mg total) by mouth every 6 (six) hours as needed for nausea. 01/03/19   Roxan Hockey, MD  oxyCODONE-acetaminophen (PERCOCET) 10-325 MG tablet Take 1 tablet by mouth every 6 (six) hours as needed. 01/03/19   Roxan Hockey, MD  pantoprazole (PROTONIX) 40 MG tablet Take 1 tablet (40 mg total) by mouth 2 (two) times daily before a meal. 01/03/19   Emokpae, Courage, MD  polyethylene glycol (MIRALAX) 17 g packet Take 17 g by mouth every morning. 01/03/19   Roxan Hockey, MD  potassium chloride SA (KLOR-CON) 20 MEQ tablet Take 20 mEq by mouth 2 (two) times daily.    [provider]  pregabalin (LYRICA) 50 MG capsule Take 50 mg by mouth 3 (three) times daily.    [provider]  UNABLE TO FIND Med Name:  CBD OIL that she uses topically for pain.    [provider]  UNABLE TO FIND daily. Med Name: fiber gummies    [provider]  valproic acid (DEPAKENE) 250 MG capsule Take 1 capsule (250 mg total) by mouth at bedtime. 08/18/18   Angiulli, Lavon Paganini, PA-C  Vitamin D, Ergocalciferol, (DRISDOL) 1.25 MG (50000 UT) CAPS capsule Take 1 capsule (50,000 Units total) by mouth every Monday. 08/23/18   Angiulli, Lavon Paganini, PA-C  vitamin E 1000 UNIT capsule Take 1,000 Units by mouth daily.    [provider]    Allergies    Prednisone, Amitriptyline, Bee venom, Cymbalta [duloxetine  hcl], Fexofenadine, Furosemide, Gabapentin, Hydroxychloroquine sulfate, Morphine, Nsaids, Oxycodone, and Topiramate  Review of Systems   Review of Systems  Constitutional:  Negative for chills, fatigue and fever.  Respiratory:  Negative for cough and shortness of breath.   Cardiovascular:  Negative for chest pain.  Gastrointestinal:  Positive for abdominal pain. Negative for diarrhea, nausea and vomiting.  Genitourinary:  Positive for flank pain. Negative for decreased urine volume, difficulty urinating, dysuria and hematuria.  Musculoskeletal:  Negative for arthralgias, back pain and myalgias.  Skin:  Negative for rash.  Neurological:  Negative for dizziness, syncope, weakness, numbness and headaches.  Hematological:  Does not bruise/bleed easily.   Physical Exam Updated Vital Signs BP 125/60   Pulse 62   Temp 98.1 F (36.7 C) (Oral)   Resp 20   Ht 5' (1.524 m)   Wt 52.2 kg   SpO2 100%   BMI 22.46 kg/m   Physical Exam Vitals and nursing note reviewed.  Constitutional:      General: She is not in acute distress.    Appearance: Normal appearance. She is not ill-appearing.  HENT:     Head: Normocephalic.     Mouth/Throat:     Mouth: Mucous membranes are moist.  Cardiovascular:     Rate and Rhythm: Normal rate and regular rhythm.     Pulses: Normal pulses.  Pulmonary:     Effort:  Pulmonary effort is normal.     Breath sounds: Normal breath sounds. No wheezing.  Abdominal:     Palpations: Abdomen is soft.     Tenderness: There is abdominal tenderness. There is no right CVA tenderness, left CVA tenderness, guarding or rebound.     Comments: Ttp of the left mid to lower abdomen.   No guarding or rebound tenderness.  No CVA tenderness.   Musculoskeletal:        General: Normal range of motion.  Skin:    General: Skin is warm.     Capillary Refill: Capillary refill takes less than 2 seconds.     Findings: No rash.  Neurological:     General: No focal deficit present.     Mental Status: She is alert.     Motor: No weakness.    ED Results / Procedures / Treatments   Labs (all labs ordered are listed, but only abnormal results are displayed) Labs Reviewed  URINALYSIS, ROUTINE W REFLEX MICROSCOPIC - Abnormal; Notable for the following components:      Result Value   Color, Urine STRAW (*)    All other components within normal limits  COMPREHENSIVE METABOLIC PANEL - Abnormal; Notable for the following components:   AST 14 (*)    All other components within normal limits  CBC WITH DIFFERENTIAL/PLATELET  LIPASE, BLOOD    EKG None  Radiology CT ABDOMEN PELVIS W CONTRAST  Result Date: 11/13/2020 CLINICAL DATA:  Left side abdominal pain for 4 days. EXAM: CT ABDOMEN AND PELVIS WITH CONTRAST TECHNIQUE: Multidetector CT imaging of the abdomen and pelvis was performed using the standard protocol following bolus administration of intravenous contrast. CONTRAST:  75 mL OMNIPAQUE IOHEXOL 300 MG/ML  SOLN COMPARISON:  CT abdomen and pelvis 12/31/2018. FINDINGS: Lower chest: Lung bases clear.  No pleural or pericardial effusion. Hepatobiliary: No focal liver abnormality is seen. No gallstones, gallbladder wall thickening, or biliary dilatation. Pancreas: Unremarkable. No pancreatic ductal dilatation or surrounding inflammatory changes. Spleen: Normal in size without focal  abnormality. Adrenals/Urinary Tract: Adrenal glands are unremarkable. Kidneys are normal, without renal  calculi, focal lesion, or hydronephrosis. Bladder is unremarkable. Stomach/Bowel: Stomach is within normal limits. The appendix is not seen but there is no evidence of appendicitis. No evidence of bowel wall thickening, distention, or inflammatory changes. Mild diverticulosis noted. Vascular/Lymphatic: Aortic atherosclerosis. No enlarged abdominal or pelvic lymph nodes. Reproductive: Adnexa are negative. Low attenuating lesion in the fundus of the uterus is likely a degenerated fibroid and unchanged. Other: Negative Musculoskeletal: No acute or focal abnormality. IMPRESSION: No acute abnormality abdomen or pelvis. No finding to explain the patient's symptoms. Mild diverticulosis. Aortic Atherosclerosis (ICD10-I70.0). Electronically Signed   By: Inge Rise M.D.   On: 11/13/2020 20:59     Procedures Procedures   Medications Ordered in ED Medications  fentaNYL (SUBLIMAZE) injection 50 mcg (50 mcg Intravenous Given 11/13/20 1941)  iohexol (OMNIPAQUE) 300 MG/ML solution 75 mL (75 mLs Intravenous Contrast Given 11/13/20 2027)    ED Course  I have reviewed the triage vital signs and the nursing notes.  Pertinent labs & imaging results that were available during my care of the patient were reviewed by me and considered in my medical decision making (see chart for details).    MDM Rules/Calculators/A&P                           Pt here for eval of left abdominal pain, previously treated for possible UTI, started on Cipro one day prior.  Continues having pain.  No fever, vomiting or dysuria sx's  Pt non toxic appearing.  Vitals reviewed.   Labs unremarkable.  U/A w/o evidence of infection.  CT abd/pelvis w/o acute findings.  Source of pt's sx's unclear.  Doubt emergent process.  On recheck, feeling better after pain medication.  Appears appropriate for d/c home.  Will continue her Cipro and  arrange close f/u with PCP.    Return precautions discussed.    Final Clinical Impression(s) / ED Diagnoses Final diagnoses:  Left lower quadrant abdominal pain    Rx / DC Orders ED Discharge Orders     None        Kem Parkinson, PA-C 12/02/20 2246    Varney Biles, MD 12/04/20 1646

## 2020-12-05 ENCOUNTER — Encounter (HOSPITAL_COMMUNITY)
Admission: RE | Admit: 2020-12-05 | Discharge: 2020-12-05 | Disposition: A | Discharge status: Home / Self Care | Payer: Medicare HMO | Source: Ambulatory Visit | Attending: Neurology | Admitting: Neurology

## 2020-12-05 ENCOUNTER — Other Ambulatory Visit: Payer: Self-pay

## 2020-12-05 DIAGNOSIS — G360 Neuromyelitis optica [Devic]: Secondary | ICD-10-CM | POA: Insufficient documentation

## 2020-12-05 MED ORDER — SODIUM CHLORIDE 0.9 % IV SOLN
1200.0000 mg | Freq: Once | INTRAVENOUS | Status: AC
  Administered 2020-12-05: 1200 mg via INTRAVENOUS
  Filled 2020-12-05: qty 120

## 2020-12-05 MED ORDER — SODIUM CHLORIDE 0.9 % IV SOLN: Freq: Once | INTRAVENOUS | Status: AC

## 2020-12-05 MED ORDER — NON FORMULARY: 1200.0000 mg | Freq: Once | Status: DC

## 2020-12-10 ENCOUNTER — Telehealth: Payer: Self-pay | Admitting: *Deleted

## 2020-12-10 NOTE — Telephone Encounter (Signed)
Called pt insurance. They had me complete a new prior authorization because the current approval done this past May was under pharmacy, not medical. In order to have place of service as outpatient, it has to be under medical.  Here is the new authorization information: Approved 12/10/20-06/12/21. Auth#[id number] .Buy/bill. They could not back date this auth. They said infusion suite can contact the claims department to ask for coverage of denied DOS. If denied, can appeal with the claims department stating there was a gap in the [place].   I emailed [employer] suite with this update.

## 2020-12-11 ENCOUNTER — Telehealth: Payer: Self-pay | Admitting: Family Medicine

## 2020-12-11 ENCOUNTER — Ambulatory Visit: Payer: Medicare HMO | Admitting: Family Medicine

## 2020-12-11 NOTE — Telephone Encounter (Signed)
Pt not feeling well, unable to make it to her appt today.

## 2020-12-11 NOTE — Progress Notes (Deleted)
PATIENT: Susan Hodge DOB: [birth year]  REASON FOR VISIT: follow up HISTORY FROM: patient  No chief complaint on file.    HISTORY OF PRESENT ILLNESS: 12/11/2020 ALL: Susan Hodge returns for follow up for NMO. She continues regular follow up with Dr Merlene Laughter and yearly follow up with [place]. She continues IV Solaris.   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  She is receiving the medication closer to her home at [facility] and infusion is under an hour.  She tolerates it well.  She has not had any relapses since starting.  This compares to several relapses in rapid succession earlier in 2020.   She goes about 50 feet and sometimes more with a walker.    She has weakness and spasticity, left > right.     She notes some shaking in the left side of her body at times.She has left leg tingling.    Spasms also involve the trunk.     She takes baclofen with benefit 5 mg tid.    Vision is fine.    She has urinary urgency, helped by Enablex.   She also has back pain leg pain.   An ESI has not helped and actually made her feel worse.   She has had issues tolerating steroids.        She has chronic migraines and takes [employer].  This is helping with less frequency and intensity.   She has had more this month than typical - a couple times a week. She takes Fioricet as needed.       Neuromyelitis optica history: She began to experience clumsiness in mid March.  She had been paced on topiramate for migraines and had recently and had some dizziness and vertigo initially.  She fell and a day later began to have urinary incontinence and inability to move the left leg.   She went to the ED at [facility] and an MRI of the lumbar spine shoowed degenerative disc changes.    She was prescribed a steroid pack and discharged.   The next day she was worse and her PCP sent her to the [facility] ED.   She had MRI of the brain and cervical and thoracic spine.   The MRI of the brain  showed a remote right MCA stroke (she never had symptoms).   MRI of the spine showed multifocal lesions within the spinal cord, centrally but asymmetric.       She received 5 days of IV Solu-Medrol in the hospital.   She initially improved after the steroids and was using a walker well a couple weeks later at discharge from rehab.  Balance was still poor but strength was good.   Of note she had no arm weakness in March or April.   Over the past couple days, she has felt much weaker with more left leg > arm weakness.  .  She also has had right flank numbness and a dysesthetic sensation (pressure like pain) since yesterday.      Bladder impoved with steroids and is continuing to do well (she is on Enablex).    She had the meningitis vaccination (both the [place] and the B shots) and started [facility] June 2020.     MRIs The MRI of the brain 07/31/2018 shows a remote right MCA infarction with a small amount of associated hemosiderin.  There are also some scattered T2/flair hyperintense foci consistent with mild chronic microvascular ischemic  changes.  There were no acute findings.  The MRI of the cervical spine shows T2 hyperintense signal within the spinal cord posteriorly to the right adjacent to C2, posterior to the left adjacent to C3, and a little more to the right but central adjacent to C7.  The foci adjacent to C3 and C7 does have subtle enhancement after gadolinium was infused.  There are some degenerative changes at C4-C5, C5-C6 and C6-C7 but no definite nerve root compression and no spinal stenosis.   MRI of the thoracic spine shows diffuse patchy T2 hyperintensity that is predominantly central.   Number spine shows the abnormal signal within the spinal cord adjacent to T10 and T11.  There are degenerative changes at L3-L4, L4-L5 and L5-S1.  There is spinal stenosis at L4-L5.  With possibility for left L4 and L5 nerve root   Labs: Neuromyelitis optica IgG antibody was positive at 139 (< 3 normal) at  diagnosis.  Lumbar puncture showed a small elevation of white blood cell count, predominantly lymphocytes and macrophages.  B12, copper were normal.  Herpes simplex virus and HIV were negative.  ACE was normal.   REVIEW OF SYSTEMS: Out of a complete 14 system review of symptoms, the patient complains only of the following symptoms, muscle spasms, weakness, dysuria, left flank pain and all other reviewed systems are negative.  ALLERGIES: Allergies  Allergen Reactions   Prednisone Other (See Comments)    Not to be given at any point while patient is still taking Soliris infusions.  Severe shaking and dehydration.   Amitriptyline Other (See Comments)   Bee Venom Other (See Comments)   Cymbalta [Duloxetine Hcl] Nausea Only    Constipation    Fexofenadine Other (See Comments)   Furosemide Other (See Comments)   Gabapentin Other (See Comments)   Hydroxychloroquine Sulfate Other (See Comments)   Morphine Other (See Comments)   Nsaids    Oxycodone Other (See Comments)    dizzy   Topiramate Other (See Comments)    HOME MEDICATIONS: Outpatient Medications Prior to Visit  Medication Sig Dispense Refill   acetaminophen (TYLENOL) 325 MG tablet Take 2 tablets (650 mg total) by mouth every 6 (six) hours as needed for mild pain (or Fever >/= 101).     ALPRAZolam (XANAX) 0.5 MG tablet Take 1 tablet (0.5 mg total) by mouth 3 (three) times daily as needed for anxiety or sleep. 10 tablet 0   amoxicillin (AMOXIL) 250 MG capsule Take 250 mg by mouth 3 (three) times daily as needed (for ear infection/inflammation).      baclofen (LIORESAL) 10 MG tablet 1/2 to one pill up to 5 a day 150 tablet 11   Butalbital-APAP-Caffeine (FIORICET PO) Take by mouth.     Cyanocobalamin (VITAMIN B-12 PO) Take 2 tablets by mouth daily.     darifenacin (ENABLEX) 15 MG 24 hr tablet TAKE 1 TABLET BY MOUTH DAILY 30 tablet 5   DULoxetine (CYMBALTA) 20 MG capsule duloxetine 20 mg capsule,delayed release  Take 1 capsule every  day by oral route.     Eculizumab (SOLIRIS IV) Inject 1,200 mg into the vein every 14 (fourteen) days.      Galcanezumab-gnlm (EMGALITY) 120 MG/ML SOAJ Inject 120 mg into the skin every 30 (thirty) days.     lidocaine (LIDODERM) 5 % Place 1 patch onto the skin every 12 (twelve) hours.     loratadine (CLARITIN) 10 MG tablet Take 10 mg by mouth daily.     ondansetron (ZOFRAN) 4 MG tablet  Take 1 tablet (4 mg total) by mouth every 6 (six) hours as needed for nausea. 20 tablet 0   oxyCODONE-acetaminophen (PERCOCET) 10-325 MG tablet Take 1 tablet by mouth every 6 (six) hours as needed. 10 tablet 0   pantoprazole (PROTONIX) 40 MG tablet Take 1 tablet (40 mg total) by mouth 2 (two) times daily before a meal. 60 tablet 1   polyethylene glycol (MIRALAX) 17 g packet Take 17 g by mouth every morning. 30 each 3   potassium chloride SA (KLOR-CON) 20 MEQ tablet Take 20 mEq by mouth 2 (two) times daily.     pregabalin (LYRICA) 50 MG capsule Take 50 mg by mouth 3 (three) times daily.     UNABLE TO FIND Med Name: CBD OIL that she uses topically for pain.     UNABLE TO FIND daily. Med Name: fiber gummies     valproic acid (DEPAKENE) 250 MG capsule Take 1 capsule (250 mg total) by mouth at bedtime. 30 capsule 1   Vitamin D, Ergocalciferol, (DRISDOL) 1.25 MG (50000 UT) CAPS capsule Take 1 capsule (50,000 Units total) by mouth every Monday. 4 capsule 0   vitamin E 1000 UNIT capsule Take 1,000 Units by mouth daily.     No facility-administered medications prior to visit.    PAST MEDICAL HISTORY: Past Medical History:  Diagnosis Date   Chest pain    Collagen vascular disease ([place])    Complication of anesthesia    Blisters to mouth.   Depression    DJD (degenerative joint disease)    FH: CVA (cerebrovascular accident)    father   Fibromyalgia    GERD (gastroesophageal reflux disease)    Hypothyroidism    Lupus (H[facility])    Migraines    Palpitations    Status post cholecystectomy    appendectomy and  bilaateral oophorectomy   Tobacco use     PAST SURGICAL HISTORY: Past Surgical History:  Procedure Laterality Date   BILATERAL OOPHORECTOMY     CARDIAC CATHETERIZATION     CHOLECYSTECTOMY     COLONOSCOPY WITH PROPOFOL N/A 01/03/2019   Procedure: COLONOSCOPY WITH PROPOFOL;  Surgeon: Rogene Houston, MD;  Location: [facility];  Service: Endoscopy;  Laterality: N/A;   ESOPHAGOGASTRODUODENOSCOPY (EGD) WITH PROPOFOL N/A 01/02/2019   Procedure: ESOPHAGOGASTRODUODENOSCOPY (EGD) WITH PROPOFOL;  Surgeon: Rogene Houston, MD;  Location: [facility];  Service: Endoscopy;  Laterality: N/A;   POLYPECTOMY  01/03/2019   Procedure: POLYPECTOMY;  Surgeon: Rogene Houston, MD;  Location: [facility];  Service: Endoscopy;;    FAMILY HISTORY: Family History  Problem Relation Age of Onset   Stroke Father     SOCIAL HISTORY: Social History   Socioeconomic History   Marital status: Single    Spouse name: Not on file   Number of children: 3   Years of education: Not on file   Highest education level: Not on file  Occupational History   Occupation: disability  Tobacco Use   Smoking status: Every Day    Packs/day: 0.50    Types: Cigarettes   Smokeless tobacco: Never   Tobacco comments:    quit last week 01/03/19  Vaping Use   Vaping Use: Never used  Substance and Sexual Activity   Alcohol use: No   Drug use: Never   Sexual activity: Not on file  Other Topics Concern   Not on file  Social History Narrative   Friend lives with her   Caffeine use: coffee daily  Left handed    Social Determinants of Health   Financial Resource Strain: Not on file  Food Insecurity: Not on file  Transportation Needs: Not on file  Physical Activity: Not on file  Stress: Not on file  Social Connections: Not on file  Intimate Partner Violence: Not on file      PHYSICAL EXAM  There were no vitals filed for this visit.  There is no height or weight on file to calculate BMI.  Generalized:  Well developed, in no acute distress  Cardiology: normal rate and rhythm, no murmur noted Respiratory: clear to auscultation bilaterally  Neurological examination  Mentation: Alert oriented to time, place, history taking. Follows all commands speech and language fluent Cranial nerve II-XII: Pupils were equal round reactive to light. Extraocular movements were full, visual field were full on confrontational test. Facial sensation and strength were normal. Uvula tongue midline. Head turning and shoulder shrug  were normal and symmetric. Motor: The motor testing reveals 5 over 5 strength of bilateral upper extremities, 4-/5 left hip, 4+/5 left extension and flexion. 5/5 right lower. Slightly increased tone of left leg.   Sensory: Sensory testing is intact to soft touch on all 4 extremities. No evidence of extinction is noted.  Coordination: Cerebellar testing reveals good finger-nose-finger and heel-to-shin bilaterally.  Gait and station: Able to stand and transfer to bathroom toilet, does not feel strong enough to walk today (no walker, in wheelchair) Reflexes: Deep tendon reflexes are brisk symmetric and normal bilaterally. No obvious clonus, unable to get patient to fully relax lower extremities for exam.   DIAGNOSTIC DATA (LABS, IMAGING, TESTING) - I reviewed patient records, labs, notes, testing and imaging myself where available.  No flowsheet data found.   Lab Results  Component Value Date   WBC 5.0 11/13/2020   HGB 13.7 11/13/2020   HCT 41.3 11/13/2020   MCV 92.4 11/13/2020   PLT 151 11/13/2020      Component Value Date/Time   NA 135 11/13/2020 1547   NA 139 12/20/2019 1558   K 3.9 11/13/2020 1547   CL 101 11/13/2020 1547   CO2 28 11/13/2020 1547   GLUCOSE 90 11/13/2020 1547   BUN 12 11/13/2020 1547   BUN 8 12/20/2019 1558   CREATININE 0.85 11/13/2020 1547   CALCIUM 8.9 11/13/2020 1547   PROT 7.6 11/13/2020 1547   PROT 7.2 12/20/2019 1558   ALBUMIN 4.0 11/13/2020 1547    ALBUMIN 4.1 12/20/2019 1558   AST 14 (L) 11/13/2020 1547   ALT 9 11/13/2020 1547   ALKPHOS 86 11/13/2020 1547   BILITOT 0.5 11/13/2020 1547   BILITOT 0.3 12/20/2019 1558   GFRNONAA >60 11/13/2020 1547   GFRAA 92 12/20/2019 1558   Lab Results  Component Value Date   CHOL  06/01/2009    163        ATP III CLASSIFICATION:  <200     mg/dL   Desirable  200-239  mg/dL   Borderline High  >=240    mg/dL   High          HDL 31 (L) 06/01/2009   LDLCALC (H) 06/01/2009    108        Total Cholesterol/HDL:CHD Risk Coronary Heart Disease Risk Table                     Men   Women  1/2 Average Risk   3.4   3.3  Average Risk       5.0  4.4  2 X Average Risk   9.6   7.1  3 X Average Risk  23.4   11.0        Use the calculated Patient Ratio above and the CHD Risk Table to determine the patient's CHD Risk.        ATP III CLASSIFICATION (LDL):  <100     mg/dL   Optimal  100-129  mg/dL   Near or Above                    Optimal  130-159  mg/dL   Borderline  160-189  mg/dL   High  >190     mg/dL   Very High   TRIG 118 06/01/2009   CHOLHDL 5.3 06/01/2009   No results found for: HGBA1C Lab Results  Component Value Date   VITAMINB12 1,457 (H) 08/01/2018        ASSESSMENT AND PLAN 70 y.o. year old female  has a past medical history of Chest pain, Collagen vascular disease ([place]), Complication of anesthesia, Depression, DJD (degenerative joint disease), FH: CVA (cerebrovascular accident), Fibromyalgia, GERD (gastroesophageal reflux disease), Hypothyroidism, Lupus ([place]), Migraines, Palpitations, Status post cholecystectomy, and Tobacco use. here with   No diagnosis found.   Carrey notes increased muscle spasms over the past 3-4 weeks. She has tried and failed tizanidine and methocarbamol. She continues Baclofen TID. We will add levetiracetam '500mg'$  BID. I will update labs today to rule out concerns of infection due to left flank pain and dysuria. She was hospitalized for similar  symptoms in 12/2018. Last MRI in 12/2018 concerning for new cervical lesions. Will order cervical MRI for eval. I have offered steroids but she declines and states she has had worsening symptoms with steroids in the past. We will reevaluate pending labs and MRI. She was encouraged to monitor symptoms closely and call with any concerns of worsening. She will increase water intake. She verbalizes understanding and agreement with this plan.   No orders of the defined types were placed in this encounter.    No orders of the defined types were placed in this encounter.     I spent 45 minutes with the patient. 50% of this time was spent counseling and educating patient on plan of care and medications.     Debbora Presto, FNP-C 12/11/2020, 2:08 PM [facility] [address] [place], [place] [address] [phone]

## 2020-12-19 ENCOUNTER — Encounter (HOSPITAL_COMMUNITY)
Admission: RE | Admit: 2020-12-19 | Discharge: 2020-12-19 | Disposition: A | Discharge status: Home / Self Care | Payer: Medicare HMO | Source: Ambulatory Visit | Attending: Neurology | Admitting: Neurology

## 2020-12-26 ENCOUNTER — Other Ambulatory Visit: Payer: Self-pay

## 2020-12-26 ENCOUNTER — Encounter (HOSPITAL_COMMUNITY)
Admission: RE | Admit: 2020-12-26 | Discharge: 2020-12-26 | Disposition: A | Discharge status: Home / Self Care | Payer: Medicare HMO | Source: Ambulatory Visit | Attending: Neurology | Admitting: Neurology

## 2020-12-26 DIAGNOSIS — G360 Neuromyelitis optica [Devic]: Secondary | ICD-10-CM | POA: Diagnosis not present

## 2020-12-26 MED ORDER — ECULIZUMAB 300 MG/30ML IV SOLN
1200.0000 mg | Freq: Once | INTRAVENOUS | Status: DC
  Filled 2020-12-26: qty 120

## 2020-12-26 MED ORDER — ECULIZUMAB 300 MG/30ML IV SOLN
1200.0000 mg | Freq: Once | INTRAVENOUS | Status: AC
  Administered 2020-12-26: 1200 mg via INTRAVENOUS
  Filled 2020-12-26: qty 120

## 2020-12-26 MED ORDER — SODIUM CHLORIDE 0.9 % IV SOLN: INTRAVENOUS | Status: DC

## 2021-01-09 ENCOUNTER — Encounter (HOSPITAL_COMMUNITY)
Admission: RE | Admit: 2021-01-09 | Discharge: 2021-01-09 | Disposition: A | Discharge status: Home / Self Care | Payer: Medicare HMO | Source: Ambulatory Visit | Attending: Neurology | Admitting: Neurology

## 2021-01-09 ENCOUNTER — Other Ambulatory Visit: Payer: Self-pay

## 2021-01-09 DIAGNOSIS — G360 Neuromyelitis optica [Devic]: Secondary | ICD-10-CM | POA: Diagnosis not present

## 2021-01-09 MED ORDER — SODIUM CHLORIDE 0.9 % IV SOLN
1200.0000 mg | Freq: Once | INTRAVENOUS | Status: AC
  Administered 2021-01-09: 1200 mg via INTRAVENOUS
  Filled 2021-01-09: qty 120

## 2021-01-09 MED ORDER — SODIUM CHLORIDE 0.9 % IV SOLN: Freq: Once | INTRAVENOUS | Status: AC

## 2021-01-09 MED ORDER — NON FORMULARY: 1200.0000 mg | Freq: Once | Status: DC

## 2021-01-17 DIAGNOSIS — Z79891 Long term (current) use of opiate analgesic: Secondary | ICD-10-CM | POA: Diagnosis not present

## 2021-01-17 DIAGNOSIS — R251 Tremor, unspecified: Secondary | ICD-10-CM | POA: Diagnosis not present

## 2021-01-17 DIAGNOSIS — M130 Polyarthritis, unspecified: Secondary | ICD-10-CM | POA: Diagnosis not present

## 2021-01-17 DIAGNOSIS — M5416 Radiculopathy, lumbar region: Secondary | ICD-10-CM | POA: Diagnosis not present

## 2021-01-17 DIAGNOSIS — M79605 Pain in left leg: Secondary | ICD-10-CM | POA: Diagnosis not present

## 2021-01-17 DIAGNOSIS — G43709 Chronic migraine without aura, not intractable, without status migrainosus: Secondary | ICD-10-CM | POA: Diagnosis not present

## 2021-01-17 DIAGNOSIS — R252 Cramp and spasm: Secondary | ICD-10-CM | POA: Diagnosis not present

## 2021-01-17 DIAGNOSIS — G43711 Chronic migraine without aura, intractable, with status migrainosus: Secondary | ICD-10-CM | POA: Diagnosis not present

## 2021-01-17 DIAGNOSIS — J309 Allergic rhinitis, unspecified: Secondary | ICD-10-CM | POA: Diagnosis not present

## 2021-01-17 DIAGNOSIS — M545 Low back pain, unspecified: Secondary | ICD-10-CM | POA: Diagnosis not present

## 2021-01-18 ENCOUNTER — Other Ambulatory Visit (HOSPITAL_COMMUNITY): Payer: Self-pay | Admitting: Neurology

## 2021-01-18 ENCOUNTER — Other Ambulatory Visit: Payer: Self-pay | Admitting: Neurology

## 2021-01-18 DIAGNOSIS — M5416 Radiculopathy, lumbar region: Secondary | ICD-10-CM

## 2021-01-23 ENCOUNTER — Encounter (HOSPITAL_COMMUNITY)
Admission: RE | Admit: 2021-01-23 | Discharge: 2021-01-23 | Disposition: A | Discharge status: Home / Self Care | Payer: Medicare HMO | Source: Ambulatory Visit | Attending: Neurology | Admitting: Neurology

## 2021-01-23 ENCOUNTER — Encounter (HOSPITAL_COMMUNITY): Admission: RE | Admit: 2021-01-23 | Payer: Medicare HMO | Source: Ambulatory Visit

## 2021-01-23 ENCOUNTER — Encounter (HOSPITAL_COMMUNITY): Payer: Self-pay

## 2021-01-23 ENCOUNTER — Other Ambulatory Visit: Payer: Self-pay

## 2021-01-23 DIAGNOSIS — G360 Neuromyelitis optica [Devic]: Secondary | ICD-10-CM | POA: Diagnosis not present

## 2021-01-23 MED ORDER — NON FORMULARY: 1200.0000 mg | Freq: Once | Status: DC

## 2021-01-23 MED ORDER — SODIUM CHLORIDE 0.9 % IV SOLN: Freq: Once | INTRAVENOUS | Status: AC

## 2021-01-23 MED ORDER — SODIUM CHLORIDE 0.9 % IV SOLN
1200.0000 mg | Freq: Once | INTRAVENOUS | Status: AC
  Administered 2021-01-23: 1200 mg via INTRAVENOUS
  Filled 2021-01-23: qty 120

## 2021-02-06 ENCOUNTER — Encounter (HOSPITAL_COMMUNITY)
Admission: RE | Admit: 2021-02-06 | Discharge: 2021-02-06 | Disposition: A | Discharge status: Home / Self Care | Payer: Medicare HMO | Source: Ambulatory Visit | Attending: Neurology | Admitting: Neurology

## 2021-02-06 DIAGNOSIS — G360 Neuromyelitis optica [Devic]: Secondary | ICD-10-CM | POA: Diagnosis not present

## 2021-02-06 MED ORDER — NON FORMULARY: 1200.0000 mg | Freq: Once | Status: DC

## 2021-02-06 MED ORDER — ECULIZUMAB 300 MG/30ML IV SOLN
1200.0000 mg | Freq: Once | INTRAVENOUS | Status: AC
  Administered 2021-02-06: 1200 mg via INTRAVENOUS
  Filled 2021-02-06: qty 120

## 2021-02-06 MED ORDER — SODIUM CHLORIDE 0.9 % IV SOLN: INTRAVENOUS | Status: DC

## 2021-02-12 ENCOUNTER — Ambulatory Visit (HOSPITAL_COMMUNITY)
Admission: RE | Admit: 2021-02-12 | Discharge: 2021-02-12 | Disposition: A | Discharge status: Home / Self Care | Payer: Medicare HMO | Source: Ambulatory Visit | Attending: Neurology | Admitting: Neurology

## 2021-02-12 ENCOUNTER — Other Ambulatory Visit: Payer: Self-pay

## 2021-02-12 DIAGNOSIS — M47816 Spondylosis without myelopathy or radiculopathy, lumbar region: Secondary | ICD-10-CM | POA: Diagnosis not present

## 2021-02-12 DIAGNOSIS — M5416 Radiculopathy, lumbar region: Secondary | ICD-10-CM | POA: Diagnosis not present

## 2021-02-12 DIAGNOSIS — M48061 Spinal stenosis, lumbar region without neurogenic claudication: Secondary | ICD-10-CM | POA: Diagnosis not present

## 2021-02-20 ENCOUNTER — Encounter (HOSPITAL_COMMUNITY): Payer: Self-pay

## 2021-02-20 ENCOUNTER — Encounter (HOSPITAL_COMMUNITY)
Admission: RE | Admit: 2021-02-20 | Discharge: 2021-02-20 | Disposition: A | Discharge status: Home / Self Care | Payer: Medicare HMO | Source: Ambulatory Visit | Attending: Neurology | Admitting: Neurology

## 2021-02-20 DIAGNOSIS — G360 Neuromyelitis optica [Devic]: Secondary | ICD-10-CM | POA: Diagnosis not present

## 2021-02-20 MED ORDER — SODIUM CHLORIDE 0.9 % IV SOLN
1200.0000 mg | Freq: Once | INTRAVENOUS | Status: AC
  Administered 2021-02-20: 1200 mg via INTRAVENOUS
  Filled 2021-02-20: qty 120

## 2021-02-20 MED ORDER — NON FORMULARY: 1200.0000 mg | Freq: Once | Status: DC

## 2021-02-20 MED ORDER — SODIUM CHLORIDE 0.9 % IV SOLN: INTRAVENOUS | Status: DC

## 2021-03-06 ENCOUNTER — Encounter (HOSPITAL_COMMUNITY): Admission: RE | Admit: 2021-03-06 | Payer: Medicare HMO | Source: Ambulatory Visit

## 2021-03-12 ENCOUNTER — Other Ambulatory Visit: Payer: Self-pay

## 2021-03-12 ENCOUNTER — Encounter (HOSPITAL_COMMUNITY)
Admission: RE | Admit: 2021-03-12 | Discharge: 2021-03-12 | Disposition: A | Discharge status: Home / Self Care | Payer: Medicare HMO | Source: Ambulatory Visit | Attending: Neurology | Admitting: Neurology

## 2021-03-12 DIAGNOSIS — G360 Neuromyelitis optica [Devic]: Secondary | ICD-10-CM | POA: Diagnosis not present

## 2021-03-12 MED ORDER — SODIUM CHLORIDE 0.9 % IV SOLN
Freq: Once | INTRAVENOUS | Status: AC
  Administered 2021-03-12: 250 mL via INTRAVENOUS

## 2021-03-12 MED ORDER — NON FORMULARY: 1200.0000 mg | Freq: Once | Status: DC

## 2021-03-12 MED ORDER — SODIUM CHLORIDE 0.9 % IV SOLN
1200.0000 mg | Freq: Once | INTRAVENOUS | Status: AC
  Administered 2021-03-12: 1200 mg via INTRAVENOUS
  Filled 2021-03-12: qty 120

## 2021-03-13 DIAGNOSIS — R69 Illness, unspecified: Secondary | ICD-10-CM | POA: Diagnosis not present

## 2021-03-13 DIAGNOSIS — Z2821 Immunization not carried out because of patient refusal: Secondary | ICD-10-CM | POA: Diagnosis not present

## 2021-03-13 DIAGNOSIS — F17210 Nicotine dependence, cigarettes, uncomplicated: Secondary | ICD-10-CM | POA: Diagnosis not present

## 2021-03-13 DIAGNOSIS — Z299 Encounter for prophylactic measures, unspecified: Secondary | ICD-10-CM | POA: Diagnosis not present

## 2021-03-13 DIAGNOSIS — H6990 Unspecified Eustachian tube disorder, unspecified ear: Secondary | ICD-10-CM | POA: Diagnosis not present

## 2021-03-13 DIAGNOSIS — H9201 Otalgia, right ear: Secondary | ICD-10-CM | POA: Diagnosis not present

## 2021-03-13 DIAGNOSIS — G360 Neuromyelitis optica [Devic]: Secondary | ICD-10-CM | POA: Diagnosis not present

## 2021-03-13 DIAGNOSIS — Z6821 Body mass index (BMI) 21.0-21.9, adult: Secondary | ICD-10-CM | POA: Diagnosis not present

## 2021-03-26 ENCOUNTER — Encounter (HOSPITAL_COMMUNITY): Admission: RE | Admit: 2021-03-26 | Payer: Medicare HMO | Source: Ambulatory Visit

## 2021-03-27 DIAGNOSIS — R69 Illness, unspecified: Secondary | ICD-10-CM | POA: Diagnosis not present

## 2021-03-27 DIAGNOSIS — F17210 Nicotine dependence, cigarettes, uncomplicated: Secondary | ICD-10-CM | POA: Diagnosis not present

## 2021-03-27 DIAGNOSIS — H9201 Otalgia, right ear: Secondary | ICD-10-CM | POA: Diagnosis not present

## 2021-03-27 DIAGNOSIS — Z299 Encounter for prophylactic measures, unspecified: Secondary | ICD-10-CM | POA: Diagnosis not present

## 2021-03-27 DIAGNOSIS — F1320 Sedative, hypnotic or anxiolytic dependence, uncomplicated: Secondary | ICD-10-CM | POA: Diagnosis not present

## 2021-04-02 ENCOUNTER — Ambulatory Visit: Payer: Medicare HMO | Admitting: Family Medicine

## 2021-04-04 ENCOUNTER — Encounter (HOSPITAL_COMMUNITY)
Admission: RE | Admit: 2021-04-04 | Discharge: 2021-04-04 | Disposition: A | Discharge status: Home / Self Care | Payer: Medicare HMO | Source: Ambulatory Visit | Attending: Neurology | Admitting: Neurology

## 2021-04-08 ENCOUNTER — Other Ambulatory Visit: Payer: Self-pay | Admitting: Internal Medicine

## 2021-04-08 DIAGNOSIS — Z1231 Encounter for screening mammogram for malignant neoplasm of breast: Secondary | ICD-10-CM

## 2021-04-16 ENCOUNTER — Telehealth: Payer: Self-pay | Admitting: Neurology

## 2021-04-16 NOTE — Telephone Encounter (Signed)
Called the pt and advised her that as long as she has not had a fever she would be fine.  Also we needed to get her apt moved up sooner than the march apt scheduled for insurance purposes. We scheduled for 05/08/20 at 3 pm

## 2021-04-16 NOTE — Telephone Encounter (Signed)
Pt called, feeling congested in chest and head. Want to know if can get my infusion tomorrow at 1 pm. Would like a call from the nurse.

## 2021-04-17 ENCOUNTER — Other Ambulatory Visit: Payer: Self-pay

## 2021-04-17 ENCOUNTER — Encounter (HOSPITAL_COMMUNITY)
Admission: RE | Admit: 2021-04-17 | Discharge: 2021-04-17 | Disposition: A | Discharge status: Home / Self Care | Payer: Medicare HMO | Source: Ambulatory Visit | Attending: Neurology | Admitting: Neurology

## 2021-04-17 DIAGNOSIS — G360 Neuromyelitis optica [Devic]: Secondary | ICD-10-CM | POA: Diagnosis not present

## 2021-04-17 MED ORDER — SODIUM CHLORIDE 0.9 % IV SOLN
1200.0000 mg | Freq: Once | INTRAVENOUS | Status: AC
  Administered 2021-04-17: 14:00:00 1200 mg via INTRAVENOUS
  Filled 2021-04-17: qty 120

## 2021-04-17 MED ORDER — NON FORMULARY: 1200.0000 mg | Freq: Once | Status: DC

## 2021-04-17 MED ORDER — SODIUM CHLORIDE 0.9 % IV SOLN: Freq: Once | INTRAVENOUS | Status: AC

## 2021-04-23 DIAGNOSIS — Z79891 Long term (current) use of opiate analgesic: Secondary | ICD-10-CM | POA: Diagnosis not present

## 2021-04-23 DIAGNOSIS — R252 Cramp and spasm: Secondary | ICD-10-CM | POA: Diagnosis not present

## 2021-04-23 DIAGNOSIS — G43711 Chronic migraine without aura, intractable, with status migrainosus: Secondary | ICD-10-CM | POA: Diagnosis not present

## 2021-04-23 DIAGNOSIS — G43709 Chronic migraine without aura, not intractable, without status migrainosus: Secondary | ICD-10-CM | POA: Diagnosis not present

## 2021-04-23 DIAGNOSIS — J309 Allergic rhinitis, unspecified: Secondary | ICD-10-CM | POA: Diagnosis not present

## 2021-04-23 DIAGNOSIS — M130 Polyarthritis, unspecified: Secondary | ICD-10-CM | POA: Diagnosis not present

## 2021-04-23 DIAGNOSIS — M5416 Radiculopathy, lumbar region: Secondary | ICD-10-CM | POA: Diagnosis not present

## 2021-04-23 DIAGNOSIS — M545 Low back pain, unspecified: Secondary | ICD-10-CM | POA: Diagnosis not present

## 2021-04-23 DIAGNOSIS — R251 Tremor, unspecified: Secondary | ICD-10-CM | POA: Diagnosis not present

## 2021-04-23 DIAGNOSIS — M79605 Pain in left leg: Secondary | ICD-10-CM | POA: Diagnosis not present

## 2021-05-01 ENCOUNTER — Encounter (HOSPITAL_COMMUNITY)
Admission: RE | Admit: 2021-05-01 | Discharge: 2021-05-01 | Disposition: A | Discharge status: Home / Self Care | Payer: Medicare HMO | Source: Ambulatory Visit | Attending: Neurology | Admitting: Neurology

## 2021-05-01 ENCOUNTER — Encounter (HOSPITAL_COMMUNITY): Payer: Self-pay

## 2021-05-01 ENCOUNTER — Encounter (HOSPITAL_COMMUNITY): Admission: RE | Admit: 2021-05-01 | Payer: Medicare HMO | Source: Ambulatory Visit

## 2021-05-01 ENCOUNTER — Other Ambulatory Visit: Payer: Self-pay

## 2021-05-01 DIAGNOSIS — G360 Neuromyelitis optica [Devic]: Secondary | ICD-10-CM | POA: Diagnosis not present

## 2021-05-01 MED ORDER — NON FORMULARY: 1200.0000 mg | Freq: Once | Status: DC

## 2021-05-01 MED ORDER — SODIUM CHLORIDE 0.9 % IV SOLN
1200.0000 mg | Freq: Once | INTRAVENOUS | Status: AC
  Administered 2021-05-01: 13:00:00 1200 mg via INTRAVENOUS
  Filled 2021-05-01: qty 120

## 2021-05-01 MED ORDER — SODIUM CHLORIDE 0.9 % IV SOLN: INTRAVENOUS | Status: DC

## 2021-05-08 ENCOUNTER — Ambulatory Visit: Payer: Medicare HMO | Admitting: Neurology

## 2021-05-15 ENCOUNTER — Encounter (HOSPITAL_COMMUNITY)
Admission: RE | Admit: 2021-05-15 | Discharge: 2021-05-15 | Disposition: A | Discharge status: Home / Self Care | Payer: Medicare HMO | Source: Ambulatory Visit | Attending: Neurology | Admitting: Neurology

## 2021-05-15 DIAGNOSIS — G360 Neuromyelitis optica [Devic]: Secondary | ICD-10-CM | POA: Insufficient documentation

## 2021-05-15 MED ORDER — SODIUM CHLORIDE 0.9 % IV SOLN
1200.0000 mg | Freq: Once | INTRAVENOUS | Status: AC
  Administered 2021-05-15: 1200 mg via INTRAVENOUS
  Filled 2021-05-15: qty 120

## 2021-05-15 MED ORDER — SODIUM CHLORIDE 0.9 % IV SOLN: Freq: Once | INTRAVENOUS | Status: AC

## 2021-05-15 MED ORDER — NON FORMULARY: 1200.0000 mg | Freq: Once | Status: DC

## 2021-05-16 ENCOUNTER — Encounter: Payer: Self-pay | Admitting: Neurology

## 2021-05-16 ENCOUNTER — Ambulatory Visit (INDEPENDENT_AMBULATORY_CARE_PROVIDER_SITE_OTHER): Payer: Medicare HMO | Admitting: Neurology

## 2021-05-16 VITALS — BP 108/66 | HR 69 | Ht 60.0 in | Wt 126.5 lb

## 2021-05-16 DIAGNOSIS — N319 Neuromuscular dysfunction of bladder, unspecified: Secondary | ICD-10-CM | POA: Diagnosis not present

## 2021-05-16 DIAGNOSIS — G360 Neuromyelitis optica [Devic]: Secondary | ICD-10-CM | POA: Diagnosis not present

## 2021-05-16 DIAGNOSIS — Z79899 Other long term (current) drug therapy: Secondary | ICD-10-CM

## 2021-05-16 DIAGNOSIS — G8194 Hemiplegia, unspecified affecting left nondominant side: Secondary | ICD-10-CM

## 2021-05-16 NOTE — Progress Notes (Signed)
[facility]  PATIENT: Susan Hodge DOB: 1950-09-25  REFERRING DOCTOR OR PCP:  [name], NP SOURCE: Patient, daughter, notes from hospital and rehab admissions, multiple imaging and lab reports, MRI images of brain, cervical, thoracic and lumbar spine were reviewed  _________________________________   HISTORICAL  CHIEF COMPLAINT:  Chief Complaint  Patient presents with   Follow-up    Rm 2, w friend [place]. 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 Pt c/o of R ear pn. Was checked by Dr. Manuella Ghazi and cleared of any findings. Pt believes to have shingles.    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  Infusions are done in her home..   She tolerates it well.  She has not had any relapses since starting.  This compares to several relapses in rapid succession earlier in 2020.  She goes 200 feet and sometimes more with a walker.   This is improved compared to last year.  She has weakness and spasticity, left > right.     She notes some shaking in the left side of her body at times.  She has left leg tingling.    Spasms also involve the trunk.     She takes baclofen with benefit 5 mg tid.    Vision is fine.    She has urinary urgency, helped by Enablex.   She also has back pain leg pain.   An ESI has not helped and actually made her feel worse.   She has had issues tolerating steroids.       She has chronic migraines and takes [employer].  This is helping with less frequency and intensity.   She has had more this month than typical - a couple times a week. She takes Fioricet as needed.   She is on Valproic acid for the migraine and h/o several seizures about 7-8 year ago.     She had shingles and has itching in her right forehead and near her ear.   The distribution was V1 and she had corneal issues causing reduced vision (now better).     She is on Lyrica 50 mg po tid.     Neuromyelitis optica history: She began to experience clumsiness in  mid March 2020.  She had been placed on topiramate for migraines and had recently and had some dizziness and vertigo initially.  She fell and a day later began to have urinary incontinence and inability to move the left leg.   She went to the ED at [facility] and an MRI of the lumbar spine shoowed degenerative disc changes.    She was prescribed a steroid pack and discharged.   The next day she was worse and her PCP sent her to the [facility] ED.   She had MRI of the brain and cervical and thoracic spine.   The MRI of the brain showed a remote right MCA stroke (she never had symptoms).   MRI of the spine showed multifocal lesions within the spinal cord, centrally but asymmetric.      She received 5 days of IV Solu-Medrol in the hospital.   She initially improved after the steroids and was using a walker well a couple weeks later at discharge from rehab.  Balance was still poor but strength was good.   Of note she had no arm weakness in March or April.   Over the past couple days, she has felt much weaker with more left leg > arm  weakness.  .  She also has had right flank numbness and a dysesthetic sensation (pressure like pain) since yesterday.      Bladder impoved with steroids and is continuing to do well (she is on Enablex).    She had the meningitis vaccination (both the [place] and the B shots) and started [facility] June 2020.    MRIs The MRI of the brain 07/31/2018 shows a remote right MCA infarction with a small amount of associated hemosiderin.  There are also some scattered T2/flair hyperintense foci consistent with mild chronic microvascular ischemic changes.  There were no acute findings.  The MRI of the cervical spine shows T2 hyperintense signal within the spinal cord posteriorly to the right adjacent to C2, posterior to the left adjacent to C3, and a little more to the right but central adjacent to C7.  The foci adjacent to C3 and C7 does have subtle enhancement after gadolinium was infused.  There  are some degenerative changes at C4-C5, C5-C6 and C6-C7 but no definite nerve root compression and no spinal stenosis.   MRI of the thoracic spine shows diffuse patchy T2 hyperintensity that is predominantly central.   Number spine shows the abnormal signal within the spinal cord adjacent to T10 and T11.  There are degenerative changes at L3-L4, L4-L5 and L5-S1.  There is spinal stenosis at L4-L5.  With possibility for left L4 and L5 nerve root  Labs: Neuromyelitis optica IgG antibody was positive at 139 (< 3 normal) at diagnosis.  Lumbar puncture showed a small elevation of white blood cell count, predominantly lymphocytes and macrophages.  B12, copper were normal.  Herpes simplex virus and HIV were negative.  ACE was normal.  REVIEW OF SYSTEMS: Constitutional: No fevers, chills, sweats, or change in appetite Eyes: No visual changes, double vision, eye pain Ear, nose and throat: No hearing loss, ear pain, nasal congestion, sore throat Cardiovascular: No chest pain, palpitations Respiratory:  No shortness of breath at rest or with exertion.   No wheezes GastrointestinaI: No nausea, vomiting, diarrhea, abdominal pain, fecal incontinence Genitourinary: As above Musculoskeletal:  No neck pain, back pain Integumentary: No rash, pruritus, skin lesions Neurological: as above Psychiatric: No depression at this time.  No anxiety Endocrine: No palpitations, diaphoresis, change in appetite, change in weigh or increased thirst Hematologic/Lymphatic:  No anemia, purpura, petechiae. Allergic/Immunologic: No itchy/runny eyes, nasal congestion, recent allergic reactions, rashes  ALLERGIES: Allergies  Allergen Reactions   Prednisone Other (See Comments)    Not to be given at any point while patient is still taking Soliris infusions.  Severe shaking and dehydration.   Amitriptyline Other (See Comments)   Bee Venom Other (See Comments)   Cymbalta [Duloxetine Hcl] Nausea Only    Constipation     Fexofenadine Other (See Comments)   Furosemide Other (See Comments)   Gabapentin Other (See Comments)   Hydroxychloroquine Sulfate Other (See Comments)   Morphine Other (See Comments)   Nsaids    Oxycodone Other (See Comments)    dizzy   Topiramate Other (See Comments)    HOME MEDICATIONS:  Current Outpatient Medications:    acetaminophen (TYLENOL) 325 MG tablet, Take 2 tablets (650 mg total) by mouth every 6 (six) hours as needed for mild pain (or Fever >/= 101)., Disp: , Rfl:    ALPRAZolam (XANAX) 0.5 MG tablet, Take 1 tablet (0.5 mg total) by mouth 3 (three) times daily as needed for anxiety or sleep., Disp: 10 tablet, Rfl: 0   amoxicillin (AMOXIL) 250  MG capsule, Take 250 mg by mouth 3 (three) times daily as needed (for ear infection/inflammation). , Disp: , Rfl:    baclofen (LIORESAL) 10 MG tablet, 1/2 to one pill up to 5 a day, Disp: 150 tablet, Rfl: 11   Butalbital-APAP-Caffeine (FIORICET PO), Take by mouth., Disp: , Rfl:    Cyanocobalamin (VITAMIN B-12 PO), Take 2 tablets by mouth daily., Disp: , Rfl:    darifenacin (ENABLEX) 15 MG 24 hr tablet, TAKE 1 TABLET BY MOUTH DAILY, Disp: 30 tablet, Rfl: 5   DULoxetine (CYMBALTA) 20 MG capsule, duloxetine 20 mg capsule,delayed release  Take 1 capsule every day by oral route., Disp: , Rfl:    Eculizumab (SOLIRIS IV), Inject 1,200 mg into the vein every 14 (fourteen) days. , Disp: , Rfl:    Galcanezumab-gnlm (EMGALITY) 120 MG/ML SOAJ, Inject 120 mg into the skin every 30 (thirty) days., Disp: , Rfl:    lidocaine (LIDODERM) 5 %, Place 1 patch onto the skin every 12 (twelve) hours., Disp: , Rfl:    loratadine (CLARITIN) 10 MG tablet, Take 10 mg by mouth daily., Disp: , Rfl:    ondansetron (ZOFRAN) 4 MG tablet, Take 1 tablet (4 mg total) by mouth every 6 (six) hours as needed for nausea., Disp: 20 tablet, Rfl: 0   oxyCODONE-acetaminophen (PERCOCET) 10-325 MG tablet, Take 1 tablet by mouth every 6 (six) hours as needed., Disp: 10 tablet, Rfl:  0   pantoprazole (PROTONIX) 40 MG tablet, Take 1 tablet (40 mg total) by mouth 2 (two) times daily before a meal., Disp: 60 tablet, Rfl: 1   polyethylene glycol (MIRALAX) 17 g packet, Take 17 g by mouth every morning., Disp: 30 each, Rfl: 3   potassium chloride SA (KLOR-CON) 20 MEQ tablet, Take 20 mEq by mouth 2 (two) times daily., Disp: , Rfl:    pregabalin (LYRICA) 50 MG capsule, Take 50 mg by mouth 3 (three) times daily., Disp: , Rfl:    UNABLE TO FIND, Med Name: CBD OIL that she uses topically for pain., Disp: , Rfl:    UNABLE TO FIND, daily. Med Name: fiber gummies, Disp: , Rfl:    valproic acid (DEPAKENE) 250 MG capsule, Take 1 capsule (250 mg total) by mouth at bedtime., Disp: 30 capsule, Rfl: 1   Vitamin D, Ergocalciferol, (DRISDOL) 1.25 MG (50000 UT) CAPS capsule, Take 1 capsule (50,000 Units total) by mouth every Monday., Disp: 4 capsule, Rfl: 0   vitamin E 1000 UNIT capsule, Take 1,000 Units by mouth daily., Disp: , Rfl:   PAST MEDICAL HISTORY: Past Medical History:  Diagnosis Date   Chest pain    Collagen vascular disease ([place])    Complication of anesthesia    Blisters to mouth.   Depression    DJD (degenerative joint disease)    FH: CVA (cerebrovascular accident)    father   Fibromyalgia    GERD (gastroesophageal reflux disease)    Hypothyroidism    Lupus (HCC)    Migraines    Palpitations    Status post cholecystectomy    appendectomy and bilaateral oophorectomy   Tobacco use     PAST SURGICAL HISTORY: Past Surgical History:  Procedure Laterality Date   BILATERAL OOPHORECTOMY     CARDIAC CATHETERIZATION     CHOLECYSTECTOMY     COLONOSCOPY WITH PROPOFOL N/A 01/03/2019   Procedure: COLONOSCOPY WITH PROPOFOL;  Surgeon: Rogene Houston, MD;  [facility];  Service: Endoscopy;  Laterality: N/A;   ESOPHAGOGASTRODUODENOSCOPY (EGD) WITH PROPOFOL N/A  01/02/2019   Procedure: ESOPHAGOGASTRODUODENOSCOPY (EGD) WITH PROPOFOL;  Surgeon: Rogene Houston, MD;   Location: [facility];  Service: Endoscopy;  Laterality: N/A;   POLYPECTOMY  01/03/2019   Procedure: POLYPECTOMY;  Surgeon: Rogene Houston, MD;  [facility]: [facility];  Service: Endoscopy;;    FAMILY HISTORY: Family History  Problem Relation Age of Onset   Stroke Father     SOCIAL HISTORY:  Social History   Socioeconomic History   Marital status: Single    Spouse name: Not on file   Number of children: 3   Years of education: Not on file   Highest education level: Not on file  Occupational History   Occupation: disability  Tobacco Use   Smoking status: Every Day    Packs/day: 0.50    Types: Cigarettes   Smokeless tobacco: Never   Tobacco comments:    quit last week 01/03/19  Vaping Use   Vaping Use: Never used  Substance and Sexual Activity   Alcohol use: No   Drug use: Never   Sexual activity: Not on file  Other Topics Concern   Not on file  Social History Narrative   Friend lives with her   Caffeine use: coffee daily   Left handed    Social Determinants of Health   Financial Resource Strain: Not on file  Food Insecurity: Not on file  Transportation Needs: Not on file  Physical Activity: Not on file  Stress: Not on file  Social Connections: Not on file  Intimate Partner Violence: Not on file     PHYSICAL EXAM  Vitals:   05/16/21 1552  BP: 108/66  Pulse: 69  SpO2: 96%  Weight: 126 lb 8 oz (57.4 kg)  Height: 5' (1.524 m)    Body mass index is 24.71 kg/m.   General: The patient is well-developed and well-nourished and in no acute distress   Skin: Extremities are without rash or edema.   Neurologic Exam  Mental status: The patient is alert and oriented x 3 at the time of the examination. The patient has apparent normal recent and remote memory, with an apparently normal attention span and concentration ability.   Speech is normal.  Cranial nerves: Extraocular movements are full.   Facial symmetry is present. .Facial strength is normal.   Trapezius and sternocleidomastoid strength is normal. No dysarthria is noted.  No obvious hearing deficits are noted.  Motor:  Muscle bulk is normal.   Mildly increased muscle tone in the left leg.  Strength was 4 -/5 in the iliopsoas muscle and 4/5 elsewhere in left leg.  Strength is 4+/5 in the right leg.  Strength was 5/5 in the right arm strength is 5/5 in arms   Sensory: She now reports normal sensation to touch and temperature as well as vibration sensation. Coordination: Cerebellar testing reveals good right finger-nose-finger but she is unable to do left finger-nose-finger.  Reduced heel-to-shin, worse on the left.  Gait and station: Stands up using arms to help.  She is able to walk within the room with unilateral support.  She has a positive Romberg   Reflexes: Deep tendon reflexes are increased in the left leg relative to the right with spread at knees.  No clonus.    DIAGNOSTIC DATA (LABS, IMAGING, TESTING) - I reviewed patient records, labs, notes, testing and imaging myself where available.  Lab Results  Component Value Date   WBC 5.0 11/13/2020   HGB 13.7 11/13/2020   HCT 41.3 11/13/2020  MCV 92.4 11/13/2020   PLT 151 11/13/2020      Component Value Date/Time   NA 135 11/13/2020 1547   NA 139 12/20/2019 1558   K 3.9 11/13/2020 1547   CL 101 11/13/2020 1547   CO2 28 11/13/2020 1547   GLUCOSE 90 11/13/2020 1547   BUN 12 11/13/2020 1547   BUN 8 12/20/2019 1558   CREATININE 0.85 11/13/2020 1547   CALCIUM 8.9 11/13/2020 1547   PROT 7.6 11/13/2020 1547   PROT 7.2 12/20/2019 1558   ALBUMIN 4.0 11/13/2020 1547   ALBUMIN 4.1 12/20/2019 1558   AST 14 (L) 11/13/2020 1547   ALT 9 11/13/2020 1547   ALKPHOS 86 11/13/2020 1547   BILITOT 0.5 11/13/2020 1547   BILITOT 0.3 12/20/2019 1558   GFRNONAA >60 11/13/2020 1547   GFRAA 92 12/20/2019 1558    No results found for: HGBA1C Lab Results  Component Value Date   VITAMINB12 1,457 (H) 08/01/2018    _____________________  Neuromyelitis optica (devic) ([place]) - Plan: CBC with Differential/Platelet, Comprehensive metabolic panel, Neuromyelitis optica autoab, IgG  High risk medication use - Plan: CBC with Differential/Platelet, Comprehensive metabolic panel, Neuromyelitis optica autoab, IgG  Left hemiplegia (HCC)  Neurogenic bladder  1.   Continue Soliris for neuromyelitis optica.  Check labs today.  We discussed getting a meningitis booster.  We will try to arrange this through [facility].  Check lab work today.  3.  She has moderately severe spinal stenosis at L4-L5, moderate spinal stenosis at L3-L4 mild spinal stenosis at L5-S1. If symptoms worsen again I would recommend that she see neurosurgery. 3.   Continue baclofen for spasticity. 4.   She will continue Lyrica for her postherpetic neuralgia (V1 distribution)  5. she will return to see me in 6 months or sooner if there are new or worsening neurologic symptoms.     Ignace Mandigo A. Felecia Shelling, MD, [facility] 0/88/1103, 1:59 PM Certified in Neurology, Clinical Neurophysiology, Sleep Medicine, Pain Medicine and Neuroimaging  [facility] [address] [place], [place] [address] [phone]

## 2021-05-22 LAB — CBC WITH DIFFERENTIAL/PLATELET
Basophils Absolute: 0 10*3/uL (ref 0.0–0.2)
Basos: 1 %
EOS (ABSOLUTE): 0.1 10*3/uL (ref 0.0–0.4)
Eos: 1 %
Hematocrit: 38.4 % (ref 34.0–46.6)
Hemoglobin: 13.5 g/dL (ref 11.1–15.9)
Immature Grans (Abs): 0 10*3/uL (ref 0.0–0.1)
Immature Granulocytes: 0 %
Lymphocytes Absolute: 1.2 10*3/uL (ref 0.7–3.1)
Lymphs: 21 %
MCH: 31.5 pg (ref 26.6–33.0)
MCHC: 35.2 g/dL (ref 31.5–35.7)
MCV: 90 fL (ref 79–97)
Monocytes Absolute: 0.4 10*3/uL (ref 0.1–0.9)
Monocytes: 8 %
Neutrophils Absolute: 4 10*3/uL (ref 1.4–7.0)
Neutrophils: 69 %
Platelets: 146 10*3/uL — ABNORMAL LOW (ref 150–450)
RBC: 4.29 x10E6/uL (ref 3.77–5.28)
RDW: 12.9 % (ref 11.7–15.4)
WBC: 5.8 10*3/uL (ref 3.4–10.8)

## 2021-05-22 LAB — COMPREHENSIVE METABOLIC PANEL
ALT: 6 IU/L (ref 0–32)
AST: 14 IU/L (ref 0–40)
Albumin/Globulin Ratio: 1.3 (ref 1.2–2.2)
Albumin: 4.1 g/dL (ref 3.8–4.8)
Alkaline Phosphatase: 87 IU/L (ref 44–121)
BUN/Creatinine Ratio: 10 — ABNORMAL LOW (ref 12–28)
BUN: 8 mg/dL (ref 8–27)
Bilirubin Total: 0.3 mg/dL (ref 0.0–1.2)
CO2: 24 mmol/L (ref 20–29)
Calcium: 9.4 mg/dL (ref 8.7–10.3)
Chloride: 100 mmol/L (ref 96–106)
Creatinine, Ser: 0.83 mg/dL (ref 0.57–1.00)
Globulin, Total: 3.2 g/dL (ref 1.5–4.5)
Glucose: 89 mg/dL (ref 70–99)
Potassium: 4.3 mmol/L (ref 3.5–5.2)
Sodium: 138 mmol/L (ref 134–144)
Total Protein: 7.3 g/dL (ref 6.0–8.5)
eGFR: 76 mL/min/{1.73_m2} (ref >59)

## 2021-05-22 LAB — NEUROMYELITIS OPTICA AUTOAB, IGG: NMO IgG Autoantibodies: 245.6 U/mL — ABNORMAL HIGH (ref 0.0–3.0)

## 2021-05-27 ENCOUNTER — Ambulatory Visit: Payer: Medicare HMO | Admitting: Neurology

## 2021-05-27 ENCOUNTER — Telehealth: Payer: Self-pay | Admitting: Neurology

## 2021-05-27 DIAGNOSIS — R69 Illness, unspecified: Secondary | ICD-10-CM | POA: Diagnosis not present

## 2021-05-27 DIAGNOSIS — L0390 Cellulitis, unspecified: Secondary | ICD-10-CM | POA: Diagnosis not present

## 2021-05-27 DIAGNOSIS — Z299 Encounter for prophylactic measures, unspecified: Secondary | ICD-10-CM | POA: Diagnosis not present

## 2021-05-27 DIAGNOSIS — Z713 Dietary counseling and surveillance: Secondary | ICD-10-CM | POA: Diagnosis not present

## 2021-05-27 DIAGNOSIS — F17210 Nicotine dependence, cigarettes, uncomplicated: Secondary | ICD-10-CM | POA: Diagnosis not present

## 2021-05-27 DIAGNOSIS — Z6821 Body mass index (BMI) 21.0-21.9, adult: Secondary | ICD-10-CM | POA: Diagnosis not present

## 2021-05-27 NOTE — Telephone Encounter (Signed)
Called pt back. She sees Dr. Manuella Ghazi today at 3:45pm. Advised per Dr. Felecia Shelling that she is ok to take antibiotic. Ok to proceed w/ Soliris infusion Wednesday as long as she is fever free. She verbalized understanding and appreciation.

## 2021-05-27 NOTE — Telephone Encounter (Signed)
Pt has got a spider bite. Being seen by PCP this afternoon at 3:45p. Getting my infusion on Wednesday, want to know if there would be an problem with antibiotic. Would like a call from the nurse.

## 2021-05-29 ENCOUNTER — Encounter (HOSPITAL_COMMUNITY): Payer: Self-pay

## 2021-05-29 ENCOUNTER — Encounter (HOSPITAL_COMMUNITY)
Admission: RE | Admit: 2021-05-29 | Discharge: 2021-05-29 | Disposition: A | Discharge status: Home / Self Care | Payer: Medicare HMO | Source: Ambulatory Visit | Attending: Neurology | Admitting: Neurology

## 2021-05-29 DIAGNOSIS — G360 Neuromyelitis optica [Devic]: Secondary | ICD-10-CM | POA: Diagnosis not present

## 2021-05-29 MED ORDER — SODIUM CHLORIDE 0.9 % IV SOLN: Freq: Once | INTRAVENOUS | Status: AC

## 2021-05-29 MED ORDER — SODIUM CHLORIDE 0.9 % IV SOLN
1200.0000 mg | Freq: Once | INTRAVENOUS | Status: AC
  Administered 2021-05-29: 1200 mg via INTRAVENOUS
  Filled 2021-05-29: qty 120

## 2021-05-29 MED ORDER — NON FORMULARY: 1200.0000 mg | Freq: Once | Status: DC

## 2021-06-06 ENCOUNTER — Telehealth: Payer: Self-pay | Admitting: Neurology

## 2021-06-06 NOTE — Telephone Encounter (Signed)
Received a call from [facility] that her authorization was nearing the end and would need to get re auth for her medication. She has her next infusion on 06/12/21 and current Josem Kaufmann is good until that time. Advised I would call and start an auth and then fax new auth and order to [facility] [phone].  Called [employer] and spoke with someone who transferred me to the pharmacy dept to initiate the PA. I spoke with Joy who states that they can no longer do this over the phone and offered to fax the form. I provided our fax number. I did confirm that this is the correct form to be completed as previously this was done under medical buy and bill and not pharmacy. She indicated that on the form there is a spot to check medical and can put the name of the facility in that section. Once completed we are to submit to [phone].

## 2021-06-07 NOTE — Telephone Encounter (Signed)
Received a letter for Susan Hodge stating there was approved coverage for the pt from 06/13/21-12/11/21. I will send this information to [facility] for their records.

## 2021-06-12 ENCOUNTER — Encounter (HOSPITAL_COMMUNITY)
Admission: RE | Admit: 2021-06-12 | Discharge: 2021-06-12 | Disposition: A | Discharge status: Home / Self Care | Payer: Medicare HMO | Source: Ambulatory Visit | Attending: Neurology | Admitting: Neurology

## 2021-06-12 ENCOUNTER — Encounter (HOSPITAL_COMMUNITY): Payer: Self-pay

## 2021-06-12 DIAGNOSIS — G360 Neuromyelitis optica [Devic]: Secondary | ICD-10-CM | POA: Insufficient documentation

## 2021-06-12 MED ORDER — NON FORMULARY: 1200.0000 mg | Freq: Once | Status: DC

## 2021-06-12 MED ORDER — SODIUM CHLORIDE 0.9 % IV SOLN
1200.0000 mg | Freq: Once | INTRAVENOUS | Status: AC
  Administered 2021-06-12: 1200 mg via INTRAVENOUS
  Filled 2021-06-12: qty 120

## 2021-06-12 MED ORDER — SODIUM CHLORIDE 0.9 % IV SOLN: Freq: Once | INTRAVENOUS | Status: AC

## 2021-06-14 DIAGNOSIS — M329 Systemic lupus erythematosus, unspecified: Secondary | ICD-10-CM | POA: Diagnosis not present

## 2021-06-14 DIAGNOSIS — Z299 Encounter for prophylactic measures, unspecified: Secondary | ICD-10-CM | POA: Diagnosis not present

## 2021-06-14 DIAGNOSIS — F17210 Nicotine dependence, cigarettes, uncomplicated: Secondary | ICD-10-CM | POA: Diagnosis not present

## 2021-06-14 DIAGNOSIS — F1320 Sedative, hypnotic or anxiolytic dependence, uncomplicated: Secondary | ICD-10-CM | POA: Diagnosis not present

## 2021-06-14 DIAGNOSIS — R110 Nausea: Secondary | ICD-10-CM | POA: Diagnosis not present

## 2021-06-14 DIAGNOSIS — R69 Illness, unspecified: Secondary | ICD-10-CM | POA: Diagnosis not present

## 2021-06-26 ENCOUNTER — Encounter (HOSPITAL_COMMUNITY): Payer: Self-pay

## 2021-06-26 ENCOUNTER — Encounter (HOSPITAL_COMMUNITY)
Admission: RE | Admit: 2021-06-26 | Discharge: 2021-06-26 | Disposition: A | Discharge status: Home / Self Care | Payer: Medicare HMO | Source: Ambulatory Visit | Attending: Neurology | Admitting: Neurology

## 2021-06-26 DIAGNOSIS — G360 Neuromyelitis optica [Devic]: Secondary | ICD-10-CM | POA: Diagnosis not present

## 2021-06-26 MED ORDER — SODIUM CHLORIDE 0.9 % IV SOLN: Freq: Once | INTRAVENOUS | Status: AC

## 2021-06-26 MED ORDER — SODIUM CHLORIDE 0.9 % IV SOLN
1200.0000 mg | Freq: Once | INTRAVENOUS | Status: AC
  Administered 2021-06-26: 1200 mg via INTRAVENOUS
  Filled 2021-06-26: qty 120

## 2021-06-26 MED ORDER — NON FORMULARY: 1200.0000 mg | Freq: Once | Status: DC

## 2021-07-10 ENCOUNTER — Encounter (HOSPITAL_COMMUNITY)
Admission: RE | Admit: 2021-07-10 | Discharge: 2021-07-10 | Disposition: A | Discharge status: Home / Self Care | Payer: Medicare HMO | Source: Ambulatory Visit | Attending: Neurology | Admitting: Neurology

## 2021-07-10 ENCOUNTER — Telehealth: Payer: Self-pay | Admitting: Neurology

## 2021-07-10 DIAGNOSIS — G360 Neuromyelitis optica [Devic]: Secondary | ICD-10-CM | POA: Insufficient documentation

## 2021-07-10 MED ORDER — SODIUM CHLORIDE 0.9 % IV SOLN: INTRAVENOUS | Status: DC

## 2021-07-10 MED ORDER — NON FORMULARY: 1200.0000 mg | Freq: Once | Status: DC

## 2021-07-10 MED ORDER — SODIUM CHLORIDE 0.9 % IV SOLN
1200.0000 mg | Freq: Once | INTRAVENOUS | Status: AC
  Administered 2021-07-10: 1200 mg via INTRAVENOUS
  Filled 2021-07-10: qty 120

## 2021-07-10 NOTE — Telephone Encounter (Signed)
Called the patient back. There was no answer. Left a message advising there is no contraindication to have her eyes dilated in regards to her medication.  ?Asked that she call back tomorrow to provide more information about the  infusion site and why they are moving. Once of the reasons she was going there was due to not having a ride to get here. Wanting to ensure that is no longer a concern. Will await to hear back from the pt. ?

## 2021-07-10 NOTE — Telephone Encounter (Signed)
Pt asking if getting eye dilated at the eye doctor. Is that going to have an effect on Eculizumab (SOLIRIS IV). ? ?Susan Hodge transferring to a clinic in [place]. Want to know if can transfer back to [place] for infusions. ? ?Would like a call from the nurse. ?

## 2021-07-12 DIAGNOSIS — Z6821 Body mass index (BMI) 21.0-21.9, adult: Secondary | ICD-10-CM | POA: Diagnosis not present

## 2021-07-12 DIAGNOSIS — Z1339 Encounter for screening examination for other mental health and behavioral disorders: Secondary | ICD-10-CM | POA: Diagnosis not present

## 2021-07-12 DIAGNOSIS — Z0000 Encounter for general adult medical examination without abnormal findings: Secondary | ICD-10-CM | POA: Diagnosis not present

## 2021-07-12 DIAGNOSIS — Z7189 Other specified counseling: Secondary | ICD-10-CM | POA: Diagnosis not present

## 2021-07-12 DIAGNOSIS — Z1331 Encounter for screening for depression: Secondary | ICD-10-CM | POA: Diagnosis not present

## 2021-07-12 DIAGNOSIS — E559 Vitamin D deficiency, unspecified: Secondary | ICD-10-CM | POA: Diagnosis not present

## 2021-07-12 DIAGNOSIS — G8194 Hemiplegia, unspecified affecting left nondominant side: Secondary | ICD-10-CM | POA: Diagnosis not present

## 2021-07-12 DIAGNOSIS — R5383 Other fatigue: Secondary | ICD-10-CM | POA: Diagnosis not present

## 2021-07-12 DIAGNOSIS — Z299 Encounter for prophylactic measures, unspecified: Secondary | ICD-10-CM | POA: Diagnosis not present

## 2021-07-12 DIAGNOSIS — E7800 Pure hypercholesterolemia, unspecified: Secondary | ICD-10-CM | POA: Diagnosis not present

## 2021-07-12 DIAGNOSIS — Z79899 Other long term (current) drug therapy: Secondary | ICD-10-CM | POA: Diagnosis not present

## 2021-07-16 DIAGNOSIS — M130 Polyarthritis, unspecified: Secondary | ICD-10-CM | POA: Diagnosis not present

## 2021-07-16 DIAGNOSIS — Z79891 Long term (current) use of opiate analgesic: Secondary | ICD-10-CM | POA: Diagnosis not present

## 2021-07-16 DIAGNOSIS — M545 Low back pain, unspecified: Secondary | ICD-10-CM | POA: Diagnosis not present

## 2021-07-16 DIAGNOSIS — G43709 Chronic migraine without aura, not intractable, without status migrainosus: Secondary | ICD-10-CM | POA: Diagnosis not present

## 2021-07-16 DIAGNOSIS — R251 Tremor, unspecified: Secondary | ICD-10-CM | POA: Diagnosis not present

## 2021-07-16 DIAGNOSIS — B029 Zoster without complications: Secondary | ICD-10-CM | POA: Diagnosis not present

## 2021-07-22 ENCOUNTER — Telehealth: Payer: Self-pay | Admitting: Neurology

## 2021-07-22 NOTE — Telephone Encounter (Signed)
LVM returning pt call. Asked her to call back. ?

## 2021-07-22 NOTE — Telephone Encounter (Signed)
Pt said, going tomorrow to get infusion at [facility] in [place]. After that [facility] in [place] transferring pt back to [place] for infusion.  ?

## 2021-07-22 NOTE — Telephone Encounter (Signed)
At 12:04 pt left a message returning the call re: the starting of her IV treatment. ?

## 2021-07-22 NOTE — Telephone Encounter (Signed)
Called the patient back. She is scheduled 07/24/2021 with [facility] but they are shutting down their location and she would like to come back to our location for her infusion. Advised we would get the process going on our end and someone will be in touch with her to get her scheduled once we get her insurance auth changed.  ?

## 2021-07-22 NOTE — Telephone Encounter (Signed)
Order form has been completed and will be given to [facility] along with previous authorization. Awaiting Dr Garth Bigness signature.  ?

## 2021-07-23 ENCOUNTER — Telehealth: Payer: Self-pay | Admitting: Neurology

## 2021-07-23 NOTE — Telephone Encounter (Signed)
At 4:28 yesterday Renee@ [facility] [employer] [facility] [employer] left a vm for [employer], Therapist, sports re: their regional account manager informing her that Vaccines  are needed for pt.  Renee stated they have a new order form that they can fax for this, Joseph Art can be reached at [phone] ?

## 2021-07-23 NOTE — Telephone Encounter (Signed)
LVM returning call.  ? ?Per Dr. Garth Bigness last [place] note from 05/16/21: " We discussed getting a meningitis booster.  We will try to arrange this through [facility]" ?

## 2021-07-23 NOTE — Telephone Encounter (Addendum)
Took call from phone staff and spoke with [facility]. She has been unable to reach pt. She has tried reaching out. ?I confirmed pt had the meningitis vaccination (both the [facility] and the B shots) and then started [facility] June 2020 per notes from Dr. Felecia Shelling.  ? ?She states she needs meningitis AC [place] q 5 yr after initial dose prior to starting treatment. She needs meningitis B shot q 2-3 years after initial dose prior to starting treatment. Advised I will try to reach pt to see if she has received B shot booster as discussed at last visit. If not, Su Hilt states pt can call her at [phone] to coordinate getting this completed.  ? ?I LVM for pt to call office.  ?

## 2021-07-23 NOTE — Telephone Encounter (Signed)
Took call from phone staff and spoke w/ pt. She has not gotten meningitis vaccine booster.  She has not received called from Renee/nurse to schedule this. I relayed Joseph Art has been trying to reach her. I provided her phone# below for her to call and get this set up. Once she receives, she will provide proof of vaccination to our office. ? ?She also mentioned she has infusion tomorrow at [facility]. They told her they might be able to do one more infusion after tomorrows. I asked she let us know if this is the case so we do not try and transition her over to our location on Thursday if she can get one more infusion there. She confirmed she will let us know if this is the case. ?

## 2021-07-23 NOTE — Telephone Encounter (Signed)
LVM for Susan Hodge asking for call back to discuss. ?

## 2021-07-24 ENCOUNTER — Ambulatory Visit: Payer: Medicare HMO | Admitting: Family Medicine

## 2021-07-24 ENCOUNTER — Encounter (HOSPITAL_COMMUNITY): Payer: Self-pay

## 2021-07-24 ENCOUNTER — Encounter (HOSPITAL_COMMUNITY)
Admission: RE | Admit: 2021-07-24 | Discharge: 2021-07-24 | Disposition: A | Discharge status: Home / Self Care | Payer: Medicare HMO | Source: Ambulatory Visit | Attending: Neurology | Admitting: Neurology

## 2021-07-24 DIAGNOSIS — G360 Neuromyelitis optica [Devic]: Secondary | ICD-10-CM | POA: Diagnosis not present

## 2021-07-24 MED ORDER — SODIUM CHLORIDE 0.9 % IV SOLN: INTRAVENOUS | Status: DC

## 2021-07-24 MED ORDER — NON FORMULARY: 1200.0000 mg | Freq: Once | Status: DC

## 2021-07-24 MED ORDER — SODIUM CHLORIDE 0.9 % IV SOLN
1200.0000 mg | Freq: Once | INTRAVENOUS | Status: AC
  Administered 2021-07-24: 1200 mg via INTRAVENOUS
  Filled 2021-07-24: qty 120

## 2021-07-25 NOTE — Telephone Encounter (Signed)
Infusion suite already spoke with her this afternoon. They submitted insurance auth and awaiting a decision for this ?

## 2021-07-25 NOTE — Telephone Encounter (Signed)
Pt has called back to relay that yes, go ahead with authorization for her Soliris  ?

## 2021-07-25 NOTE — Telephone Encounter (Signed)
LVM for pt to call office back before 5pm. Wondering if she wants [place] to go ahead with authorization for her Soliris or if she plans to get one more infusion at [facility].  ?

## 2021-07-29 ENCOUNTER — Other Ambulatory Visit: Payer: Self-pay | Admitting: Neurology

## 2021-07-29 MED ORDER — MENINGOCOCCAL VAC B (OMV) IM SUSY: 0.5000 mL | PREFILLED_SYRINGE | Freq: Once | INTRAMUSCULAR | 0 refills | Status: AC

## 2021-07-29 NOTE — Telephone Encounter (Signed)
Received a call from [facility] who states that she was able to find [facility] [place] has the [facility] (meningococcal vaccine). Order has been placed and faxed to the pharmacy for the pt to receive there.  ?

## 2021-08-06 DIAGNOSIS — G0491 Myelitis, unspecified: Secondary | ICD-10-CM | POA: Diagnosis not present

## 2021-08-06 DIAGNOSIS — G360 Neuromyelitis optica [Devic]: Secondary | ICD-10-CM | POA: Diagnosis not present

## 2021-08-14 ENCOUNTER — Ambulatory Visit
Admission: RE | Admit: 2021-08-14 | Discharge: 2021-08-14 | Disposition: A | Discharge status: Home / Self Care | Payer: Medicare HMO | Source: Ambulatory Visit | Attending: Internal Medicine | Admitting: Internal Medicine

## 2021-08-14 DIAGNOSIS — Z1231 Encounter for screening mammogram for malignant neoplasm of breast: Secondary | ICD-10-CM

## 2021-08-21 DIAGNOSIS — G0491 Myelitis, unspecified: Secondary | ICD-10-CM | POA: Diagnosis not present

## 2021-08-21 DIAGNOSIS — G360 Neuromyelitis optica [Devic]: Secondary | ICD-10-CM | POA: Diagnosis not present

## 2021-09-02 DIAGNOSIS — Z6831 Body mass index (BMI) 31.0-31.9, adult: Secondary | ICD-10-CM | POA: Diagnosis not present

## 2021-09-02 DIAGNOSIS — R5383 Other fatigue: Secondary | ICD-10-CM | POA: Diagnosis not present

## 2021-09-02 DIAGNOSIS — Z713 Dietary counseling and surveillance: Secondary | ICD-10-CM | POA: Diagnosis not present

## 2021-09-02 DIAGNOSIS — M329 Systemic lupus erythematosus, unspecified: Secondary | ICD-10-CM | POA: Diagnosis not present

## 2021-09-02 DIAGNOSIS — Z299 Encounter for prophylactic measures, unspecified: Secondary | ICD-10-CM | POA: Diagnosis not present

## 2021-09-02 DIAGNOSIS — Z6821 Body mass index (BMI) 21.0-21.9, adult: Secondary | ICD-10-CM | POA: Diagnosis not present

## 2021-09-04 DIAGNOSIS — G0491 Myelitis, unspecified: Secondary | ICD-10-CM | POA: Diagnosis not present

## 2021-09-04 DIAGNOSIS — G360 Neuromyelitis optica [Devic]: Secondary | ICD-10-CM | POA: Diagnosis not present

## 2021-09-18 DIAGNOSIS — G360 Neuromyelitis optica [Devic]: Secondary | ICD-10-CM | POA: Diagnosis not present

## 2021-09-18 DIAGNOSIS — G0491 Myelitis, unspecified: Secondary | ICD-10-CM | POA: Diagnosis not present

## 2021-10-02 DIAGNOSIS — G0491 Myelitis, unspecified: Secondary | ICD-10-CM | POA: Diagnosis not present

## 2021-10-02 DIAGNOSIS — G360 Neuromyelitis optica [Devic]: Secondary | ICD-10-CM | POA: Diagnosis not present

## 2021-10-08 DIAGNOSIS — Z79891 Long term (current) use of opiate analgesic: Secondary | ICD-10-CM | POA: Diagnosis not present

## 2021-10-08 DIAGNOSIS — R251 Tremor, unspecified: Secondary | ICD-10-CM | POA: Diagnosis not present

## 2021-10-08 DIAGNOSIS — M130 Polyarthritis, unspecified: Secondary | ICD-10-CM | POA: Diagnosis not present

## 2021-10-08 DIAGNOSIS — G43709 Chronic migraine without aura, not intractable, without status migrainosus: Secondary | ICD-10-CM | POA: Diagnosis not present

## 2021-10-08 DIAGNOSIS — M545 Low back pain, unspecified: Secondary | ICD-10-CM | POA: Diagnosis not present

## 2021-10-08 DIAGNOSIS — B029 Zoster without complications: Secondary | ICD-10-CM | POA: Diagnosis not present

## 2021-10-17 DIAGNOSIS — G360 Neuromyelitis optica [Devic]: Secondary | ICD-10-CM | POA: Diagnosis not present

## 2021-10-17 DIAGNOSIS — G0491 Myelitis, unspecified: Secondary | ICD-10-CM | POA: Diagnosis not present

## 2021-10-20 ENCOUNTER — Other Ambulatory Visit: Payer: Self-pay | Admitting: Neurology

## 2021-10-20 ENCOUNTER — Other Ambulatory Visit (HOSPITAL_COMMUNITY): Payer: Self-pay | Admitting: Neurology

## 2021-10-21 ENCOUNTER — Other Ambulatory Visit: Payer: Self-pay | Admitting: Neurology

## 2021-10-21 ENCOUNTER — Other Ambulatory Visit (HOSPITAL_COMMUNITY): Payer: Self-pay | Admitting: Neurology

## 2021-10-23 ENCOUNTER — Other Ambulatory Visit: Payer: Self-pay | Admitting: Neurology

## 2021-10-23 ENCOUNTER — Other Ambulatory Visit (HOSPITAL_COMMUNITY): Payer: Self-pay | Admitting: Neurology

## 2021-10-24 ENCOUNTER — Other Ambulatory Visit: Payer: Self-pay | Admitting: Neurology

## 2021-10-24 ENCOUNTER — Other Ambulatory Visit (HOSPITAL_COMMUNITY): Payer: Self-pay | Admitting: Neurology

## 2021-10-24 DIAGNOSIS — I82403 Acute embolism and thrombosis of unspecified deep veins of lower extremity, bilateral: Secondary | ICD-10-CM

## 2021-10-28 ENCOUNTER — Telehealth: Payer: Self-pay | Admitting: Neurology

## 2021-10-28 NOTE — Telephone Encounter (Signed)
LVM and mychart msg for need to reschedule 7/20 appt - MD out

## 2021-10-30 DIAGNOSIS — G0491 Myelitis, unspecified: Secondary | ICD-10-CM | POA: Diagnosis not present

## 2021-10-30 DIAGNOSIS — G360 Neuromyelitis optica [Devic]: Secondary | ICD-10-CM | POA: Diagnosis not present

## 2021-11-04 ENCOUNTER — Ambulatory Visit (HOSPITAL_COMMUNITY): Payer: Medicare HMO

## 2021-11-12 ENCOUNTER — Encounter (HOSPITAL_COMMUNITY): Payer: Self-pay

## 2021-11-12 ENCOUNTER — Ambulatory Visit (HOSPITAL_COMMUNITY): Payer: Medicare HMO

## 2021-11-13 DIAGNOSIS — G0491 Myelitis, unspecified: Secondary | ICD-10-CM | POA: Diagnosis not present

## 2021-11-13 DIAGNOSIS — G360 Neuromyelitis optica [Devic]: Secondary | ICD-10-CM | POA: Diagnosis not present

## 2021-11-18 ENCOUNTER — Ambulatory Visit (HOSPITAL_COMMUNITY): Payer: Medicare HMO

## 2021-11-21 ENCOUNTER — Ambulatory Visit: Payer: Medicare HMO | Admitting: Neurology

## 2021-11-21 ENCOUNTER — Ambulatory Visit (HOSPITAL_COMMUNITY)
Admission: RE | Admit: 2021-11-21 | Discharge: 2021-11-21 | Disposition: A | Discharge status: Home / Self Care | Payer: Medicare HMO | Source: Ambulatory Visit | Attending: Neurology | Admitting: Neurology

## 2021-11-21 DIAGNOSIS — I82403 Acute embolism and thrombosis of unspecified deep veins of lower extremity, bilateral: Secondary | ICD-10-CM | POA: Diagnosis not present

## 2021-11-21 DIAGNOSIS — M79604 Pain in right leg: Secondary | ICD-10-CM | POA: Diagnosis not present

## 2021-11-21 DIAGNOSIS — M79605 Pain in left leg: Secondary | ICD-10-CM | POA: Diagnosis not present

## 2021-11-28 ENCOUNTER — Encounter (HOSPITAL_COMMUNITY): Payer: Self-pay | Admitting: Emergency Medicine

## 2021-11-28 ENCOUNTER — Emergency Department (HOSPITAL_COMMUNITY)
Admission: EM | Admit: 2021-11-28 | Discharge: 2021-11-28 | Disposition: A | Discharge status: Home / Self Care | Payer: Medicare HMO | Attending: Emergency Medicine | Admitting: Emergency Medicine

## 2021-11-28 ENCOUNTER — Emergency Department (HOSPITAL_COMMUNITY): Payer: Medicare HMO

## 2021-11-28 ENCOUNTER — Other Ambulatory Visit: Payer: Self-pay

## 2021-11-28 DIAGNOSIS — K5903 Drug induced constipation: Secondary | ICD-10-CM | POA: Diagnosis not present

## 2021-11-28 DIAGNOSIS — K5900 Constipation, unspecified: Secondary | ICD-10-CM | POA: Diagnosis not present

## 2021-11-28 DIAGNOSIS — Z8719 Personal history of other diseases of the digestive system: Secondary | ICD-10-CM | POA: Diagnosis not present

## 2021-11-28 DIAGNOSIS — K6389 Other specified diseases of intestine: Secondary | ICD-10-CM | POA: Diagnosis not present

## 2021-11-28 DIAGNOSIS — R109 Unspecified abdominal pain: Secondary | ICD-10-CM | POA: Diagnosis not present

## 2021-11-28 DIAGNOSIS — M419 Scoliosis, unspecified: Secondary | ICD-10-CM | POA: Diagnosis not present

## 2021-11-28 DIAGNOSIS — K760 Fatty (change of) liver, not elsewhere classified: Secondary | ICD-10-CM | POA: Diagnosis not present

## 2021-11-28 LAB — CBC WITH DIFFERENTIAL/PLATELET
Abs Immature Granulocytes: 0.01 10*3/uL (ref 0.00–0.07)
Basophils Absolute: 0 10*3/uL (ref 0.0–0.1)
Basophils Relative: 1 %
Eosinophils Absolute: 0.1 10*3/uL (ref 0.0–0.5)
Eosinophils Relative: 1 %
HCT: 38 % (ref 36.0–46.0)
Hemoglobin: 12.7 g/dL (ref 12.0–15.0)
Immature Granulocytes: 0 %
Lymphocytes Relative: 23 %
Lymphs Abs: 1.4 10*3/uL (ref 0.7–4.0)
MCH: 30.7 pg (ref 26.0–34.0)
MCHC: 33.4 g/dL (ref 30.0–36.0)
MCV: 91.8 fL (ref 80.0–100.0)
Monocytes Absolute: 0.5 10*3/uL (ref 0.1–1.0)
Monocytes Relative: 7 %
Neutro Abs: 4.2 10*3/uL (ref 1.7–7.7)
Neutrophils Relative %: 68 %
Platelets: 142 10*3/uL — ABNORMAL LOW (ref 150–400)
RBC: 4.14 MIL/uL (ref 3.87–5.11)
RDW: 13.9 % (ref 11.5–15.5)
WBC: 6.1 10*3/uL (ref 4.0–10.5)
nRBC: 0 % (ref 0.0–0.2)

## 2021-11-28 LAB — COMPREHENSIVE METABOLIC PANEL
ALT: 10 U/L (ref 0–44)
AST: 16 U/L (ref 15–41)
Albumin: 4 g/dL (ref 3.5–5.0)
Alkaline Phosphatase: 73 U/L (ref 38–126)
Anion gap: 8 (ref 5–15)
BUN: 8 mg/dL (ref 8–23)
CO2: 26 mmol/L (ref 22–32)
Calcium: 8.7 mg/dL — ABNORMAL LOW (ref 8.9–10.3)
Chloride: 101 mmol/L (ref 98–111)
Creatinine, Ser: 0.88 mg/dL (ref 0.44–1.00)
GFR, Estimated: >60 mL/min (ref >60)
Glucose, Bld: 92 mg/dL (ref 70–99)
Potassium: 3.8 mmol/L (ref 3.5–5.1)
Sodium: 135 mmol/L (ref 135–145)
Total Bilirubin: 0.6 mg/dL (ref 0.3–1.2)
Total Protein: 7.5 g/dL (ref 6.5–8.1)

## 2021-11-28 LAB — URINALYSIS, ROUTINE W REFLEX MICROSCOPIC
Bilirubin Urine: NEGATIVE
Glucose, UA: NEGATIVE mg/dL
Ketones, ur: NEGATIVE mg/dL
Nitrite: NEGATIVE
Protein, ur: NEGATIVE mg/dL
Specific Gravity, Urine: 1.016 (ref 1.005–1.030)
WBC, UA: >50 WBC/hpf — ABNORMAL HIGH (ref 0–5)
pH: 7 (ref 5.0–8.0)

## 2021-11-28 MED ORDER — IOHEXOL 300 MG/ML  SOLN
100.0000 mL | Freq: Once | INTRAMUSCULAR | Status: AC | PRN
  Administered 2021-11-28: 100 mL via INTRAVENOUS

## 2021-11-28 MED ORDER — MILK OF MAGNESIA 7.75 % PO SUSP
30.0000 mL | Freq: Every day | ORAL | 0 refills | Status: DC | PRN
Start: 2021-11-28 — End: 2024-02-15

## 2021-11-28 MED ORDER — POLYETHYLENE GLYCOL 3350 17 G PO PACK: 17.0000 g | PACK | Freq: Every day | ORAL | 0 refills | Status: DC

## 2021-11-28 NOTE — ED Triage Notes (Addendum)
Pt c/o constipation. Has had intestinal  blockage years ago. C/o pain worse on left side of abd. Lnbm x 4 days ago and her normal is once a day. Pt abd soft and nondistended but tender with palpation to left side abd. A/o. Color wnl. Pt has tried multiple OTC meds for constipation with no relief.

## 2021-11-28 NOTE — Discharge Instructions (Signed)
CT scan of the abdomen and pelvis demonstrated constipation only.  No bowel obstruction.  Medication sent to your pharmacy for constipation including milk of magnesia.  Please take 30 L daily till you have normal bowel movements.

## 2021-11-28 NOTE — ED Provider Notes (Signed)
[facility] EMERGENCY DEPARTMENT Provider Note   CSN: [id number] Arrival date & time: 11/28/21  1617     History  Chief Complaint  Patient presents with   Abdominal Pain    Susan Hodge is a 71 y.o. female.  Patient is a 71 year old female with past medical history of chronic opioid use presenting for complaints of abdominal pain and constipation.  Patient midst to abdominal fullness, nausea without vomiting, and constipation x4 days.  States she normally has bowel movements every day.  Reports her pain specialist started her on Movatik 2 months ago for opiate-induced constipation which gave her significant relief initially.  States is no longer working at this time.  No prior history of bowel obstruction.  The history is provided by the patient. No language interpreter was used.  Abdominal Pain Associated symptoms: constipation and nausea   Associated symptoms: no chest pain, no chills, no cough, no dysuria, no fever, no hematuria, no shortness of breath, no sore throat and no vomiting        Home Medications Prior to Admission medications   Medication Sig Start Date End Date Taking? Authorizing Provider  magnesium hydroxide (MILK OF MAGNESIA) 400 MG/5ML suspension Take 30 mLs by mouth daily as needed for mild constipation. 3/53/61  Yes Campbell Stall P, DO  polyethylene glycol (MIRALAX) 17 g packet Take 17 g by mouth daily. 4/43/15  Yes Campbell Stall P, DO  acetaminophen (TYLENOL) 325 MG tablet Take 2 tablets (650 mg total) by mouth every 6 (six) hours as needed for mild pain (or Fever >/= 101). 08/18/18   Angiulli, Lavon Paganini, PA-C  ALPRAZolam Duanne Moron) 0.5 MG tablet Take 1 tablet (0.5 mg total) by mouth 3 (three) times daily as needed for anxiety or sleep. 01/03/19   Roxan Hockey, MD  amoxicillin (AMOXIL) 250 MG capsule Take 250 mg by mouth 3 (three) times daily as needed (for ear infection/inflammation).     [provider]  baclofen (LIORESAL) 10 MG tablet 1/2 to one  pill up to 5 a day 12/20/19   Sater, Nanine Means, MD  Butalbital-APAP-Caffeine (FIORICET PO) Take by mouth.    [provider]  Cyanocobalamin (VITAMIN B-12 PO) Take 2 tablets by mouth daily.    [provider]  darifenacin (ENABLEX) 15 MG 24 hr tablet TAKE 1 TABLET BY MOUTH DAILY 06/09/19   Lomax, Amy, NP  DULoxetine (CYMBALTA) 20 MG capsule duloxetine 20 mg capsule,delayed release  Take 1 capsule every day by oral route.    [provider]  Eculizumab (SOLIRIS IV) Inject 1,200 mg into the vein every 14 (fourteen) days.     Sater, Nanine Means, MD  Galcanezumab-gnlm (EMGALITY) 120 MG/ML SOAJ Inject 120 mg into the skin every 30 (thirty) days.    [provider]  lidocaine (LIDODERM) 5 % Place 1 patch onto the skin every 12 (twelve) hours. 12/01/18   [provider]  loratadine (CLARITIN) 10 MG tablet Take 10 mg by mouth daily.    [provider]  ondansetron (ZOFRAN) 4 MG tablet Take 1 tablet (4 mg total) by mouth every 6 (six) hours as needed for nausea. 01/03/19   Roxan Hockey, MD  oxyCODONE-acetaminophen (PERCOCET) 10-325 MG tablet Take 1 tablet by mouth every 6 (six) hours as needed. 01/03/19   Roxan Hockey, MD  pantoprazole (PROTONIX) 40 MG tablet Take 1 tablet (40 mg total) by mouth 2 (two) times daily before a meal. 01/03/19   Emokpae, Courage, MD  polyethylene glycol (MIRALAX) 17  g packet Take 17 g by mouth every morning. 01/03/19   Roxan Hockey, MD  potassium chloride SA (KLOR-CON) 20 MEQ tablet Take 20 mEq by mouth 2 (two) times daily.    [provider]  pregabalin (LYRICA) 50 MG capsule Take 50 mg by mouth 3 (three) times daily.    [provider]  UNABLE TO FIND Med Name: CBD OIL that she uses topically for pain.    [provider]  UNABLE TO FIND daily. Med Name: fiber gummies    [provider]  valproic acid (DEPAKENE) 250 MG capsule Take 1 capsule (250 mg total) by mouth at bedtime. 08/18/18    Angiulli, Lavon Paganini, PA-C  Vitamin D, Ergocalciferol, (DRISDOL) 1.25 MG (50000 UT) CAPS capsule Take 1 capsule (50,000 Units total) by mouth every Monday. 08/23/18   Angiulli, Lavon Paganini, PA-C  vitamin E 1000 UNIT capsule Take 1,000 Units by mouth daily.    [provider]      Allergies    Prednisone, Amitriptyline, Bee venom, Cymbalta [duloxetine hcl], Fexofenadine, Furosemide, Gabapentin, Hydroxychloroquine sulfate, Morphine, Nsaids, Oxycodone, and Topiramate    Review of Systems   Review of Systems  Constitutional:  Negative for chills and fever.  HENT:  Negative for ear pain and sore throat.   Eyes:  Negative for pain and visual disturbance.  Respiratory:  Negative for cough and shortness of breath.   Cardiovascular:  Negative for chest pain and palpitations.  Gastrointestinal:  Positive for abdominal pain, constipation and nausea. Negative for vomiting.  Genitourinary:  Negative for dysuria and hematuria.  Musculoskeletal:  Negative for arthralgias and back pain.  Skin:  Negative for color change and rash.  Neurological:  Negative for seizures and syncope.  All other systems reviewed and are negative.   Physical Exam Updated Vital Signs BP 105/77   Pulse (!) 56   Temp 97.6 F (36.4 C) (Oral)   Resp 18   SpO2 100%  Physical Exam Vitals and nursing note reviewed.  Constitutional:      General: She is not in acute distress.    Appearance: She is well-developed.  HENT:     Head: Normocephalic and atraumatic.  Eyes:     Conjunctiva/sclera: Conjunctivae normal.  Cardiovascular:     Rate and Rhythm: Normal rate and regular rhythm.     Heart sounds: No murmur heard. Pulmonary:     Effort: Pulmonary effort is normal. No respiratory distress.     Breath sounds: Normal breath sounds.  Abdominal:     Palpations: Abdomen is soft.     Tenderness: There is no abdominal tenderness.  Musculoskeletal:        General: No swelling.     Cervical back: Neck supple.  Skin:     General: Skin is warm and dry.     Capillary Refill: Capillary refill takes less than 2 seconds.  Neurological:     Mental Status: She is alert.  Psychiatric:        Mood and Affect: Mood normal.     ED Results / Procedures / Treatments   Labs (all labs ordered are listed, but only abnormal results are displayed) Labs Reviewed  CBC WITH DIFFERENTIAL/PLATELET - Abnormal; Notable for the following components:      Result Value   Platelets 142 (*)    All other components within normal limits  COMPREHENSIVE METABOLIC PANEL - Abnormal; Notable for the following components:   Calcium 8.7 (*)    All other components within normal  limits  URINALYSIS, ROUTINE W REFLEX MICROSCOPIC - Abnormal; Notable for the following components:   APPearance CLOUDY (*)    Hgb urine dipstick SMALL (*)    Leukocytes,Ua LARGE (*)    WBC, UA >50 (*)    Bacteria, UA RARE (*)    All other components within normal limits    EKG None  Radiology CT ABDOMEN PELVIS W CONTRAST  Result Date: 11/28/2021 CLINICAL DATA:  Bowel obstruction suspected. Constipation, nausea, and abdominal fullness. EXAM: CT ABDOMEN AND PELVIS WITH CONTRAST TECHNIQUE: Multidetector CT imaging of the abdomen and pelvis was performed using the standard protocol following bolus administration of intravenous contrast. RADIATION DOSE REDUCTION: This exam was performed according to the departmental dose-optimization program which includes automated exposure control, adjustment of the mA and/or kV according to patient size and/or use of iterative reconstruction technique. CONTRAST:  124m OMNIPAQUE IOHEXOL 300 MG/ML  SOLN COMPARISON:  11/13/2020 FINDINGS: Lower chest: Lung bases are clear.  Cardiac enlargement. Hepatobiliary: No focal liver abnormality is seen. Status post cholecystectomy. No biliary dilatation. Mild diffuse fatty infiltration of the liver. Pancreas: Pancreatic parenchyma is mostly atrophic. Cystic lesion in the uncinate process of  the pancreas measuring 1.9 cm diameter. No change in comparison to previous studies dating back to 12/31/2018. Spleen: Normal in size without focal abnormality. Adrenals/Urinary Tract: Adrenal glands are unremarkable. Kidneys are normal, without renal calculi, focal lesion, or hydronephrosis. Bladder is unremarkable. Stomach/Bowel: Stomach, small bowel, and colon are not abnormally distended. Stool diffusely fills the colon. No wall thickening or inflammatory changes. Appendix is not identified. Vascular/Lymphatic: Aortic atherosclerosis. No enlarged abdominal or pelvic lymph nodes. Reproductive: Uterus is retroverted. Low-attenuation nodule in the fundus consistent with fibroid. No change. No abnormal adnexal masses. Other: No free air or free fluid in the abdomen. Abdominal wall musculature appears intact. Musculoskeletal: Degenerative changes.  No acute bony abnormalities. IMPRESSION: 1. No acute process demonstrated in the abdomen or pelvis. No evidence of bowel obstruction or inflammation. 2. Stable appearance of cystic lesion in the uncinate process of the pancreas at maximal diameter 1.9 cm. Follow-up recommended every 2 years. 3. Aortic atherosclerosis. 4. Stable appearance of uterine fibroid. Electronically Signed   By: WLucienne CapersM.D.   On: 11/28/2021 19:31   DG Abdomen Acute W/Chest  Result Date: 11/28/2021 CLINICAL DATA:  Abdominal pain greater on LEFT, constipation, history of intestinal blockage EXAM: DG ABDOMEN ACUTE WITH 1 VIEW CHEST COMPARISON:  None FINDINGS: Normal heart size, mediastinal contours, and pulmonary vascularity. Atherosclerotic calcification aorta. Lungs clear. No pulmonary infiltrate, pleural effusion, or pneumothorax. Nonobstructive bowel gas pattern. No bowel dilatation, bowel wall thickening, or free air. Surgical clips RIGHT upper quadrant question cholecystectomy. Bones demineralized with dextroconvex thoracolumbar scoliosis. IMPRESSION: No acute abnormalities. Aortic  Atherosclerosis (ICD10-I70.0). Electronically Signed   By: MLavonia DanaM.D.   On: 11/28/2021 17:34    Procedures Procedures    Medications Ordered in ED Medications  iohexol (OMNIPAQUE) 300 MG/ML solution 100 mL (100 mLs Intravenous Contrast Given 11/28/21 1902)    ED Course/ Medical Decision Making/ A&P                           Medical Decision Making Amount and/or Complexity of Data Reviewed Labs: ordered. Radiology: ordered.  Risk Prescription drug management.   [id number] 71year old female with past medical history of chronic opioid use presenting for complaints of abdominal pain and constipation.  Is alert oriented x3, no acute distress, afebrile, stable  vital signs.  Physical exam demonstrates soft abdomen with no tenderness to palpation.  CT scan demonstrates no acute intra-abdominal process.  No bowel obstruction.  Patient discharged home with medication for constipation including MiraLAX and milk of magnesia.  Patient in no distress and overall condition improved here in the ED. Detailed discussions were had with the patient regarding current findings, and need for close f/u with PCP or on call doctor. The patient has been instructed to return immediately if the symptoms worsen in any way for re-evaluation. Patient verbalized understanding and is in agreement with current care plan. All questions answered prior to discharge.         Final Clinical Impression(s) / ED Diagnoses Final diagnoses:  Drug-induced constipation    Rx / DC Orders ED Discharge Orders          Ordered    magnesium hydroxide (MILK OF MAGNESIA) 400 MG/5ML suspension  Daily PRN        11/28/21 2019    polyethylene glycol (MIRALAX) 17 g packet  Daily        11/28/21 2019              Lianne Cure, DO 70/35/00 2020

## 2021-12-02 DIAGNOSIS — G360 Neuromyelitis optica [Devic]: Secondary | ICD-10-CM | POA: Diagnosis not present

## 2021-12-02 DIAGNOSIS — G0491 Myelitis, unspecified: Secondary | ICD-10-CM | POA: Diagnosis not present

## 2021-12-05 DIAGNOSIS — H5200 Hypermetropia, unspecified eye: Secondary | ICD-10-CM | POA: Diagnosis not present

## 2021-12-05 DIAGNOSIS — Z0100 Encounter for examination of eyes and vision without abnormal findings: Secondary | ICD-10-CM | POA: Diagnosis not present

## 2021-12-06 DIAGNOSIS — R69 Illness, unspecified: Secondary | ICD-10-CM | POA: Diagnosis not present

## 2021-12-06 DIAGNOSIS — F17210 Nicotine dependence, cigarettes, uncomplicated: Secondary | ICD-10-CM | POA: Diagnosis not present

## 2021-12-06 DIAGNOSIS — G369 Acute disseminated demyelination, unspecified: Secondary | ICD-10-CM | POA: Diagnosis not present

## 2021-12-06 DIAGNOSIS — F1320 Sedative, hypnotic or anxiolytic dependence, uncomplicated: Secondary | ICD-10-CM | POA: Diagnosis not present

## 2021-12-06 DIAGNOSIS — I700 Atherosclerosis of aorta: Secondary | ICD-10-CM | POA: Diagnosis not present

## 2021-12-06 DIAGNOSIS — Z299 Encounter for prophylactic measures, unspecified: Secondary | ICD-10-CM | POA: Diagnosis not present

## 2021-12-06 DIAGNOSIS — G8190 Hemiplegia, unspecified affecting unspecified side: Secondary | ICD-10-CM | POA: Diagnosis not present

## 2021-12-06 DIAGNOSIS — Z6821 Body mass index (BMI) 21.0-21.9, adult: Secondary | ICD-10-CM | POA: Diagnosis not present

## 2021-12-17 DIAGNOSIS — G0491 Myelitis, unspecified: Secondary | ICD-10-CM | POA: Diagnosis not present

## 2021-12-17 DIAGNOSIS — G360 Neuromyelitis optica [Devic]: Secondary | ICD-10-CM | POA: Diagnosis not present

## 2021-12-31 DIAGNOSIS — M545 Low back pain, unspecified: Secondary | ICD-10-CM | POA: Diagnosis not present

## 2021-12-31 DIAGNOSIS — G43709 Chronic migraine without aura, not intractable, without status migrainosus: Secondary | ICD-10-CM | POA: Diagnosis not present

## 2021-12-31 DIAGNOSIS — R251 Tremor, unspecified: Secondary | ICD-10-CM | POA: Diagnosis not present

## 2021-12-31 DIAGNOSIS — B029 Zoster without complications: Secondary | ICD-10-CM | POA: Diagnosis not present

## 2021-12-31 DIAGNOSIS — Z79891 Long term (current) use of opiate analgesic: Secondary | ICD-10-CM | POA: Diagnosis not present

## 2021-12-31 DIAGNOSIS — M130 Polyarthritis, unspecified: Secondary | ICD-10-CM | POA: Diagnosis not present

## 2022-01-09 DIAGNOSIS — G360 Neuromyelitis optica [Devic]: Secondary | ICD-10-CM | POA: Diagnosis not present

## 2022-01-09 DIAGNOSIS — G0491 Myelitis, unspecified: Secondary | ICD-10-CM | POA: Diagnosis not present

## 2022-01-23 DIAGNOSIS — G360 Neuromyelitis optica [Devic]: Secondary | ICD-10-CM | POA: Diagnosis not present

## 2022-01-23 DIAGNOSIS — G0491 Myelitis, unspecified: Secondary | ICD-10-CM | POA: Diagnosis not present

## 2022-02-04 ENCOUNTER — Telehealth: Payer: Self-pay | Admitting: Neurology

## 2022-02-04 NOTE — Telephone Encounter (Signed)
Pt is calling. Stated the Pain [facility] is closing in Dec and she wants to know if Dr. Felecia Shelling will prescribe her oxyCODONE-acetaminophen (PERCOCET) 10-325 MG tablet. Pt is requesting a call back from nurse.

## 2022-02-04 NOTE — Telephone Encounter (Signed)
Called the patient back and informed her that our office doesn't prescribe pain medication. Advised that our office doesn't provide pain medication and we would recommend that she is set up with a alternative pain management clinic. She has a list that was provided and she will call around and see who takes her insurance.  Pt verbalized understanding.

## 2022-02-06 DIAGNOSIS — G0491 Myelitis, unspecified: Secondary | ICD-10-CM | POA: Diagnosis not present

## 2022-02-06 DIAGNOSIS — G360 Neuromyelitis optica [Devic]: Secondary | ICD-10-CM | POA: Diagnosis not present

## 2022-02-20 DIAGNOSIS — G0491 Myelitis, unspecified: Secondary | ICD-10-CM | POA: Diagnosis not present

## 2022-02-20 DIAGNOSIS — G360 Neuromyelitis optica [Devic]: Secondary | ICD-10-CM | POA: Diagnosis not present

## 2022-02-24 ENCOUNTER — Ambulatory Visit: Payer: Medicare HMO | Admitting: Neurology

## 2022-02-24 ENCOUNTER — Telehealth: Payer: Self-pay | Admitting: Neurology

## 2022-02-24 NOTE — Telephone Encounter (Signed)
Pt cancelled appt due to transportation issues.

## 2022-02-26 DIAGNOSIS — Z299 Encounter for prophylactic measures, unspecified: Secondary | ICD-10-CM | POA: Diagnosis not present

## 2022-02-26 DIAGNOSIS — R69 Illness, unspecified: Secondary | ICD-10-CM | POA: Diagnosis not present

## 2022-02-26 DIAGNOSIS — Z6822 Body mass index (BMI) 22.0-22.9, adult: Secondary | ICD-10-CM | POA: Diagnosis not present

## 2022-02-26 DIAGNOSIS — F17210 Nicotine dependence, cigarettes, uncomplicated: Secondary | ICD-10-CM | POA: Diagnosis not present

## 2022-02-26 DIAGNOSIS — R569 Unspecified convulsions: Secondary | ICD-10-CM | POA: Diagnosis not present

## 2022-02-26 DIAGNOSIS — G369 Acute disseminated demyelination, unspecified: Secondary | ICD-10-CM | POA: Diagnosis not present

## 2022-02-26 DIAGNOSIS — G0491 Myelitis, unspecified: Secondary | ICD-10-CM | POA: Diagnosis not present

## 2022-02-26 DIAGNOSIS — Z0000 Encounter for general adult medical examination without abnormal findings: Secondary | ICD-10-CM | POA: Diagnosis not present

## 2022-03-06 DIAGNOSIS — G360 Neuromyelitis optica [Devic]: Secondary | ICD-10-CM | POA: Diagnosis not present

## 2022-03-06 DIAGNOSIS — G0491 Myelitis, unspecified: Secondary | ICD-10-CM | POA: Diagnosis not present

## 2022-03-15 ENCOUNTER — Encounter (INDEPENDENT_AMBULATORY_CARE_PROVIDER_SITE_OTHER): Payer: Self-pay | Admitting: Gastroenterology

## 2022-03-24 DIAGNOSIS — G0491 Myelitis, unspecified: Secondary | ICD-10-CM | POA: Diagnosis not present

## 2022-03-24 DIAGNOSIS — G360 Neuromyelitis optica [Devic]: Secondary | ICD-10-CM | POA: Diagnosis not present

## 2022-03-25 DIAGNOSIS — R251 Tremor, unspecified: Secondary | ICD-10-CM | POA: Diagnosis not present

## 2022-03-25 DIAGNOSIS — B029 Zoster without complications: Secondary | ICD-10-CM | POA: Diagnosis not present

## 2022-03-25 DIAGNOSIS — M130 Polyarthritis, unspecified: Secondary | ICD-10-CM | POA: Diagnosis not present

## 2022-03-25 DIAGNOSIS — M545 Low back pain, unspecified: Secondary | ICD-10-CM | POA: Diagnosis not present

## 2022-03-25 DIAGNOSIS — G43709 Chronic migraine without aura, not intractable, without status migrainosus: Secondary | ICD-10-CM | POA: Diagnosis not present

## 2022-03-25 DIAGNOSIS — Z79891 Long term (current) use of opiate analgesic: Secondary | ICD-10-CM | POA: Diagnosis not present

## 2022-04-09 DIAGNOSIS — G360 Neuromyelitis optica [Devic]: Secondary | ICD-10-CM | POA: Diagnosis not present

## 2022-04-09 DIAGNOSIS — G0491 Myelitis, unspecified: Secondary | ICD-10-CM | POA: Diagnosis not present

## 2022-05-15 DIAGNOSIS — G0491 Myelitis, unspecified: Secondary | ICD-10-CM | POA: Diagnosis not present

## 2022-05-15 DIAGNOSIS — G360 Neuromyelitis optica [Devic]: Secondary | ICD-10-CM | POA: Diagnosis not present

## 2022-05-27 ENCOUNTER — Telehealth: Payer: Self-pay | Admitting: *Deleted

## 2022-05-27 NOTE — Telephone Encounter (Signed)
[facility] has been notified of updated appt

## 2022-05-27 NOTE — Telephone Encounter (Signed)
Pt on Soliris every 2 wk per [facility]. She will need updated visit before any further infusions per infusion suite.  Can you please call pt ASAP and offer appt 06/02/22 at 1:30pm with Dr. Felecia Shelling and express importance of the visit in order to get any future infusions. Thank you!

## 2022-05-27 NOTE — Telephone Encounter (Signed)
Pt has been scheduled and is aware to check in at 1:00 for the 1:30 appointment for 06-02-22 with Dr Felecia Shelling

## 2022-06-02 ENCOUNTER — Ambulatory Visit: Payer: Medicare HMO | Admitting: Neurology

## 2022-06-03 ENCOUNTER — Encounter: Payer: Self-pay | Admitting: Neurology

## 2022-06-03 ENCOUNTER — Ambulatory Visit: Payer: Medicare HMO | Admitting: Neurology

## 2022-06-03 VITALS — BP 113/54 | HR 61 | Ht 60.0 in | Wt 132.2 lb

## 2022-06-03 DIAGNOSIS — Z79899 Other long term (current) drug therapy: Secondary | ICD-10-CM | POA: Diagnosis not present

## 2022-06-03 DIAGNOSIS — G360 Neuromyelitis optica [Devic]: Secondary | ICD-10-CM

## 2022-06-03 DIAGNOSIS — N319 Neuromuscular dysfunction of bladder, unspecified: Secondary | ICD-10-CM

## 2022-06-03 DIAGNOSIS — G8194 Hemiplegia, unspecified affecting left nondominant side: Secondary | ICD-10-CM

## 2022-06-03 MED ORDER — BACLOFEN 10 MG PO TABS
ORAL_TABLET | ORAL | 11 refills | Status: DC
Start: 2022-06-03 — End: 2022-12-02

## 2022-06-03 NOTE — Progress Notes (Addendum)
[facility]  PATIENT: Susan Hodge DOB: 1950/09/20  REFERRING DOCTOR OR PCP:  [name], NP SOURCE: Patient, daughter, notes from hospital and rehab admissions, multiple imaging and lab reports, MRI images of brain, cervical, thoracic and lumbar spine were reviewed  _________________________________   HISTORICAL  CHIEF COMPLAINT:  Chief Complaint  Patient presents with   Follow-up    Patient is here in room 35, with son. Here for follow up neuromyelitis optica. No falls missed last Soliris infusions. Reports both feet swollen, patient report at one point it was only the left foot. Wear support stockings and elevates feet at night.  Reports she has a new pain MD scheduled on 07/15/22   Update 06/03/2022 She is on Soliris for Neuromyelitis optica every 2 weeks.  She was getting infusions at her home or at [facility] but had issues and is now doing infusions again in our office.  She tolerates it well.  She has not had any relapses since starting.  This compares to several relapses in rapid succession earlier in 2020.  She goes 200 feet and sometimes more with a walker. However, she tries not to walk too far without a rest.      She has weakness and spasticity, left > right.     She notes some shaking in the left side of her body at times.  She has left leg tingling.    Spasms also involve the trunk.    They are much better than a couple years ago and w just takes baclofen prn.    Vision is fine.    She has urinary urgency, helped by Enablex.   She also has back pain and leg pain form the knees down. .  This is worse with gait.  An ESI made her feel worse.   She has had issues tolerating steroids.       She has chronic migraines and takes [employer].  She has some HA every day but intensity is better.  She takes Fioricet as needed.   She is on Valproic acid for the migraine and h/o several seizures about 7-8 year ago.   She gets frequent right ear infection and take  drops or amoxicillin.     She had shingles and has itching in her right forehead and near her ear.   The distribution was V1 and she had corneal issues.  Eye sight is now back to normal.     She is on Lyrica 50 mg po tid.     She did the primary vaccinations initially and the Men B booster last year.    Neuromyelitis optica history: She began to experience clumsiness in mid March 2020.  She had been placed on topiramate for migraines and had recently and had some dizziness and vertigo initially.  She fell and a day later began to have urinary incontinence and inability to move the left leg.   She went to the ED at [facility] and an MRI of the lumbar spine shoowed degenerative disc changes.    She was prescribed a steroid pack and discharged.   The next day she was worse and her PCP sent her to the [facility] ED.   She had MRI of the brain and cervical and thoracic spine.   The MRI of the brain showed a remote right MCA stroke (she never had symptoms).   MRI of the spine showed multifocal lesions within the spinal cord, centrally but asymmetric.  She received 5 days of IV Solu-Medrol in the hospital.   She initially improved after the steroids and was using a walker well a couple weeks later at discharge from rehab.  Balance was still poor but strength was good.   Of note she had no arm weakness in March or April.   Over the past couple days, she has felt much weaker with more left leg > arm weakness.  .  She also has had right flank numbness and a dysesthetic sensation (pressure like pain) since yesterday.      Bladder impoved with steroids and is continuing to do well (she is on Enablex).    She had the meningitis vaccination (both the Pease and the B shots) and started [facility] June 2020.    MRIs The MRI of the brain 07/31/2018 shows a remote right MCA infarction with a small amount of associated hemosiderin.  There are also some scattered T2/flair hyperintense foci consistent with mild chronic  microvascular ischemic changes.  There were no acute findings.  The MRI of the cervical spine shows T2 hyperintense signal within the spinal cord posteriorly to the right adjacent to C2, posterior to the left adjacent to C3, and a little more to the right but central adjacent to C7.  The foci adjacent to C3 and C7 does have subtle enhancement after gadolinium was infused.  There are some degenerative changes at C4-C5, C5-C6 and C6-C7 but no definite nerve root compression and no spinal stenosis.   MRI of the thoracic spine shows diffuse patchy T2 hyperintensity that is predominantly central.   Number spine shows the abnormal signal within the spinal cord adjacent to T10 and T11.  There are degenerative changes at L3-L4, L4-L5 and L5-S1.  There is spinal stenosis at L4-L5.  With possibility for left L4 and L5 nerve root  Labs: Neuromyelitis optica IgG antibody was positive at 139 (< 3 normal) at diagnosis.  Lumbar puncture showed a small elevation of white blood cell count, predominantly lymphocytes and macrophages.  B12, copper were normal.  Herpes simplex virus and HIV were negative.  ACE was normal.  REVIEW OF SYSTEMS: Constitutional: No fevers, chills, sweats, or change in appetite Eyes: No visual changes, double vision, eye pain Ear, nose and throat: No hearing loss, ear pain, nasal congestion, sore throat Cardiovascular: No chest pain, palpitations Respiratory:  No shortness of breath at rest or with exertion.   No wheezes GastrointestinaI: No nausea, vomiting, diarrhea, abdominal pain, fecal incontinence Genitourinary: As above Musculoskeletal:  No neck pain, back pain Integumentary: No rash, pruritus, skin lesions Neurological: as above Psychiatric: No depression at this time.  No anxiety Endocrine: No palpitations, diaphoresis, change in appetite, change in weigh or increased thirst Hematologic/Lymphatic:  No anemia, purpura, petechiae. Allergic/Immunologic: No itchy/runny eyes, nasal  congestion, recent allergic reactions, rashes  ALLERGIES: Allergies  Allergen Reactions   Prednisone Other (See Comments)    Not to be given at any point while patient is still taking Soliris infusions.  Severe shaking and dehydration.   Amitriptyline Other (See Comments)   Bee Venom Other (See Comments)   Cymbalta [Duloxetine Hcl] Nausea Only    Constipation    Fexofenadine Other (See Comments)   Furosemide Other (See Comments)   Gabapentin Other (See Comments)   Hydroxychloroquine Sulfate Other (See Comments)   Morphine Other (See Comments)   Nsaids    Oxycodone Other (See Comments)    dizzy   Topiramate Other (See Comments)    HOME MEDICATIONS:  Current Outpatient Medications:    acetaminophen (TYLENOL) 325 MG tablet, Take 2 tablets (650 mg total) by mouth every 6 (six) hours as needed for mild pain (or Fever >/= 101)., Disp: , Rfl:    ALPRAZolam (XANAX) 0.5 MG tablet, Take 1 tablet (0.5 mg total) by mouth 3 (three) times daily as needed for anxiety or sleep., Disp: 10 tablet, Rfl: 0   amoxicillin (AMOXIL) 250 MG capsule, Take 250 mg by mouth 3 (three) times daily as needed (for ear infection/inflammation). , Disp: , Rfl:    baclofen (LIORESAL) 10 MG tablet, 1/2 to one pill up to 5 a day, Disp: 150 tablet, Rfl: 11   Butalbital-APAP-Caffeine (FIORICET PO), Take by mouth., Disp: , Rfl:    Cyanocobalamin (VITAMIN B-12 PO), Take 2 tablets by mouth daily., Disp: , Rfl:    darifenacin (ENABLEX) 15 MG 24 hr tablet, TAKE 1 TABLET BY MOUTH DAILY, Disp: 30 tablet, Rfl: 5   DULoxetine (CYMBALTA) 20 MG capsule, duloxetine 20 mg capsule,delayed release  Take 1 capsule every day by oral route., Disp: , Rfl:    Eculizumab (SOLIRIS IV), Inject 1,200 mg into the vein every 14 (fourteen) days. , Disp: , Rfl:    Galcanezumab-gnlm (EMGALITY) 120 MG/ML SOAJ, Inject 120 mg into the skin every 30 (thirty) days., Disp: , Rfl:    lidocaine (LIDODERM) 5 %, Place 1 patch onto the skin every 12 (twelve)  hours., Disp: , Rfl:    loratadine (CLARITIN) 10 MG tablet, Take 10 mg by mouth daily., Disp: , Rfl:    magnesium hydroxide (MILK OF MAGNESIA) 400 MG/5ML suspension, Take 30 mLs by mouth daily as needed for mild constipation., Disp: 355 mL, Rfl: 0   ondansetron (ZOFRAN) 4 MG tablet, Take 1 tablet (4 mg total) by mouth every 6 (six) hours as needed for nausea., Disp: 20 tablet, Rfl: 0   oxyCODONE-acetaminophen (PERCOCET) 10-325 MG tablet, Take 1 tablet by mouth every 6 (six) hours as needed., Disp: 10 tablet, Rfl: 0   pantoprazole (PROTONIX) 40 MG tablet, Take 1 tablet (40 mg total) by mouth 2 (two) times daily before a meal., Disp: 60 tablet, Rfl: 1   polyethylene glycol (MIRALAX) 17 g packet, Take 17 g by mouth every morning., Disp: 30 each, Rfl: 3   polyethylene glycol (MIRALAX) 17 g packet, Take 17 g by mouth daily., Disp: 14 each, Rfl: 0   potassium chloride SA (KLOR-CON) 20 MEQ tablet, Take 20 mEq by mouth 2 (two) times daily., Disp: , Rfl:    pregabalin (LYRICA) 50 MG capsule, Take 50 mg by mouth 3 (three) times daily., Disp: , Rfl:    UNABLE TO FIND, Med Name: CBD OIL that she uses topically for pain., Disp: , Rfl:    UNABLE TO FIND, daily. Med Name: fiber gummies, Disp: , Rfl:    valproic acid (DEPAKENE) 250 MG capsule, Take 1 capsule (250 mg total) by mouth at bedtime., Disp: 30 capsule, Rfl: 1   Vitamin D, Ergocalciferol, (DRISDOL) 1.25 MG (50000 UT) CAPS capsule, Take 1 capsule (50,000 Units total) by mouth every Monday., Disp: 4 capsule, Rfl: 0   vitamin E 1000 UNIT capsule, Take 1,000 Units by mouth daily., Disp: , Rfl:   PAST MEDICAL HISTORY: Past Medical History:  Diagnosis Date   Chest pain    Collagen vascular disease ([place])    Complication of anesthesia    Blisters to mouth.   Depression    DJD (degenerative joint disease)    FH: CVA (cerebrovascular accident)  father   Fibromyalgia    GERD (gastroesophageal reflux disease)    Hypothyroidism    Lupus (HCC)     Migraines    Palpitations    Status post cholecystectomy    appendectomy and bilaateral oophorectomy   Tobacco use     PAST SURGICAL HISTORY: Past Surgical History:  Procedure Laterality Date   BILATERAL OOPHORECTOMY     CARDIAC CATHETERIZATION     CHOLECYSTECTOMY     COLONOSCOPY WITH PROPOFOL N/A 01/03/2019   Procedure: COLONOSCOPY WITH PROPOFOL;  Surgeon: Rogene Houston, MD;  [facility];  Service: Endoscopy;  Laterality: N/A;   ESOPHAGOGASTRODUODENOSCOPY (EGD) WITH PROPOFOL N/A 01/02/2019   Procedure: ESOPHAGOGASTRODUODENOSCOPY (EGD) WITH PROPOFOL;  Surgeon: Rogene Houston, MD;  Location: [facility];  Service: Endoscopy;  Laterality: N/A;   POLYPECTOMY  01/03/2019   Procedure: POLYPECTOMY;  Surgeon: Rogene Houston, MD;  [facility]: [facility];  Service: Endoscopy;;    FAMILY HISTORY: Family History  Problem Relation Age of Onset   Stroke Father     SOCIAL HISTORY:  Social History   Socioeconomic History   Marital status: Single    Spouse name: Not on file   Number of children: 3   Years of education: Not on file   Highest education level: Not on file  Occupational History   Occupation: disability  Tobacco Use   Smoking status: Every Day    Packs/day: 0.50    Types: Cigarettes   Smokeless tobacco: Never   Tobacco comments:    quit last week 01/03/19  Vaping Use   Vaping Use: Never used  Substance and Sexual Activity   Alcohol use: No   Drug use: Never   Sexual activity: Not on file  Other Topics Concern   Not on file  Social History Narrative   Friend lives with her   Caffeine use: coffee daily   Left handed    Social Determinants of Health   Financial Resource Strain: Not on file  Food Insecurity: Not on file  Transportation Needs: Not on file  Physical Activity: Not on file  Stress: Not on file  Social Connections: Not on file  Intimate Partner Violence: Not on file     PHYSICAL EXAM  Vitals:   06/03/22 0930  BP: (!)  113/54  Pulse: 61  Weight: 132 lb 3.2 oz (60 kg)  Height: 5' (1.524 m)    Body mass index is 25.82 kg/m.   General: The patient is well-developed and well-nourished and in no acute distress   Skin: Extremities are without rash or edema.   Neurologic Exam  Mental status: The patient is alert and oriented x 3 at the time of the examination. The patient has apparent normal recent and remote memory, with an apparently normal attention span and concentration ability.   Speech is normal.  Cranial nerves: Extraocular movements are full.   Facial symmetry is present. .Facial strength is normal.  Trapezius and sternocleidomastoid strength is normal. No dysarthria is noted.  No obvious hearing deficits are noted.  Motor:  Muscle bulk is normal.   Mildly increased muscle tone in the left leg.  Strength was 4 -/5 in the iliopsoas muscle and 4/5 elsewhere in left leg.  Strength is 4+/5 in the right leg.  Strength was 5/5 in the right arm strength is 5/5 in arms   Sensory: She now reports normal sensation to touch and temperature as well as vibration sensation.  Coordination: Cerebellar testing reveals good  right finger-nose-finger but she is unable to do left finger-nose-finger.  Reduced heel-to-shin, worse on the left.  Gait and station: Stands up using arms to help.  She is able to walk within the room with unilateral support.  She has a positive Romberg   Reflexes: Deep tendon reflexes are increased in the left leg relative to the right.  Spread at the knees.  1 beat clonus left ankle    DIAGNOSTIC DATA (LABS, IMAGING, TESTING) - I reviewed patient records, labs, notes, testing and imaging myself where available.  Lab Results  Component Value Date   WBC 6.1 11/28/2021   HGB 12.7 11/28/2021   HCT 38.0 11/28/2021   MCV 91.8 11/28/2021   PLT 142 (L) 11/28/2021      Component Value Date/Time   NA 135 11/28/2021 1743   NA 138 05/16/2021 1631   K 3.8 11/28/2021 1743   CL 101  11/28/2021 1743   CO2 26 11/28/2021 1743   GLUCOSE 92 11/28/2021 1743   BUN 8 11/28/2021 1743   BUN 8 05/16/2021 1631   CREATININE 0.88 11/28/2021 1743   CALCIUM 8.7 (L) 11/28/2021 1743   PROT 7.5 11/28/2021 1743   PROT 7.3 05/16/2021 1631   ALBUMIN 4.0 11/28/2021 1743   ALBUMIN 4.1 05/16/2021 1631   AST 16 11/28/2021 1743   ALT 10 11/28/2021 1743   ALKPHOS 73 11/28/2021 1743   BILITOT 0.6 11/28/2021 1743   BILITOT 0.3 05/16/2021 1631   GFRNONAA >60 11/28/2021 1743   GFRAA 92 12/20/2019 1558    No results found for: "HGBA1C" Lab Results  Component Value Date   VITAMINB12 1,457 (H) 08/01/2018   _____________________  Neuromyelitis optica (devic) ([place]) - Plan: CBC with Differential/Platelet, Comprehensive metabolic panel, Neuromyelitis optica autoab, IgG  High risk medication use  Left hemiplegia (HCC)  Neurogenic bladder   1.   Continue Soliris for neuromyelitis optica.  Check labs today.  Is good with vaccinations until 2026.  2.  Check lab work today.   3.  She has moderately severe spinal stenosis at L4-L5, moderate spinal stenosis at L3-L4 mild spinal stenosis at L5-S1. If symptoms worsen again I would recommend that she see neurosurgery. 4   Continue baclofen for spasticity. 4.   Continue Lyrica for her postherpetic neuralgia (V1 distribution)  -- pain is much milder 5.   she will return to see me in 6 months or sooner if there are new or worsening neurologic symptoms.     Axell Trigueros A. Felecia Shelling, MD, [facility] [id number], [id number] AM Certified in Neurology, Clinical Neurophysiology, Sleep Medicine, Pain Medicine and Neuroimaging  [facility] [address], [place], [place] [address] [phone]

## 2022-06-09 ENCOUNTER — Ambulatory Visit: Payer: Medicare HMO | Admitting: Neurology

## 2022-06-10 DIAGNOSIS — G360 Neuromyelitis optica [Devic]: Secondary | ICD-10-CM | POA: Diagnosis not present

## 2022-06-10 DIAGNOSIS — G0491 Myelitis, unspecified: Secondary | ICD-10-CM | POA: Diagnosis not present

## 2022-06-10 LAB — CBC WITH DIFFERENTIAL/PLATELET
Basophils Absolute: 0 10*3/uL (ref 0.0–0.2)
Basos: 1 %
EOS (ABSOLUTE): 0.1 10*3/uL (ref 0.0–0.4)
Eos: 2 %
Hematocrit: 38.4 % (ref 34.0–46.6)
Hemoglobin: 12.9 g/dL (ref 11.1–15.9)
Immature Grans (Abs): 0 10*3/uL (ref 0.0–0.1)
Immature Granulocytes: 0 %
Lymphocytes Absolute: 1.5 10*3/uL (ref 0.7–3.1)
Lymphs: 34 %
MCH: 29.9 pg (ref 26.6–33.0)
MCHC: 33.6 g/dL (ref 31.5–35.7)
MCV: 89 fL (ref 79–97)
Monocytes Absolute: 0.4 10*3/uL (ref 0.1–0.9)
Monocytes: 10 %
Neutrophils Absolute: 2.3 10*3/uL (ref 1.4–7.0)
Neutrophils: 53 %
Platelets: 124 10*3/uL — ABNORMAL LOW (ref 150–450)
RBC: 4.31 x10E6/uL (ref 3.77–5.28)
RDW: 13.1 % (ref 11.7–15.4)
WBC: 4.3 10*3/uL (ref 3.4–10.8)

## 2022-06-10 LAB — COMPREHENSIVE METABOLIC PANEL
ALT: 7 IU/L (ref 0–32)
AST: 13 IU/L (ref 0–40)
Albumin/Globulin Ratio: 1.1 — ABNORMAL LOW (ref 1.2–2.2)
Albumin: 4 g/dL (ref 3.8–4.8)
Alkaline Phosphatase: 101 IU/L (ref 44–121)
BUN/Creatinine Ratio: 12 (ref 12–28)
BUN: 11 mg/dL (ref 8–27)
Bilirubin Total: 0.2 mg/dL (ref 0.0–1.2)
CO2: 24 mmol/L (ref 20–29)
Calcium: 9.1 mg/dL (ref 8.7–10.3)
Chloride: 102 mmol/L (ref 96–106)
Creatinine, Ser: 0.94 mg/dL (ref 0.57–1.00)
Globulin, Total: 3.5 g/dL (ref 1.5–4.5)
Glucose: 84 mg/dL (ref 70–99)
Potassium: 4.4 mmol/L (ref 3.5–5.2)
Sodium: 138 mmol/L (ref 134–144)
Total Protein: 7.5 g/dL (ref 6.0–8.5)
eGFR: 65 mL/min/{1.73_m2} (ref >59)

## 2022-06-10 LAB — NEUROMYELITIS OPTICA AUTOAB, IGG: NMO IgG Autoantibodies: 292.3 U/mL — ABNORMAL HIGH (ref 0.0–3.0)

## 2022-06-18 ENCOUNTER — Telehealth: Payer: Self-pay | Admitting: Neurology

## 2022-06-18 NOTE — Telephone Encounter (Signed)
Cassie @ [facility]  is asking if pt is still on Solaris Treatment, a vm can be left if she is not available [phone]

## 2022-06-18 NOTE — Telephone Encounter (Addendum)
I called Cassie back and left detailed message on voicemail that patient still gets her Solaris treatment done her at [facility].    Dr. Felecia Shelling pt gets Solaris treatments done at here at [facility], can add addendum to your note on 06/03/22 stating patient has infusions done here now? It was noted infusions are done at home.

## 2022-06-20 DIAGNOSIS — R69 Illness, unspecified: Secondary | ICD-10-CM | POA: Diagnosis not present

## 2022-06-20 DIAGNOSIS — H6691 Otitis media, unspecified, right ear: Secondary | ICD-10-CM | POA: Diagnosis not present

## 2022-06-20 DIAGNOSIS — M329 Systemic lupus erythematosus, unspecified: Secondary | ICD-10-CM | POA: Diagnosis not present

## 2022-06-20 DIAGNOSIS — Z299 Encounter for prophylactic measures, unspecified: Secondary | ICD-10-CM | POA: Diagnosis not present

## 2022-06-20 DIAGNOSIS — F17210 Nicotine dependence, cigarettes, uncomplicated: Secondary | ICD-10-CM | POA: Diagnosis not present

## 2022-06-20 DIAGNOSIS — H6121 Impacted cerumen, right ear: Secondary | ICD-10-CM | POA: Diagnosis not present

## 2022-06-20 DIAGNOSIS — R110 Nausea: Secondary | ICD-10-CM | POA: Diagnosis not present

## 2022-06-24 DIAGNOSIS — G360 Neuromyelitis optica [Devic]: Secondary | ICD-10-CM | POA: Diagnosis not present

## 2022-06-24 DIAGNOSIS — G0491 Myelitis, unspecified: Secondary | ICD-10-CM | POA: Diagnosis not present

## 2022-06-30 ENCOUNTER — Telehealth: Payer: Self-pay | Admitting: *Deleted

## 2022-06-30 NOTE — Telephone Encounter (Signed)
Faxed referral to [place] for pt Soliris infusion to potentially be done at home. They will do benefit investigation first to determine if she is eligible. Fax: [phone]. Received fax confirmation.

## 2022-07-01 ENCOUNTER — Telehealth: Payer: Self-pay | Admitting: Neurology

## 2022-07-01 NOTE — Telephone Encounter (Signed)
It appears patient received a letter from the breast center letting her know it time to schedule her mammogram. I informed pt that is what the my chart notification is about. Pt verbalized she understood.

## 2022-07-01 NOTE — Telephone Encounter (Signed)
Pt states she is unable to access/understand my chart.  Pt states there was a notification about a letter.  Pt is asking if RN can call and explain what letter is being sent to her

## 2022-07-08 DIAGNOSIS — G0491 Myelitis, unspecified: Secondary | ICD-10-CM | POA: Diagnosis not present

## 2022-07-08 DIAGNOSIS — G360 Neuromyelitis optica [Devic]: Secondary | ICD-10-CM | POA: Diagnosis not present

## 2022-07-14 DIAGNOSIS — Z1331 Encounter for screening for depression: Secondary | ICD-10-CM | POA: Diagnosis not present

## 2022-07-14 DIAGNOSIS — Z1339 Encounter for screening examination for other mental health and behavioral disorders: Secondary | ICD-10-CM | POA: Diagnosis not present

## 2022-07-14 DIAGNOSIS — G8190 Hemiplegia, unspecified affecting unspecified side: Secondary | ICD-10-CM | POA: Diagnosis not present

## 2022-07-14 DIAGNOSIS — I700 Atherosclerosis of aorta: Secondary | ICD-10-CM | POA: Diagnosis not present

## 2022-07-14 DIAGNOSIS — Z7189 Other specified counseling: Secondary | ICD-10-CM | POA: Diagnosis not present

## 2022-07-14 DIAGNOSIS — Z299 Encounter for prophylactic measures, unspecified: Secondary | ICD-10-CM | POA: Diagnosis not present

## 2022-07-14 DIAGNOSIS — M329 Systemic lupus erythematosus, unspecified: Secondary | ICD-10-CM | POA: Diagnosis not present

## 2022-07-14 DIAGNOSIS — Z0000 Encounter for general adult medical examination without abnormal findings: Secondary | ICD-10-CM | POA: Diagnosis not present

## 2022-07-14 DIAGNOSIS — Z6822 Body mass index (BMI) 22.0-22.9, adult: Secondary | ICD-10-CM | POA: Diagnosis not present

## 2022-07-15 DIAGNOSIS — Z79891 Long term (current) use of opiate analgesic: Secondary | ICD-10-CM | POA: Diagnosis not present

## 2022-07-15 DIAGNOSIS — G894 Chronic pain syndrome: Secondary | ICD-10-CM | POA: Diagnosis not present

## 2022-07-15 DIAGNOSIS — Z79899 Other long term (current) drug therapy: Secondary | ICD-10-CM | POA: Diagnosis not present

## 2022-07-15 NOTE — Telephone Encounter (Signed)
Received update from [facility]  Phone: [phone] "I'm happy to inform you that we've received confirmation of our in network status on both the [place] and medical side for the patient Susan Hodge). The patient has been assigned a low copay. Additionally, we have a grant that could potentially further reduce the copay."  Gave them permission to call pt to coordinate setting up infusion at the home.  Per Kim,RN/[facility], she will get next infusion with them on 07/22/22 since they have her drug on hand already. She will then start with [facility] 08/05/22 moving forward.

## 2022-07-16 ENCOUNTER — Telehealth: Payer: Self-pay | Admitting: *Deleted

## 2022-07-16 NOTE — Patient Outreach (Signed)
  Care Coordination   07/16/2022 Name: NAYLEE FRANKOWSKI MRN: [id number] DOB: Mar 26, 1951   Care Coordination Outreach Attempts:  An unsuccessful telephone outreach was attempted today to offer the patient information about available care coordination services as a benefit of their health plan.   Follow Up Plan:  Additional outreach attempts will be made to offer the patient care coordination information and services.   Encounter Outcome:  No Answer   Care Coordination Interventions:  No, not indicated    [facility] [phone]

## 2022-07-22 DIAGNOSIS — G0491 Myelitis, unspecified: Secondary | ICD-10-CM | POA: Diagnosis not present

## 2022-07-22 DIAGNOSIS — G360 Neuromyelitis optica [Devic]: Secondary | ICD-10-CM | POA: Diagnosis not present

## 2022-07-30 DIAGNOSIS — G360 Neuromyelitis optica [Devic]: Secondary | ICD-10-CM | POA: Diagnosis not present

## 2022-08-04 ENCOUNTER — Telehealth: Payer: Self-pay

## 2022-08-04 NOTE — Patient Outreach (Signed)
  Care Coordination   08/04/2022 Name: Susan Hodge MRN: [id number] DOB: 03-27-1951   Care Coordination Outreach Attempts:  A second unsuccessful outreach was attempted today to offer the patient with information about available care coordination services as a benefit of their health plan.     Follow Up Plan:  Additional outreach attempts will be made to offer the patient care coordination information and services.   Encounter Outcome:  No Answer   Care Coordination Interventions:  No, not indicated    Tomasa Rand, RN, BSN, [facility] [employer] [phone]

## 2022-08-04 NOTE — Patient Outreach (Signed)
  Care Coordination   Initial Visit Note   08/04/2022 Name: Susan Hodge MRN: [id number] DOB: 1951-04-29  Susan Hodge is a 72 y.o. year old female who sees Monico Blitz, MD for primary care. I spoke with  [name] by phone today.  What matters to the patients health and wellness today?  Incoming call back from patient, I reviewed and offered [facility] care coordination program. Patient reports that she is doing well and denies any needs today.    SDOH assessments and interventions completed:  No     Care Coordination Interventions:  No, not indicated   Follow up plan: No further intervention required.   Encounter Outcome:  Pt. Refused   Tomasa Rand, RN, BSN, CEN [facility] [employer] [phone]

## 2022-08-05 DIAGNOSIS — G360 Neuromyelitis optica [Devic]: Secondary | ICD-10-CM | POA: Diagnosis not present

## 2022-08-12 DIAGNOSIS — M329 Systemic lupus erythematosus, unspecified: Secondary | ICD-10-CM | POA: Diagnosis not present

## 2022-08-12 DIAGNOSIS — M549 Dorsalgia, unspecified: Secondary | ICD-10-CM | POA: Diagnosis not present

## 2022-08-12 DIAGNOSIS — M25552 Pain in left hip: Secondary | ICD-10-CM | POA: Diagnosis not present

## 2022-08-12 DIAGNOSIS — G43809 Other migraine, not intractable, without status migrainosus: Secondary | ICD-10-CM | POA: Diagnosis not present

## 2022-08-12 DIAGNOSIS — Z79891 Long term (current) use of opiate analgesic: Secondary | ICD-10-CM | POA: Diagnosis not present

## 2022-08-12 DIAGNOSIS — M25551 Pain in right hip: Secondary | ICD-10-CM | POA: Diagnosis not present

## 2022-08-12 DIAGNOSIS — M25512 Pain in left shoulder: Secondary | ICD-10-CM | POA: Diagnosis not present

## 2022-08-12 DIAGNOSIS — M542 Cervicalgia: Secondary | ICD-10-CM | POA: Diagnosis not present

## 2022-08-12 DIAGNOSIS — M25511 Pain in right shoulder: Secondary | ICD-10-CM | POA: Diagnosis not present

## 2022-08-12 DIAGNOSIS — G360 Neuromyelitis optica [Devic]: Secondary | ICD-10-CM | POA: Diagnosis not present

## 2022-08-12 DIAGNOSIS — G894 Chronic pain syndrome: Secondary | ICD-10-CM | POA: Diagnosis not present

## 2022-08-26 ENCOUNTER — Ambulatory Visit: Payer: Medicare HMO | Admitting: Neurology

## 2022-09-02 DIAGNOSIS — G360 Neuromyelitis optica [Devic]: Secondary | ICD-10-CM | POA: Diagnosis not present

## 2022-09-03 DIAGNOSIS — G360 Neuromyelitis optica [Devic]: Secondary | ICD-10-CM | POA: Diagnosis not present

## 2022-09-09 DIAGNOSIS — R569 Unspecified convulsions: Secondary | ICD-10-CM | POA: Diagnosis not present

## 2022-09-09 DIAGNOSIS — Z299 Encounter for prophylactic measures, unspecified: Secondary | ICD-10-CM | POA: Diagnosis not present

## 2022-09-09 DIAGNOSIS — R609 Edema, unspecified: Secondary | ICD-10-CM | POA: Diagnosis not present

## 2022-09-09 DIAGNOSIS — L299 Pruritus, unspecified: Secondary | ICD-10-CM | POA: Diagnosis not present

## 2022-09-09 DIAGNOSIS — R2243 Localized swelling, mass and lump, lower limb, bilateral: Secondary | ICD-10-CM | POA: Diagnosis not present

## 2022-09-09 DIAGNOSIS — M329 Systemic lupus erythematosus, unspecified: Secondary | ICD-10-CM | POA: Diagnosis not present

## 2022-09-16 DIAGNOSIS — M329 Systemic lupus erythematosus, unspecified: Secondary | ICD-10-CM | POA: Diagnosis not present

## 2022-09-16 DIAGNOSIS — Z79899 Other long term (current) drug therapy: Secondary | ICD-10-CM | POA: Diagnosis not present

## 2022-09-16 DIAGNOSIS — G894 Chronic pain syndrome: Secondary | ICD-10-CM | POA: Diagnosis not present

## 2022-09-16 DIAGNOSIS — M25511 Pain in right shoulder: Secondary | ICD-10-CM | POA: Diagnosis not present

## 2022-09-16 DIAGNOSIS — G43809 Other migraine, not intractable, without status migrainosus: Secondary | ICD-10-CM | POA: Diagnosis not present

## 2022-09-16 DIAGNOSIS — Z79891 Long term (current) use of opiate analgesic: Secondary | ICD-10-CM | POA: Diagnosis not present

## 2022-09-16 DIAGNOSIS — M25552 Pain in left hip: Secondary | ICD-10-CM | POA: Diagnosis not present

## 2022-09-16 DIAGNOSIS — M25551 Pain in right hip: Secondary | ICD-10-CM | POA: Diagnosis not present

## 2022-09-16 DIAGNOSIS — M549 Dorsalgia, unspecified: Secondary | ICD-10-CM | POA: Diagnosis not present

## 2022-09-16 DIAGNOSIS — G360 Neuromyelitis optica [Devic]: Secondary | ICD-10-CM | POA: Diagnosis not present

## 2022-09-16 DIAGNOSIS — M542 Cervicalgia: Secondary | ICD-10-CM | POA: Diagnosis not present

## 2022-09-16 DIAGNOSIS — M25512 Pain in left shoulder: Secondary | ICD-10-CM | POA: Diagnosis not present

## 2022-09-18 ENCOUNTER — Telehealth: Payer: Self-pay | Admitting: Neurology

## 2022-09-22 NOTE — Telephone Encounter (Signed)
Error

## 2022-09-23 DIAGNOSIS — G360 Neuromyelitis optica [Devic]: Secondary | ICD-10-CM | POA: Diagnosis not present

## 2022-09-24 DIAGNOSIS — G360 Neuromyelitis optica [Devic]: Secondary | ICD-10-CM | POA: Diagnosis not present

## 2022-10-01 DIAGNOSIS — G360 Neuromyelitis optica [Devic]: Secondary | ICD-10-CM | POA: Diagnosis not present

## 2022-10-07 DIAGNOSIS — F419 Anxiety disorder, unspecified: Secondary | ICD-10-CM | POA: Diagnosis not present

## 2022-10-07 DIAGNOSIS — M329 Systemic lupus erythematosus, unspecified: Secondary | ICD-10-CM | POA: Diagnosis not present

## 2022-10-07 DIAGNOSIS — G8190 Hemiplegia, unspecified affecting unspecified side: Secondary | ICD-10-CM | POA: Diagnosis not present

## 2022-10-07 DIAGNOSIS — R5383 Other fatigue: Secondary | ICD-10-CM | POA: Diagnosis not present

## 2022-10-08 ENCOUNTER — Encounter: Payer: Self-pay | Admitting: Neurology

## 2022-10-14 DIAGNOSIS — Z79891 Long term (current) use of opiate analgesic: Secondary | ICD-10-CM | POA: Diagnosis not present

## 2022-10-14 DIAGNOSIS — G360 Neuromyelitis optica [Devic]: Secondary | ICD-10-CM | POA: Diagnosis not present

## 2022-10-14 DIAGNOSIS — M25511 Pain in right shoulder: Secondary | ICD-10-CM | POA: Diagnosis not present

## 2022-10-14 DIAGNOSIS — M25552 Pain in left hip: Secondary | ICD-10-CM | POA: Diagnosis not present

## 2022-10-14 DIAGNOSIS — G43809 Other migraine, not intractable, without status migrainosus: Secondary | ICD-10-CM | POA: Diagnosis not present

## 2022-10-14 DIAGNOSIS — M549 Dorsalgia, unspecified: Secondary | ICD-10-CM | POA: Diagnosis not present

## 2022-10-14 DIAGNOSIS — M542 Cervicalgia: Secondary | ICD-10-CM | POA: Diagnosis not present

## 2022-10-14 DIAGNOSIS — M25512 Pain in left shoulder: Secondary | ICD-10-CM | POA: Diagnosis not present

## 2022-10-14 DIAGNOSIS — M25551 Pain in right hip: Secondary | ICD-10-CM | POA: Diagnosis not present

## 2022-10-14 DIAGNOSIS — G894 Chronic pain syndrome: Secondary | ICD-10-CM | POA: Diagnosis not present

## 2022-10-14 DIAGNOSIS — M329 Systemic lupus erythematosus, unspecified: Secondary | ICD-10-CM | POA: Diagnosis not present

## 2022-10-21 DIAGNOSIS — G360 Neuromyelitis optica [Devic]: Secondary | ICD-10-CM | POA: Diagnosis not present

## 2022-10-22 DIAGNOSIS — G360 Neuromyelitis optica [Devic]: Secondary | ICD-10-CM | POA: Diagnosis not present

## 2022-10-24 ENCOUNTER — Encounter: Payer: Self-pay | Admitting: Neurology

## 2022-11-11 DIAGNOSIS — G360 Neuromyelitis optica [Devic]: Secondary | ICD-10-CM | POA: Diagnosis not present

## 2022-11-11 DIAGNOSIS — G894 Chronic pain syndrome: Secondary | ICD-10-CM | POA: Diagnosis not present

## 2022-11-11 DIAGNOSIS — M25552 Pain in left hip: Secondary | ICD-10-CM | POA: Diagnosis not present

## 2022-11-11 DIAGNOSIS — Z79899 Other long term (current) drug therapy: Secondary | ICD-10-CM | POA: Diagnosis not present

## 2022-11-11 DIAGNOSIS — G43809 Other migraine, not intractable, without status migrainosus: Secondary | ICD-10-CM | POA: Diagnosis not present

## 2022-11-11 DIAGNOSIS — M25511 Pain in right shoulder: Secondary | ICD-10-CM | POA: Diagnosis not present

## 2022-11-11 DIAGNOSIS — M25512 Pain in left shoulder: Secondary | ICD-10-CM | POA: Diagnosis not present

## 2022-11-11 DIAGNOSIS — M329 Systemic lupus erythematosus, unspecified: Secondary | ICD-10-CM | POA: Diagnosis not present

## 2022-11-11 DIAGNOSIS — Z79891 Long term (current) use of opiate analgesic: Secondary | ICD-10-CM | POA: Diagnosis not present

## 2022-11-11 DIAGNOSIS — M25551 Pain in right hip: Secondary | ICD-10-CM | POA: Diagnosis not present

## 2022-11-11 DIAGNOSIS — M542 Cervicalgia: Secondary | ICD-10-CM | POA: Diagnosis not present

## 2022-11-19 DIAGNOSIS — G360 Neuromyelitis optica [Devic]: Secondary | ICD-10-CM | POA: Diagnosis not present

## 2022-11-20 DIAGNOSIS — G360 Neuromyelitis optica [Devic]: Secondary | ICD-10-CM | POA: Diagnosis not present

## 2022-11-27 DIAGNOSIS — G360 Neuromyelitis optica [Devic]: Secondary | ICD-10-CM | POA: Diagnosis not present

## 2022-12-02 ENCOUNTER — Encounter: Payer: Self-pay | Admitting: Neurology

## 2022-12-02 ENCOUNTER — Ambulatory Visit (INDEPENDENT_AMBULATORY_CARE_PROVIDER_SITE_OTHER): Payer: Medicare HMO | Admitting: Neurology

## 2022-12-02 VITALS — BP 109/62 | HR 63 | Ht 60.0 in | Wt 116.3 lb

## 2022-12-02 DIAGNOSIS — B0229 Other postherpetic nervous system involvement: Secondary | ICD-10-CM | POA: Insufficient documentation

## 2022-12-02 DIAGNOSIS — G8194 Hemiplegia, unspecified affecting left nondominant side: Secondary | ICD-10-CM

## 2022-12-02 DIAGNOSIS — N319 Neuromuscular dysfunction of bladder, unspecified: Secondary | ICD-10-CM | POA: Diagnosis not present

## 2022-12-02 DIAGNOSIS — G360 Neuromyelitis optica [Devic]: Secondary | ICD-10-CM | POA: Diagnosis not present

## 2022-12-02 DIAGNOSIS — Z79899 Other long term (current) drug therapy: Secondary | ICD-10-CM

## 2022-12-02 MED ORDER — BACLOFEN 10 MG PO TABS: ORAL_TABLET | ORAL | 11 refills | Status: AC

## 2022-12-02 NOTE — Progress Notes (Signed)
[facility]  PATIENT: Susan Hodge DOB: [birth year]  REFERRING DOCTOR OR PCP:  Salley Slaughter, NP SOURCE: Patient, daughter, notes from hospital and rehab admissions, multiple imaging and lab reports, MRI images of brain, cervical, thoracic and lumbar spine were reviewed  _________________________________   HISTORICAL  CHIEF COMPLAINT:  Chief Complaint  Patient presents with   Follow-up    Pt in room 10, son in room. Here for Neuromyelitis optica follow up. Soliris infusions done at home. No recent falls.   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  We discussed Ultomiris as a possibility if the q2 week becomes difficult.  She is doing home health so is fine just staying on Soliris.     She tolerates it well.  She has not had any relapses since starting.  This compares to several relapses in rapid succession earlier in 2020.  She is able to walk 200 feet with a walker. However, she tries not to walk too far without a rest.      She has weakness and spasticity, left > right.     Left leg spasticity at times.    She notes some shaking in the left side of her body at times  She has left leg tingling.    Spasms also involve the trunk.    They are much better than a couple years ago and w just takes baclofen prn.    Vision is fine.    She has urinary urgency, helped by Enablex.   She also has back pain and leg pain form the knees down. .  This is worse with gait.  An ESI made her feel worse so she has no interest in doing again.     She has had issues tolerating steroids.     She is on oxycodoe and sees Pain Management.     She has chronic migraines.  She has some HA every day but intensity is better.  She takes Fioricet as needed.    She is on Valproic acid for the migraine and h/o several seizures about 7-8 year ago.   Emgality was not helpful so she stopped  She had shingles and has itching in her right forehead and near her ear in  2020   The distribution was V1 and she had corneal issues.  Eye sight is now back to normal.     She is on Lyrica 50 mg po tid.     She did the primary vaccinations initially and the Men B booster lin 2023.    Neuromyelitis optica history: She began to experience clumsiness in mid March 2020.  She had been placed on topiramate for migraines and had recently and had some dizziness and vertigo initially.  She fell and a day later began to have urinary incontinence and inability to move the left leg.   She went to the ED at [facility] and an MRI of the lumbar spine shoowed degenerative disc changes.    Of note, she had shingles a few days before the NMO symptoms.   She was prescribed a steroid pack and discharged.   The next day she was worse and her PCP sent her to the [facility] ED.   She had MRI of the brain and cervical and thoracic spine.   The MRI of the brain showed a remote right MCA stroke (she never had symptoms).   MRI of the spine showed multifocal lesions within the spinal  cord, centrally but asymmetric.      She received 5 days of IV Solu-Medrol in the hospital.   She initially improved after the steroids and was using a walker well a couple weeks later at discharge from rehab.  Balance was still poor but strength was good.   Of note she had no arm weakness in March or April.   Over the past couple days, she has felt much weaker with more left leg > arm weakness.  .  She also has had right flank numbness and a dysesthetic sensation (pressure like pain) since yesterday.      Bladder impoved with steroids and is continuing to do well (she is on Enablex).     She had the meningitis vaccination (both the [facility] and the B shots) and started [facility] June 2020.    MRIs The MRI of the brain 07/31/2018 shows a remote right MCA infarction with a small amount of associated hemosiderin.  There are also some scattered T2/flair hyperintense foci consistent with mild chronic microvascular ischemic changes.   There were no acute findings.  The MRI of the cervical spine shows T2 hyperintense signal within the spinal cord posteriorly to the right adjacent to C2, posterior to the left adjacent to C3, and a little more to the right but central adjacent to C7.  The foci adjacent to C3 and C7 does have subtle enhancement after gadolinium was infused.  There are some degenerative changes at C4-C5, C5-C6 and C6-C7 but no definite nerve root compression and no spinal stenosis.   MRI of the thoracic spine shows diffuse patchy T2 hyperintensity that is predominantly central.   Number spine shows the abnormal signal within the spinal cord adjacent to T10 and T11.  There are degenerative changes at L3-L4, L4-L5 and L5-S1.  There is spinal stenosis at L4-L5.  With possibility for left L4 and L5 nerve root  Labs: Neuromyelitis optica IgG antibody was positive at 139 (< 3 normal) at diagnosis.  Lumbar puncture showed a small elevation of white blood cell count, predominantly lymphocytes and macrophages.  B12, copper were normal.  Herpes simplex virus and HIV were negative.  ACE was normal.  REVIEW OF SYSTEMS: Constitutional: No fevers, chills, sweats, or change in appetite Eyes: No visual changes, double vision, eye pain Ear, nose and throat: No hearing loss, ear pain, nasal congestion, sore throat Cardiovascular: No chest pain, palpitations Respiratory:  No shortness of breath at rest or with exertion.   No wheezes GastrointestinaI: No nausea, vomiting, diarrhea, abdominal pain, fecal incontinence Genitourinary: As above Musculoskeletal:  No neck pain, back pain Integumentary: No rash, pruritus, skin lesions Neurological: as above Psychiatric: No depression at this time.  No anxiety Endocrine: No palpitations, diaphoresis, change in appetite, change in weigh or increased thirst Hematologic/Lymphatic:  No anemia, purpura, petechiae. Allergic/Immunologic: No itchy/runny eyes, nasal congestion, recent allergic  reactions, rashes  ALLERGIES: Allergies  Allergen Reactions   Prednisone Other (See Comments)    Not to be given at any point while patient is still taking Soliris infusions.  Severe shaking and dehydration.   Amitriptyline Other (See Comments)   Bee Venom Other (See Comments)   Cymbalta [Duloxetine Hcl] Nausea Only    Constipation    Fexofenadine Other (See Comments)   Furosemide Other (See Comments)   Gabapentin Other (See Comments)   Hydroxychloroquine Sulfate Other (See Comments)   Morphine Other (See Comments)   Nsaids    Oxycodone Other (See Comments)    dizzy  Topiramate Other (See Comments)    HOME MEDICATIONS:  Current Outpatient Medications:    ALPRAZolam (XANAX) 0.5 MG tablet, Take 1 tablet (0.5 mg total) by mouth 3 (three) times daily as needed for anxiety or sleep., Disp: 10 tablet, Rfl: 0   amoxicillin (AMOXIL) 250 MG capsule, Take 250 mg by mouth 3 (three) times daily as needed (for ear infection/inflammation). , Disp: , Rfl:    Butalbital-APAP-Caffeine (FIORICET PO), Take by mouth., Disp: , Rfl:    Cyanocobalamin (VITAMIN B-12 PO), Take 2 tablets by mouth daily., Disp: , Rfl:    darifenacin (ENABLEX) 15 MG 24 hr tablet, TAKE 1 TABLET BY MOUTH DAILY, Disp: 30 tablet, Rfl: 5   DULoxetine (CYMBALTA) 20 MG capsule, duloxetine 20 mg capsule,delayed release  Take 1 capsule every day by oral route., Disp: , Rfl:    Eculizumab (SOLIRIS IV), Inject 1,200 mg into the vein every 14 (fourteen) days. , Disp: , Rfl:    lidocaine (LIDODERM) 5 %, Place 1 patch onto the skin every 12 (twelve) hours., Disp: , Rfl:    loratadine (CLARITIN) 10 MG tablet, Take 10 mg by mouth daily., Disp: , Rfl:    magnesium hydroxide (MILK OF MAGNESIA) 400 MG/5ML suspension, Take 30 mLs by mouth daily as needed for mild constipation., Disp: 355 mL, Rfl: 0   ondansetron (ZOFRAN) 4 MG tablet, Take 1 tablet (4 mg total) by mouth every 6 (six) hours as needed for nausea., Disp: 20 tablet, Rfl: 0    oxyCODONE-acetaminophen (PERCOCET) 10-325 MG tablet, Take 1 tablet by mouth every 6 (six) hours as needed., Disp: 10 tablet, Rfl: 0   pantoprazole (PROTONIX) 40 MG tablet, Take 1 tablet (40 mg total) by mouth 2 (two) times daily before a meal., Disp: 60 tablet, Rfl: 1   potassium chloride SA (KLOR-CON) 20 MEQ tablet, Take 20 mEq by mouth 2 (two) times daily., Disp: , Rfl:    pregabalin (LYRICA) 50 MG capsule, Take 50 mg by mouth 3 (three) times daily., Disp: , Rfl:    UNABLE TO FIND, Med Name: CBD OIL that she uses topically for pain., Disp: , Rfl:    UNABLE TO FIND, daily. Med Name: fiber gummies, Disp: , Rfl:    valproic acid (DEPAKENE) 250 MG capsule, Take 1 capsule (250 mg total) by mouth at bedtime., Disp: 30 capsule, Rfl: 1   vitamin E 1000 UNIT capsule, Take 1,000 Units by mouth daily., Disp: , Rfl:    acetaminophen (TYLENOL) 325 MG tablet, Take 2 tablets (650 mg total) by mouth every 6 (six) hours as needed for mild pain (or Fever >/= 101). (Patient not taking: Reported on 12/02/2022), Disp: , Rfl:    baclofen (LIORESAL) 10 MG tablet, Take one pill po tid, Disp: 90 tablet, Rfl: 11   polyethylene glycol (MIRALAX) 17 g packet, Take 17 g by mouth every morning. (Patient not taking: Reported on 12/02/2022), Disp: 30 each, Rfl: 3   polyethylene glycol (MIRALAX) 17 g packet, Take 17 g by mouth daily. (Patient not taking: Reported on 12/02/2022), Disp: 14 each, Rfl: 0   Vitamin D, Ergocalciferol, (DRISDOL) 1.25 MG (50000 UT) CAPS capsule, Take 1 capsule (50,000 Units total) by mouth every Monday. (Patient not taking: Reported on 12/02/2022), Disp: 4 capsule, Rfl: 0  PAST MEDICAL HISTORY: Past Medical History:  Diagnosis Date   Chest pain    Collagen vascular disease (HCC)    Complication of anesthesia    Blisters to mouth.   Depression    DJD (degenerative  joint disease)    FH: CVA (cerebrovascular accident)    father   Fibromyalgia    GERD (gastroesophageal reflux disease)    Hypothyroidism     Lupus (HCC)    Migraines    Palpitations    Status post cholecystectomy    appendectomy and bilaateral oophorectomy   Tobacco use     PAST SURGICAL HISTORY: Past Surgical History:  Procedure Laterality Date   BILATERAL OOPHORECTOMY     CARDIAC CATHETERIZATION     CHOLECYSTECTOMY     COLONOSCOPY WITH PROPOFOL N/A 01/03/2019   Procedure: COLONOSCOPY WITH PROPOFOL;  Surgeon: Malissa Hippo, MD;  Location: [facility];  Service: Endoscopy;  Laterality: N/A;   ESOPHAGOGASTRODUODENOSCOPY (EGD) WITH PROPOFOL N/A 01/02/2019   Procedure: ESOPHAGOGASTRODUODENOSCOPY (EGD) WITH PROPOFOL;  Surgeon: Malissa Hippo, MD;  Location: [facility];  Service: Endoscopy;  Laterality: N/A;   POLYPECTOMY  01/03/2019   Procedure: POLYPECTOMY;  Surgeon: Malissa Hippo, MD;  Location: [facility];  Service: Endoscopy;;    FAMILY HISTORY: Family History  Problem Relation Age of Onset   Stroke Father     SOCIAL HISTORY:  Social History   Socioeconomic History   Marital status: Single    Spouse name: Not on file   Number of children: 3   Years of education: Not on file   Highest education level: Not on file  Occupational History   Occupation: disability  Tobacco Use   Smoking status: Every Day    Current packs/day: 0.50    Types: Cigarettes   Smokeless tobacco: Never   Tobacco comments:    quit last week 01/03/19  Vaping Use   Vaping status: Never Used  Substance and Sexual Activity   Alcohol use: No   Drug use: Never   Sexual activity: Not on file  Other Topics Concern   Not on file  Social History Narrative   Friend lives with her   Caffeine use: coffee daily   Left handed    Social Determinants of Health   Financial Resource Strain: Not on file  Food Insecurity: Not on file  Transportation Needs: Not on file  Physical Activity: Not on file  Stress: Not on file  Social Connections: Not on file  Intimate Partner Violence: Not on file     PHYSICAL EXAM  Vitals:    12/02/22 1516  BP: 109/62  Pulse: 63  Weight: 116 lb 4.8 oz (52.8 kg)  Height: 5' (1.524 m)    Body mass index is 22.71 kg/m.   General: The patient is well-developed and well-nourished and in no acute distress   Skin: Extremities are without rash or edema.   Neurologic Exam  Mental status: The patient is alert and oriented x 3 at the time of the examination. The patient has apparent normal recent and remote memory, with an apparently normal attention span and concentration ability.   Speech is normal.  Cranial nerves: Extraocular movements are full.   Facial symmetry is present. .Facial strength is normal.  Trapezius and sternocleidomastoid strength is normal. No dysarthria is noted.  No obvious hearing deficits are noted.  Motor:  Muscle bulk is normal.   Mildly increased muscle tone in the left leg.  Strength was 4 -/5 in the iliopsoas muscle and 4/5 elsewhere in left leg.  Strength is 4+/5 in the right leg.  Strength was 5/5 in the right arm strength is 5/5 in arms   Sensory: She now reports normal sensation to touch and  temperature as well as vibration sensation.  Coordination: Cerebellar testing reveals good right finger-nose-finger but she is unable to do left finger-nose-finger.  Reduced heel-to-shin, worse on the left.  Gait and station: Stands up using arms to help.  She is able to walk within the room with unilateral support.  She cannot tandem.  She has a positive Romberg   Reflexes: Deep tendon reflexes are increased in the left leg relative to the right.  Spread at the knees.  1 beat clonus left ankle    DIAGNOSTIC DATA (LABS, IMAGING, TESTING) - I reviewed patient records, labs, notes, testing and imaging myself where available.  Lab Results  Component Value Date   WBC 4.3 06/03/2022   HGB 12.9 06/03/2022   HCT 38.4 06/03/2022   MCV 89 06/03/2022   PLT 124 (L) 06/03/2022      Component Value Date/Time   NA 138 06/03/2022 1021   K 4.4 06/03/2022 1021    CL 102 06/03/2022 1021   CO2 24 06/03/2022 1021   GLUCOSE 84 06/03/2022 1021   GLUCOSE 92 11/28/2021 1743   BUN 11 06/03/2022 1021   CREATININE 0.94 06/03/2022 1021   CALCIUM 9.1 06/03/2022 1021   PROT 7.5 06/03/2022 1021   ALBUMIN 4.0 06/03/2022 1021   AST 13 06/03/2022 1021   ALT 7 06/03/2022 1021   ALKPHOS 101 06/03/2022 1021   BILITOT 0.2 06/03/2022 1021   GFRNONAA >60 11/28/2021 1743   GFRAA 92 12/20/2019 1558    No results found for: "HGBA1C" Lab Results  Component Value Date   VITAMINB12 1,457 (H) 08/01/2018   _____________________  Neuromyelitis optica (devic) (HCC) - Plan: Neuromyelitis optica autoab, IgG, CBC with Differential/Platelet, Comprehensive metabolic panel  High risk medication use - Plan: Neuromyelitis optica autoab, IgG, CBC with Differential/Platelet, Comprehensive metabolic panel  Left hemiplegia (HCC)  Neurogenic bladder  Postherpetic neuralgia   1.   Continue Soliris for neuromyelitis optica. She is good with vaccinations until 2026.   If q2 weeks is difficult, we can switch to Ultomiris which has the same mechanism of action. 2.  Check lab work today.   3.  She has moderately severe spinal stenosis at L4-L5, moderate spinal stenosis at L3-L4 mild spinal stenosis at L5-S1. If symptoms worsen again I would recommend an ESI or that she see neurosurgery. 4   She stopped baclofen for spasticity. 4.   Continue Lyrica for her postherpetic neuralgia (V1 distribution)  -- pain is much better than a couple years ago.  Continue patches for lumbar pain.  5.   she will return to see me in 6 months or sooner if there are new or worsening neurologic symptoms.   41-minute office visit with the majority of the time spent face-to-face for history and physical, discussion/counseling and decision-making.  Additional time with record review and documentation.  This visit is part of a comprehensive longitudinal care medical relationship regarding the patients primary  diagnosis of neuromyelitis optica and related concerns.   Andera Cranmer A. Epimenio Foot, MD, [facility] 12/02/2022, 3:52 PM Certified in Neurology, Clinical Neurophysiology, Sleep Medicine, Pain Medicine and Neuroimaging  [facility] [address] [place], [place] [address] [phone]

## 2022-12-09 DIAGNOSIS — M25551 Pain in right hip: Secondary | ICD-10-CM | POA: Diagnosis not present

## 2022-12-09 DIAGNOSIS — M25552 Pain in left hip: Secondary | ICD-10-CM | POA: Diagnosis not present

## 2022-12-09 DIAGNOSIS — Z79891 Long term (current) use of opiate analgesic: Secondary | ICD-10-CM | POA: Diagnosis not present

## 2022-12-09 DIAGNOSIS — M542 Cervicalgia: Secondary | ICD-10-CM | POA: Diagnosis not present

## 2022-12-09 DIAGNOSIS — M25512 Pain in left shoulder: Secondary | ICD-10-CM | POA: Diagnosis not present

## 2022-12-09 DIAGNOSIS — G360 Neuromyelitis optica [Devic]: Secondary | ICD-10-CM | POA: Diagnosis not present

## 2022-12-09 DIAGNOSIS — M329 Systemic lupus erythematosus, unspecified: Secondary | ICD-10-CM | POA: Diagnosis not present

## 2022-12-09 DIAGNOSIS — M549 Dorsalgia, unspecified: Secondary | ICD-10-CM | POA: Diagnosis not present

## 2022-12-09 DIAGNOSIS — M25511 Pain in right shoulder: Secondary | ICD-10-CM | POA: Diagnosis not present

## 2022-12-09 DIAGNOSIS — G894 Chronic pain syndrome: Secondary | ICD-10-CM | POA: Diagnosis not present

## 2022-12-09 DIAGNOSIS — G43809 Other migraine, not intractable, without status migrainosus: Secondary | ICD-10-CM | POA: Diagnosis not present

## 2022-12-16 DIAGNOSIS — G360 Neuromyelitis optica [Devic]: Secondary | ICD-10-CM | POA: Diagnosis not present

## 2022-12-17 DIAGNOSIS — G360 Neuromyelitis optica [Devic]: Secondary | ICD-10-CM | POA: Diagnosis not present

## 2023-01-06 DIAGNOSIS — Z79899 Other long term (current) drug therapy: Secondary | ICD-10-CM | POA: Diagnosis not present

## 2023-01-06 DIAGNOSIS — G360 Neuromyelitis optica [Devic]: Secondary | ICD-10-CM | POA: Diagnosis not present

## 2023-01-06 DIAGNOSIS — M542 Cervicalgia: Secondary | ICD-10-CM | POA: Diagnosis not present

## 2023-01-06 DIAGNOSIS — M25512 Pain in left shoulder: Secondary | ICD-10-CM | POA: Diagnosis not present

## 2023-01-06 DIAGNOSIS — M25552 Pain in left hip: Secondary | ICD-10-CM | POA: Diagnosis not present

## 2023-01-06 DIAGNOSIS — Z79891 Long term (current) use of opiate analgesic: Secondary | ICD-10-CM | POA: Diagnosis not present

## 2023-01-06 DIAGNOSIS — M25551 Pain in right hip: Secondary | ICD-10-CM | POA: Diagnosis not present

## 2023-01-06 DIAGNOSIS — M25511 Pain in right shoulder: Secondary | ICD-10-CM | POA: Diagnosis not present

## 2023-01-06 DIAGNOSIS — G43809 Other migraine, not intractable, without status migrainosus: Secondary | ICD-10-CM | POA: Diagnosis not present

## 2023-01-06 DIAGNOSIS — G894 Chronic pain syndrome: Secondary | ICD-10-CM | POA: Diagnosis not present

## 2023-01-06 DIAGNOSIS — M329 Systemic lupus erythematosus, unspecified: Secondary | ICD-10-CM | POA: Diagnosis not present

## 2023-01-13 DIAGNOSIS — Z299 Encounter for prophylactic measures, unspecified: Secondary | ICD-10-CM | POA: Diagnosis not present

## 2023-01-13 DIAGNOSIS — M329 Systemic lupus erythematosus, unspecified: Secondary | ICD-10-CM | POA: Diagnosis not present

## 2023-01-13 DIAGNOSIS — J449 Chronic obstructive pulmonary disease, unspecified: Secondary | ICD-10-CM | POA: Diagnosis not present

## 2023-01-13 DIAGNOSIS — R8290 Unspecified abnormal findings in urine: Secondary | ICD-10-CM | POA: Diagnosis not present

## 2023-01-13 DIAGNOSIS — F419 Anxiety disorder, unspecified: Secondary | ICD-10-CM | POA: Diagnosis not present

## 2023-01-13 DIAGNOSIS — N390 Urinary tract infection, site not specified: Secondary | ICD-10-CM | POA: Diagnosis not present

## 2023-01-15 DIAGNOSIS — G360 Neuromyelitis optica [Devic]: Secondary | ICD-10-CM | POA: Diagnosis not present

## 2023-01-23 DIAGNOSIS — N390 Urinary tract infection, site not specified: Secondary | ICD-10-CM | POA: Diagnosis not present

## 2023-01-29 DIAGNOSIS — G360 Neuromyelitis optica [Devic]: Secondary | ICD-10-CM | POA: Diagnosis not present

## 2023-02-10 DIAGNOSIS — G43809 Other migraine, not intractable, without status migrainosus: Secondary | ICD-10-CM | POA: Diagnosis not present

## 2023-02-10 DIAGNOSIS — M25511 Pain in right shoulder: Secondary | ICD-10-CM | POA: Diagnosis not present

## 2023-02-10 DIAGNOSIS — M25512 Pain in left shoulder: Secondary | ICD-10-CM | POA: Diagnosis not present

## 2023-02-10 DIAGNOSIS — M25552 Pain in left hip: Secondary | ICD-10-CM | POA: Diagnosis not present

## 2023-02-10 DIAGNOSIS — M549 Dorsalgia, unspecified: Secondary | ICD-10-CM | POA: Diagnosis not present

## 2023-02-10 DIAGNOSIS — G360 Neuromyelitis optica [Devic]: Secondary | ICD-10-CM | POA: Diagnosis not present

## 2023-02-10 DIAGNOSIS — M25551 Pain in right hip: Secondary | ICD-10-CM | POA: Diagnosis not present

## 2023-02-10 DIAGNOSIS — M542 Cervicalgia: Secondary | ICD-10-CM | POA: Diagnosis not present

## 2023-02-10 DIAGNOSIS — G894 Chronic pain syndrome: Secondary | ICD-10-CM | POA: Diagnosis not present

## 2023-02-10 DIAGNOSIS — M329 Systemic lupus erythematosus, unspecified: Secondary | ICD-10-CM | POA: Diagnosis not present

## 2023-02-10 DIAGNOSIS — Z79891 Long term (current) use of opiate analgesic: Secondary | ICD-10-CM | POA: Diagnosis not present

## 2023-02-11 DIAGNOSIS — G360 Neuromyelitis optica [Devic]: Secondary | ICD-10-CM | POA: Diagnosis not present

## 2023-03-02 ENCOUNTER — Encounter: Payer: Self-pay | Admitting: Neurology

## 2023-03-10 DIAGNOSIS — M25552 Pain in left hip: Secondary | ICD-10-CM | POA: Diagnosis not present

## 2023-03-10 DIAGNOSIS — M25511 Pain in right shoulder: Secondary | ICD-10-CM | POA: Diagnosis not present

## 2023-03-10 DIAGNOSIS — M25512 Pain in left shoulder: Secondary | ICD-10-CM | POA: Diagnosis not present

## 2023-03-10 DIAGNOSIS — M542 Cervicalgia: Secondary | ICD-10-CM | POA: Diagnosis not present

## 2023-03-10 DIAGNOSIS — G894 Chronic pain syndrome: Secondary | ICD-10-CM | POA: Diagnosis not present

## 2023-03-10 DIAGNOSIS — Z79891 Long term (current) use of opiate analgesic: Secondary | ICD-10-CM | POA: Diagnosis not present

## 2023-03-10 DIAGNOSIS — G360 Neuromyelitis optica [Devic]: Secondary | ICD-10-CM | POA: Diagnosis not present

## 2023-03-10 DIAGNOSIS — M25551 Pain in right hip: Secondary | ICD-10-CM | POA: Diagnosis not present

## 2023-03-10 DIAGNOSIS — M549 Dorsalgia, unspecified: Secondary | ICD-10-CM | POA: Diagnosis not present

## 2023-03-10 DIAGNOSIS — G43809 Other migraine, not intractable, without status migrainosus: Secondary | ICD-10-CM | POA: Diagnosis not present

## 2023-03-10 DIAGNOSIS — M329 Systemic lupus erythematosus, unspecified: Secondary | ICD-10-CM | POA: Diagnosis not present

## 2023-03-20 DIAGNOSIS — G360 Neuromyelitis optica [Devic]: Secondary | ICD-10-CM | POA: Diagnosis not present

## 2023-03-24 DIAGNOSIS — G360 Neuromyelitis optica [Devic]: Secondary | ICD-10-CM | POA: Diagnosis not present

## 2023-03-30 DIAGNOSIS — G360 Neuromyelitis optica [Devic]: Secondary | ICD-10-CM | POA: Diagnosis not present

## 2023-04-13 DIAGNOSIS — G360 Neuromyelitis optica [Devic]: Secondary | ICD-10-CM | POA: Diagnosis not present

## 2023-04-23 ENCOUNTER — Encounter: Payer: Self-pay | Admitting: Neurology

## 2023-05-04 ENCOUNTER — Telehealth: Payer: Self-pay | Admitting: Neurology

## 2023-05-04 DIAGNOSIS — G360 Neuromyelitis optica [Devic]: Secondary | ICD-10-CM | POA: Diagnosis not present

## 2023-05-04 NOTE — Telephone Encounter (Signed)
Last office note faxed to [facility] pharmacy, confirmation received.

## 2023-05-04 NOTE — Telephone Encounter (Signed)
[facility] requesting recent clinical notes for the patient for   Eculizumab (SOLIRIS IV). Fax clinical notes to [phone]

## 2023-05-04 NOTE — Telephone Encounter (Signed)
Next appt is 06-11-2023 with Dr. Epimenio Foot.  Last seen 12-02-2022.  Faxed to soliris IV   [facility] [phone]. 11 pages.

## 2023-05-12 DIAGNOSIS — M25511 Pain in right shoulder: Secondary | ICD-10-CM | POA: Diagnosis not present

## 2023-05-12 DIAGNOSIS — G360 Neuromyelitis optica [Devic]: Secondary | ICD-10-CM | POA: Diagnosis not present

## 2023-05-12 DIAGNOSIS — M542 Cervicalgia: Secondary | ICD-10-CM | POA: Diagnosis not present

## 2023-05-12 DIAGNOSIS — Z79891 Long term (current) use of opiate analgesic: Secondary | ICD-10-CM | POA: Diagnosis not present

## 2023-05-12 DIAGNOSIS — G894 Chronic pain syndrome: Secondary | ICD-10-CM | POA: Diagnosis not present

## 2023-05-12 DIAGNOSIS — M25552 Pain in left hip: Secondary | ICD-10-CM | POA: Diagnosis not present

## 2023-05-12 DIAGNOSIS — M25551 Pain in right hip: Secondary | ICD-10-CM | POA: Diagnosis not present

## 2023-05-12 DIAGNOSIS — M25512 Pain in left shoulder: Secondary | ICD-10-CM | POA: Diagnosis not present

## 2023-05-22 DIAGNOSIS — G360 Neuromyelitis optica [Devic]: Secondary | ICD-10-CM | POA: Diagnosis not present

## 2023-06-11 ENCOUNTER — Ambulatory Visit: Payer: Medicare HMO | Admitting: Neurology

## 2023-06-11 ENCOUNTER — Encounter: Payer: Self-pay | Admitting: Neurology

## 2023-06-18 ENCOUNTER — Telehealth: Payer: Self-pay | Admitting: *Deleted

## 2023-06-18 NOTE — Telephone Encounter (Signed)
Marland Kitchen

## 2023-06-18 NOTE — Telephone Encounter (Signed)
Faxed signed order back to [facility] at [phone]. Received fax confirmation.

## 2023-06-18 NOTE — Telephone Encounter (Addendum)
Pt said need to discuss with the nurse about Soliris Solution. Need Dr. Epimenio Foot give permission telling insurance that got to have the medication. Would like call back, if do not answer due to hearing problem leave a voicemail.

## 2023-06-18 NOTE — Telephone Encounter (Signed)
Called pt. Relayed Soliris approved and approval letter sent to Gabri[facility].  Asked them to call her. She verbalized understanding and appreciation. She will also reach back out to [facility].  She was last seen 12/02/2022. Next appt scheduled for 12/31/2023 (she missed appt 06/11/23).   I scheduled appt for 06/24/23 at 2pm with Dr. Epimenio Foot since she is due for updated visit.

## 2023-06-19 DIAGNOSIS — G360 Neuromyelitis optica [Devic]: Secondary | ICD-10-CM | POA: Diagnosis not present

## 2023-06-23 ENCOUNTER — Telehealth: Payer: Self-pay | Admitting: Neurology

## 2023-06-23 NOTE — Telephone Encounter (Signed)
 [facility] conf

## 2023-06-24 ENCOUNTER — Telehealth: Payer: Medicare HMO | Admitting: Neurology

## 2023-06-24 NOTE — Progress Notes (Deleted)
 [facility]  PATIENT: Susan Hodge DOB: 14-Dec-1950  REFERRING DOCTOR OR PCP:  Salley Slaughter, NP SOURCE: Patient, daughter, notes from hospital and rehab admissions, multiple imaging and lab reports, MRI images of brain, cervical, thoracic and lumbar spine were reviewed  _________________________________   HISTORICAL  CHIEF COMPLAINT:  No chief complaint on file.  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  We discussed Ultomiris as a possibility if the q2 week becomes difficult.  She is doing home health so is fine just staying on Soliris.     She tolerates it well.  She has not had any relapses since starting.  This compares to several relapses in rapid succession earlier in 2020.  She is able to walk 200 feet with a walker. However, she tries not to walk too far without a rest.      She has weakness and spasticity, left > right.     Left leg spasticity at times.    She notes some shaking in the left side of her body at times  She has left leg tingling.    Spasms also involve the trunk.    They are much better than a couple years ago and w just takes baclofen prn.    Vision is fine.    She has urinary urgency, helped by Enablex.   She also has back pain and leg pain form the knees down. .  This is worse with gait.  An ESI made her feel worse so she has no interest in doing again.     She has had issues tolerating steroids.     She is on oxycodoe and sees Pain Management.     She has chronic migraines.  She has some HA every day but intensity is better.  She takes Fioricet as needed.    She is on Valproic acid for the migraine and h/o several seizures about 7-8 year ago.   Emgality was not helpful so she stopped  She had shingles and has itching in her right forehead and near her ear in 2020   The distribution was V1 and she had corneal issues.  Eye sight is now back to normal.     She is on Lyrica 50 mg po tid.     She did the  primary vaccinations initially and the Men B booster lin 2023.    Neuromyelitis optica history: She began to experience clumsiness in mid March 2020.  She had been placed on topiramate for migraines and had recently and had some dizziness and vertigo initially.  She fell and a day later began to have urinary incontinence and inability to move the left leg.   She went to the ED at [facility] and an MRI of the lumbar spine shoowed degenerative disc changes.    Of note, she had shingles a few days before the NMO symptoms.   She was prescribed a steroid pack and discharged.   The next day she was worse and her PCP sent her to the [facility] ED.   She had MRI of the brain and cervical and thoracic spine.   The MRI of the brain showed a remote right MCA stroke (she never had symptoms).   MRI of the spine showed multifocal lesions within the spinal cord, centrally but asymmetric.      She received 5 days of IV Solu-Medrol in the hospital.   She initially improved after the steroids and was  using a walker well a couple weeks later at discharge from rehab.  Balance was still poor but strength was good.   Of note she had no arm weakness in March or April.   Over the past couple days, she has felt much weaker with more left leg > arm weakness.  .  She also has had right flank numbness and a dysesthetic sensation (pressure like pain) since yesterday.      Bladder impoved with steroids and is continuing to do well (she is on Enablex).     She had the meningitis vaccination (both the [facility] and the B shots) and started [facility] June 2020.    MRIs The MRI of the brain 07/31/2018 shows a remote right MCA infarction with a small amount of associated hemosiderin.  There are also some scattered T2/flair hyperintense foci consistent with mild chronic microvascular ischemic changes.  There were no acute findings.  The MRI of the cervical spine shows T2 hyperintense signal within the spinal cord posteriorly to the right adjacent  to C2, posterior to the left adjacent to C3, and a little more to the right but central adjacent to C7.  The foci adjacent to C3 and C7 does have subtle enhancement after gadolinium was infused.  There are some degenerative changes at C4-C5, C5-C6 and C6-C7 but no definite nerve root compression and no spinal stenosis.   MRI of the thoracic spine shows diffuse patchy T2 hyperintensity that is predominantly central.   Number spine shows the abnormal signal within the spinal cord adjacent to T10 and T11.  There are degenerative changes at L3-L4, L4-L5 and L5-S1.  There is spinal stenosis at L4-L5.  With possibility for left L4 and L5 nerve root  Labs: Neuromyelitis optica IgG antibody was positive at 139 (< 3 normal) at diagnosis.  Lumbar puncture showed a small elevation of white blood cell count, predominantly lymphocytes and macrophages.  B12, copper were normal.  Herpes simplex virus and HIV were negative.  ACE was normal.  REVIEW OF SYSTEMS: Constitutional: No fevers, chills, sweats, or change in appetite Eyes: No visual changes, double vision, eye pain Ear, nose and throat: No hearing loss, ear pain, nasal congestion, sore throat Cardiovascular: No chest pain, palpitations Respiratory:  No shortness of breath at rest or with exertion.   No wheezes GastrointestinaI: No nausea, vomiting, diarrhea, abdominal pain, fecal incontinence Genitourinary: As above Musculoskeletal:  No neck pain, back pain Integumentary: No rash, pruritus, skin lesions Neurological: as above Psychiatric: No depression at this time.  No anxiety Endocrine: No palpitations, diaphoresis, change in appetite, change in weigh or increased thirst Hematologic/Lymphatic:  No anemia, purpura, petechiae. Allergic/Immunologic: No itchy/runny eyes, nasal congestion, recent allergic reactions, rashes  ALLERGIES: Allergies  Allergen Reactions   Prednisone Other (See Comments)    Not to be given at any point while patient is still  taking Soliris infusions.  Severe shaking and dehydration.   Amitriptyline Other (See Comments)   Bee Venom Other (See Comments)   Cymbalta [Duloxetine Hcl] Nausea Only    Constipation    Fexofenadine Other (See Comments)   Furosemide Other (See Comments)   Gabapentin Other (See Comments)   Hydroxychloroquine Sulfate Other (See Comments)   Morphine Other (See Comments)   Nsaids    Oxycodone Other (See Comments)    dizzy   Topiramate Other (See Comments)    HOME MEDICATIONS:  Current Outpatient Medications:    acetaminophen (TYLENOL) 325 MG tablet, Take 2 tablets (650 mg total) by  mouth every 6 (six) hours as needed for mild pain (or Fever >/= 101). (Patient not taking: Reported on 12/02/2022), Disp: , Rfl:    ALPRAZolam (XANAX) 0.5 MG tablet, Take 1 tablet (0.5 mg total) by mouth 3 (three) times daily as needed for anxiety or sleep., Disp: 10 tablet, Rfl: 0   amoxicillin (AMOXIL) 250 MG capsule, Take 250 mg by mouth 3 (three) times daily as needed (for ear infection/inflammation). , Disp: , Rfl:    baclofen (LIORESAL) 10 MG tablet, Take one pill po tid, Disp: 90 tablet, Rfl: 11   Butalbital-APAP-Caffeine (FIORICET PO), Take by mouth., Disp: , Rfl:    Cyanocobalamin (VITAMIN B-12 PO), Take 2 tablets by mouth daily., Disp: , Rfl:    darifenacin (ENABLEX) 15 MG 24 hr tablet, TAKE 1 TABLET BY MOUTH DAILY, Disp: 30 tablet, Rfl: 5   DULoxetine (CYMBALTA) 20 MG capsule, duloxetine 20 mg capsule,delayed release  Take 1 capsule every day by oral route., Disp: , Rfl:    Eculizumab (SOLIRIS IV), Inject 1,200 mg into the vein every 14 (fourteen) days. , Disp: , Rfl:    lidocaine (LIDODERM) 5 %, Place 1 patch onto the skin every 12 (twelve) hours., Disp: , Rfl:    loratadine (CLARITIN) 10 MG tablet, Take 10 mg by mouth daily., Disp: , Rfl:    magnesium hydroxide (MILK OF MAGNESIA) 400 MG/5ML suspension, Take 30 mLs by mouth daily as needed for mild constipation., Disp: 355 mL, Rfl: 0   ondansetron  (ZOFRAN) 4 MG tablet, Take 1 tablet (4 mg total) by mouth every 6 (six) hours as needed for nausea., Disp: 20 tablet, Rfl: 0   oxyCODONE-acetaminophen (PERCOCET) 10-325 MG tablet, Take 1 tablet by mouth every 6 (six) hours as needed., Disp: 10 tablet, Rfl: 0   pantoprazole (PROTONIX) 40 MG tablet, Take 1 tablet (40 mg total) by mouth 2 (two) times daily before a meal., Disp: 60 tablet, Rfl: 1   polyethylene glycol (MIRALAX) 17 g packet, Take 17 g by mouth every morning. (Patient not taking: Reported on 12/02/2022), Disp: 30 each, Rfl: 3   polyethylene glycol (MIRALAX) 17 g packet, Take 17 g by mouth daily. (Patient not taking: Reported on 12/02/2022), Disp: 14 each, Rfl: 0   potassium chloride SA (KLOR-CON) 20 MEQ tablet, Take 20 mEq by mouth 2 (two) times daily., Disp: , Rfl:    pregabalin (LYRICA) 50 MG capsule, Take 50 mg by mouth 3 (three) times daily., Disp: , Rfl:    UNABLE TO FIND, Med Name: CBD OIL that she uses topically for pain., Disp: , Rfl:    UNABLE TO FIND, daily. Med Name: fiber gummies, Disp: , Rfl:    valproic acid (DEPAKENE) 250 MG capsule, Take 1 capsule (250 mg total) by mouth at bedtime., Disp: 30 capsule, Rfl: 1   Vitamin D, Ergocalciferol, (DRISDOL) 1.25 MG (50000 UT) CAPS capsule, Take 1 capsule (50,000 Units total) by mouth every Monday. (Patient not taking: Reported on 12/02/2022), Disp: 4 capsule, Rfl: 0   vitamin E 1000 UNIT capsule, Take 1,000 Units by mouth daily., Disp: , Rfl:   PAST MEDICAL HISTORY: Past Medical History:  Diagnosis Date   Chest pain    Collagen vascular disease (HCC)    Complication of anesthesia    Blisters to mouth.   Depression    DJD (degenerative joint disease)    FH: CVA (cerebrovascular accident)    father   Fibromyalgia    GERD (gastroesophageal reflux disease)    Hypothyroidism  Lupus (HCC)    Migraines    Palpitations    Status post cholecystectomy    appendectomy and bilaateral oophorectomy   Tobacco use     PAST SURGICAL  HISTORY: Past Surgical History:  Procedure Laterality Date   BILATERAL OOPHORECTOMY     CARDIAC CATHETERIZATION     CHOLECYSTECTOMY     COLONOSCOPY WITH PROPOFOL N/A 01/03/2019   Procedure: COLONOSCOPY WITH PROPOFOL;  Surgeon: Malissa Hippo, MD;  [facility]: [facility];  Service: Endoscopy;  Laterality: N/A;   ESOPHAGOGASTRODUODENOSCOPY (EGD) WITH PROPOFOL N/A 01/02/2019   Procedure: ESOPHAGOGASTRODUODENOSCOPY (EGD) WITH PROPOFOL;  Surgeon: Malissa Hippo, MD;  Location: [facility];  Service: Endoscopy;  Laterality: N/A;   POLYPECTOMY  01/03/2019   Procedure: POLYPECTOMY;  Surgeon: Malissa Hippo, MD;  Location: [facility];  Service: Endoscopy;;    FAMILY HISTORY: Family History  Problem Relation Age of Onset   Stroke Father     SOCIAL HISTORY:  Social History   Socioeconomic History   Marital status: Single    Spouse name: Not on file   Number of children: 3   Years of education: Not on file   Highest education level: Not on file  Occupational History   Occupation: disability  Tobacco Use   Smoking status: Every Day    Current packs/day: 0.50    Types: Cigarettes   Smokeless tobacco: Never   Tobacco comments:    quit last week 01/03/19  Vaping Use   Vaping status: Never Used  Substance and Sexual Activity   Alcohol use: No   Drug use: Never   Sexual activity: Not on file  Other Topics Concern   Not on file  Social History Narrative   Friend lives with her   Caffeine use: coffee daily   Left handed    Social Drivers of Corporate investment banker Strain: Not on file  Food Insecurity: Not on file  Transportation Needs: Not on file  Physical Activity: Not on file  Stress: Not on file  Social Connections: Not on file  Intimate Partner Violence: Not on file     PHYSICAL EXAM  There were no vitals filed for this visit.   There is no height or weight on file to calculate BMI.   General: The patient is well-developed and well-nourished and in  no acute distress   Skin: Extremities are without rash or edema.   Neurologic Exam  Mental status: The patient is alert and oriented x 3 at the time of the examination. The patient has apparent normal recent and remote memory, with an apparently normal attention span and concentration ability.   Speech is normal.  Cranial nerves: Extraocular movements are full.   Facial symmetry is present. .Facial strength is normal.  Trapezius and sternocleidomastoid strength is normal. No dysarthria is noted.  No obvious hearing deficits are noted.  Motor:  Muscle bulk is normal.   Mildly increased muscle tone in the left leg.  Strength was 4 -/5 in the iliopsoas muscle and 4/5 elsewhere in left leg.  Strength is 4+/5 in the right leg.  Strength was 5/5 in the right arm strength is 5/5 in arms   Sensory: She now reports normal sensation to touch and temperature as well as vibration sensation.  Coordination: Cerebellar testing reveals good right finger-nose-finger but she is unable to do left finger-nose-finger.  Reduced heel-to-shin, worse on the left.  Gait and station: Stands up using arms to help.  She is  able to walk within the room with unilateral support.  She cannot tandem.  She has a positive Romberg   Reflexes: Deep tendon reflexes are increased in the left leg relative to the right.  Spread at the knees.  1 beat clonus left ankle    DIAGNOSTIC DATA (LABS, IMAGING, TESTING) - I reviewed patient records, labs, notes, testing and imaging myself where available.  Lab Results  Component Value Date   WBC 4.7 12/02/2022   HGB 12.3 12/02/2022   HCT 36.0 12/02/2022   MCV 90 12/02/2022   PLT 131 (L) 12/02/2022      Component Value Date/Time   NA 138 12/02/2022 1617   K 4.2 12/02/2022 1617   CL 100 12/02/2022 1617   CO2 27 12/02/2022 1617   GLUCOSE 91 12/02/2022 1617   GLUCOSE 92 11/28/2021 1743   BUN 16 12/02/2022 1617   CREATININE 1.10 (H) 12/02/2022 1617   CALCIUM 9.7 12/02/2022 1617    PROT 7.3 12/02/2022 1617   ALBUMIN 4.2 12/02/2022 1617   AST 17 12/02/2022 1617   ALT 11 12/02/2022 1617   ALKPHOS 90 12/02/2022 1617   BILITOT <0.2 12/02/2022 1617   GFRNONAA >60 11/28/2021 1743   GFRAA 92 12/20/2019 1558    No results found for: "HGBA1C" Lab Results  Component Value Date   VITAMINB12 1,457 (H) 08/01/2018   _____________________  No diagnosis found.   1.   Continue Soliris for neuromyelitis optica. She is good with vaccinations until 2026.   If q2 weeks is difficult, we can switch to Ultomiris which has the same mechanism of action. 2.  Check lab work today.   3.  She has moderately severe spinal stenosis at L4-L5, moderate spinal stenosis at L3-L4 mild spinal stenosis at L5-S1. If symptoms worsen again I would recommend an ESI or that she see neurosurgery. 4   She stopped baclofen for spasticity. 4.   Continue Lyrica for her postherpetic neuralgia (V1 distribution)  -- pain is much better than a couple years ago.  Continue patches for lumbar pain.  5.   she will return to see me in 6 months or sooner if there are new or worsening neurologic symptoms.   41-minute office visit with the majority of the time spent face-to-face for history and physical, discussion/counseling and decision-making.  Additional time with record review and documentation.  This visit is part of a comprehensive longitudinal care medical relationship regarding the patients primary diagnosis of neuromyelitis optica and related concerns.   Takisha Pelle A. Epimenio Foot, MD, [facility] 06/24/2023, 10:33 AM Certified in Neurology, Clinical Neurophysiology, Sleep Medicine, Pain Medicine and Neuroimaging  [facility] [address], [address] [place], [place] [address] [phone]

## 2023-06-30 ENCOUNTER — Telehealth: Payer: Self-pay | Admitting: Neurology

## 2023-06-30 NOTE — Telephone Encounter (Signed)
 [facility] conf

## 2023-07-01 ENCOUNTER — Ambulatory Visit: Payer: Medicare HMO | Admitting: Neurology

## 2023-07-03 DIAGNOSIS — G360 Neuromyelitis optica [Devic]: Secondary | ICD-10-CM | POA: Diagnosis not present

## 2023-07-10 DIAGNOSIS — G360 Neuromyelitis optica [Devic]: Secondary | ICD-10-CM | POA: Diagnosis not present

## 2023-07-24 DIAGNOSIS — G360 Neuromyelitis optica [Devic]: Secondary | ICD-10-CM | POA: Diagnosis not present

## 2023-07-27 ENCOUNTER — Telehealth: Payer: Self-pay | Admitting: *Deleted

## 2023-07-27 NOTE — Telephone Encounter (Signed)
 Received fax below. I called [facility] where pt receives Soliris at home at [phone]. Spoke w/ Baxter Hire. They received and are planning to redraw labs at next infusoin 07/31/23. Nothing further needed.

## 2023-08-07 DIAGNOSIS — G360 Neuromyelitis optica [Devic]: Secondary | ICD-10-CM | POA: Diagnosis not present

## 2023-08-10 ENCOUNTER — Telehealth: Payer: Self-pay | Admitting: *Deleted

## 2023-08-10 NOTE — Telephone Encounter (Signed)
 Received fax from [facility] for the below fax . I called [facility] where pt receives Soliris at home at [phone]. Spoke w/ Baxter Hire, they are planning to re-draw labs on 08/14/23. Baxter Hire, RN said they are had issues with Nurse education with the blood clotting. Baxter Hire said if provider would like more recent lab they can send her to [facility] she can reached at [phone] directly.    Dr.Sater are you okay with patient having CBC with Diff collected then? It looks like last CBC was collected on 05/22/23 ( scanned in epic) or okay to wait until 08/14/23?

## 2023-08-17 DIAGNOSIS — M25551 Pain in right hip: Secondary | ICD-10-CM | POA: Diagnosis not present

## 2023-08-17 DIAGNOSIS — G360 Neuromyelitis optica [Devic]: Secondary | ICD-10-CM | POA: Diagnosis not present

## 2023-08-17 DIAGNOSIS — M542 Cervicalgia: Secondary | ICD-10-CM | POA: Diagnosis not present

## 2023-08-17 DIAGNOSIS — M329 Systemic lupus erythematosus, unspecified: Secondary | ICD-10-CM | POA: Diagnosis not present

## 2023-08-17 DIAGNOSIS — Z79891 Long term (current) use of opiate analgesic: Secondary | ICD-10-CM | POA: Diagnosis not present

## 2023-08-17 DIAGNOSIS — M25552 Pain in left hip: Secondary | ICD-10-CM | POA: Diagnosis not present

## 2023-08-17 DIAGNOSIS — M25512 Pain in left shoulder: Secondary | ICD-10-CM | POA: Diagnosis not present

## 2023-08-17 DIAGNOSIS — M549 Dorsalgia, unspecified: Secondary | ICD-10-CM | POA: Diagnosis not present

## 2023-08-17 DIAGNOSIS — G43809 Other migraine, not intractable, without status migrainosus: Secondary | ICD-10-CM | POA: Diagnosis not present

## 2023-08-17 DIAGNOSIS — G894 Chronic pain syndrome: Secondary | ICD-10-CM | POA: Diagnosis not present

## 2023-08-17 DIAGNOSIS — M25511 Pain in right shoulder: Secondary | ICD-10-CM | POA: Diagnosis not present

## 2023-08-21 DIAGNOSIS — G360 Neuromyelitis optica [Devic]: Secondary | ICD-10-CM | POA: Diagnosis not present

## 2023-09-04 DIAGNOSIS — G360 Neuromyelitis optica [Devic]: Secondary | ICD-10-CM | POA: Diagnosis not present

## 2023-09-09 ENCOUNTER — Telehealth: Payer: Self-pay | Admitting: Neurology

## 2023-09-09 NOTE — Telephone Encounter (Signed)
 [facility] conf

## 2023-09-10 ENCOUNTER — Encounter: Payer: Self-pay | Admitting: Neurology

## 2023-09-10 ENCOUNTER — Telehealth: Payer: Self-pay | Admitting: Neurology

## 2023-09-10 ENCOUNTER — Ambulatory Visit: Payer: Medicare HMO | Admitting: Neurology

## 2023-09-10 NOTE — Telephone Encounter (Signed)
 Called [phone]. Got automated message that call could not be completed.  Called Ladean Picket (son on [place]) at [phone]. Relayed we are trying to reach mother and unable to reach her.  Relayed we are trying to offer sooner appt 09/15/23 at 930a with Dr. Godwin Lat. He will try and get in touch with mother and call back.

## 2023-09-10 NOTE — Telephone Encounter (Signed)
 Pt called in regard to cancel appt . Pt thought appointment  was tomorrow Inform the patient Appt was today . Reschedule appt  .   Appt Reschedule .

## 2023-09-12 DIAGNOSIS — G360 Neuromyelitis optica [Devic]: Secondary | ICD-10-CM | POA: Diagnosis not present

## 2023-09-14 DIAGNOSIS — Z79891 Long term (current) use of opiate analgesic: Secondary | ICD-10-CM | POA: Diagnosis not present

## 2023-09-14 DIAGNOSIS — G360 Neuromyelitis optica [Devic]: Secondary | ICD-10-CM | POA: Diagnosis not present

## 2023-09-14 DIAGNOSIS — G43809 Other migraine, not intractable, without status migrainosus: Secondary | ICD-10-CM | POA: Diagnosis not present

## 2023-09-14 DIAGNOSIS — M25512 Pain in left shoulder: Secondary | ICD-10-CM | POA: Diagnosis not present

## 2023-09-14 DIAGNOSIS — M25552 Pain in left hip: Secondary | ICD-10-CM | POA: Diagnosis not present

## 2023-09-14 DIAGNOSIS — M329 Systemic lupus erythematosus, unspecified: Secondary | ICD-10-CM | POA: Diagnosis not present

## 2023-09-14 DIAGNOSIS — M25511 Pain in right shoulder: Secondary | ICD-10-CM | POA: Diagnosis not present

## 2023-09-14 DIAGNOSIS — M25551 Pain in right hip: Secondary | ICD-10-CM | POA: Diagnosis not present

## 2023-09-14 DIAGNOSIS — M542 Cervicalgia: Secondary | ICD-10-CM | POA: Diagnosis not present

## 2023-09-14 DIAGNOSIS — G894 Chronic pain syndrome: Secondary | ICD-10-CM | POA: Diagnosis not present

## 2023-09-14 DIAGNOSIS — M549 Dorsalgia, unspecified: Secondary | ICD-10-CM | POA: Diagnosis not present

## 2023-09-14 NOTE — Telephone Encounter (Signed)
 Called son, Dean Every. He will have mother call office back to schedule sooner appt.  Phone room: if pt calls, please offer 09/21/23 at 930a w/ Dr. Godwin Lat now for sooner appt

## 2023-09-14 NOTE — Telephone Encounter (Signed)
 Called pt. Rescheduled appt for 10/06/23 at 3pm with Dr. Godwin Lat.

## 2023-09-14 NOTE — Telephone Encounter (Signed)
 Called pt at [phone]. Mailbox full, unable to leave message.

## 2023-09-14 NOTE — Telephone Encounter (Signed)
 Pt called back and was very confused. Pt states she has to drive over 40 min and she is not able to make a 9:30a appt.

## 2023-09-18 DIAGNOSIS — G360 Neuromyelitis optica [Devic]: Secondary | ICD-10-CM | POA: Diagnosis not present

## 2023-09-26 DIAGNOSIS — Z888 Allergy status to other drugs, medicaments and biological substances status: Secondary | ICD-10-CM | POA: Diagnosis not present

## 2023-09-26 DIAGNOSIS — Z885 Allergy status to narcotic agent status: Secondary | ICD-10-CM | POA: Diagnosis not present

## 2023-09-26 DIAGNOSIS — K5900 Constipation, unspecified: Secondary | ICD-10-CM | POA: Diagnosis not present

## 2023-09-26 DIAGNOSIS — K5909 Other constipation: Secondary | ICD-10-CM | POA: Diagnosis not present

## 2023-09-26 DIAGNOSIS — F1120 Opioid dependence, uncomplicated: Secondary | ICD-10-CM | POA: Diagnosis not present

## 2023-09-30 ENCOUNTER — Telehealth: Payer: Self-pay | Admitting: *Deleted

## 2023-09-30 NOTE — Telephone Encounter (Signed)
 Office notes faxed to number listed on sheet below, confirmation received.

## 2023-10-05 ENCOUNTER — Telehealth: Payer: Self-pay | Admitting: Neurology

## 2023-10-05 NOTE — Telephone Encounter (Signed)
 [facility] conf

## 2023-10-06 ENCOUNTER — Ambulatory Visit: Admitting: Neurology

## 2023-10-06 ENCOUNTER — Telehealth: Payer: Self-pay | Admitting: Neurology

## 2023-10-06 NOTE — Telephone Encounter (Signed)
 Pt had to cx due to conflict, she has been r/s

## 2023-11-11 ENCOUNTER — Telehealth: Payer: Self-pay | Admitting: *Deleted

## 2023-11-11 NOTE — Telephone Encounter (Signed)
 Received the following email:    Reviewed pt chart. She no showed 06/11/23, cx 06/24/23, cx 07/01/23, no showed 09/10/23, cx 10/06/23.  I called pt and scheduled appt for 11/12/23 at 11a with Dr. Vear.  Cx 04/2024 appt since she is coming in sooner. Son will bring her to appt.  I sent update to Marry above letting her know.

## 2023-11-12 ENCOUNTER — Encounter: Payer: Self-pay | Admitting: Neurology

## 2023-11-12 ENCOUNTER — Telehealth: Payer: Self-pay | Admitting: Neurology

## 2023-11-12 ENCOUNTER — Ambulatory Visit (INDEPENDENT_AMBULATORY_CARE_PROVIDER_SITE_OTHER): Admitting: Neurology

## 2023-11-12 VITALS — BP 109/67 | HR 76

## 2023-11-12 DIAGNOSIS — G360 Neuromyelitis optica [Devic]: Secondary | ICD-10-CM | POA: Diagnosis not present

## 2023-11-12 DIAGNOSIS — Z79899 Other long term (current) drug therapy: Secondary | ICD-10-CM

## 2023-11-12 DIAGNOSIS — B0229 Other postherpetic nervous system involvement: Secondary | ICD-10-CM

## 2023-11-12 DIAGNOSIS — M791 Myalgia, unspecified site: Secondary | ICD-10-CM | POA: Insufficient documentation

## 2023-11-12 DIAGNOSIS — N319 Neuromuscular dysfunction of bladder, unspecified: Secondary | ICD-10-CM

## 2023-11-12 DIAGNOSIS — R21 Rash and other nonspecific skin eruption: Secondary | ICD-10-CM | POA: Insufficient documentation

## 2023-11-12 MED ORDER — DARIFENACIN HYDROBROMIDE ER 15 MG PO TB24: 15.0000 mg | ORAL_TABLET | Freq: Every day | ORAL | 5 refills | Status: DC

## 2023-11-12 NOTE — Telephone Encounter (Signed)
 Just FYI, this pt had her 3rd no show today since February.

## 2023-11-12 NOTE — Telephone Encounter (Signed)
 Pt showed up late to appt. MD agreed to see her still.

## 2023-11-12 NOTE — Telephone Encounter (Signed)
 Called pt twice no answer and was unable to LVM.

## 2023-11-12 NOTE — Progress Notes (Signed)
 [facility]  PATIENT: Susan Hodge DOB: 01-Nov-1950  REFERRING DOCTOR OR PCP:  Jon Stager, NP SOURCE: Patient, daughter, notes from hospital and rehab admissions, multiple imaging and lab reports, MRI images of brain, cervical, thoracic and lumbar spine were reviewed  _________________________________   HISTORICAL  CHIEF COMPLAINT:  Chief Complaint  Patient presents with   RM11/NEUROMYELITIS OPTICA    Pt is here with her Son. Pt states she is doing alright since her last Appointment.  Pt states that she has edema in both feet. Pt states that her legs feel tight. Pt states that her legs are numb from the knee down.    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  She is tolerating it well though sometimes is having difficulties with such frequent infusions.  We discussed Ultomiris as a possibility and she is interested.  Because the mechanism of action is the same she will continue to need to get the meningitis vaccinations  She has not had any relapses since starting Soliris .  This compares to several relapses in rapid succession earlier in 2020.  In her home she does not need support to get around.   She is able to walk > 200 feet with a walker. However, she tries not to walk too far without a rest to prevent a fall.    She has weakness and spasticity, left > right.     Left leg spasticity at times.    She notes some shaking in the left side of her body at times  She has left leg tingling.    Spasms also involve the trunk.    They are much better than a couple years ago and w just takes baclofen  prn.    Vision is fine.    She has urinary urgency, helped by Enablex .   She also has back pain and leg pain form the knees down. .  This is worse with gait.  An ESI made her feel worse so she has no interest in doing again.     She has had issues tolerating steroids.     She is on oxycodoe and sees Pain Management.     She has chronic  migraines.  She has some HA every day but intensity is better.  She takes Fioricet as needed.    She is on Valproic  acid for the migraine and h/o several seizures about 7-8 year ago.   Emgality was not helpful so she stopped  She has edema in both legs despite compression stockings.     She had shingles and has itching in her right forehead and near her ear in 2020   The distribution was V1 and she had corneal issues.  Eye sight was effected but back to baseline.     Still has some pain helped by Lyrica .    She has urinary urgency with some urge incontinence, this was helped by  Enablex  in the past  She did the primary vaccinations initially and the Men B booster lin 2023.    Neuromyelitis optica history: She began to experience clumsiness in mid March 2020.  She had been placed on topiramate for migraines and had recently and had some dizziness and vertigo initially.  She fell and a day later began to have urinary incontinence and inability to move the left leg.   She went to the ED at [facility] and an MRI of the lumbar spine shoowed degenerative disc changes.  Of note, she had shingles a few days before the NMO symptoms.   She was prescribed a steroid pack and discharged.   The next day she was worse and her PCP sent her to the [facility] ED.   She had MRI of the brain and cervical and thoracic spine.   The MRI of the brain showed a remote right MCA stroke (she never had symptoms).   MRI of the spine showed multifocal lesions within the spinal cord, centrally but asymmetric.      She received 5 days of IV Solu-Medrol  in the hospital.   She initially improved after the steroids and was using a walker well a couple weeks later at discharge from rehab.  Balance was still poor but strength was good.   Of note she had no arm weakness in March or April.   Over the past couple days, she has felt much weaker with more left leg > arm weakness.  .  She also has had right flank numbness and a dysesthetic  sensation (pressure like pain) since yesterday.      Bladder impoved with steroids and is continuing to do well (she is on Enablex ).     She had the meningitis vaccination (both the [facility] [place] and the B shots) and started Soliris  June 2020.    MRIs The MRI of the brain 07/31/2018 shows a remote right MCA infarction with a small amount of associated hemosiderin.  There are also some scattered T2/flair hyperintense foci consistent with mild chronic microvascular ischemic changes.  There were no acute findings.  The MRI of the cervical spine shows T2 hyperintense signal within the spinal cord posteriorly to the right adjacent to C2, posterior to the left adjacent to C3, and a little more to the right but central adjacent to C7.  The foci adjacent to C3 and C7 does have subtle enhancement after gadolinium was infused.  There are some degenerative changes at C4-C5, C5-C6 and C6-C7 but no definite nerve root compression and no spinal stenosis.   MRI of the thoracic spine shows diffuse patchy T2 hyperintensity that is predominantly central.   Number spine shows the abnormal signal within the spinal cord adjacent to T10 and T11.  There are degenerative changes at L3-L4, L4-L5 and L5-S1.  There is spinal stenosis at L4-L5.  With possibility for left L4 and L5 nerve root  Labs: Neuromyelitis optica IgG antibody was positive at 139 (< 3 normal) at diagnosis.  Lumbar puncture showed a small elevation of white blood cell count, predominantly lymphocytes and macrophages.  B12, copper  were normal.  Herpes simplex virus and HIV were negative.  ACE was normal.  REVIEW OF SYSTEMS: Constitutional: No fevers, chills, sweats, or change in appetite Eyes: No visual changes, double vision, eye pain Ear, nose and throat: No hearing loss, ear pain, nasal congestion, sore throat Cardiovascular: No chest pain, palpitations Respiratory:  No shortness of breath at rest or with exertion.   No wheezes GastrointestinaI: No nausea,  vomiting, diarrhea, abdominal pain, fecal incontinence Genitourinary: As above Musculoskeletal:  No neck pain, back pain Integumentary: No rash, pruritus, skin lesions Neurological: as above Psychiatric: No depression at this time.  No anxiety Endocrine: No palpitations, diaphoresis, change in appetite, change in weigh or increased thirst Hematologic/Lymphatic:  No anemia, purpura, petechiae. Allergic/Immunologic: No itchy/runny eyes, nasal congestion, recent allergic reactions, rashes  ALLERGIES: Allergies  Allergen Reactions   Prednisone  Other (See Comments)    Not to be given at any point while  patient is still taking Soliris  infusions.  Severe shaking and dehydration.   Amitriptyline Other (See Comments)    Other Reaction(s): Psychosis, Psychosis (intolerance)   Amitriptyline Hcl Other (See Comments)   Bee Venom Other (See Comments)   Cymbalta [Duloxetine Hcl] Nausea Only    Constipation    Fexofenadine Other (See Comments)   Furosemide Other (See Comments)   Gabapentin Other (See Comments)   Hydroxychloroquine Sulfate Other (See Comments)   Ibuprofen Other (See Comments)   Morphine Other (See Comments)   Nabumetone Other (See Comments)   Nsaids    Oxycodone  Other (See Comments)    dizzy   Tizanidine  Other (See Comments)   Topiramate Other (See Comments)    HOME MEDICATIONS:  Current Outpatient Medications:    ALPRAZolam  (XANAX ) 0.5 MG tablet, Take 1 tablet (0.5 mg total) by mouth 3 (three) times daily as needed for anxiety or sleep., Disp: 10 tablet, Rfl: 0   baclofen  (LIORESAL ) 10 MG tablet, Take one pill po tid, Disp: 90 tablet, Rfl: 11   Butalbital-APAP-Caffeine (FIORICET PO), Take by mouth., Disp: , Rfl:    Cyanocobalamin  (VITAMIN B-12 PO), Take 2 tablets by mouth daily., Disp: , Rfl:    Eculizumab  (SOLIRIS  IV), Inject 1,200 mg into the vein every 14 (fourteen) days. , Disp: , Rfl:    lidocaine  (LIDODERM ) 5 %, Place 1 patch onto the skin every 12 (twelve) hours.,  Disp: , Rfl:    loratadine  (CLARITIN ) 10 MG tablet, Take 10 mg by mouth daily., Disp: , Rfl:    oxyCODONE -acetaminophen  (PERCOCET) 10-325 MG tablet, Take 1 tablet by mouth every 6 (six) hours as needed., Disp: 10 tablet, Rfl: 0   pantoprazole  (PROTONIX ) 40 MG tablet, Take 1 tablet (40 mg total) by mouth 2 (two) times daily before a meal., Disp: 60 tablet, Rfl: 1   pregabalin  (LYRICA ) 50 MG capsule, Take 50 mg by mouth 3 (three) times daily., Disp: , Rfl:    valproic  acid (DEPAKENE ) 250 MG capsule, Take 1 capsule (250 mg total) by mouth at bedtime., Disp: 30 capsule, Rfl: 1   Vitamin D , Ergocalciferol , (DRISDOL ) 1.25 MG (50000 UT) CAPS capsule, Take 1 capsule (50,000 Units total) by mouth every Monday., Disp: 4 capsule, Rfl: 0   vitamin E  1000 UNIT capsule, Take 1,000 Units by mouth daily., Disp: , Rfl:    acetaminophen  (TYLENOL ) 325 MG tablet, Take 2 tablets (650 mg total) by mouth every 6 (six) hours as needed for mild pain (or Fever >/= 101). (Patient not taking: Reported on 11/12/2023), Disp: , Rfl:    amoxicillin (AMOXIL) 250 MG capsule, Take 250 mg by mouth 3 (three) times daily as needed (for ear infection/inflammation).  (Patient not taking: Reported on 11/12/2023), Disp: , Rfl:    darifenacin  (ENABLEX ) 15 MG 24 hr tablet, Take 1 tablet (15 mg total) by mouth daily., Disp: 30 tablet, Rfl: 5   DULoxetine (CYMBALTA) 20 MG capsule, duloxetine 20 mg capsule,delayed release  Take 1 capsule every day by oral route. (Patient not taking: Reported on 11/12/2023), Disp: , Rfl:    magnesium  hydroxide (MILK OF MAGNESIA) 400 MG/5ML suspension, Take 30 mLs by mouth daily as needed for mild constipation. (Patient not taking: Reported on 11/12/2023), Disp: 355 mL, Rfl: 0   ondansetron  (ZOFRAN ) 4 MG tablet, Take 1 tablet (4 mg total) by mouth every 6 (six) hours as needed for nausea. (Patient not taking: Reported on 11/12/2023), Disp: 20 tablet, Rfl: 0   polyethylene glycol (MIRALAX ) 17 g packet, Take 17  g by mouth  every morning. (Patient not taking: Reported on 11/12/2023), Disp: 30 each, Rfl: 3   polyethylene glycol (MIRALAX ) 17 g packet, Take 17 g by mouth daily. (Patient not taking: Reported on 11/12/2023), Disp: 14 each, Rfl: 0   potassium chloride SA (KLOR-CON) 20 MEQ tablet, Take 20 mEq by mouth 2 (two) times daily., Disp: , Rfl:    UNABLE TO FIND, Med Name: CBD OIL that she uses topically for pain., Disp: , Rfl:    UNABLE TO FIND, daily. Med Name: fiber gummies, Disp: , Rfl:   PAST MEDICAL HISTORY: Past Medical History:  Diagnosis Date   Chest pain    Collagen vascular disease (HCC)    Complication of anesthesia    Blisters to mouth.   Depression    DJD (degenerative joint disease)    FH: CVA (cerebrovascular accident)    father   Fibromyalgia    GERD (gastroesophageal reflux disease)    Hypothyroidism    Lupus    Migraines    Palpitations    Status post cholecystectomy    appendectomy and bilaateral oophorectomy   Tobacco use     PAST SURGICAL HISTORY: Past Surgical History:  Procedure Laterality Date   BILATERAL OOPHORECTOMY     CARDIAC CATHETERIZATION     CHOLECYSTECTOMY     COLONOSCOPY WITH PROPOFOL  N/A 01/03/2019   Procedure: COLONOSCOPY WITH PROPOFOL ;  Surgeon: Golda Claudis PENNER, MD;  Location: [facility];  Service: Endoscopy;  Laterality: N/A;   ESOPHAGOGASTRODUODENOSCOPY (EGD) WITH PROPOFOL  N/A 01/02/2019   Procedure: ESOPHAGOGASTRODUODENOSCOPY (EGD) WITH PROPOFOL ;  Surgeon: Golda Claudis PENNER, MD;  Location: [facility];  Service: Endoscopy;  Laterality: N/A;   POLYPECTOMY  01/03/2019   Procedure: POLYPECTOMY;  Surgeon: Golda Claudis PENNER, MD;  Location: [facility];  Service: Endoscopy;;    FAMILY HISTORY: Family History  Problem Relation Age of Onset   Stroke Father     SOCIAL HISTORY:  Social History   Socioeconomic History   Marital status: Single    Spouse name: Not on file   Number of children: 3   Years of education: Not on file   Highest education  level: Not on file  Occupational History   Occupation: disability  Tobacco Use   Smoking status: Every Day    Current packs/day: 0.50    Types: Cigarettes   Smokeless tobacco: Never   Tobacco comments:    quit last week 01/03/19  Vaping Use   Vaping status: Never Used  Substance and Sexual Activity   Alcohol  use: No   Drug use: Never   Sexual activity: Not on file  Other Topics Concern   Not on file  Social History Narrative   Friend lives with her   Caffeine use: coffee daily   Left handed    Social Drivers of Corporate investment banker Strain: Not on file  Food Insecurity: Not on file  Transportation Needs: Not on file  Physical Activity: Not on file  Stress: Not on file  Social Connections: Not on file  Intimate Partner Violence: Not on file     PHYSICAL EXAM  Vitals:   11/12/23 1148  BP: 109/67  Pulse: 76     There is no height or weight on file to calculate BMI.   General: The patient is well-developed and well-nourished and in no acute distress   Skin: Extremities are without rash or edema.   Neurologic Exam  Mental status: The patient is alert and oriented x  3 at the time of the examination. The patient has apparent normal recent and remote memory, with an apparently normal attention span and concentration ability.   Speech is normal.  Cranial nerves: Extraocular movements are full.   Facial symmetry is present. .Facial strength is normal.  Trapezius and sternocleidomastoid strength is normal. No dysarthria is noted.  No obvious hearing deficits are noted.  Motor:  Muscle bulk is normal.   Mildly increased muscle tone in the left leg.  Strength was 4 -/5 in the iliopsoas muscle and 4/5 elsewhere in left leg.  Strength is 4+/5 in the right leg.  Strength was 5/5 in the right arm strength is 5/5 in arms   Sensory: She now reports normal sensation to touch and temperature as well as vibration sensation.  Coordination: Cerebellar testing reveals good  right finger-nose-finger but she is unable to do left finger-nose-finger.  Reduced heel-to-shin, worse on the left.  Gait and station: Stands up using arms to help.  She is able to walk within the room with a reduced stride without support.  She cannot tandem.  She has a positive Romberg   Reflexes: Deep tendon reflexes are increased in the left leg relative to the right.  Spread at the knees.  1 beat clonus left ankle    DIAGNOSTIC DATA (LABS, IMAGING, TESTING) - I reviewed patient records, labs, notes, testing and imaging myself where available.  Lab Results  Component Value Date   WBC 4.7 12/02/2022   HGB 12.3 12/02/2022   HCT 36.0 12/02/2022   MCV 90 12/02/2022   PLT 131 (L) 12/02/2022      Component Value Date/Time   NA 138 12/02/2022 1617   K 4.2 12/02/2022 1617   CL 100 12/02/2022 1617   CO2 27 12/02/2022 1617   GLUCOSE 91 12/02/2022 1617   GLUCOSE 92 11/28/2021 1743   BUN 16 12/02/2022 1617   CREATININE 1.10 (H) 12/02/2022 1617   CALCIUM  9.7 12/02/2022 1617   PROT 7.3 12/02/2022 1617   ALBUMIN 4.2 12/02/2022 1617   AST 17 12/02/2022 1617   ALT 11 12/02/2022 1617   ALKPHOS 90 12/02/2022 1617   BILITOT <0.2 12/02/2022 1617   GFRNONAA >60 11/28/2021 1743   GFRAA 92 12/20/2019 1558    No results found for: HGBA1C Lab Results  Component Value Date   VITAMINB12 1,457 (H) 08/01/2018   _____________________  Neuromyelitis optica (devic) (HCC) - Plan: CBC with Differential/Platelet, Neuromyelitis optica autoab, IgG, Comprehensive metabolic panel with GFR, Thyroid  Panel With TSH  High risk medication use - Plan: CBC with Differential/Platelet, Neuromyelitis optica autoab, IgG, Comprehensive metabolic panel with GFR, Thyroid  Panel With TSH   1.   We will look into switching the Soliris  to Ultomiris for neuromyelitis optica. She is good with vaccinations until 2026.    2.  Check lab work today.   3.  She has moderately severe spinal stenosis at L4-L5, moderate  spinal stenosis at L3-L4 mild spinal stenosis at L5-S1.  She feels her spine issues are stable.  If symptoms worsen again I would recommend an ESI or that she see neurosurgery. 4   She stopped baclofen  for spasticity. 4.   Continue Lyrica  for her postherpetic neuralgia (V1 distribution)  -- pain is much better than a couple years ago.  Continue patches for lumbar pain.  5.   We will get her back on Enablex  for her bladder.  If not well covered by insurance consider Detrol or other medication.   6.  She will return to see me in 6 months or sooner if there are new or worsening neurologic symptoms.    This visit is part of a comprehensive longitudinal care medical relationship regarding the patients primary diagnosis of neuromyelitis optica and related concerns.   Ediel Unangst A. Vear, MD, [facility] 11/12/2023, 1:08 PM Certified in Neurology, Clinical Neurophysiology, Sleep Medicine, Pain Medicine and Neuroimaging  [facility] [address] [place], [place] [address] [phone]

## 2023-11-18 ENCOUNTER — Ambulatory Visit: Payer: Self-pay | Admitting: Neurology

## 2023-11-18 LAB — CBC WITH DIFFERENTIAL/PLATELET
Basophils Absolute: 0.1 x10E3/uL (ref 0.0–0.2)
Basos: 1 %
EOS (ABSOLUTE): 0.2 x10E3/uL (ref 0.0–0.4)
Eos: 3 %
Hematocrit: 33.6 % — ABNORMAL LOW (ref 34.0–46.6)
Hemoglobin: 11.4 g/dL (ref 11.1–15.9)
Immature Grans (Abs): 0 x10E3/uL (ref 0.0–0.1)
Immature Granulocytes: 0 %
Lymphocytes Absolute: 1.4 x10E3/uL (ref 0.7–3.1)
Lymphs: 23 %
MCH: 30.9 pg (ref 26.6–33.0)
MCHC: 33.9 g/dL (ref 31.5–35.7)
MCV: 91 fL (ref 79–97)
Monocytes Absolute: 0.5 x10E3/uL (ref 0.1–0.9)
Monocytes: 9 %
Neutrophils Absolute: 3.8 x10E3/uL (ref 1.4–7.0)
Neutrophils: 64 %
Platelets: 129 x10E3/uL — ABNORMAL LOW (ref 150–450)
RBC: 3.69 x10E6/uL — ABNORMAL LOW (ref 3.77–5.28)
RDW: 13.8 % (ref 11.7–15.4)
WBC: 6 x10E3/uL (ref 3.4–10.8)

## 2023-11-18 LAB — COMPREHENSIVE METABOLIC PANEL WITH GFR
ALT: 9 IU/L (ref 0–32)
AST: 17 IU/L (ref 0–40)
Albumin: 3.8 g/dL (ref 3.8–4.8)
Alkaline Phosphatase: 81 IU/L (ref 44–121)
BUN/Creatinine Ratio: 17 (ref 12–28)
BUN: 17 mg/dL (ref 8–27)
Bilirubin Total: 0.2 mg/dL (ref 0.0–1.2)
CO2: 22 mmol/L (ref 20–29)
Calcium: 8.8 mg/dL (ref 8.7–10.3)
Chloride: 102 mmol/L (ref 96–106)
Creatinine, Ser: 1 mg/dL (ref 0.57–1.00)
Globulin, Total: 2.9 g/dL (ref 1.5–4.5)
Glucose: 83 mg/dL (ref 70–99)
Potassium: 4.2 mmol/L (ref 3.5–5.2)
Sodium: 137 mmol/L (ref 134–144)
Total Protein: 6.7 g/dL (ref 6.0–8.5)
eGFR: 60 mL/min/1.73

## 2023-11-18 LAB — NEUROMYELITIS OPTICA AUTOAB, IGG: NMO IgG Autoantibodies: 321.7 U/mL — AB (ref 0.0–3.0)

## 2023-11-18 LAB — THYROID PANEL WITH TSH
Free Thyroxine Index: 2 (ref 1.2–4.9)
T3 Uptake Ratio: 31 % (ref 24–39)
T4, Total: 6.4 ug/dL (ref 4.5–12.0)
TSH: 5.05 u[IU]/mL — ABNORMAL HIGH (ref 0.450–4.500)

## 2023-11-19 ENCOUNTER — Telehealth: Payer: Self-pay | Admitting: *Deleted

## 2023-11-19 ENCOUNTER — Encounter: Payer: Self-pay | Admitting: Neurology

## 2023-11-19 NOTE — Telephone Encounter (Signed)
 [facility] [employer] ( [place])Eculizumab  (SOLIRIS  IV) Patient said Dr. Vear was switching to Ultormis following up on the order to switch from Solioris to Ultomiris. Would like a call back. Contact information: [phone]

## 2023-11-19 NOTE — Telephone Encounter (Signed)
 Called pt and relayed results per Dr. Vear. She verbalized understanding. Aware we have sent things to [facility] and [facility] to help transition her to Ultomiris.   Per Dr. Vear, she should go ahead and do next Soliris  infusion until approved for Ultomiris. She verbalized understanding.

## 2023-11-19 NOTE — Telephone Encounter (Signed)
 Faxed vaccine order form below to [employer]. Received fax confirmation. Also emailed Gabrielle/[facility] to let her know Dr. Vear wants her to get this first before starting on Ultomiris.

## 2023-11-19 NOTE — Telephone Encounter (Addendum)
 [facility] needed proof of meningitis vaccination. I reviewed chart and see pt [facility] to [facility] [place]. I called them at [phone] and spoke w/ Oneil. He faxed vaccination records to us  at [phone]. See below.

## 2023-11-19 NOTE — Telephone Encounter (Signed)
 Error

## 2023-11-19 NOTE — Telephone Encounter (Signed)
 Faxed Ultomiris start form to [employer] at [phone]. Received fax confirmation.   Faxed Ultomiris order to [facility] infusion at [phone]. Received fax confirmation. Sent email to rep/Gabrielle letting her know things have been faxed over.

## 2023-11-19 NOTE — Telephone Encounter (Signed)
 Called [employer] back at number below. Spoke w/ Loran. Case manager for Ultomiris. Confirmed she is changing to Ultomiris and start form/order faxed in. States since last MenACWY 09/2018 she would be due for booster since it is recommended every 5 yr. I spoke w/ Dr. Vear who recommends she receive MenACWY booster before Ultomiris. Aware we will send them prescriber vaccination order for them to coordinate. They can send nurse to home to help vaccinate her.

## 2023-11-24 DIAGNOSIS — M329 Systemic lupus erythematosus, unspecified: Secondary | ICD-10-CM | POA: Diagnosis not present

## 2023-11-24 DIAGNOSIS — M549 Dorsalgia, unspecified: Secondary | ICD-10-CM | POA: Diagnosis not present

## 2023-11-24 DIAGNOSIS — G360 Neuromyelitis optica [Devic]: Secondary | ICD-10-CM | POA: Diagnosis not present

## 2023-11-24 DIAGNOSIS — M25551 Pain in right hip: Secondary | ICD-10-CM | POA: Diagnosis not present

## 2023-11-24 DIAGNOSIS — Z79891 Long term (current) use of opiate analgesic: Secondary | ICD-10-CM | POA: Diagnosis not present

## 2023-11-24 DIAGNOSIS — K5903 Drug induced constipation: Secondary | ICD-10-CM | POA: Diagnosis not present

## 2023-11-24 DIAGNOSIS — M542 Cervicalgia: Secondary | ICD-10-CM | POA: Diagnosis not present

## 2023-11-24 DIAGNOSIS — M25511 Pain in right shoulder: Secondary | ICD-10-CM | POA: Diagnosis not present

## 2023-11-24 DIAGNOSIS — G43809 Other migraine, not intractable, without status migrainosus: Secondary | ICD-10-CM | POA: Diagnosis not present

## 2023-11-24 DIAGNOSIS — M25512 Pain in left shoulder: Secondary | ICD-10-CM | POA: Diagnosis not present

## 2023-11-24 DIAGNOSIS — M25552 Pain in left hip: Secondary | ICD-10-CM | POA: Diagnosis not present

## 2023-11-24 DIAGNOSIS — G894 Chronic pain syndrome: Secondary | ICD-10-CM | POA: Diagnosis not present

## 2023-11-25 ENCOUNTER — Other Ambulatory Visit (HOSPITAL_COMMUNITY): Payer: Self-pay

## 2023-11-25 ENCOUNTER — Telehealth: Payer: Self-pay

## 2023-11-25 NOTE — Telephone Encounter (Signed)
 Faxed signed order below to [facility] at [phone]. Received fax confirmation. Replied to [facility], letting her know order faxed back.  Received email 11/24/23:  Attached is the updated order for Dr. Vear reflecting the change to Ultomiris for patient FC. At your convenience, could you please have Dr. Vear review, sign, and fax it back to our clinic at [phone]? Please let me know if you have any questions.  Thank you so much, Marry Kerns Saveljic  Specialty Account Manager  [place]  Phone: [phone] [facility]

## 2023-11-25 NOTE — Telephone Encounter (Signed)
 Received fax from [facility] pt Medicare Part D plan has Ultomiris listed as non-formulary and being reviewed for coverage. Also received fax from [employer] asking for further info for coverage. I sent to [facility] asking if we needed to do anything since they are handling the auth.  I received the following response from Gabrielle/[facility]: Thank you for sharing this information. I've shared your copy with our team. At this time, no further action is needed on your end. We have received the medication approval via fax today for the patient's Ultomiris. I'll be sure to reach out if anything comes up or if we need additional details.

## 2023-11-25 NOTE — Telephone Encounter (Signed)
*  [facility]  Pharmacy Patient Advocate Encounter   Received notification from CoverMyMeds that prior authorization for Darifenacin  Hydrobromide ER 15MG  er tablets  is required/requested.   Insurance verification completed.   The patient is insured through [facility] .   Per test claim:  Myrbetriq  is preferred by the insurance.  If suggested medication is appropriate, Please send in a new RX and discontinue this one. If not, please advise as to why it's not appropriate so that we may request a Prior Authorization. Please note, some preferred medications may still require a PA.  If the suggested medications have not been trialed and there are no contraindications to their use, the PA will not be submitted, as it will not be approved.   CMM Key: [id number]  Brand Myrbetriq  via test claim- $0.00/30 days

## 2023-11-25 NOTE — Telephone Encounter (Signed)
 Susan Hodge

## 2023-11-26 MED ORDER — MIRABEGRON ER 50 MG PO TB24: 50.0000 mg | ORAL_TABLET | Freq: Every day | ORAL | 11 refills | Status: DC

## 2023-11-26 NOTE — Telephone Encounter (Signed)
Patient informed, Rx sent.  

## 2023-11-26 NOTE — Telephone Encounter (Addendum)
 Called pt to relay, however voicemail is full, was not able to leave a message to relay the below,

## 2023-12-15 ENCOUNTER — Encounter (INDEPENDENT_AMBULATORY_CARE_PROVIDER_SITE_OTHER): Payer: Self-pay | Admitting: *Deleted

## 2023-12-15 NOTE — Telephone Encounter (Signed)
 Per Gabrielle/[facility] 12/15/23, 1506:  I wanted to give you an update on Mrs. Susan Hodge. She was due for her last Soliris  dose on 12/11/23, but reported feeling ill and believed to have a UTI. She has an appointment with her PCP this Thursday for antibiotics, as she couldn't be seen sooner. After consulting with our nursing and pharmacy teams, we decided to reschedule her infusion for three days after she starts treatment for her UTI. I'll keep you updated but wanted to make you aware.

## 2023-12-15 NOTE — Telephone Encounter (Signed)
 Per Gabrielle/[facility] 12/15/23 at 1521: Her appointment is set for Thursday, so if she's able to start antibiotics that day, we'll plan to move her treatment to Sunday or Monday as appropriate. I'll be sure to confirm the new date with you once it's officially scheduled.

## 2023-12-21 NOTE — Telephone Encounter (Signed)
 Updated from 12/18/23 1342: We confirmed with the patient that she has started her antibiotics for her UTI, and together decided to reschedule her final Soliris  infusion for 8/22. Marry Kerns Saveljic/Specialty Account Manager/Phone: [phone]/[facility]

## 2023-12-31 ENCOUNTER — Ambulatory Visit: Payer: Medicare HMO | Admitting: Neurology

## 2024-01-08 DIAGNOSIS — G360 Neuromyelitis optica [Devic]: Secondary | ICD-10-CM | POA: Diagnosis not present

## 2024-01-19 DIAGNOSIS — M25511 Pain in right shoulder: Secondary | ICD-10-CM | POA: Diagnosis not present

## 2024-01-19 DIAGNOSIS — M542 Cervicalgia: Secondary | ICD-10-CM | POA: Diagnosis not present

## 2024-01-19 DIAGNOSIS — M25551 Pain in right hip: Secondary | ICD-10-CM | POA: Diagnosis not present

## 2024-01-19 DIAGNOSIS — M329 Systemic lupus erythematosus, unspecified: Secondary | ICD-10-CM | POA: Diagnosis not present

## 2024-01-19 DIAGNOSIS — G43809 Other migraine, not intractable, without status migrainosus: Secondary | ICD-10-CM | POA: Diagnosis not present

## 2024-01-19 DIAGNOSIS — K5903 Drug induced constipation: Secondary | ICD-10-CM | POA: Diagnosis not present

## 2024-01-19 DIAGNOSIS — G894 Chronic pain syndrome: Secondary | ICD-10-CM | POA: Diagnosis not present

## 2024-01-19 DIAGNOSIS — G360 Neuromyelitis optica [Devic]: Secondary | ICD-10-CM | POA: Diagnosis not present

## 2024-01-19 DIAGNOSIS — M549 Dorsalgia, unspecified: Secondary | ICD-10-CM | POA: Diagnosis not present

## 2024-01-19 DIAGNOSIS — M25512 Pain in left shoulder: Secondary | ICD-10-CM | POA: Diagnosis not present

## 2024-01-19 DIAGNOSIS — M25552 Pain in left hip: Secondary | ICD-10-CM | POA: Diagnosis not present

## 2024-01-19 DIAGNOSIS — Z79891 Long term (current) use of opiate analgesic: Secondary | ICD-10-CM | POA: Diagnosis not present

## 2024-02-15 ENCOUNTER — Emergency Department (HOSPITAL_COMMUNITY)

## 2024-02-15 ENCOUNTER — Inpatient Hospital Stay (HOSPITAL_COMMUNITY)
Admission: EM | Admit: 2024-02-15 | Discharge: 2024-02-18 | DRG: 871 | Disposition: A | Discharge status: Home Health | Attending: Family Medicine | Admitting: Family Medicine

## 2024-02-15 ENCOUNTER — Encounter (HOSPITAL_COMMUNITY): Payer: Self-pay

## 2024-02-15 ENCOUNTER — Other Ambulatory Visit: Payer: Self-pay

## 2024-02-15 DIAGNOSIS — Z79899 Other long term (current) drug therapy: Secondary | ICD-10-CM | POA: Diagnosis not present

## 2024-02-15 DIAGNOSIS — J439 Emphysema, unspecified: Secondary | ICD-10-CM | POA: Diagnosis not present

## 2024-02-15 DIAGNOSIS — R5383 Other fatigue: Secondary | ICD-10-CM | POA: Diagnosis not present

## 2024-02-15 DIAGNOSIS — Z8719 Personal history of other diseases of the digestive system: Secondary | ICD-10-CM

## 2024-02-15 DIAGNOSIS — G9341 Metabolic encephalopathy: Secondary | ICD-10-CM | POA: Diagnosis present

## 2024-02-15 DIAGNOSIS — E876 Hypokalemia: Secondary | ICD-10-CM | POA: Diagnosis not present

## 2024-02-15 DIAGNOSIS — Z886 Allergy status to analgesic agent status: Secondary | ICD-10-CM

## 2024-02-15 DIAGNOSIS — Z1152 Encounter for screening for COVID-19: Secondary | ICD-10-CM | POA: Diagnosis not present

## 2024-02-15 DIAGNOSIS — A419 Sepsis, unspecified organism: Secondary | ICD-10-CM | POA: Diagnosis not present

## 2024-02-15 DIAGNOSIS — R6520 Severe sepsis without septic shock: Secondary | ICD-10-CM | POA: Diagnosis present

## 2024-02-15 DIAGNOSIS — Z91030 Bee allergy status: Secondary | ICD-10-CM

## 2024-02-15 DIAGNOSIS — Z87891 Personal history of nicotine dependence: Secondary | ICD-10-CM | POA: Diagnosis not present

## 2024-02-15 DIAGNOSIS — M329 Systemic lupus erythematosus, unspecified: Secondary | ICD-10-CM | POA: Diagnosis present

## 2024-02-15 DIAGNOSIS — G8194 Hemiplegia, unspecified affecting left nondominant side: Secondary | ICD-10-CM | POA: Diagnosis not present

## 2024-02-15 DIAGNOSIS — K219 Gastro-esophageal reflux disease without esophagitis: Secondary | ICD-10-CM | POA: Diagnosis present

## 2024-02-15 DIAGNOSIS — N319 Neuromuscular dysfunction of bladder, unspecified: Secondary | ICD-10-CM | POA: Diagnosis not present

## 2024-02-15 DIAGNOSIS — Z888 Allergy status to other drugs, medicaments and biological substances status: Secondary | ICD-10-CM

## 2024-02-15 DIAGNOSIS — R109 Unspecified abdominal pain: Secondary | ICD-10-CM | POA: Diagnosis not present

## 2024-02-15 DIAGNOSIS — G9389 Other specified disorders of brain: Secondary | ICD-10-CM | POA: Diagnosis not present

## 2024-02-15 DIAGNOSIS — Z885 Allergy status to narcotic agent status: Secondary | ICD-10-CM

## 2024-02-15 DIAGNOSIS — I10 Essential (primary) hypertension: Secondary | ICD-10-CM | POA: Diagnosis not present

## 2024-02-15 DIAGNOSIS — J189 Pneumonia, unspecified organism: Principal | ICD-10-CM | POA: Diagnosis present

## 2024-02-15 DIAGNOSIS — G43909 Migraine, unspecified, not intractable, without status migrainosus: Secondary | ICD-10-CM | POA: Diagnosis present

## 2024-02-15 DIAGNOSIS — Z9049 Acquired absence of other specified parts of digestive tract: Secondary | ICD-10-CM

## 2024-02-15 DIAGNOSIS — R61 Generalized hyperhidrosis: Secondary | ICD-10-CM | POA: Diagnosis not present

## 2024-02-15 DIAGNOSIS — M797 Fibromyalgia: Secondary | ICD-10-CM | POA: Diagnosis not present

## 2024-02-15 DIAGNOSIS — Z8673 Personal history of transient ischemic attack (TIA), and cerebral infarction without residual deficits: Secondary | ICD-10-CM

## 2024-02-15 DIAGNOSIS — G360 Neuromyelitis optica [Devic]: Secondary | ICD-10-CM | POA: Diagnosis not present

## 2024-02-15 DIAGNOSIS — J9601 Acute respiratory failure with hypoxia: Secondary | ICD-10-CM | POA: Diagnosis present

## 2024-02-15 DIAGNOSIS — M3214 Glomerular disease in systemic lupus erythematosus: Secondary | ICD-10-CM | POA: Diagnosis not present

## 2024-02-15 DIAGNOSIS — T502X5A Adverse effect of carbonic-anhydrase inhibitors, benzothiadiazides and other diuretics, initial encounter: Secondary | ICD-10-CM | POA: Diagnosis not present

## 2024-02-15 DIAGNOSIS — R519 Headache, unspecified: Secondary | ICD-10-CM | POA: Diagnosis not present

## 2024-02-15 DIAGNOSIS — R059 Cough, unspecified: Secondary | ICD-10-CM | POA: Diagnosis not present

## 2024-02-15 DIAGNOSIS — E039 Hypothyroidism, unspecified: Secondary | ICD-10-CM | POA: Diagnosis not present

## 2024-02-15 DIAGNOSIS — R509 Fever, unspecified: Secondary | ICD-10-CM | POA: Diagnosis not present

## 2024-02-15 DIAGNOSIS — K592 Neurogenic bowel, not elsewhere classified: Secondary | ICD-10-CM | POA: Diagnosis not present

## 2024-02-15 DIAGNOSIS — G894 Chronic pain syndrome: Secondary | ICD-10-CM | POA: Diagnosis not present

## 2024-02-15 DIAGNOSIS — R748 Abnormal levels of other serum enzymes: Secondary | ICD-10-CM | POA: Diagnosis not present

## 2024-02-15 DIAGNOSIS — R918 Other nonspecific abnormal finding of lung field: Secondary | ICD-10-CM | POA: Diagnosis not present

## 2024-02-15 DIAGNOSIS — R531 Weakness: Secondary | ICD-10-CM | POA: Diagnosis not present

## 2024-02-15 DIAGNOSIS — R4182 Altered mental status, unspecified: Secondary | ICD-10-CM | POA: Diagnosis not present

## 2024-02-15 LAB — COMPREHENSIVE METABOLIC PANEL WITH GFR
ALT: 11 U/L (ref 0–44)
AST: 16 U/L (ref 15–41)
Albumin: 2.9 g/dL — ABNORMAL LOW (ref 3.5–5.0)
Alkaline Phosphatase: 227 U/L — ABNORMAL HIGH (ref 38–126)
Anion gap: 11 (ref 5–15)
BUN: 9 mg/dL (ref 8–23)
CO2: 24 mmol/L (ref 22–32)
Calcium: 8.6 mg/dL — ABNORMAL LOW (ref 8.9–10.3)
Chloride: 97 mmol/L — ABNORMAL LOW (ref 98–111)
Creatinine, Ser: 0.78 mg/dL (ref 0.44–1.00)
GFR, Estimated: >60 mL/min (ref >60)
Glucose, Bld: 100 mg/dL — ABNORMAL HIGH (ref 70–99)
Potassium: 3.5 mmol/L (ref 3.5–5.1)
Sodium: 131 mmol/L — ABNORMAL LOW (ref 135–145)
Total Bilirubin: 0.6 mg/dL (ref 0.0–1.2)
Total Protein: 6.7 g/dL (ref 6.5–8.1)

## 2024-02-15 LAB — URINALYSIS, W/ REFLEX TO CULTURE (INFECTION SUSPECTED)
Bilirubin Urine: NEGATIVE
Glucose, UA: NEGATIVE mg/dL
Hgb urine dipstick: NEGATIVE
Ketones, ur: 5 mg/dL — AB
Leukocytes,Ua: NEGATIVE
Nitrite: NEGATIVE
Protein, ur: NEGATIVE mg/dL
Specific Gravity, Urine: 1.01 (ref 1.005–1.030)
pH: 7 (ref 5.0–8.0)

## 2024-02-15 LAB — MAGNESIUM: Magnesium: 1.7 mg/dL (ref 1.7–2.4)

## 2024-02-15 LAB — LACTIC ACID, PLASMA
Lactic Acid, Venous: 1 mmol/L (ref 0.5–1.9)
Lactic Acid, Venous: 1 mmol/L (ref 0.5–1.9)

## 2024-02-15 LAB — CBC WITH DIFFERENTIAL/PLATELET
Abs Immature Granulocytes: 0.04 K/uL (ref 0.00–0.07)
Basophils Absolute: 0 K/uL (ref 0.0–0.1)
Basophils Relative: 0 %
Eosinophils Absolute: 0 K/uL (ref 0.0–0.5)
Eosinophils Relative: 0 %
HCT: 31.2 % — ABNORMAL LOW (ref 36.0–46.0)
Hemoglobin: 10.6 g/dL — ABNORMAL LOW (ref 12.0–15.0)
Immature Granulocytes: 1 %
Lymphocytes Relative: 8 %
Lymphs Abs: 0.6 K/uL — ABNORMAL LOW (ref 0.7–4.0)
MCH: 30.5 pg (ref 26.0–34.0)
MCHC: 34 g/dL (ref 30.0–36.0)
MCV: 89.9 fL (ref 80.0–100.0)
Monocytes Absolute: 0.9 K/uL (ref 0.1–1.0)
Monocytes Relative: 11 %
Neutro Abs: 6.8 K/uL (ref 1.7–7.7)
Neutrophils Relative %: 80 %
Platelets: 145 K/uL — ABNORMAL LOW (ref 150–400)
RBC: 3.47 MIL/uL — ABNORMAL LOW (ref 3.87–5.11)
RDW: 13.8 % (ref 11.5–15.5)
WBC: 8.4 K/uL (ref 4.0–10.5)
nRBC: 0 % (ref 0.0–0.2)

## 2024-02-15 LAB — PRO BRAIN NATRIURETIC PEPTIDE: Pro Brain Natriuretic Peptide: 947 pg/mL — ABNORMAL HIGH (ref <300.0)

## 2024-02-15 LAB — RESP PANEL BY RT-PCR (RSV, FLU A&B, COVID)  RVPGX2
Influenza A by PCR: NEGATIVE
Influenza B by PCR: NEGATIVE
Resp Syncytial Virus by PCR: NEGATIVE
SARS Coronavirus 2 by RT PCR: NEGATIVE

## 2024-02-15 LAB — LIPASE, BLOOD: Lipase: <10 U/L — ABNORMAL LOW (ref 11–51)

## 2024-02-15 LAB — PROTIME-INR
INR: 1.1 (ref 0.8–1.2)
Prothrombin Time: 14.6 s (ref 11.4–15.2)

## 2024-02-15 MED ORDER — SODIUM CHLORIDE 0.9 % IV SOLN
500.0000 mg | Freq: Once | INTRAVENOUS | Status: AC
  Administered 2024-02-15: 500 mg via INTRAVENOUS
  Filled 2024-02-15: qty 5

## 2024-02-15 MED ORDER — LACTATED RINGERS IV BOLUS (SEPSIS)
500.0000 mL | Freq: Once | INTRAVENOUS | Status: AC
  Administered 2024-02-15: 500 mL via INTRAVENOUS

## 2024-02-15 MED ORDER — ENOXAPARIN SODIUM 40 MG/0.4ML IJ SOSY
40.0000 mg | PREFILLED_SYRINGE | INTRAMUSCULAR | Status: DC
Start: 2024-02-15 — End: 2024-02-18
  Administered 2024-02-15 – 2024-02-17 (×3): 40 mg via SUBCUTANEOUS
  Filled 2024-02-15 (×3): qty 0.4

## 2024-02-15 MED ORDER — GUAIFENESIN-DM 100-10 MG/5ML PO SYRP: 15.0000 mL | ORAL_SOLUTION | Freq: Three times a day (TID) | ORAL | Status: AC

## 2024-02-15 MED ORDER — ONDANSETRON HCL 4 MG/2ML IJ SOLN: 4.0000 mg | Freq: Four times a day (QID) | INTRAMUSCULAR | Status: DC | PRN

## 2024-02-15 MED ORDER — POLYETHYLENE GLYCOL 3350 17 G PO PACK: 17.0000 g | PACK | Freq: Every day | ORAL | Status: DC | PRN

## 2024-02-15 MED ORDER — ACETAMINOPHEN 650 MG RE SUPP: 650.0000 mg | Freq: Four times a day (QID) | RECTAL | Status: DC | PRN

## 2024-02-15 MED ORDER — LACTATED RINGERS IV SOLN: INTRAVENOUS | Status: DC

## 2024-02-15 MED ORDER — LACTATED RINGERS IV BOLUS (SEPSIS)
250.0000 mL | Freq: Once | INTRAVENOUS | Status: AC
  Administered 2024-02-15: 250 mL via INTRAVENOUS

## 2024-02-15 MED ORDER — ALPRAZOLAM 0.5 MG PO TABS
0.5000 mg | ORAL_TABLET | Freq: Three times a day (TID) | ORAL | Status: DC | PRN
  Administered 2024-02-15 – 2024-02-18 (×8): 0.5 mg via ORAL
  Filled 2024-02-15 (×8): qty 1

## 2024-02-15 MED ORDER — ACETAMINOPHEN 325 MG PO TABS
650.0000 mg | ORAL_TABLET | Freq: Four times a day (QID) | ORAL | Status: DC | PRN
  Administered 2024-02-16 – 2024-02-18 (×4): 650 mg via ORAL
  Filled 2024-02-15 (×4): qty 2

## 2024-02-15 MED ORDER — PANTOPRAZOLE SODIUM 40 MG PO TBEC
40.0000 mg | DELAYED_RELEASE_TABLET | Freq: Two times a day (BID) | ORAL | Status: DC
  Administered 2024-02-16 – 2024-02-18 (×5): 40 mg via ORAL
  Filled 2024-02-15 (×5): qty 1

## 2024-02-15 MED ORDER — IPRATROPIUM-ALBUTEROL 0.5-2.5 (3) MG/3ML IN SOLN
3.0000 mL | Freq: Once | RESPIRATORY_TRACT | Status: AC
  Administered 2024-02-15: 3 mL via RESPIRATORY_TRACT
  Filled 2024-02-15: qty 3

## 2024-02-15 MED ORDER — VANCOMYCIN HCL 1500 MG/300ML IV SOLN
1500.0000 mg | Freq: Once | INTRAVENOUS | Status: AC
  Administered 2024-02-15: 1500 mg via INTRAVENOUS
  Filled 2024-02-15: qty 300

## 2024-02-15 MED ORDER — LACTATED RINGERS IV BOLUS (SEPSIS)
1000.0000 mL | Freq: Once | INTRAVENOUS | Status: AC
  Administered 2024-02-15: 1000 mL via INTRAVENOUS

## 2024-02-15 MED ORDER — SODIUM CHLORIDE 0.9 % IV SOLN
500.0000 mg | INTRAVENOUS | Status: DC
  Administered 2024-02-16 – 2024-02-17 (×2): 500 mg via INTRAVENOUS
  Filled 2024-02-15 (×2): qty 5

## 2024-02-15 MED ORDER — OXYCODONE-ACETAMINOPHEN 10-325 MG PO TABS: 1.0000 | ORAL_TABLET | Freq: Three times a day (TID) | ORAL | Status: DC | PRN

## 2024-02-15 MED ORDER — ACETAMINOPHEN 650 MG RE SUPP
650.0000 mg | Freq: Once | RECTAL | Status: AC
  Administered 2024-02-15: 650 mg via RECTAL
  Filled 2024-02-15: qty 1

## 2024-02-15 MED ORDER — IPRATROPIUM-ALBUTEROL 0.5-2.5 (3) MG/3ML IN SOLN
3.0000 mL | RESPIRATORY_TRACT | Status: DC | PRN
  Administered 2024-02-15 – 2024-02-16 (×2): 3 mL via RESPIRATORY_TRACT
  Filled 2024-02-15 (×2): qty 3

## 2024-02-15 MED ORDER — SODIUM CHLORIDE 0.9 % IV SOLN: INTRAVENOUS | Status: AC

## 2024-02-15 MED ORDER — VANCOMYCIN HCL IN DEXTROSE 1-5 GM/200ML-% IV SOLN: 1000.0000 mg | Freq: Once | INTRAVENOUS | Status: DC

## 2024-02-15 MED ORDER — OXYCODONE HCL 5 MG PO TABS
10.0000 mg | ORAL_TABLET | Freq: Three times a day (TID) | ORAL | Status: DC | PRN
  Administered 2024-02-15 – 2024-02-18 (×9): 10 mg via ORAL
  Filled 2024-02-15 (×9): qty 2

## 2024-02-15 MED ORDER — ONDANSETRON HCL 4 MG PO TABS: 4.0000 mg | ORAL_TABLET | Freq: Four times a day (QID) | ORAL | Status: DC | PRN

## 2024-02-15 MED ORDER — METRONIDAZOLE 500 MG/100ML IV SOLN
500.0000 mg | Freq: Once | INTRAVENOUS | Status: AC
  Administered 2024-02-15: 500 mg via INTRAVENOUS
  Filled 2024-02-15: qty 100

## 2024-02-15 MED ORDER — IPRATROPIUM-ALBUTEROL 0.5-2.5 (3) MG/3ML IN SOLN
3.0000 mL | Freq: Three times a day (TID) | RESPIRATORY_TRACT | Status: DC
  Administered 2024-02-16 (×3): 3 mL via RESPIRATORY_TRACT
  Filled 2024-02-15 (×6): qty 3

## 2024-02-15 MED ORDER — ACETAMINOPHEN 325 MG PO TABS
325.0000 mg | ORAL_TABLET | Freq: Three times a day (TID) | ORAL | Status: DC | PRN
  Administered 2024-02-17 – 2024-02-18 (×4): 325 mg via ORAL
  Filled 2024-02-15 (×4): qty 1

## 2024-02-15 MED ORDER — IOHEXOL 350 MG/ML SOLN
80.0000 mL | Freq: Once | INTRAVENOUS | Status: AC | PRN
Start: 2024-02-15 — End: 2024-02-15
  Administered 2024-02-15: 80 mL via INTRAVENOUS

## 2024-02-15 MED ORDER — SODIUM CHLORIDE 0.9 % IV SOLN
2.0000 g | INTRAVENOUS | Status: DC
  Administered 2024-02-16 – 2024-02-18 (×3): 2 g via INTRAVENOUS
  Filled 2024-02-15 (×3): qty 20

## 2024-02-15 MED ORDER — SODIUM CHLORIDE 0.9 % IV SOLN
2.0000 g | Freq: Once | INTRAVENOUS | Status: AC
  Administered 2024-02-15: 2 g via INTRAVENOUS
  Filled 2024-02-15: qty 12.5

## 2024-02-15 NOTE — Sepsis Progress Note (Signed)
 Code sepsis protocol being monitored by [employer].

## 2024-02-15 NOTE — H&P (Addendum)
 History and Physical    Susan Hodge FMW:[id number] DOB: November 08, 1950 DOA: 02/15/2024  PCP: Maree Isles, MD  Patient coming from: Home  I have personally briefly reviewed patient's old medical records in [facility]  Chief Complaint: Difficulty breathing, altered mental status  HPI: Susan Hodge is a 73 y.o. female with medical history significant for neurogenic bladder, fibromyalgia, migraines, left hemiplegia, anxiety and depression, chronic pain. Patient was brought to the ED by EMS with reports of weakness, cough of 3 days duration, family also noted confusion and lethargy.  At time of my evaluation, patient is quite lethargic, appears quite weak but follows directions, answers some questions.    ED Course: O2 sats dropping to low 80s on room air, placed on 2 L.  Tmax 100.1.  Heart rate 77 -102.  Respiratory rate 19-30.  Blood pressure systolic 104-114.  ALP elevated at 227 but other liver enzymes normal.  WBC 8.4.  proBNP 947.  Lactic acid 1 x 2.  COVID influenza RSV negative.  Chest x-ray suggesting right middle and lower lobe pneumonia. CTA chest negative for PE, confirms right middle and right lower lobe consolidation also suggest multilobar pneumonia. CTAP WC possible mild wall thickening of the ascending and proximal transverse colon, potentially due to underdistention or mild colitis.  No evidence of bowel obstruction or perforation. Head CT negative for acute abnormality, shows generalized atrophy. Patient is on broad-spectrum antibiotics IV vancomycin and cefepime and metronidazole, azithromycin later added.  1.7 to 5 L LR bolus given, and continued at 150 cc/h.   Review of Systems: As per HPI all other systems reviewed and negative.  Past Medical History:  Diagnosis Date   Chest pain    Collagen vascular disease    Complication of anesthesia    Blisters to mouth.   Depression    DJD (degenerative joint disease)    FH: CVA (cerebrovascular accident)    father    Fibromyalgia    GERD (gastroesophageal reflux disease)    Hypothyroidism    Lupus    Migraines    Palpitations    Status post cholecystectomy    appendectomy and bilaateral oophorectomy   Tobacco use     Past Surgical History:  Procedure Laterality Date   BILATERAL OOPHORECTOMY     CARDIAC CATHETERIZATION     CHOLECYSTECTOMY     COLONOSCOPY WITH PROPOFOL  N/A 01/03/2019   Procedure: COLONOSCOPY WITH PROPOFOL ;  Surgeon: Golda Claudis PENNER, MD;  Location: [facility];  Service: Endoscopy;  Laterality: N/A;   ESOPHAGOGASTRODUODENOSCOPY (EGD) WITH PROPOFOL  N/A 01/02/2019   Procedure: ESOPHAGOGASTRODUODENOSCOPY (EGD) WITH PROPOFOL ;  Surgeon: Golda Claudis PENNER, MD;  Location: [facility];  Service: Endoscopy;  Laterality: N/A;   POLYPECTOMY  01/03/2019   Procedure: POLYPECTOMY;  Surgeon: Golda Claudis PENNER, MD;  Location: [facility];  Service: Endoscopy;;     reports that she has been smoking cigarettes. She has never used smokeless tobacco. She reports that she does not drink alcohol  and does not use drugs.  Allergies  Allergen Reactions   Prednisone  Other (See Comments)    Not to be given at any point while patient is still taking Soliris  infusions.  Severe shaking and dehydration.   Amitriptyline Other (See Comments)    Psychosis   Amitriptyline Hcl Other (See Comments)    Unknown    Bee Venom Other (See Comments)    Unknown    Fexofenadine Other (See Comments)    Unknown    Furosemide Other (  See Comments)    Unknown   Gabapentin Other (See Comments)    Unknown   Hydroxychloroquine Sulfate Other (See Comments)    Unknown    Ibuprofen Other (See Comments)    Unknown    Morphine Other (See Comments)    Unknown    Nabumetone Other (See Comments)    Unknown    Nsaids Other (See Comments)    Unknown    Tizanidine  Other (See Comments)    Unknown    Topiramate Other (See Comments)    Unknown    Cymbalta [Duloxetine Hcl] Nausea Only and Other (See Comments)     Constipation    Oxycodone  Other (See Comments)    Dizzy     Family History  Problem Relation Age of Onset   Stroke Father     Prior to Admission medications   Medication Sig Start Date End Date Taking? Authorizing Provider  acetaminophen  (TYLENOL ) 325 MG tablet Take 2 tablets (650 mg total) by mouth every 6 (six) hours as needed for mild pain (or Fever >/= 101). Patient not taking: Reported on 11/12/2023 08/18/18   Angiulli, Toribio PARAS, PA-C  ALPRAZolam  (XANAX ) 0.5 MG tablet Take 1 tablet (0.5 mg total) by mouth 3 (three) times daily as needed for anxiety or sleep. 01/03/19   Pearlean Manus, MD  amoxicillin (AMOXIL) 250 MG capsule Take 250 mg by mouth 3 (three) times daily as needed (for ear infection/inflammation).  Patient not taking: Reported on 11/12/2023    [provider]  baclofen  (LIORESAL ) 10 MG tablet Take one pill po tid 12/02/22   Sater, Charlie LABOR, MD  butalbital-acetaminophen -caffeine (FIORICET) 50-325-40 MG tablet Take 1 tablet by mouth every 12 (twelve) hours as needed for headache or migraine. 02/05/24   [provider]  Cyanocobalamin  (VITAMIN B-12 PO) Take 2 tablets by mouth daily.    [provider]  DULoxetine (CYMBALTA) 20 MG capsule duloxetine 20 mg capsule,delayed release  Take 1 capsule every day by oral route. Patient not taking: Reported on 11/12/2023    [provider]  hydrochlorothiazide (MICROZIDE) 12.5 MG capsule Take 12.5 mg by mouth daily. 02/02/24   [provider]  hydrOXYzine  (ATARAX ) 10 MG tablet Take 10 mg by mouth every 8 (eight) hours as needed. 02/01/24   [provider]  lidocaine  (LIDODERM ) 5 % Place 1 patch onto the skin every 12 (twelve) hours. 12/01/18   [provider]  LINZESS 145 MCG CAPS capsule Take 145 mcg by mouth every morning. 02/01/24   [provider]  loratadine  (CLARITIN ) 10 MG tablet Take 10 mg by mouth daily.    [provider]  magnesium  hydroxide (MILK OF  MAGNESIA) 400 MG/5ML suspension Take 30 mLs by mouth daily as needed for mild constipation. Patient not taking: Reported on 11/12/2023 11/28/21   Elnor Hila P, DO  mirabegron  ER (MYRBETRIQ ) 50 MG TB24 tablet Take 1 tablet (50 mg total) by mouth daily. 11/26/23   Sater, Charlie LABOR, MD  ondansetron  (ZOFRAN ) 4 MG tablet Take 1 tablet (4 mg total) by mouth every 6 (six) hours as needed for nausea. Patient not taking: Reported on 11/12/2023 01/03/19   Pearlean Manus, MD  ondansetron  (ZOFRAN ) 4 MG/2ML SOLN injection Inject 4 mg into the muscle every 6 (six) hours as needed for vomiting or nausea. 01/07/24   [provider]  oxyCODONE -acetaminophen  (PERCOCET) 10-325 MG tablet Take 1 tablet by mouth every 6 (six) hours as needed. 01/03/19   Pearlean Manus, MD  pantoprazole  (PROTONIX ) 40  MG tablet Take 1 tablet (40 mg total) by mouth 2 (two) times daily before a meal. 01/03/19   Latacha Texeira, Courage, MD  polyethylene glycol (MIRALAX ) 17 g packet Take 17 g by mouth every morning. Patient not taking: Reported on 11/12/2023 01/03/19   Pearlean Manus, MD  polyethylene glycol (MIRALAX ) 17 g packet Take 17 g by mouth daily. Patient not taking: Reported on 11/12/2023 11/28/21   Elnor Hila P, DO  potassium chloride SA (KLOR-CON) 20 MEQ tablet Take 20 mEq by mouth 2 (two) times daily.    [provider]  pregabalin  (LYRICA ) 50 MG capsule Take 50 mg by mouth 3 (three) times daily.    [provider]  SOLIRIS  300 MG/30ML SOLN injection Inject 1,200 mg into the vein every 14 (fourteen) days. 11/04/23   [provider]  ULTOMIRIS 300 MG/3ML SOLN injection  01/27/24   [provider]  UNABLE TO FIND Med Name: CBD OIL that she uses topically for pain.    [provider]  UNABLE TO FIND daily. Med Name: fiber gummies    [provider]  valproic  acid (DEPAKENE ) 250 MG capsule Take 1 capsule (250 mg total) by mouth at bedtime. 08/18/18   Angiulli, Toribio PARAS, PA-C  Vitamin  D, Ergocalciferol , (DRISDOL ) 1.25 MG (50000 UT) CAPS capsule Take 1 capsule (50,000 Units total) by mouth every Monday. 08/23/18   Angiulli, Toribio PARAS, PA-C  vitamin E  1000 UNIT capsule Take 1,000 Units by mouth daily.    [provider]    Physical Exam: Vitals:   02/15/24 1508 02/15/24 1510 02/15/24 1530 02/15/24 1730  BP:   (!) 116/56 104/66  Pulse: 97 98 95 84  Resp: (!) 30 (!) 24 20 19   Temp: 99.4 F (37.4 C) 99.4 F (37.4 C) 99.2 F (37.3 C) 97.8 F (36.6 C)  TempSrc:      SpO2: 96% 96% 96% 95%  Weight:      Height:        Constitutional: Appears generally weak and lethargic, calm, comfortable Vitals:   02/15/24 1508 02/15/24 1510 02/15/24 1530 02/15/24 1730  BP:   (!) 116/56 104/66  Pulse: 97 98 95 84  Resp: (!) 30 (!) 24 20 19   Temp: 99.4 F (37.4 C) 99.4 F (37.4 C) 99.2 F (37.3 C) 97.8 F (36.6 C)  TempSrc:      SpO2: 96% 96% 96% 95%  Weight:      Height:       Eyes: Pupils equal, lids and conjunctivae normal ENMT: Mucous membranes are dry.   Neck: normal, supple, no masses, no thyromegaly Respiratory: Coughing, lungs clear to auscultation bilaterally, no wheezing, no crackles. Normal respiratory effort. No accessory muscle use.  Cardiovascular: Regular rate and rhythm, no murmurs / rubs / gallops. No extremity edema.  Extremities warm. Abdomen: Mild to moderate tenderness on palpation, no masses palpated. No hepatosplenomegaly. Bowel sounds positive.  Musculoskeletal: no clubbing / cyanosis. No joint deformity upper and lower extremities. Skin: no rashes, lesions, ulcers. No induration Neurologic: No facial asymmetry, speech weak and low-volume but comprehensible, equal grip strength to bilateral upper extremities, 3/5 strength to right lower extremity, 1/5 strength to left lower extremity Psychiatric: Lethargic, but awakens, answers questions,.   Labs on Admission: I have personally reviewed following labs and imaging studies  CBC: Recent Labs   Lab 02/15/24 1350  WBC 8.4  NEUTROABS 6.8  HGB 10.6*  HCT 31.2*  MCV 89.9  PLT 145*   Basic Metabolic Panel: Recent  Labs  Lab 02/15/24 1350  NA 131*  K 3.5  CL 97*  CO2 24  GLUCOSE 100*  BUN 9  CREATININE 0.78  CALCIUM  8.6*  MG 1.7   GFR: Estimated Creatinine Clearance: 57.6 mL/min (by C-G formula based on SCr of 0.78 mg/dL). Liver Function Tests: Recent Labs  Lab 02/15/24 1350  AST 16  ALT 11  ALKPHOS 227*  BILITOT 0.6  PROT 6.7  ALBUMIN 2.9*   Recent Labs  Lab 02/15/24 1350  LIPASE <10*   No results for input(s): AMMONIA in the last 168 hours. Coagulation Profile: Recent Labs  Lab 02/15/24 1350  INR 1.1   BNP (last 3 results) Recent Labs    02/15/24 1350  PROBNP 947.0*   Urine analysis:    Component Value Date/Time   COLORURINE YELLOW 02/15/2024 1505   APPEARANCEUR CLEAR 02/15/2024 1505   APPEARANCEUR Clear 10/04/2019 1436   LABSPEC 1.010 02/15/2024 1505   PHURINE 7.0 02/15/2024 1505   GLUCOSEU NEGATIVE 02/15/2024 1505   HGBUR NEGATIVE 02/15/2024 1505   BILIRUBINUR NEGATIVE 02/15/2024 1505   BILIRUBINUR Negative 10/04/2019 1436   KETONESUR 5 (A) 02/15/2024 1505   PROTEINUR NEGATIVE 02/15/2024 1505   NITRITE NEGATIVE 02/15/2024 1505   LEUKOCYTESUR NEGATIVE 02/15/2024 1505    Radiological Exams on Admission: CT Angio Chest PE W and/or Wo Contrast Result Date: 02/15/2024 CLINICAL DATA:  Pulmonary embolism (PE) suspected, high prob; Abdominal pain, acute, nonlocalized Sepsis Lethargy, fatigue, weakness and cough.  History of lupus. EXAM: CT ANGIOGRAPHY CHEST CT ABDOMEN AND PELVIS WITH CONTRAST TECHNIQUE: Multidetector CT imaging of the chest was performed using the standard protocol during bolus administration of intravenous contrast. Multiplanar CT image reconstructions and MIPs were obtained to evaluate the vascular anatomy. Multidetector CT imaging of the abdomen and pelvis was performed using the standard protocol during bolus  administration of intravenous contrast. RADIATION DOSE REDUCTION: This exam was performed according to the departmental dose-optimization program which includes automated exposure control, adjustment of the mA and/or kV according to patient size and/or use of iterative reconstruction technique. CONTRAST:  80mL OMNIPAQUE  IOHEXOL  350 MG/ML SOLN COMPARISON:  Abdominopelvic CT 11/28/2021 and 11/13/2021. Chest radiographs 02/15/2024. No comparison chest CT. FINDINGS: CTA CHEST FINDINGS Cardiovascular: The pulmonary arteries are well opacified with contrast to the level of the segmental branches. There is no evidence of acute pulmonary embolism. There is atherosclerosis of the aorta, great vessels and coronary arteries. No acute systemic arterial abnormalities are identified. The heart size is normal. There is no pericardial effusion. Mediastinum/Nodes: Nonspecific, mildly prominent mediastinal and hilar lymph nodes, including a right hilar node measuring 1.2 cm and a subcarinal node measuring 1.3 cm on image 42/503. No axillary adenopathy. The thyroid  gland, trachea and esophagus demonstrate no significant findings. Lungs/Pleura: Pulmonary assessment mildly limited by breathing artifact. As seen on earlier radiographs, there is right middle and lower lobe consolidation with diffuse central airway thickening and patchy peribronchovascular airspace opacities in the aerated portions of both lungs. No left lung consolidation demonstrated. There is a small right pleural effusion. No pneumothorax. Mild paraseptal emphysematous changes are present in both lung apices. Musculoskeletal/Chest wall: No chest wall mass or suspicious osseous findings. CT ABDOMEN AND PELVIS FINDINGS Hepatobiliary: The liver is normal in density without suspicious focal abnormality. Status post cholecystectomy without evidence of significant biliary dilatation. Pancreas: Unchanged chronic low-density lesion within the uncinate process, measuring 1.9 x  1.1 cm, likely an incidental cystic indolent neoplasm. No pancreatic ductal dilatation or surrounding inflammation. Spleen: Normal in size  without focal abnormality. Adrenals/Urinary Tract: Both adrenal glands appear normal. No evidence of urinary tract calculus, suspicious renal lesion or hydronephrosis. A Foley catheter is in place with a small amount of air in the bladder lumen. No focal bladder abnormalities are identified. Stomach/Bowel: No enteric contrast administered. The stomach appears unremarkable for its degree of distention. No small bowel distension, wall thickening or surrounding inflammation. The proximal colon is decompressed with possible mild wall thickening of the ascending and proximal transverse colon. No other colonic wall thickening identified. There is mildly prominent stool and scattered diverticulosis in the distal colon. Vascular/Lymphatic: There are no enlarged abdominal or pelvic lymph nodes. Aortic and branch vessel atherosclerosis without evidence of aneurysm or large vessel occlusion. Reproductive: Retroverted uterus with unchanged 2.7 cm right fundal fibroid. No adnexal mass. Other: No evidence of abdominal wall mass or hernia. No ascites or pneumoperitoneum. Musculoskeletal: No acute or significant osseous findings. Convex left lumbar scoliosis with mild multilevel spondylosis. Review of the MIP images confirms the above findings. IMPRESSION: 1. No evidence of acute pulmonary embolism or other acute vascular findings in the chest. 2. Right middle and lower lobe consolidation with diffuse central airway thickening and patchy peribronchovascular airspace opacities in the aerated portions of both lungs, most consistent with multilobar pneumonia. Radiographic follow-up recommended to document clearing. 3. Nonspecific mildly prominent mediastinal and hilar lymph nodes, likely reactive. 4. Possible mild wall thickening of the ascending and proximal transverse colon, potentially due to  underdistention or mild colitis. Correlate clinically. No evidence of bowel obstruction or perforation. 5. Stable chronic low-density lesion in the uncinate process of the pancreas, likely an incidental cystic indolent neoplasm. This does not appear significantly changed from available priors dating back to 12/31/2018. Older comparison studies are unavailable. 6. Aortic Atherosclerosis (ICD10-I70.0) and Emphysema (ICD10-J43.9). Electronically Signed   By: Elsie Perone M.D.   On: 02/15/2024 17:41   CT ABDOMEN PELVIS W CONTRAST Result Date: 02/15/2024 CLINICAL DATA:  Pulmonary embolism (PE) suspected, high prob; Abdominal pain, acute, nonlocalized Sepsis Lethargy, fatigue, weakness and cough.  History of lupus. EXAM: CT ANGIOGRAPHY CHEST CT ABDOMEN AND PELVIS WITH CONTRAST TECHNIQUE: Multidetector CT imaging of the chest was performed using the standard protocol during bolus administration of intravenous contrast. Multiplanar CT image reconstructions and MIPs were obtained to evaluate the vascular anatomy. Multidetector CT imaging of the abdomen and pelvis was performed using the standard protocol during bolus administration of intravenous contrast. RADIATION DOSE REDUCTION: This exam was performed according to the departmental dose-optimization program which includes automated exposure control, adjustment of the mA and/or kV according to patient size and/or use of iterative reconstruction technique. CONTRAST:  80mL OMNIPAQUE  IOHEXOL  350 MG/ML SOLN COMPARISON:  Abdominopelvic CT 11/28/2021 and 11/13/2021. Chest radiographs 02/15/2024. No comparison chest CT. FINDINGS: CTA CHEST FINDINGS Cardiovascular: The pulmonary arteries are well opacified with contrast to the level of the segmental branches. There is no evidence of acute pulmonary embolism. There is atherosclerosis of the aorta, great vessels and coronary arteries. No acute systemic arterial abnormalities are identified. The heart size is normal. There is  no pericardial effusion. Mediastinum/Nodes: Nonspecific, mildly prominent mediastinal and hilar lymph nodes, including a right hilar node measuring 1.2 cm and a subcarinal node measuring 1.3 cm on image 42/503. No axillary adenopathy. The thyroid  gland, trachea and esophagus demonstrate no significant findings. Lungs/Pleura: Pulmonary assessment mildly limited by breathing artifact. As seen on earlier radiographs, there is right middle and lower lobe consolidation with diffuse central airway thickening and patchy peribronchovascular airspace  opacities in the aerated portions of both lungs. No left lung consolidation demonstrated. There is a small right pleural effusion. No pneumothorax. Mild paraseptal emphysematous changes are present in both lung apices. Musculoskeletal/Chest wall: No chest wall mass or suspicious osseous findings. CT ABDOMEN AND PELVIS FINDINGS Hepatobiliary: The liver is normal in density without suspicious focal abnormality. Status post cholecystectomy without evidence of significant biliary dilatation. Pancreas: Unchanged chronic low-density lesion within the uncinate process, measuring 1.9 x 1.1 cm, likely an incidental cystic indolent neoplasm. No pancreatic ductal dilatation or surrounding inflammation. Spleen: Normal in size without focal abnormality. Adrenals/Urinary Tract: Both adrenal glands appear normal. No evidence of urinary tract calculus, suspicious renal lesion or hydronephrosis. A Foley catheter is in place with a small amount of air in the bladder lumen. No focal bladder abnormalities are identified. Stomach/Bowel: No enteric contrast administered. The stomach appears unremarkable for its degree of distention. No small bowel distension, wall thickening or surrounding inflammation. The proximal colon is decompressed with possible mild wall thickening of the ascending and proximal transverse colon. No other colonic wall thickening identified. There is mildly prominent stool and  scattered diverticulosis in the distal colon. Vascular/Lymphatic: There are no enlarged abdominal or pelvic lymph nodes. Aortic and branch vessel atherosclerosis without evidence of aneurysm or large vessel occlusion. Reproductive: Retroverted uterus with unchanged 2.7 cm right fundal fibroid. No adnexal mass. Other: No evidence of abdominal wall mass or hernia. No ascites or pneumoperitoneum. Musculoskeletal: No acute or significant osseous findings. Convex left lumbar scoliosis with mild multilevel spondylosis. Review of the MIP images confirms the above findings. IMPRESSION: 1. No evidence of acute pulmonary embolism or other acute vascular findings in the chest. 2. Right middle and lower lobe consolidation with diffuse central airway thickening and patchy peribronchovascular airspace opacities in the aerated portions of both lungs, most consistent with multilobar pneumonia. Radiographic follow-up recommended to document clearing. 3. Nonspecific mildly prominent mediastinal and hilar lymph nodes, likely reactive. 4. Possible mild wall thickening of the ascending and proximal transverse colon, potentially due to underdistention or mild colitis. Correlate clinically. No evidence of bowel obstruction or perforation. 5. Stable chronic low-density lesion in the uncinate process of the pancreas, likely an incidental cystic indolent neoplasm. This does not appear significantly changed from available priors dating back to 12/31/2018. Older comparison studies are unavailable. 6. Aortic Atherosclerosis (ICD10-I70.0) and Emphysema (ICD10-J43.9). Electronically Signed   By: Elsie Perone M.D.   On: 02/15/2024 17:41   CT Head Wo Contrast Result Date: 02/15/2024 CLINICAL DATA:  Altered mental status. EXAM: CT HEAD WITHOUT CONTRAST TECHNIQUE: Contiguous axial images were obtained from the base of the skull through the vertex without intravenous contrast. RADIATION DOSE REDUCTION: This exam was performed according to the  departmental dose-optimization program which includes automated exposure control, adjustment of the mA and/or kV according to patient size and/or use of iterative reconstruction technique. COMPARISON:  None Available. FINDINGS: Brain: There is generalized cerebral atrophy with widening of the extra-axial spaces and ventricular dilatation. There are areas of decreased attenuation within the white matter tracts of the supratentorial brain, consistent with microvascular disease changes. A chronic right frontoparietal infarct is seen. Vascular: No hyperdense vessel or unexpected calcification. Skull: Normal. Negative for fracture or focal lesion. Sinuses/Orbits: There is marked severity bilateral maxillary sinus, bilateral nasal mucosal and bilateral ethmoid sinus mucosal thickening. Other: None. IMPRESSION: 1. Generalized cerebral atrophy and microvascular disease changes of the supratentorial brain. 2. Chronic right frontoparietal infarct. 3. Marked severity bilateral maxillary sinus, bilateral nasal  mucosal and bilateral ethmoid sinus disease. Electronically Signed   By: Suzen Dials M.D.   On: 02/15/2024 17:26   DG Chest Port 1 View Result Date: 02/15/2024 CLINICAL DATA:  Questionable sepsis.  Lethargy and fatigue.  Cough. EXAM: PORTABLE CHEST 1 VIEW COMPARISON:  11/28/2021. FINDINGS: Patient is rotated. Trachea is midline. Heart size within normal limits. Right middle and right lower lobe airspace opacification. Left lung is grossly clear. No left pleural fluid. There may be a small right pleural effusion. IMPRESSION: Right middle and right lower lobe pneumonia with a probable small right pleural effusion. Followup PA and lateral chest X-ray is recommended in 3-4 weeks following trial of antibiotic therapy to ensure resolution and exclude underlying malignancy. Electronically Signed   By: Newell Eke M.D.   On: 02/15/2024 15:07   EKG: Independently reviewed.  Sinus rhythm, rate 94, QTc 434.  No  significant change from prior.  Assessment/Plan Principal Problem:   Severe sepsis (HCC) Active Problems:   Acute metabolic encephalopathy   Acute hypoxic respiratory failure (HCC)   Hypothyroidism   LUPUS NEPHRITIS   Chronic pain syndrome   Neurogenic bowel   Neurogenic bladder   Left hemiplegia (HCC)  Assessment and Plan:  Severe sepsis-meeting severe sepsis criteria with tachycardia heart rate 84-102, tachypnea respiratory rate 19-30, with evidence of endorgan dysfunction, hypoxia and altered mental status.  Lactic acid normal at  1X.2.  Severe sepsis likely secondary to pneumonia.  COVID influenza RSV negative - Started on broad-spectrum antibiotics in the ED, continue with IV ceftriaxone  and azithromycin - 1.7 to 5 L LR bolus given, continue N/s 100cc/hr x 15hrs - Follow-up blood cultures - Check urine Legionella and strep antigen - Trend ALP for now, other liver enzymes unremarkable.  Pneumonia/acute hypoxic respiratory failure-O2 sats down to 80s on room air, placed on 2 L.  Likely secondary to pneumonia.  CTA chest negative for PE, shows diffuse and patchy airspace opacities, consistent with multilobar pneumonia and right middle and lower lobe consolidation. - Plan per above - Mucolytics  Acute metabolic encephalopathy-presented with confusion, lethargy.  Head CT negative for acute abnormality.  Etiology likely pneumonia and severe sepsis, possibly also polypharmacy patient is on multiple psychoactive medications-Xanax , baclofen , hydroxyzine , oxycodone , Lyrica , valproic  acid. -Initially quite lethargic, now awake, crying, anxious, asking for her pain medications. - Resume home Percocet, Xanax  0.5 - Limiting or down psychoactive medications for now  Stroke/left hemiaplasia/neurogenic bladder and bowel  History of lupus nephritis-renal function stable.  On Soliris  injections every 14 days.   Hypertension-hold HCTZ in the setting of severe sepsis or transient soft blood  pressure   DVT prophylaxis: Lovenox  Code Status: FULL Family Communication: None at bedside Disposition Plan: > 2 days Consults called: None Admission status: Into tele I certify that at the point of admission it is my clinical judgment that the patient will require inpatient hospital care spanning beyond 2 midnights from the point of admission due to high intensity of service, high risk for further deterioration and high frequency of surveillance required.    Author: Tully FORBES Carwin, MD 02/15/2024 8:26 PM  For on call review [email or web address].

## 2024-02-15 NOTE — ED Provider Notes (Signed)
 [facility] Provider Note   CSN: [id number] Arrival date & time: 02/15/24  1326     Patient presents with: Cough, Fever, and Weakness   Susan Hodge is a 73 y.o. female.   HPI Patient presents for altered mental status.  Medical history includes anxiety, depression, migraines, GERD, fibromyalgia, CVA.  She is prescribed Xanax , baclofen , Cymbalta, Percocet, Lyrica , Depakene .  She arrives via EMS from home.  Family has noted lethargy, fatigue, cough over the past 3 days.  On arrival, patient is confused and unable to provide history.    Prior to Admission medications   Medication Sig Start Date End Date Taking? Authorizing Provider  acetaminophen  (TYLENOL ) 325 MG tablet Take 2 tablets (650 mg total) by mouth every 6 (six) hours as needed for mild pain (or Fever >/= 101). Patient not taking: Reported on 11/12/2023 08/18/18   Angiulli, Toribio PARAS, PA-C  ALPRAZolam  (XANAX ) 0.5 MG tablet Take 1 tablet (0.5 mg total) by mouth 3 (three) times daily as needed for anxiety or sleep. 01/03/19   Pearlean Manus, MD  amoxicillin (AMOXIL) 250 MG capsule Take 250 mg by mouth 3 (three) times daily as needed (for ear infection/inflammation).  Patient not taking: Reported on 11/12/2023    [provider]  baclofen  (LIORESAL ) 10 MG tablet Take one pill po tid 12/02/22   Sater, Charlie LABOR, MD  butalbital-acetaminophen -caffeine (FIORICET) 50-325-40 MG tablet Take 1 tablet by mouth every 12 (twelve) hours as needed for headache or migraine. 02/05/24   [provider]  Cyanocobalamin  (VITAMIN B-12 PO) Take 2 tablets by mouth daily.    [provider]  DULoxetine (CYMBALTA) 20 MG capsule duloxetine 20 mg capsule,delayed release  Take 1 capsule every day by oral route. Patient not taking: Reported on 11/12/2023    [provider]  hydrochlorothiazide (MICROZIDE) 12.5 MG capsule Take 12.5 mg by mouth daily. 02/02/24   [provider]  hydrOXYzine  (ATARAX ) 10 MG tablet Take 10 mg by mouth every 8 (eight) hours as needed. 02/01/24   [provider]  lidocaine  (LIDODERM ) 5 % Place 1 patch onto the skin every 12 (twelve) hours. 12/01/18   [provider]  LINZESS 145 MCG CAPS capsule Take 145 mcg by mouth every morning. 02/01/24   [provider]  loratadine  (CLARITIN ) 10 MG tablet Take 10 mg by mouth daily.    [provider]  magnesium  hydroxide (MILK OF MAGNESIA) 400 MG/5ML suspension Take 30 mLs by mouth daily as needed for mild constipation. Patient not taking: Reported on 11/12/2023 11/28/21   Elnor Hila P, DO  mirabegron  ER (MYRBETRIQ ) 50 MG TB24 tablet Take 1 tablet (50 mg total) by mouth daily. 11/26/23   Sater, Charlie LABOR, MD  ondansetron  (ZOFRAN ) 4 MG tablet Take 1 tablet (4 mg total) by mouth every 6 (six) hours as needed for nausea. Patient not taking: Reported on 11/12/2023 01/03/19   Pearlean Manus, MD  ondansetron  (ZOFRAN ) 4 MG/2ML SOLN injection Inject 4 mg into the muscle every 6 (six) hours as needed for vomiting or nausea. 01/07/24   [provider]  oxyCODONE -acetaminophen  (PERCOCET) 10-325 MG tablet Take 1 tablet by mouth every 6 (six) hours as needed. 01/03/19   Pearlean Manus, MD  pantoprazole  (PROTONIX ) 40 MG tablet Take 1 tablet (40 mg total) by mouth 2 (two) times daily before a meal. 01/03/19   Emokpae, Courage, MD  polyethylene glycol (MIRALAX ) 17 g packet Take 17 g by mouth every morning.  Patient not taking: Reported on 11/12/2023 01/03/19   Pearlean Manus, MD  polyethylene glycol (MIRALAX ) 17 g packet Take 17 g by mouth daily. Patient not taking: Reported on 11/12/2023 11/28/21   Elnor Hila P, DO  potassium chloride SA (KLOR-CON) 20 MEQ tablet Take 20 mEq by mouth 2 (two) times daily.    [provider]  pregabalin  (LYRICA ) 50 MG capsule Take 50 mg by mouth 3 (three) times daily.    [provider]  SOLIRIS  300 MG/30ML SOLN  injection Inject 1,200 mg into the vein every 14 (fourteen) days. 11/04/23   [provider]  ULTOMIRIS 300 MG/3ML SOLN injection  01/27/24   [provider]  UNABLE TO FIND Med Name: CBD OIL that she uses topically for pain.    [provider]  UNABLE TO FIND daily. Med Name: fiber gummies    [provider]  valproic  acid (DEPAKENE ) 250 MG capsule Take 1 capsule (250 mg total) by mouth at bedtime. 08/18/18   Angiulli, Toribio PARAS, PA-C  Vitamin D , Ergocalciferol , (DRISDOL ) 1.25 MG (50000 UT) CAPS capsule Take 1 capsule (50,000 Units total) by mouth every Monday. 08/23/18   Angiulli, Toribio PARAS, PA-C  vitamin E  1000 UNIT capsule Take 1,000 Units by mouth daily.    [provider]    Allergies: Prednisone , Amitriptyline, Amitriptyline hcl, Bee venom, Fexofenadine, Furosemide, Gabapentin, Hydroxychloroquine sulfate, Ibuprofen, Morphine, Nabumetone, Nsaids, Tizanidine , Topiramate, Cymbalta [duloxetine hcl], and Oxycodone     Review of Systems  Unable to perform ROS: Mental status change    Updated Vital Signs BP 104/66   Pulse 84   Temp 97.8 F (36.6 C)   Resp 19   Ht 5' 3 (1.6 m)   Wt 64.9 kg   SpO2 95%   BMI 25.35 kg/m   Physical Exam Vitals and nursing note reviewed.  Constitutional:      General: She is not in acute distress.    Appearance: She is well-developed. She is ill-appearing. She is not toxic-appearing or diaphoretic.  HENT:     Head: Normocephalic and atraumatic.     Right Ear: External ear normal.     Left Ear: External ear normal.     Nose: Nose normal.     Mouth/Throat:     Mouth: Mucous membranes are moist.  Eyes:     Extraocular Movements: Extraocular movements intact.     Conjunctiva/sclera: Conjunctivae normal.  Cardiovascular:     Rate and Rhythm: Regular rhythm. Tachycardia present.     Heart sounds: No murmur heard. Pulmonary:     Effort: Pulmonary effort is normal. No respiratory distress.     Breath sounds:  Wheezing present. No rales.  Abdominal:     General: There is no distension.     Palpations: Abdomen is soft.     Tenderness: There is abdominal tenderness.  Musculoskeletal:        General: No swelling. Normal range of motion.     Cervical back: Normal range of motion and neck supple.  Skin:    General: Skin is warm and dry.     Coloration: Skin is not jaundiced or pale.  Neurological:     Mental Status: She is alert. She is disoriented.     (all labs ordered are listed, but only abnormal results are displayed) Labs Reviewed  COMPREHENSIVE METABOLIC PANEL WITH GFR - Abnormal; Notable for the following components:      Result Value   Sodium 131 (*)    Chloride 97 (*)  Glucose, Bld 100 (*)    Calcium  8.6 (*)    Albumin 2.9 (*)    Alkaline Phosphatase 227 (*)    All other components within normal limits  CBC WITH DIFFERENTIAL/PLATELET - Abnormal; Notable for the following components:   RBC 3.47 (*)    Hemoglobin 10.6 (*)    HCT 31.2 (*)    Platelets 145 (*)    Lymphs Abs 0.6 (*)    All other components within normal limits  PRO BRAIN NATRIURETIC PEPTIDE - Abnormal; Notable for the following components:   Pro Brain Natriuretic Peptide 947.0 (*)    All other components within normal limits  LIPASE, BLOOD - Abnormal; Notable for the following components:   Lipase <10 (*)    All other components within normal limits  URINALYSIS, W/ REFLEX TO CULTURE (INFECTION SUSPECTED) - Abnormal; Notable for the following components:   Ketones, ur 5 (*)    Bacteria, UA RARE (*)    All other components within normal limits  RESP PANEL BY RT-PCR (RSV, FLU A&B, COVID)  RVPGX2  CULTURE, BLOOD (ROUTINE X 2)  CULTURE, BLOOD (ROUTINE X 2)  LACTIC ACID, PLASMA  PROTIME-INR  MAGNESIUM   LACTIC ACID, PLASMA  I-STAT CHEM 8, ED    EKG: EKG Interpretation Date/Time:  Monday February 15 2024 14:44:16 EDT Ventricular Rate:  94 PR Interval:  162 QRS Duration:  92 QT Interval:  347 QTC  Calculation: 434 R Axis:   -33  Text Interpretation: Sinus rhythm Left axis deviation Low voltage, precordial leads RSR' in V1 or V2, probably normal variant Confirmed by Melvenia Motto [phone]) on 02/15/2024 3:08:38 PM  Radiology: CT Angio Chest PE W and/or Wo Contrast Result Date: 02/15/2024 CLINICAL DATA:  Pulmonary embolism (PE) suspected, high prob; Abdominal pain, acute, nonlocalized Sepsis Lethargy, fatigue, weakness and cough.  History of lupus. EXAM: CT ANGIOGRAPHY CHEST CT ABDOMEN AND PELVIS WITH CONTRAST TECHNIQUE: Multidetector CT imaging of the chest was performed using the standard protocol during bolus administration of intravenous contrast. Multiplanar CT image reconstructions and MIPs were obtained to evaluate the vascular anatomy. Multidetector CT imaging of the abdomen and pelvis was performed using the standard protocol during bolus administration of intravenous contrast. RADIATION DOSE REDUCTION: This exam was performed according to the departmental dose-optimization program which includes automated exposure control, adjustment of the mA and/or kV according to patient size and/or use of iterative reconstruction technique. CONTRAST:  80mL OMNIPAQUE  IOHEXOL  350 MG/ML SOLN COMPARISON:  Abdominopelvic CT 11/28/2021 and 11/13/2021. Chest radiographs 02/15/2024. No comparison chest CT. FINDINGS: CTA CHEST FINDINGS Cardiovascular: The pulmonary arteries are well opacified with contrast to the level of the segmental branches. There is no evidence of acute pulmonary embolism. There is atherosclerosis of the aorta, great vessels and coronary arteries. No acute systemic arterial abnormalities are identified. The heart size is normal. There is no pericardial effusion. Mediastinum/Nodes: Nonspecific, mildly prominent mediastinal and hilar lymph nodes, including a right hilar node measuring 1.2 cm and a subcarinal node measuring 1.3 cm on image 42/503. No axillary adenopathy. The thyroid  gland, trachea and  esophagus demonstrate no significant findings. Lungs/Pleura: Pulmonary assessment mildly limited by breathing artifact. As seen on earlier radiographs, there is right middle and lower lobe consolidation with diffuse central airway thickening and patchy peribronchovascular airspace opacities in the aerated portions of both lungs. No left lung consolidation demonstrated. There is a small right pleural effusion. No pneumothorax. Mild paraseptal emphysematous changes are present in both lung apices. Musculoskeletal/Chest wall: No chest wall mass or  suspicious osseous findings. CT ABDOMEN AND PELVIS FINDINGS Hepatobiliary: The liver is normal in density without suspicious focal abnormality. Status post cholecystectomy without evidence of significant biliary dilatation. Pancreas: Unchanged chronic low-density lesion within the uncinate process, measuring 1.9 x 1.1 cm, likely an incidental cystic indolent neoplasm. No pancreatic ductal dilatation or surrounding inflammation. Spleen: Normal in size without focal abnormality. Adrenals/Urinary Tract: Both adrenal glands appear normal. No evidence of urinary tract calculus, suspicious renal lesion or hydronephrosis. A Foley catheter is in place with a small amount of air in the bladder lumen. No focal bladder abnormalities are identified. Stomach/Bowel: No enteric contrast administered. The stomach appears unremarkable for its degree of distention. No small bowel distension, wall thickening or surrounding inflammation. The proximal colon is decompressed with possible mild wall thickening of the ascending and proximal transverse colon. No other colonic wall thickening identified. There is mildly prominent stool and scattered diverticulosis in the distal colon. Vascular/Lymphatic: There are no enlarged abdominal or pelvic lymph nodes. Aortic and branch vessel atherosclerosis without evidence of aneurysm or large vessel occlusion. Reproductive: Retroverted uterus with unchanged  2.7 cm right fundal fibroid. No adnexal mass. Other: No evidence of abdominal wall mass or hernia. No ascites or pneumoperitoneum. Musculoskeletal: No acute or significant osseous findings. Convex left lumbar scoliosis with mild multilevel spondylosis. Review of the MIP images confirms the above findings. IMPRESSION: 1. No evidence of acute pulmonary embolism or other acute vascular findings in the chest. 2. Right middle and lower lobe consolidation with diffuse central airway thickening and patchy peribronchovascular airspace opacities in the aerated portions of both lungs, most consistent with multilobar pneumonia. Radiographic follow-up recommended to document clearing. 3. Nonspecific mildly prominent mediastinal and hilar lymph nodes, likely reactive. 4. Possible mild wall thickening of the ascending and proximal transverse colon, potentially due to underdistention or mild colitis. Correlate clinically. No evidence of bowel obstruction or perforation. 5. Stable chronic low-density lesion in the uncinate process of the pancreas, likely an incidental cystic indolent neoplasm. This does not appear significantly changed from available priors dating back to 12/31/2018. Older comparison studies are unavailable. 6. Aortic Atherosclerosis (ICD10-I70.0) and Emphysema (ICD10-J43.9). Electronically Signed   By: Elsie Perone M.D.   On: 02/15/2024 17:41   CT ABDOMEN PELVIS W CONTRAST Result Date: 02/15/2024 CLINICAL DATA:  Pulmonary embolism (PE) suspected, high prob; Abdominal pain, acute, nonlocalized Sepsis Lethargy, fatigue, weakness and cough.  History of lupus. EXAM: CT ANGIOGRAPHY CHEST CT ABDOMEN AND PELVIS WITH CONTRAST TECHNIQUE: Multidetector CT imaging of the chest was performed using the standard protocol during bolus administration of intravenous contrast. Multiplanar CT image reconstructions and MIPs were obtained to evaluate the vascular anatomy. Multidetector CT imaging of the abdomen and pelvis was  performed using the standard protocol during bolus administration of intravenous contrast. RADIATION DOSE REDUCTION: This exam was performed according to the departmental dose-optimization program which includes automated exposure control, adjustment of the mA and/or kV according to patient size and/or use of iterative reconstruction technique. CONTRAST:  80mL OMNIPAQUE  IOHEXOL  350 MG/ML SOLN COMPARISON:  Abdominopelvic CT 11/28/2021 and 11/13/2021. Chest radiographs 02/15/2024. No comparison chest CT. FINDINGS: CTA CHEST FINDINGS Cardiovascular: The pulmonary arteries are well opacified with contrast to the level of the segmental branches. There is no evidence of acute pulmonary embolism. There is atherosclerosis of the aorta, great vessels and coronary arteries. No acute systemic arterial abnormalities are identified. The heart size is normal. There is no pericardial effusion. Mediastinum/Nodes: Nonspecific, mildly prominent mediastinal and hilar lymph nodes, including a  right hilar node measuring 1.2 cm and a subcarinal node measuring 1.3 cm on image 42/503. No axillary adenopathy. The thyroid  gland, trachea and esophagus demonstrate no significant findings. Lungs/Pleura: Pulmonary assessment mildly limited by breathing artifact. As seen on earlier radiographs, there is right middle and lower lobe consolidation with diffuse central airway thickening and patchy peribronchovascular airspace opacities in the aerated portions of both lungs. No left lung consolidation demonstrated. There is a small right pleural effusion. No pneumothorax. Mild paraseptal emphysematous changes are present in both lung apices. Musculoskeletal/Chest wall: No chest wall mass or suspicious osseous findings. CT ABDOMEN AND PELVIS FINDINGS Hepatobiliary: The liver is normal in density without suspicious focal abnormality. Status post cholecystectomy without evidence of significant biliary dilatation. Pancreas: Unchanged chronic low-density  lesion within the uncinate process, measuring 1.9 x 1.1 cm, likely an incidental cystic indolent neoplasm. No pancreatic ductal dilatation or surrounding inflammation. Spleen: Normal in size without focal abnormality. Adrenals/Urinary Tract: Both adrenal glands appear normal. No evidence of urinary tract calculus, suspicious renal lesion or hydronephrosis. A Foley catheter is in place with a small amount of air in the bladder lumen. No focal bladder abnormalities are identified. Stomach/Bowel: No enteric contrast administered. The stomach appears unremarkable for its degree of distention. No small bowel distension, wall thickening or surrounding inflammation. The proximal colon is decompressed with possible mild wall thickening of the ascending and proximal transverse colon. No other colonic wall thickening identified. There is mildly prominent stool and scattered diverticulosis in the distal colon. Vascular/Lymphatic: There are no enlarged abdominal or pelvic lymph nodes. Aortic and branch vessel atherosclerosis without evidence of aneurysm or large vessel occlusion. Reproductive: Retroverted uterus with unchanged 2.7 cm right fundal fibroid. No adnexal mass. Other: No evidence of abdominal wall mass or hernia. No ascites or pneumoperitoneum. Musculoskeletal: No acute or significant osseous findings. Convex left lumbar scoliosis with mild multilevel spondylosis. Review of the MIP images confirms the above findings. IMPRESSION: 1. No evidence of acute pulmonary embolism or other acute vascular findings in the chest. 2. Right middle and lower lobe consolidation with diffuse central airway thickening and patchy peribronchovascular airspace opacities in the aerated portions of both lungs, most consistent with multilobar pneumonia. Radiographic follow-up recommended to document clearing. 3. Nonspecific mildly prominent mediastinal and hilar lymph nodes, likely reactive. 4. Possible mild wall thickening of the ascending  and proximal transverse colon, potentially due to underdistention or mild colitis. Correlate clinically. No evidence of bowel obstruction or perforation. 5. Stable chronic low-density lesion in the uncinate process of the pancreas, likely an incidental cystic indolent neoplasm. This does not appear significantly changed from available priors dating back to 12/31/2018. Older comparison studies are unavailable. 6. Aortic Atherosclerosis (ICD10-I70.0) and Emphysema (ICD10-J43.9). Electronically Signed   By: Elsie Perone M.D.   On: 02/15/2024 17:41   CT Head Wo Contrast Result Date: 02/15/2024 CLINICAL DATA:  Altered mental status. EXAM: CT HEAD WITHOUT CONTRAST TECHNIQUE: Contiguous axial images were obtained from the base of the skull through the vertex without intravenous contrast. RADIATION DOSE REDUCTION: This exam was performed according to the departmental dose-optimization program which includes automated exposure control, adjustment of the mA and/or kV according to patient size and/or use of iterative reconstruction technique. COMPARISON:  None Available. FINDINGS: Brain: There is generalized cerebral atrophy with widening of the extra-axial spaces and ventricular dilatation. There are areas of decreased attenuation within the white matter tracts of the supratentorial brain, consistent with microvascular disease changes. A chronic right frontoparietal infarct is seen. Vascular:  No hyperdense vessel or unexpected calcification. Skull: Normal. Negative for fracture or focal lesion. Sinuses/Orbits: There is marked severity bilateral maxillary sinus, bilateral nasal mucosal and bilateral ethmoid sinus mucosal thickening. Other: None. IMPRESSION: 1. Generalized cerebral atrophy and microvascular disease changes of the supratentorial brain. 2. Chronic right frontoparietal infarct. 3. Marked severity bilateral maxillary sinus, bilateral nasal mucosal and bilateral ethmoid sinus disease. Electronically Signed    By: Suzen Dials M.D.   On: 02/15/2024 17:26   DG Chest Port 1 View Result Date: 02/15/2024 CLINICAL DATA:  Questionable sepsis.  Lethargy and fatigue.  Cough. EXAM: PORTABLE CHEST 1 VIEW COMPARISON:  11/28/2021. FINDINGS: Patient is rotated. Trachea is midline. Heart size within normal limits. Right middle and right lower lobe airspace opacification. Left lung is grossly clear. No left pleural fluid. There may be a small right pleural effusion. IMPRESSION: Right middle and right lower lobe pneumonia with a probable small right pleural effusion. Followup PA and lateral chest X-ray is recommended in 3-4 weeks following trial of antibiotic therapy to ensure resolution and exclude underlying malignancy. Electronically Signed   By: Newell Eke M.D.   On: 02/15/2024 15:07     Procedures   Medications Ordered in the ED  lactated ringers  infusion (0 mLs Intravenous Hold 02/15/24 1415)  vancomycin (VANCOREADY) IVPB 1500 mg/300 mL ( Intravenous Infusion Verify 02/15/24 1704)  azithromycin (ZITHROMAX) 500 mg in sodium chloride  0.9 % 250 mL IVPB (has no administration in time range)  lactated ringers  bolus 1,000 mL (1,000 mLs Intravenous New Bag/Given 02/15/24 1411)    And  lactated ringers  bolus 500 mL (0 mLs Intravenous Stopped 02/15/24 1427)    And  lactated ringers  bolus 250 mL (0 mLs Intravenous Stopped 02/15/24 1419)  ceFEPIme (MAXIPIME) 2 g in sodium chloride  0.9 % 100 mL IVPB (0 g Intravenous Stopped 02/15/24 1507)  metroNIDAZOLE (FLAGYL) IVPB 500 mg (0 mg Intravenous Stopped 02/15/24 1609)  acetaminophen  (TYLENOL ) suppository 650 mg (650 mg Rectal Given 02/15/24 1419)  ipratropium-albuterol  (DUONEB) 0.5-2.5 (3) MG/3ML nebulizer solution 3 mL (3 mLs Nebulization Given 02/15/24 1425)  iohexol  (OMNIPAQUE ) 350 MG/ML injection 80 mL (80 mLs Intravenous Contrast Given 02/15/24 1632)                                    Medical Decision Making Amount and/or Complexity of Data  Reviewed Labs: ordered. Radiology: ordered.  Risk OTC drugs. Prescription drug management.   This patient presents to the ED for concern of altered mental status, this involves an extensive number of treatment options, and is a complaint that carries with it a high risk of complications and morbidity.  The differential diagnosis includes sepsis, polypharmacy, metabolic derangements, CVA, seizure   Co morbidities / Chronic conditions that complicate the patient evaluation  anxiety, depression, migraines, GERD, fibromyalgia, CVA   Additional history obtained:  Additional history obtained from EMR External records from outside source obtained and reviewed including EMS   Lab Tests:  I Ordered, and personally interpreted labs.  The pertinent results include: Hemoglobin slightly decreased at baseline, no leukocytosis, normal kidney function, normal electrolytes.  BNP mildly elevated.   Imaging Studies ordered:  I ordered imaging studies including chest x-ray, CT head, CTA chest, CT of abdomen and pelvis I independently visualized and interpreted imaging which showed right middle and lower lobe pneumonia. I agree with the radiologist interpretation   Cardiac Monitoring: / EKG:  The patient was maintained  on a cardiac monitor.  I personally viewed and interpreted the cardiac monitored which showed an underlying rhythm of: Sinus rhythm   Problem List / ED Course / Critical interventions / Medication management  Patient presenting for cough, fatigue, lethargy, and confusion.  She meets sepsis criteria on arrival.  Septic workup and treatment were initiated.  Patient is currently confused and unable to provide any history.  She does have wheezing on lung auscultation.  She does have abdominal tenderness.  Will plan on imaging studies.  Tylenol  was ordered for antipyresis.  DuoNeb was given for wheezing.  While in the ED, patient did have SpO2 in the low 80s.  She was placed on  supplemental oxygen.  X-ray shows right lower lobe pneumonia.  Azithromycin was ordered for broadened antibiotic coverage.  CT imaging showed redemonstration of this without any other acute findings.  Patient remained hemodynamically stable.  She was admitted for further management. I ordered medication including IV fluids and broad-spectrum antibiotics for sepsis; Tylenol  for antipyresis; DuoNeb for wheezing Reevaluation of the patient after these medicines showed that the patient improved I have reviewed the patients home medicines and have made adjustments as needed  Social Determinants of Health:  Lives at home with family  CRITICAL CARE Performed by: Bernardino Fireman   Total critical care time: 32 minutes  Critical care time was exclusive of separately billable procedures and treating other patients.  Critical care was necessary to treat or prevent imminent or life-threatening deterioration.  Critical care was time spent personally by me on the following activities: development of treatment plan with patient and/or surrogate as well as nursing, discussions with consultants, evaluation of patient's response to treatment, examination of patient, obtaining history from patient or surrogate, ordering and performing treatments and interventions, ordering and review of laboratory studies, ordering and review of radiographic studies, pulse oximetry and re-evaluation of patient's condition.     Final diagnoses:  Pneumonia of right lung due to infectious organism, unspecified part of lung  Sepsis with acute hypoxic respiratory failure without septic shock, due to unspecified organism [facility])    ED Discharge Orders     None          Fireman Bernardino, MD 02/15/24 ZEB

## 2024-02-15 NOTE — ED Triage Notes (Signed)
 Pt son stated his mom is very lethargic and fatigue. Weak everywhere and has a bad cough. Pt has a hx of lupus

## 2024-02-16 DIAGNOSIS — R6520 Severe sepsis without septic shock: Secondary | ICD-10-CM | POA: Diagnosis not present

## 2024-02-16 DIAGNOSIS — A419 Sepsis, unspecified organism: Secondary | ICD-10-CM | POA: Diagnosis not present

## 2024-02-16 LAB — BASIC METABOLIC PANEL WITH GFR
Anion gap: 10 (ref 5–15)
BUN: 10 mg/dL (ref 8–23)
CO2: 22 mmol/L (ref 22–32)
Calcium: 8.3 mg/dL — ABNORMAL LOW (ref 8.9–10.3)
Chloride: 105 mmol/L (ref 98–111)
Creatinine, Ser: 0.67 mg/dL (ref 0.44–1.00)
GFR, Estimated: >60 mL/min (ref >60)
Glucose, Bld: 72 mg/dL (ref 70–99)
Potassium: 3.1 mmol/L — ABNORMAL LOW (ref 3.5–5.1)
Sodium: 138 mmol/L (ref 135–145)

## 2024-02-16 LAB — CBC
HCT: 26.5 % — ABNORMAL LOW (ref 36.0–46.0)
Hemoglobin: 9.1 g/dL — ABNORMAL LOW (ref 12.0–15.0)
MCH: 31.5 pg (ref 26.0–34.0)
MCHC: 34.3 g/dL (ref 30.0–36.0)
MCV: 91.7 fL (ref 80.0–100.0)
Platelets: 138 K/uL — ABNORMAL LOW (ref 150–400)
RBC: 2.89 MIL/uL — ABNORMAL LOW (ref 3.87–5.11)
RDW: 13.9 % (ref 11.5–15.5)
WBC: 5.4 K/uL (ref 4.0–10.5)
nRBC: 0 % (ref 0.0–0.2)

## 2024-02-16 LAB — EXPECTORATED SPUTUM ASSESSMENT W GRAM STAIN, RFLX TO RESP C

## 2024-02-16 LAB — STREP PNEUMONIAE URINARY ANTIGEN: Strep Pneumo Urinary Antigen: NEGATIVE

## 2024-02-16 MED ORDER — LIDOCAINE 5 % EX PTCH
1.0000 | MEDICATED_PATCH | CUTANEOUS | Status: DC
  Administered 2024-02-16 – 2024-02-17 (×2): 1 via TRANSDERMAL
  Filled 2024-02-16 (×2): qty 1

## 2024-02-16 MED ORDER — CHLORHEXIDINE GLUCONATE CLOTH 2 % EX PADS
6.0000 | MEDICATED_PAD | Freq: Every day | CUTANEOUS | Status: DC
  Administered 2024-02-16 – 2024-02-18 (×3): 6 via TOPICAL

## 2024-02-16 MED ORDER — VALPROIC ACID 250 MG PO CAPS
250.0000 mg | ORAL_CAPSULE | Freq: Every day | ORAL | Status: DC
  Administered 2024-02-16 – 2024-02-17 (×2): 250 mg via ORAL
  Filled 2024-02-16 (×3): qty 1

## 2024-02-16 MED ORDER — POTASSIUM CHLORIDE CRYS ER 20 MEQ PO TBCR
40.0000 meq | EXTENDED_RELEASE_TABLET | Freq: Once | ORAL | Status: AC
  Administered 2024-02-16: 40 meq via ORAL
  Filled 2024-02-16: qty 2

## 2024-02-16 MED ORDER — MIRABEGRON ER 25 MG PO TB24
50.0000 mg | ORAL_TABLET | Freq: Every day | ORAL | Status: DC
  Administered 2024-02-16 – 2024-02-18 (×3): 50 mg via ORAL
  Filled 2024-02-16 (×3): qty 2

## 2024-02-16 NOTE — Plan of Care (Signed)
  Problem: Acute Rehab PT Goals(only PT should resolve) Goal: Pt Will Go Supine/Side To Sit Outcome: Progressing Flowsheets (Taken 02/16/2024 1407) Pt will go Supine/Side to Sit:  with modified independence  with supervision Goal: Patient Will Transfer Sit To/From Stand Outcome: Progressing Flowsheets (Taken 02/16/2024 1407) Patient will transfer sit to/from stand:  with modified independence  with supervision Goal: Pt Will Transfer Bed To Chair/Chair To Bed Outcome: Progressing Flowsheets (Taken 02/16/2024 1407) Pt will Transfer Bed to Chair/Chair to Bed:  with modified independence  with supervision Goal: Pt Will Ambulate Outcome: Progressing Flowsheets (Taken 02/16/2024 1407) Pt will Ambulate:  50 feet  with modified independence  with supervision  with rolling walker  2:09 PM, 02/16/24 Lynwood Music, MPT Physical Therapist with [facility] [phone] office [phone] mobile phone

## 2024-02-16 NOTE — Evaluation (Signed)
 Occupational Therapy Evaluation Patient Details Name: Susan Hodge MRN: [id number] DOB: 17-Sep-1950 Today's Date: 02/16/2024   History of Present Illness   [name] is a 73 y.o. female with medical history significant for neurogenic bladder, fibromyalgia, migraines, left hemiplegia, anxiety and depression, chronic pain.  Patient was brought to the ED by EMS with reports of weakness, cough of 3 days duration, family also noted confusion and lethargy.  At time of my evaluation, patient is quite lethargic, appears quite weak but follows directions, answers some questions. (per MD)     Clinical Impressions Pt agreeable to OT and PT co-evaluation. Pt mostly independent at baseline with PRN use of RW. Pt cold and mildly lethargic at times today. Pt hard of hearing with family present and assisting with discussion of prior living set up. Family reports pt can have 24/7 support at home. Pt saturated above 90% for the duration of the session without supplemental O2. Pt required min A for bed mobility and min to mod A for EOB to chair transfer with RW. Pt able to complete ADL's with CCA for safety due to anterior lean while seated at the EOB. Pt left in the chair with family present and call bell within reach. Pt will benefit from continued OT in the hospital to increase strength, balance, and endurance for safe ADL's.        If plan is discharge home, recommend the following:   A little help with walking and/or transfers;A little help with bathing/dressing/bathroom;Assistance with cooking/housework;Help with stairs or ramp for entrance;Assist for transportation     Functional Status Assessment   Patient has had a recent decline in their functional status and demonstrates the ability to make significant improvements in function in a reasonable and predictable amount of time.     Equipment Recommendations   None recommended by OT             Precautions/Restrictions    Precautions Precautions: Fall Recall of Precautions/Restrictions: Intact Restrictions Weight Bearing Restrictions Per Provider Order: No     Mobility Bed Mobility Overal bed mobility: Needs Assistance Bed Mobility: Supine to Sit     Supine to sit: Contact guard, Min assist     General bed mobility comments: Assit to scoot to EOB with hand held assist.    Transfers Overall transfer level: Needs assistance Equipment used: Rolling walker (2 wheels) Transfers: Sit to/from Stand, Bed to chair/wheelchair/BSC Sit to Stand: Min assist, Mod assist     Step pivot transfers: Min assist, Mod assist     General transfer comment: EOB to chair with labored movement.      Balance Overall balance assessment: Needs assistance Sitting-balance support: No upper extremity supported, Feet supported Sitting balance-Leahy Scale: Fair Sitting balance - Comments: fair to good seated at EOB; very kyphotic sitting posture.   Standing balance support: Bilateral upper extremity supported, During functional activity, Reliant on assistive device for balance Standing balance-Leahy Scale: Fair Standing balance comment: using RW                           ADL either performed or assessed with clinical judgement   ADL Overall ADL's : Needs assistance/impaired     Grooming: Set up;Sitting   Upper Body Bathing: Set up;Sitting;Contact guard assist   Lower Body Bathing: Contact guard assist;Set up;Sitting/lateral leans   Upper Body Dressing : Set up;Contact guard assist;Sitting   Lower Body Dressing: Set up;Contact guard assist;Sitting/lateral leans Lower Body  Dressing Details (indicate cue type and reason): Able to don socks with CGA at EOB. Toilet Transfer: Minimal assistance;Moderate assistance;Stand-pivot;Rolling walker (2 wheels) Toilet Transfer Details (indicate cue type and reason): EOB to chair with RW; labored movement; Toileting- Clothing Manipulation and Hygiene: Contact  guard assist;Sitting/lateral lean       Functional mobility during ADLs: Minimal assistance;Rolling walker (2 wheels);Moderate assistance General ADL Comments: Pt very cold and shaking most of session; labored movement throughout.     Vision Baseline Vision/History: 1 Wears glasses Ability to See in Adequate Light: 1 Impaired Patient Visual Report: No change from baseline Vision Assessment?: No apparent visual deficits     Perception Perception: Not tested       Praxis Praxis: Not tested       Pertinent Vitals/Pain Pain Assessment Pain Assessment: No/denies pain     Extremity/Trunk Assessment Upper Extremity Assessment Upper Extremity Assessment: Generalized weakness   Lower Extremity Assessment Lower Extremity Assessment: Defer to PT evaluation   Cervical / Trunk Assessment Cervical / Trunk Assessment: Kyphotic   Communication Communication Communication: Impaired Factors Affecting Communication: Hearing impaired   Cognition Arousal: Alert Behavior During Therapy: WFL for tasks assessed/performed Cognition: No apparent impairments             OT - Cognition Comments: Pt a little lethargic with family assisting to answer home set up questions at times.                 Following commands: Intact       Cueing  General Comments   Cueing Techniques: Verbal cues;Tactile cues;Visual cues  Pt removed from supplemental O2 and saturated >90% the entire session. Left off of supplemental O2.              Home Living Family/patient expects to be discharged to:: Private residence Living Arrangements: Alone Available Help at Discharge: Family;Available 24 hours/day Type of Home: Mobile home Home Access: Ramped entrance     Home Layout: One level     Bathroom Shower/Tub: Tub/shower unit;Sponge bathes at baseline   Bathroom Toilet: Handicapped height     Home Equipment: Agricultural consultant (2 wheels);Shower seat;BSC/3in1;Hospital bed;Wheelchair -  manual;Grab bars - toilet   Additional Comments: Son can help patient 24/7. Granddaughters present in the room today.      Prior Functioning/Environment Prior Level of Function : Needs assist       Physical Assist : ADLs (physical)   ADLs (physical): IADLs Mobility Comments: RW use PRN; using scooter at the grocery store some. ADLs Comments: Independent ADL's; cooks and cleans; taken to grocery store.    OT Problem List: Decreased strength;Decreased activity tolerance;Impaired balance (sitting and/or standing)   OT Treatment/Interventions: Self-care/ADL training;Therapeutic exercise;Therapeutic activities;Patient/family education;Balance training      OT Goals(Current goals can be found in the care plan section)   Acute Rehab OT Goals Patient Stated Goal: return home OT Goal Formulation: With patient Time For Goal Achievement: 03/01/24 Potential to Achieve Goals: Good   OT Frequency:  Min 2X/week    Co-evaluation PT/OT/SLP Co-Evaluation/Treatment: Yes Reason for Co-Treatment: To address functional/ADL transfers   OT goals addressed during session: ADL's and self-care                       End of Session Equipment Utilized During Treatment: Rolling walker (2 wheels)  Activity Tolerance: Patient tolerated treatment well Patient left: in chair;with call bell/phone within reach;with family/visitor present  OT Visit Diagnosis: Unsteadiness on feet (R26.81);Other abnormalities of  gait and mobility (R26.89);Muscle weakness (generalized) (M62.81)                Time: 9049-8983 OT Time Calculation (min): 26 min Charges:  OT General Charges $OT Visit: 1 Visit OT Evaluation $OT Eval Low Complexity: 1 Low  Aunya Lemler OT, MOT  Jayson Person 02/16/2024, 12:01 PM

## 2024-02-16 NOTE — Evaluation (Signed)
 Physical Therapy Evaluation Patient Details Name: Susan Hodge MRN: [id number] DOB: 12-14-1950 Today's Date: 02/16/2024  History of Present Illness  Susan Hodge is a 73 y.o. female with medical history significant for neurogenic bladder, fibromyalgia, migraines, left hemiplegia, anxiety and depression, chronic pain.  Patient was brought to the ED by EMS with reports of weakness, cough of 3 days duration, family also noted confusion and lethargy.  At time of my evaluation, patient is quite lethargic, appears quite weak but follows directions, answers some questions.   Clinical Impression  Patient demonstrates slow labored movement for sitting up at bedside, apprehensive for walking and limited to a few side steps at bedside before requesting to sit due to fatigue. Patient tolerated sitting up in chair after therapy with family members present. Patient will benefit from continued skilled physical therapy in hospital and recommended venue below to increase strength, balance, endurance for safe ADLs and gait.           If plan is discharge home, recommend the following: A little help with walking and/or transfers;A little help with bathing/dressing/bathroom;Help with stairs or ramp for entrance;Assistance with cooking/housework;Assist for transportation   Can travel by private vehicle        Equipment Recommendations None recommended by PT  Recommendations for Other Services       Functional Status Assessment Patient has had a recent decline in their functional status and demonstrates the ability to make significant improvements in function in a reasonable and predictable amount of time.     Precautions / Restrictions Precautions Precautions: Fall Recall of Precautions/Restrictions: Intact Restrictions Weight Bearing Restrictions Per Provider Order: No      Mobility  Bed Mobility Overal bed mobility: Needs Assistance Bed Mobility: Supine to Sit     Supine to sit:  Contact guard, Min assist     General bed mobility comments: increased time, labored movement    Transfers Overall transfer level: Needs assistance Equipment used: Rolling walker (2 wheels) Transfers: Sit to/from Stand, Bed to chair/wheelchair/BSC Sit to Stand: Min assist, Mod assist   Step pivot transfers: Min assist, Mod assist       General transfer comment: EOB to chair with labored movement.    Ambulation/Gait Ambulation/Gait assistance: Min assist, Mod assist Gait Distance (Feet): 4 Feet Assistive device: Rolling walker (2 wheels) Gait Pattern/deviations: Decreased step length - right, Decreased step length - left, Decreased stride length, Trunk flexed Gait velocity: slow     General Gait Details: slow labored movement limited mostly due to patient c/o fatigue and generalized weakness  Stairs            Wheelchair Mobility     Tilt Bed    Modified Rankin (Stroke Patients Only)       Balance Overall balance assessment: Needs assistance Sitting-balance support: No upper extremity supported, Feet supported Sitting balance-Leahy Scale: Fair Sitting balance - Comments: fair to good seated at EOB; very kyphotic sitting posture.   Standing balance support: Bilateral upper extremity supported, During functional activity, Reliant on assistive device for balance Standing balance-Leahy Scale: Fair Standing balance comment: using RW                             Pertinent Vitals/Pain Pain Assessment Pain Assessment: No/denies pain    Home Living Family/patient expects to be discharged to:: Private residence Living Arrangements: Alone Available Help at Discharge: Family;Available 24 hours/day Type of Home: Mobile home Home Access: Ramped  entrance       Home Layout: One level Home Equipment: Agricultural consultant (2 wheels);Shower seat;BSC/3in1;Hospital bed;Wheelchair - manual;Grab bars - toilet Additional Comments: Son can help patient 24/7.  Granddaughters present in the room today.    Prior Function Prior Level of Function : Needs assist       Physical Assist : ADLs (physical);Mobility (physical) Mobility (physical): Bed mobility;Transfers;Gait;Stairs ADLs (physical): IADLs Mobility Comments: household and short distanced community ambulation using RW PRN, scooters in grocery stores ADLs Comments: Independent ADL's; cooks and cleans; taken to grocery store.     Extremity/Trunk Assessment   Upper Extremity Assessment Upper Extremity Assessment: Defer to OT evaluation    Lower Extremity Assessment Lower Extremity Assessment: Generalized weakness    Cervical / Trunk Assessment Cervical / Trunk Assessment: Kyphotic  Communication   Communication Communication: Impaired Factors Affecting Communication: Hearing impaired    Cognition Arousal: Alert Behavior During Therapy: WFL for tasks assessed/performed, Anxious                             Following commands: Intact       Cueing Cueing Techniques: Verbal cues, Tactile cues, Visual cues     General Comments General comments (skin integrity, edema, etc.): Pt removed from supplemental O2 and saturated >90% the entire session. Left off of supplemental O2.    Exercises     Assessment/Plan    PT Assessment Patient needs continued PT services  PT Problem List Decreased strength;Decreased activity tolerance;Decreased balance;Decreased mobility       PT Treatment Interventions DME instruction;Gait training;Stair training;Functional mobility training;Therapeutic activities;Therapeutic exercise;Balance training;Patient/family education    PT Goals (Current goals can be found in the Care Plan section)  Acute Rehab PT Goals Patient Stated Goal: return home with family assist PT Goal Formulation: With patient Time For Goal Achievement: 02/19/24 Potential to Achieve Goals: Good    Frequency Min 3X/week     Co-evaluation PT/OT/SLP  Co-Evaluation/Treatment: Yes Reason for Co-Treatment: To address functional/ADL transfers PT goals addressed during session: Mobility/safety with mobility;Balance;Proper use of DME OT goals addressed during session: ADL's and self-care       AM-PAC PT 6 Clicks Mobility  Outcome Measure Help needed turning from your back to your side while in a flat bed without using bedrails?: A Little Help needed moving from lying on your back to sitting on the side of a flat bed without using bedrails?: A Little Help needed moving to and from a bed to a chair (including a wheelchair)?: A Lot Help needed standing up from a chair using your arms (e.g., wheelchair or bedside chair)?: A Little Help needed to walk in hospital room?: A Lot Help needed climbing 3-5 steps with a railing? : A Lot 6 Click Score: 15    End of Session Equipment Utilized During Treatment: Gait belt Activity Tolerance: Patient tolerated treatment well;Patient limited by fatigue Patient left: in chair;with call bell/phone within reach;with family/visitor present Nurse Communication: Mobility status PT Visit Diagnosis: Unsteadiness on feet (R26.81);Other abnormalities of gait and mobility (R26.89);Muscle weakness (generalized) (M62.81)    Time: 9050-8987 PT Time Calculation (min) (ACUTE ONLY): 23 min   Charges:   PT Evaluation $PT Eval Moderate Complexity: 1 Mod PT Treatments $Therapeutic Activity: 23-37 mins PT General Charges $$ ACUTE PT VISIT: 1 Visit         2:06 PM, 02/16/24 Lynwood Music, MPT Physical Therapist with [facility] 336 [phone] office [phone] mobile phone

## 2024-02-16 NOTE — Plan of Care (Signed)
  Problem: Acute Rehab OT Goals (only OT should resolve) Goal: Pt. Will Perform Grooming Flowsheets (Taken 02/16/2024 1204) Pt Will Perform Grooming:  with modified independence  standing Goal: Pt. Will Perform Lower Body Dressing Flowsheets (Taken 02/16/2024 1204) Pt Will Perform Lower Body Dressing:  with modified independence  sitting/lateral leans Goal: Pt. Will Transfer To Toilet Flowsheets (Taken 02/16/2024 1204) Pt Will Transfer to Toilet:  with modified independence  ambulating Goal: Pt. Will Perform Toileting-Clothing Manipulation Flowsheets (Taken 02/16/2024 1204) Pt Will Perform Toileting - Clothing Manipulation and hygiene: with modified independence Goal: Pt/Caregiver Will Perform Home Exercise Program Flowsheets (Taken 02/16/2024 1204) Pt/caregiver will Perform Home Exercise Program:  Increased strength  Independently  Both right and left upper extremity  Ayesha Markwell OT, MOT

## 2024-02-16 NOTE — TOC Initial Note (Signed)
 Transition of Care [facility]) - Initial/Assessment Note    Patient Details  Name: Susan Hodge MRN: [id number] Date of Birth: 1951-02-10  Transition of Care [facility]) CM/SW Contact:    Lucie Lunger, LCSWA Phone Number: 02/16/2024, 3:28 PM  Clinical Narrative:                 CSW updated that PT is recommending HH PT for pt at D/C. CSW spoke with pt at bedside to review. Pt states that she is agreeable to [facility] being arranged and does not have an agency preference. CSW to make referral. TOC to follow.   Expected Discharge Plan: Home w Home Health Services Barriers to Discharge: Continued Medical Work up   Patient Goals and CMS Choice Patient states their goals for this hospitalization and ongoing recovery are:: get better CMS Medicare.gov Compare Post Acute Care list provided to:: Patient Choice offered to / list presented to : Patient      Expected Discharge Plan and Services In-house Referral: Clinical Social Work Discharge Planning Services: CM Consult Post Acute Care Choice: Home Health Living arrangements for the past 2 months: Single Family Home                                      Prior Living Arrangements/Services Living arrangements for the past 2 months: Single Family Home Lives with:: Self, Adult Children Patient language and need for interpreter reviewed:: Yes Do you feel safe going back to the place where you live?: Yes      Need for Family Participation in Patient Care: Yes (Comment) Care giver support system in place?: Yes (comment)   Criminal Activity/Legal Involvement Pertinent to Current Situation/Hospitalization: No - Comment as needed  Activities of Daily Living   ADL Screening (condition at time of admission) Independently performs ADLs?: No Does the patient have a NEW difficulty with bathing/dressing/toileting/self-feeding that is expected to last >3 days?: Yes (Initiates electronic notice to provider for possible OT consult) Does the patient  have a NEW difficulty with getting in/out of bed, walking, or climbing stairs that is expected to last >3 days?: Yes (Initiates electronic notice to provider for possible PT consult) Does the patient have a NEW difficulty with communication that is expected to last >3 days?: No Is the patient deaf or have difficulty hearing?: No Does the patient have difficulty seeing, even when wearing glasses/contacts?: No Does the patient have difficulty concentrating, remembering, or making decisions?: No  Permission Sought/Granted                  Emotional Assessment Appearance:: Appears stated age Attitude/Demeanor/Rapport: Engaged Affect (typically observed): Accepting Orientation: : Oriented to Self, Oriented to Place, Oriented to  Time, Oriented to Situation Alcohol  / Substance Use: Not Applicable Psych Involvement: No (comment)  Admission diagnosis:  Severe sepsis (HCC) [A41.9, R65.20] Pneumonia of right lung due to infectious organism, unspecified part of lung [J18.9] Sepsis with acute hypoxic respiratory failure without septic shock, due to unspecified organism (HCC) [A41.9, R65.20, J96.01] Patient Active Problem List   Diagnosis Date Noted   Severe sepsis (HCC) 02/15/2024   Acute metabolic encephalopathy 02/15/2024   Acute hypoxic respiratory failure (HCC) 02/15/2024   Myalgia 11/12/2023   Rash and other nonspecific skin eruption 11/12/2023   Postherpetic neuralgia 12/02/2022   High risk medication use 12/20/2019   Muscle spasm 12/20/2019   Spinal stenosis of lumbar region 06/22/2019  Neuromyelitis (HCC) 12/31/2018   Lt Hemiparesis (HCC) 12/31/2018   Pancreatic mass 12/31/2018   Anemia due to blood loss, acute 12/31/2018   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   Left hemiplegia (HCC) 09/09/2018   Dysesthesia 09/09/2018   AKI (acute kidney injury)    Neurogenic bowel    Neurogenic bladder    Anxiety state    Acute blood loss anemia    Hypoalbuminemia due to  protein-calorie malnutrition    Elevated BUN    Postoperative pain    Chronic pain syndrome    Myelitis (HCC) 07/31/2018   Leukocytosis 07/31/2018   Anxiety 07/31/2018   GERD (gastroesophageal reflux disease) 07/31/2018   ANXIETY DISORDER, GENERALIZED 07/23/2009   Hypothyroidism 06/21/2009   NONDEPENDENT TOBACCO USE DISORDER 06/21/2009   DEPRESSIVE DISORDER NOT ELSEWHERE CLASSIFIED 06/21/2009   INTERMEDIATE CORONARY SYNDROME 06/21/2009   LUPUS NEPHRITIS 06/21/2009   UNSPECIFIED MYALGIA AND MYOSITIS 06/21/2009   PRECORDIAL PAIN 06/21/2009   PCP:  Maree Isles, MD Pharmacy:   [facility] - [place], [place] - [address] [place] [address] Phone: [phone] Fax: [phone]  [id number] - [place], [place] - [address] AT [facility] [address] [place] [address] Phone: [phone] Fax: [phone]     Social Drivers of Health (SDOH) Social History: SDOH Screenings   Food Insecurity: No Food Insecurity (02/15/2024)  Housing: Low Risk  (02/15/2024)  Transportation Needs: No Transportation Needs (02/15/2024)  Utilities: Not At Risk (02/15/2024)  Depression (PHQ2-9): Low Risk  (09/09/2018)  Social Connections: Socially Isolated (02/15/2024)  Tobacco Use: High Risk (02/15/2024)   SDOH Interventions:     Readmission Risk Interventions     No data to display

## 2024-02-16 NOTE — Progress Notes (Signed)
 [facility] PROGRESS NOTE  MCKELL RIECKE (DOB: 02-Nov-1950) FMW:[id number] PCP: Maree Isles, MD Outpatient Specialists: Neurology, Dr. Vear  Brief Narrative: Susan Hodge is a 73 y.o. female with a history of chronic pain, migraines, anxiety, neuromyelitis optica in remission who presented to the ED on 02/15/2024 with confusion and shortness of breath, found to have multifocal pneumonia causing severe sepsis with acute hypoxic respiratory failure and acute metabolic encephalopathy.  Subjective: Feels terrible, has a lot of difficulty explaining specific symptoms to which she is referring. She's got a cough, I asked if she wanted medication for that, she says robitussin liked to kill her. I asked what she meant, she said it literally almost killed her, I said how, she said it was bad. I asked if it was throat closing, trouble breathing, hives, swelling, did she go to the hospital, she said it was just no good, she doesn't want robitussin. Needs something for her nerves.  Objective: BP (!) 105/51 (BP Location: Right Arm)   Pulse 94   Temp 97.7 F (36.5 C) (Oral)   Resp 18   Ht 5' 3 (1.6 m)   Wt 64.9 kg   SpO2 96%   BMI 25.35 kg/m   Gen: No distress but older and chronically ill-appearing  Pulm: Coarse R > L (limited by pt immobility), tachypneic but nonlabored  CV: RRR, no MRG, trace-mild edema bilaterally GI: Soft, NT, ND, +BS Neuro: Alert, difficult historian but oriented. No new focal deficits. Ext: Warm, no deformities Skin: No rashes, lesions or ulcers on visualized skin   Assessment & Plan: Severe sepsis due to RML/RLL CAP: Tachypnea, tachycardia, AMS, respiratory failure. LA reassuring. - Continue ceftriaxone , azithromycin pending blood cultures (NG < 24 hours). If pt able to produce, would send sputum Cx.   - F/u legionella Ag. Pneumococcal Ag neg.     Acute hypoxic respiratory failure: Requiring 2L O2. CTA chest negative for PE, shows diffuse and patchy  airspace opacities, consistent with multilobar pneumonia and right middle and lower lobe consolidation. - Continue supplemental oxygen to maintain SpO2 >89% and normal WOB.    Acute metabolic encephalopathy: presented with confusion, lethargy.  Head CT negative for acute abnormality.  Etiology likely pneumonia and severe sepsis, possibly also polypharmacy patient is on multiple psychoactive medications-Xanax , baclofen , hydroxyzine , oxycodone , Lyrica , valproic  acid. Initially quite lethargic, now anxious, in discomfort. - Resumed home xanax  0.5mg  po TID prn and percocet 10-325mg  q8h prn.  - Restart valproic  acid qHS to help suppress migraines.  Chronic pain: - Restart medications as able as discussed above.  - Try lidoderm  for multimodal control. - If remains alert, would next restart lyrica  50mg  TID. Still holding baclofen   HTN:  - Hold HCTZ with severe sepsis, restart once hemodynamically stabilized.    Neuromyelitis optica:  - Continue outpatient ultomiris injections per Dr. Vear   Hypokalemia:  - Supplement today and monitor  Isolated alkaline phosphatase elevation:  - Trend in AM.   Bernardino KATHEE Come, MD [facility] [email or web address] 02/16/2024, 11:44 AM

## 2024-02-17 ENCOUNTER — Encounter (INDEPENDENT_AMBULATORY_CARE_PROVIDER_SITE_OTHER): Payer: Self-pay | Admitting: Gastroenterology

## 2024-02-17 DIAGNOSIS — R6520 Severe sepsis without septic shock: Secondary | ICD-10-CM | POA: Diagnosis not present

## 2024-02-17 DIAGNOSIS — A419 Sepsis, unspecified organism: Secondary | ICD-10-CM | POA: Diagnosis not present

## 2024-02-17 LAB — LEGIONELLA PNEUMOPHILA SEROGP 1 UR AG
L. pneumophila Serogp 1 Ur Ag: NEGATIVE
Source of Sample: 0

## 2024-02-17 LAB — COMPREHENSIVE METABOLIC PANEL WITH GFR
ALT: 8 U/L (ref 0–44)
AST: 15 U/L (ref 15–41)
Albumin: 2.6 g/dL — ABNORMAL LOW (ref 3.5–5.0)
Alkaline Phosphatase: 156 U/L — ABNORMAL HIGH (ref 38–126)
Anion gap: 14 (ref 5–15)
BUN: 9 mg/dL (ref 8–23)
CO2: 19 mmol/L — ABNORMAL LOW (ref 22–32)
Calcium: 8.4 mg/dL — ABNORMAL LOW (ref 8.9–10.3)
Chloride: 106 mmol/L (ref 98–111)
Creatinine, Ser: 0.7 mg/dL (ref 0.44–1.00)
GFR, Estimated: >60 mL/min (ref >60)
Glucose, Bld: 66 mg/dL — ABNORMAL LOW (ref 70–99)
Potassium: 3.6 mmol/L (ref 3.5–5.1)
Sodium: 139 mmol/L (ref 135–145)
Total Bilirubin: <0.2 mg/dL (ref 0.0–1.2)
Total Protein: 6 g/dL — ABNORMAL LOW (ref 6.5–8.1)

## 2024-02-17 LAB — CBC
HCT: 26.9 % — ABNORMAL LOW (ref 36.0–46.0)
Hemoglobin: 9 g/dL — ABNORMAL LOW (ref 12.0–15.0)
MCH: 30.8 pg (ref 26.0–34.0)
MCHC: 33.5 g/dL (ref 30.0–36.0)
MCV: 92.1 fL (ref 80.0–100.0)
Platelets: 153 K/uL (ref 150–400)
RBC: 2.92 MIL/uL — ABNORMAL LOW (ref 3.87–5.11)
RDW: 14.1 % (ref 11.5–15.5)
WBC: 5.3 K/uL (ref 4.0–10.5)
nRBC: 0 % (ref 0.0–0.2)

## 2024-02-17 MED ORDER — DM-GUAIFENESIN ER 30-600 MG PO TB12
1.0000 | ORAL_TABLET | Freq: Two times a day (BID) | ORAL | Status: DC
  Administered 2024-02-17 – 2024-02-18 (×2): 1 via ORAL
  Filled 2024-02-17 (×3): qty 1

## 2024-02-17 NOTE — Care Management Important Message (Signed)
 Important Message  Patient Details  Name: Susan Hodge MRN: [id number] Date of Birth: April 28, 1951   Important Message Given:  N/A - LOS <3 / Initial given by admissions     Duwaine LITTIE Ada 02/17/2024, 10:07 AM

## 2024-02-17 NOTE — Plan of Care (Signed)

## 2024-02-17 NOTE — Progress Notes (Signed)
SATURATION QUALIFICATIONS: (This note is used to comply with regulatory documentation for home oxygen)  Patient Saturations on Room Air at Rest = 94%  Patient Saturations on Room Air while Ambulating = 93%   

## 2024-02-17 NOTE — Progress Notes (Signed)
 [facility] PROGRESS NOTE  Susan Hodge (DOB: 01/04/1951) FMW:[id number] PCP: Maree Isles, MD Outpatient Specialists: Neurology, Dr. Vear  Brief Narrative: Susan Hodge is a 73 y.o. female with a history of chronic pain, migraines, anxiety, neuromyelitis optica in remission who presented to the ED on 02/15/2024 with confusion and shortness of breath, found to have multifocal pneumonia causing severe sepsis with acute hypoxic respiratory failure and acute metabolic encephalopathy.  Subjective: Feels terrible, has a lot of difficulty explaining specific symptoms to which she is referring. She's got a cough, I asked if she wanted medication for that, she says robitussin liked to kill her. I asked what she meant, she said it literally almost killed her, I said how, she said it was bad. I asked if it was throat closing, trouble breathing, hives, swelling, did she go to the hospital, she said it was just no good, she doesn't want robitussin. Needs something for her nerves.  Objective: BP (!) 141/68 (BP Location: Left Arm)   Pulse 71   Temp 98.3 F (36.8 C) (Oral)   Resp 19   Ht 5' 3 (1.6 m)   Wt 64.9 kg   SpO2 96%   BMI 25.35 kg/m   Gen: No distress but older and chronically ill-appearing  Lungs-fair air movement, scattered rhonchi  CV: RRR, no MRG, trace-mild edema bilaterally GI: Soft, NT, ND, +BS Neuro: Alert, oriented x 3,. No new focal deficits. Ext: Warm, no deformities Skin: No rashes, lesions or ulcers on visualized skin   Assessment & Plan: Severe sepsis due to RML/RLL CAP: POA  -Continue Rocephin , azithromycin bronchodilators and mucolytics.   - F/u legionella Ag. Pneumococcal Ag neg.     Acute hypoxic respiratory failure:  CTA chest negative for PE, shows diffuse and patchy airspace opacities, consistent with multilobar pneumonia and right middle and lower lobe consolidation. - Required 2 to 3 L of oxygen -Weaned off oxygen on 02/17/2024---post ambulation O2  sats on room air 93% - Acute metabolic encephalopathy:  -  Head CT negative for acute abnormality.  Etiology likely pneumonia and severe sepsis, possibly also polypharmacy patient is on multiple psychoactive medications-Xanax , baclofen , hydroxyzine , oxycodone , Lyrica , valproic  acid. Initially quite lethargic, now anxious, in discomfort. - Resumed home xanax  0.5mg  po TID prn and percocet 10-325mg  q8h prn.  - Restart valproic  acid qHS to help suppress migraines. - As of 02/17/2024 mentation has improved significantly  Chronic pain: - Restart medications as able as discussed above.  - Try lidoderm  for multimodal control. - If remains alert, would next restart lyrica  50mg  TID. Still holding baclofen   HTN:  - Hold HCTZ with severe sepsis, restart once hemodynamically stabilized.    Neuromyelitis optica:  - Continue outpatient ultomiris injections per Dr. Vear   Hypokalemia:  - Supplement today and monitor  Isolated alkaline phosphatase elevation:  - Trend   Rendall Carwin, MD [facility] [email or web address] 02/17/2024, 8:08 PM

## 2024-02-17 NOTE — Progress Notes (Signed)
 Mobility Specialist Progress Note:    02/17/24 1045  Mobility  Activity Ambulated with assistance  Level of Assistance Minimal assist, patient does 75% or more  Assistive Device Front wheel walker  Distance Ambulated (ft) 15 ft  Range of Motion/Exercises Active;All extremities  Activity Response Tolerated well  Mobility Referral Yes  Mobility visit 1 Mobility  Mobility Specialist Start Time (ACUTE ONLY) 1045  Mobility Specialist Stop Time (ACUTE ONLY) 1110  Mobility Specialist Time Calculation (min) (ACUTE ONLY) 25 min   Pt received in bed, MD requesting O2 test. Required MinA to stand and CGA to ambulate with RW. Tolerated well, SpO2 94% on RA at rest. SpO2 93% on RA during ambulation. Became very jerky near EOS and c/o back pain. Returned supine, all needs met.  Neiman Roots Mobility Specialist Please contact via Special educational needs teacher or  Rehab office at [phone]

## 2024-02-18 DIAGNOSIS — A419 Sepsis, unspecified organism: Secondary | ICD-10-CM | POA: Diagnosis not present

## 2024-02-18 DIAGNOSIS — R6520 Severe sepsis without septic shock: Secondary | ICD-10-CM | POA: Diagnosis not present

## 2024-02-18 MED ORDER — GUAIFENESIN ER 600 MG PO TB12: 600.0000 mg | ORAL_TABLET | Freq: Two times a day (BID) | ORAL | 0 refills | Status: AC

## 2024-02-18 MED ORDER — AZITHROMYCIN 500 MG PO TABS: 500.0000 mg | ORAL_TABLET | Freq: Every day | ORAL | 0 refills | Status: AC

## 2024-02-18 MED ORDER — CEFDINIR 300 MG PO CAPS: 300.0000 mg | ORAL_CAPSULE | Freq: Two times a day (BID) | ORAL | 0 refills | Status: AC

## 2024-02-18 MED ORDER — ALBUTEROL SULFATE (2.5 MG/3ML) 0.083% IN NEBU: 2.5000 mg | INHALATION_SOLUTION | RESPIRATORY_TRACT | 2 refills | Status: AC | PRN

## 2024-02-18 MED ORDER — COMPRESSOR/NEBULIZER MISC: 1.0000 [IU] | Freq: Every day | 0 refills | Status: AC | PRN

## 2024-02-18 MED ORDER — METHYLPREDNISOLONE SODIUM SUCC 125 MG IJ SOLR
125.0000 mg | Freq: Once | INTRAMUSCULAR | Status: AC
  Administered 2024-02-18: 125 mg via INTRAVENOUS
  Filled 2024-02-18: qty 2

## 2024-02-18 NOTE — TOC Transition Note (Signed)
 Transition of Care [facility]) - Discharge Note   Patient Details  Name: Susan Hodge MRN: [id number] Date of Birth: [birth year]  [facility] CM/SW Contact:  Lucie Lunger, LCSWA Phone Number: 02/18/2024, 2:41 PM   Clinical Narrative:    CSW updated that pt is medically stable for D/C home today. HH PT/OT arranged with [facility], MD placed HH orders. CSW updated Lauraine with [facility] of plan for D/C home today. Neb machine ordered through [facility] with [employer], will deliver to the home. TOC signing off.   Final next level of care: Home w Home Health Services Barriers to Discharge: Barriers Resolved   Patient Goals and CMS Choice Patient states their goals for this hospitalization and ongoing recovery are:: return home CMS Medicare.gov Compare Post Acute Care list provided to:: Patient Choice offered to / list presented to : Patient      Discharge Placement                       Discharge Plan and Services Additional resources added to the After Visit Summary for   In-house Referral: Clinical Social Work Discharge Planning Services: CM Consult Post Acute Care Choice: Home Health          DME Arranged: Nebulizer machine DME Agency: [employer] Date DME Agency Contacted: 02/18/24   Representative spoke with at DME Agency: [facility] HH Arranged: PT, OT HH Agency: [facility] Date [facility] Agency Contacted: 02/18/24   Representative spoke with at [facility] Agency: Lauraine  Social Drivers of Health (SDOH) Interventions SDOH Screenings   Food Insecurity: No Food Insecurity (02/15/2024)  Housing: Low Risk  (02/15/2024)  Transportation Needs: No Transportation Needs (02/15/2024)  Utilities: Not At Risk (02/15/2024)  Depression (PHQ2-9): Low Risk  (09/09/2018)  Social Connections: Socially Isolated (02/15/2024)  Tobacco Use: High Risk (02/15/2024)     Readmission Risk Interventions    02/18/2024    2:39 PM  Readmission Risk Prevention Plan  Medication Screening  Complete  Transportation Screening Complete

## 2024-02-18 NOTE — Discharge Instructions (Addendum)
 1) please note that there has been a few changes to your medications 2) please use nebulizer machine for nebulizer treatments to help your breathing as advised 3) follow-up with primary care physician in  3 weeks for repeat chest x-ray to exclude underlying malignancy and also for recheck and reevaluation

## 2024-02-18 NOTE — Plan of Care (Signed)

## 2024-02-18 NOTE — Discharge Summary (Signed)
 Susan Hodge, is a 73 y.o. female  DOB [birth year]  MRN [id number].  Admission date:  02/15/2024  Admitting Physician  Tully FORBES Carwin, MD  Discharge Date:  02/18/2024   Primary MD  Maree Isles, MD  Recommendations for primary care physician for things to follow:  1) please note that there has been a few changes to your medications 2) please use nebulizer machine for nebulizer treatments to help your breathing as advised 3) follow-up with primary care physician in  3 weeks for repeat chest x-ray to exclude underlying malignancy and also for recheck and reevaluation   Admission Diagnosis  Severe sepsis (HCC) [A41.9, R65.20] Pneumonia of right lung due to infectious organism, unspecified part of lung [J18.9] Sepsis with acute hypoxic respiratory failure without septic shock, due to unspecified organism (HCC) [A41.9, R65.20, J96.01]   Discharge Diagnosis  Severe sepsis (HCC) [A41.9, R65.20] Pneumonia of right lung due to infectious organism, unspecified part of lung [J18.9] Sepsis with acute hypoxic respiratory failure without septic shock, due to unspecified organism (HCC) [A41.9, R65.20, J96.01]    Principal Problem:   Severe sepsis (HCC) Active Problems:   Acute metabolic encephalopathy   Acute hypoxic respiratory failure (HCC)   Hypothyroidism   LUPUS NEPHRITIS   Chronic pain syndrome   Neurogenic bowel   Neurogenic bladder   Left hemiplegia (HCC)      Past Medical History:  Diagnosis Date   Chest pain    Collagen vascular disease    Complication of anesthesia    Blisters to mouth.   Depression    DJD (degenerative joint disease)    FH: CVA (cerebrovascular accident)    father   Fibromyalgia    GERD (gastroesophageal reflux disease)    Hypothyroidism    Lupus    Migraines    Palpitations    Status post cholecystectomy    appendectomy and bilaateral oophorectomy   Tobacco  use     Past Surgical History:  Procedure Laterality Date   BILATERAL OOPHORECTOMY     CARDIAC CATHETERIZATION     CHOLECYSTECTOMY     COLONOSCOPY WITH PROPOFOL  N/A 01/03/2019   Procedure: COLONOSCOPY WITH PROPOFOL ;  Surgeon: Golda Claudis PENNER, MD;  Location: [facility];  Service: Endoscopy;  Laterality: N/A;   ESOPHAGOGASTRODUODENOSCOPY (EGD) WITH PROPOFOL  N/A 01/02/2019   Procedure: ESOPHAGOGASTRODUODENOSCOPY (EGD) WITH PROPOFOL ;  Surgeon: Golda Claudis PENNER, MD;  Location: [facility];  Service: Endoscopy;  Laterality: N/A;   POLYPECTOMY  01/03/2019   Procedure: POLYPECTOMY;  Surgeon: Golda Claudis PENNER, MD;  Location: [facility];  Service: Endoscopy;;     HPI  from the history and physical done on the day of admission:     Patient coming from: Home   I have personally briefly reviewed patient's old medical records in [facility]   Chief Complaint: Difficulty breathing, altered mental status   HPI: Susan Hodge is a 73 y.o. female with medical history significant for neurogenic bladder, fibromyalgia, migraines, left hemiplegia, anxiety and depression, chronic pain. Patient was  brought to the ED by EMS with reports of weakness, cough of 3 days duration, family also noted confusion and lethargy.  At time of my evaluation, patient is quite lethargic, appears quite weak but follows directions, answers some questions.     ED Course: O2 sats dropping to low 80s on room air, placed on 2 L.  Tmax 100.1.  Heart rate 77 -102.  Respiratory rate 19-30.  Blood pressure systolic 104-114.  ALP elevated at 227 but other liver enzymes normal.  WBC 8.4.  proBNP 947.  Lactic acid 1 x 2.  COVID influenza RSV negative.  Chest x-ray suggesting right middle and lower lobe pneumonia. CTA chest negative for PE, confirms right middle and right lower lobe consolidation also suggest multilobar pneumonia. CTAP WC possible mild wall thickening of the ascending and proximal transverse colon, potentially  due to underdistention or mild colitis.  No evidence of bowel obstruction or perforation. Head CT negative for acute abnormality, shows generalized atrophy. Patient is on broad-spectrum antibiotics IV vancomycin and cefepime and metronidazole, azithromycin later added.  1.7 to 5 L LR bolus given, and continued at 150 cc/h.    Review of Systems: As per HPI all other systems reviewed and negative.   Hospital Course:   Assessment and Plan: Severe sepsis due to RML/RLL CAP: POA  - Treated with Rocephin , azithromycin bronchodilators and mucolytics.   -Legionella Ag Neg, Pneumococcal Ag neg.   -Overall much improved Okay to discharge on Omnicef, azithromycin along with mucolytics and bronchodilators- -repeat chest x-ray #3 with PCP to exclude underlying malignancy   2)Acute hypoxic respiratory failure:  CTA chest negative for PE, shows diffuse and patchy airspace opacities, consistent with multilobar pneumonia and right middle and lower lobe consolidation. -Required up to 3 L of oxygen initially -Weaned off oxygen completely -Post ambulation O2 sats on room air today is 94 to 95% - 3)Acute Metabolic Encephalopathy:  -  Head CT negative for acute abnormality.  - Encephalopathy was mostly due to polypharmacy pneumonia with sepsis Polypharmacy patient is on multiple psychoactive medications-Xanax , baclofen , hydroxyzine , oxycodone , Lyrica , valproic  acid.  -Mentation is back to baseline - Resume PTA  xanax  , percocet, Valproic  acid qHS to help suppress migraines.   4)Chronic pain: - Restart medications as able as discussed above.  -Restart PTA pain medications and follow-up with PCP for adjustment   HTN:  -Okay to restart low-dose HCTZ with potassium supplementation   Neuromyelitis optica:  - Continue outpatient ultomiris injections per Dr. Vear   Hypokalemia:  -Due to HCTZ use, replaced and normalized   Isolated alkaline phosphatase elevation:  - alk phos 227 >> 156  Discharge  Condition: stable  Follow UP   Contact information for follow-up providers     Maree Isles, MD. Schedule an appointment as soon as possible for a visit in 1 week(s).   Specialty: Internal Medicine Contact information: [address] [place] [address] [phone]              Contact information for after-discharge care     Home Medical Care     [facility]) .   Service: Home Health Services Contact information: [phone] [place] [address] [place]  [phone]                    Diet and Activity recommendation:  As advised  Discharge Instructions   Discharge Instructions     Call MD for:  difficulty breathing, headache or visual disturbances  Complete by: As directed    Call MD for:  persistant dizziness or light-headedness   Complete by: As directed    Call MD for:  temperature >100.4   Complete by: As directed    Diet - low sodium heart healthy   Complete by: As directed    Discharge instructions   Complete by: As directed    1) please note that there has been a few changes to your medications 2) please use nebulizer machine for nebulizer treatments to help your breathing as advised 3) follow-up with primary care physician in  3 weeks for repeat chest x-ray to exclude underlying malignancy and also for recheck and reevaluation   For home use only DME Nebulizer machine   Complete by: As directed    Patient needs a nebulizer to treat with the following condition: Dyspnea and respiratory abnormalities   Length of Need: Lifetime   Additional equipment included:  Administration kit Filter     Increase activity slowly   Complete by: As directed          Discharge Medications     Allergies as of 02/18/2024       Reactions   Prednisone  Other (See Comments)   Not to be given at any point while patient is still taking Soliris  infusions.  Severe shaking and dehydration.   Amitriptyline Other (See Comments)    Psychosis   Amitriptyline Hcl Other (See Comments)   Unknown    Bee Venom Other (See Comments)   Unknown    Fexofenadine Other (See Comments)   Unknown    Furosemide Other (See Comments)   Unknown   Gabapentin Other (See Comments)   Unknown   Hydroxychloroquine Sulfate Other (See Comments)   Unknown    Ibuprofen Other (See Comments)   Unknown    Morphine Other (See Comments)   Unknown    Nabumetone Other (See Comments)   Unknown    Nsaids Other (See Comments)   Unknown    Tizanidine  Other (See Comments)   Unknown    Topiramate Other (See Comments)   Unknown    Cymbalta [duloxetine Hcl] Nausea Only, Other (See Comments)   Constipation   Oxycodone  Other (See Comments)   Dizzy         Medication List     STOP taking these medications    loratadine  10 MG tablet Commonly known as: CLARITIN        TAKE these medications    acetaminophen  325 MG tablet Commonly known as: TYLENOL  Take 2 tablets (650 mg total) by mouth every 6 (six) hours as needed for mild pain (or Fever >/= 101).   albuterol  (2.5 MG/3ML) 0.083% nebulizer solution Commonly known as: PROVENTIL  Take 3 mLs (2.5 mg total) by nebulization every 4 (four) hours as needed for wheezing or shortness of breath.   ALPRAZolam  0.5 MG tablet Commonly known as: XANAX  Take 1 tablet (0.5 mg total) by mouth 3 (three) times daily as needed for anxiety or sleep.   azithromycin 500 MG tablet Commonly known as: ZITHROMAX Take 1 tablet (500 mg total) by mouth daily for 3 days.   baclofen  10 MG tablet Commonly known as: LIORESAL  Take one pill po tid What changed:  how much to take how to take this when to take this   butalbital-acetaminophen -caffeine 50-325-40 MG tablet Commonly known as: FIORICET Take 1 tablet by mouth every 12 (twelve) hours as needed for headache or migraine.   cefdinir 300 MG capsule Commonly known as: OMNICEF Take  1 capsule (300 mg total) by mouth 2 (two) times daily for 5 days.    Compressor/Nebulizer Misc 1 Units by Does not apply route daily as needed.   guaiFENesin 600 MG 12 hr tablet Commonly known as: Mucinex Take 1 tablet (600 mg total) by mouth 2 (two) times daily for 10 days.   hydrochlorothiazide 12.5 MG capsule Commonly known as: MICROZIDE Take 12.5 mg by mouth daily.   hydrOXYzine  10 MG tablet Commonly known as: ATARAX  Take 10 mg by mouth every 8 (eight) hours as needed for itching or anxiety.   lidocaine  5 % Commonly known as: LIDODERM  Place 1 patch onto the skin every 12 (twelve) hours.   Linzess 145 MCG Caps capsule Generic drug: linaclotide Take 145 mcg by mouth every morning.   mirabegron  ER 50 MG Tb24 tablet Commonly known as: Myrbetriq  Take 1 tablet (50 mg total) by mouth daily.   ondansetron  4 MG tablet Commonly known as: ZOFRAN  Take 1 tablet (4 mg total) by mouth every 6 (six) hours as needed for nausea. What changed: reasons to take this   oxyCODONE -acetaminophen  10-325 MG tablet Commonly known as: PERCOCET Take 1 tablet by mouth every 6 (six) hours as needed. What changed: when to take this   pantoprazole  40 MG tablet Commonly known as: PROTONIX  Take 1 tablet (40 mg total) by mouth 2 (two) times daily before a meal.   potassium chloride SA 20 MEQ tablet Commonly known as: KLOR-CON M Take 20 mEq by mouth 2 (two) times daily.   pregabalin  50 MG capsule Commonly known as: LYRICA  Take 50 mg by mouth 3 (three) times daily.   Soliris  300 MG/30ML Soln injection Generic drug: eculizumab  Inject 1,200 mg into the vein every 14 (fourteen) days.   Ultomiris 300 MG/3ML Soln injection Generic drug: ravulizumab-cwvz   UNABLE TO FIND Apply 1 Application topically daily as needed (pain). CBD OIL for pain   UNABLE TO FIND Take 1 tablet by mouth daily. Fiber gummies   valproic  acid 250 MG capsule Commonly known as: DEPAKENE  Take 1 capsule (250 mg total) by mouth at bedtime.   VITAMIN B-12 PO Take 2 tablets by mouth  daily.   Vitamin D  (Ergocalciferol ) 1.25 MG (50000 UNIT) Caps capsule Commonly known as: DRISDOL  Take 1 capsule (50,000 Units total) by mouth every Monday.   vitamin E  1000 UNIT capsule Take 1,000 Units by mouth daily.               Durable Medical Equipment  (From admission, onward)           Start     Ordered   02/18/24 0000  For home use only DME Nebulizer machine       Question Answer Comment  Patient needs a nebulizer to treat with the following condition Dyspnea and respiratory abnormalities   Length of Need Lifetime   Additional equipment included Administration kit   Additional equipment included Filter      02/18/24 1324            Major procedures and Radiology Reports - PLEASE review detailed and final reports for all details, in brief -   CT Angio Chest PE W and/or Wo Contrast Result Date: 02/15/2024 CLINICAL DATA:  Pulmonary embolism (PE) suspected, high prob; Abdominal pain, acute, nonlocalized Sepsis Lethargy, fatigue, weakness and cough.  History of lupus. EXAM: CT ANGIOGRAPHY CHEST CT ABDOMEN AND PELVIS WITH CONTRAST TECHNIQUE: Multidetector CT imaging of the chest was performed using the standard protocol during bolus administration  of intravenous contrast. Multiplanar CT image reconstructions and MIPs were obtained to evaluate the vascular anatomy. Multidetector CT imaging of the abdomen and pelvis was performed using the standard protocol during bolus administration of intravenous contrast. RADIATION DOSE REDUCTION: This exam was performed according to the departmental dose-optimization program which includes automated exposure control, adjustment of the mA and/or kV according to patient size and/or use of iterative reconstruction technique. CONTRAST:  80mL OMNIPAQUE  IOHEXOL  350 MG/ML SOLN COMPARISON:  Abdominopelvic CT 11/28/2021 and 11/13/2021. Chest radiographs 02/15/2024. No comparison chest CT. FINDINGS: CTA CHEST FINDINGS Cardiovascular: The  pulmonary arteries are well opacified with contrast to the level of the segmental branches. There is no evidence of acute pulmonary embolism. There is atherosclerosis of the aorta, great vessels and coronary arteries. No acute systemic arterial abnormalities are identified. The heart size is normal. There is no pericardial effusion. Mediastinum/Nodes: Nonspecific, mildly prominent mediastinal and hilar lymph nodes, including a right hilar node measuring 1.2 cm and a subcarinal node measuring 1.3 cm on image 42/503. No axillary adenopathy. The thyroid  gland, trachea and esophagus demonstrate no significant findings. Lungs/Pleura: Pulmonary assessment mildly limited by breathing artifact. As seen on earlier radiographs, there is right middle and lower lobe consolidation with diffuse central airway thickening and patchy peribronchovascular airspace opacities in the aerated portions of both lungs. No left lung consolidation demonstrated. There is a small right pleural effusion. No pneumothorax. Mild paraseptal emphysematous changes are present in both lung apices. Musculoskeletal/Chest wall: No chest wall mass or suspicious osseous findings. CT ABDOMEN AND PELVIS FINDINGS Hepatobiliary: The liver is normal in density without suspicious focal abnormality. Status post cholecystectomy without evidence of significant biliary dilatation. Pancreas: Unchanged chronic low-density lesion within the uncinate process, measuring 1.9 x 1.1 cm, likely an incidental cystic indolent neoplasm. No pancreatic ductal dilatation or surrounding inflammation. Spleen: Normal in size without focal abnormality. Adrenals/Urinary Tract: Both adrenal glands appear normal. No evidence of urinary tract calculus, suspicious renal lesion or hydronephrosis. A Foley catheter is in place with a small amount of air in the bladder lumen. No focal bladder abnormalities are identified. Stomach/Bowel: No enteric contrast administered. The stomach appears  unremarkable for its degree of distention. No small bowel distension, wall thickening or surrounding inflammation. The proximal colon is decompressed with possible mild wall thickening of the ascending and proximal transverse colon. No other colonic wall thickening identified. There is mildly prominent stool and scattered diverticulosis in the distal colon. Vascular/Lymphatic: There are no enlarged abdominal or pelvic lymph nodes. Aortic and branch vessel atherosclerosis without evidence of aneurysm or large vessel occlusion. Reproductive: Retroverted uterus with unchanged 2.7 cm right fundal fibroid. No adnexal mass. Other: No evidence of abdominal wall mass or hernia. No ascites or pneumoperitoneum. Musculoskeletal: No acute or significant osseous findings. Convex left lumbar scoliosis with mild multilevel spondylosis. Review of the MIP images confirms the above findings. IMPRESSION: 1. No evidence of acute pulmonary embolism or other acute vascular findings in the chest. 2. Right middle and lower lobe consolidation with diffuse central airway thickening and patchy peribronchovascular airspace opacities in the aerated portions of both lungs, most consistent with multilobar pneumonia. Radiographic follow-up recommended to document clearing. 3. Nonspecific mildly prominent mediastinal and hilar lymph nodes, likely reactive. 4. Possible mild wall thickening of the ascending and proximal transverse colon, potentially due to underdistention or mild colitis. Correlate clinically. No evidence of bowel obstruction or perforation. 5. Stable chronic low-density lesion in the uncinate process of the pancreas, likely an incidental cystic indolent neoplasm.  This does not appear significantly changed from available priors dating back to 12/31/2018. Older comparison studies are unavailable. 6. Aortic Atherosclerosis (ICD10-I70.0) and Emphysema (ICD10-J43.9). Electronically Signed   By: Elsie Perone M.D.   On: 02/15/2024 17:41    CT ABDOMEN PELVIS W CONTRAST Result Date: 02/15/2024 CLINICAL DATA:  Pulmonary embolism (PE) suspected, high prob; Abdominal pain, acute, nonlocalized Sepsis Lethargy, fatigue, weakness and cough.  History of lupus. EXAM: CT ANGIOGRAPHY CHEST CT ABDOMEN AND PELVIS WITH CONTRAST TECHNIQUE: Multidetector CT imaging of the chest was performed using the standard protocol during bolus administration of intravenous contrast. Multiplanar CT image reconstructions and MIPs were obtained to evaluate the vascular anatomy. Multidetector CT imaging of the abdomen and pelvis was performed using the standard protocol during bolus administration of intravenous contrast. RADIATION DOSE REDUCTION: This exam was performed according to the departmental dose-optimization program which includes automated exposure control, adjustment of the mA and/or kV according to patient size and/or use of iterative reconstruction technique. CONTRAST:  80mL OMNIPAQUE  IOHEXOL  350 MG/ML SOLN COMPARISON:  Abdominopelvic CT 11/28/2021 and 11/13/2021. Chest radiographs 02/15/2024. No comparison chest CT. FINDINGS: CTA CHEST FINDINGS Cardiovascular: The pulmonary arteries are well opacified with contrast to the level of the segmental branches. There is no evidence of acute pulmonary embolism. There is atherosclerosis of the aorta, great vessels and coronary arteries. No acute systemic arterial abnormalities are identified. The heart size is normal. There is no pericardial effusion. Mediastinum/Nodes: Nonspecific, mildly prominent mediastinal and hilar lymph nodes, including a right hilar node measuring 1.2 cm and a subcarinal node measuring 1.3 cm on image 42/503. No axillary adenopathy. The thyroid  gland, trachea and esophagus demonstrate no significant findings. Lungs/Pleura: Pulmonary assessment mildly limited by breathing artifact. As seen on earlier radiographs, there is right middle and lower lobe consolidation with diffuse central airway  thickening and patchy peribronchovascular airspace opacities in the aerated portions of both lungs. No left lung consolidation demonstrated. There is a small right pleural effusion. No pneumothorax. Mild paraseptal emphysematous changes are present in both lung apices. Musculoskeletal/Chest wall: No chest wall mass or suspicious osseous findings. CT ABDOMEN AND PELVIS FINDINGS Hepatobiliary: The liver is normal in density without suspicious focal abnormality. Status post cholecystectomy without evidence of significant biliary dilatation. Pancreas: Unchanged chronic low-density lesion within the uncinate process, measuring 1.9 x 1.1 cm, likely an incidental cystic indolent neoplasm. No pancreatic ductal dilatation or surrounding inflammation. Spleen: Normal in size without focal abnormality. Adrenals/Urinary Tract: Both adrenal glands appear normal. No evidence of urinary tract calculus, suspicious renal lesion or hydronephrosis. A Foley catheter is in place with a small amount of air in the bladder lumen. No focal bladder abnormalities are identified. Stomach/Bowel: No enteric contrast administered. The stomach appears unremarkable for its degree of distention. No small bowel distension, wall thickening or surrounding inflammation. The proximal colon is decompressed with possible mild wall thickening of the ascending and proximal transverse colon. No other colonic wall thickening identified. There is mildly prominent stool and scattered diverticulosis in the distal colon. Vascular/Lymphatic: There are no enlarged abdominal or pelvic lymph nodes. Aortic and branch vessel atherosclerosis without evidence of aneurysm or large vessel occlusion. Reproductive: Retroverted uterus with unchanged 2.7 cm right fundal fibroid. No adnexal mass. Other: No evidence of abdominal wall mass or hernia. No ascites or pneumoperitoneum. Musculoskeletal: No acute or significant osseous findings. Convex left lumbar scoliosis with mild  multilevel spondylosis. Review of the MIP images confirms the above findings. IMPRESSION: 1. No evidence of acute pulmonary embolism or  other acute vascular findings in the chest. 2. Right middle and lower lobe consolidation with diffuse central airway thickening and patchy peribronchovascular airspace opacities in the aerated portions of both lungs, most consistent with multilobar pneumonia. Radiographic follow-up recommended to document clearing. 3. Nonspecific mildly prominent mediastinal and hilar lymph nodes, likely reactive. 4. Possible mild wall thickening of the ascending and proximal transverse colon, potentially due to underdistention or mild colitis. Correlate clinically. No evidence of bowel obstruction or perforation. 5. Stable chronic low-density lesion in the uncinate process of the pancreas, likely an incidental cystic indolent neoplasm. This does not appear significantly changed from available priors dating back to 12/31/2018. Older comparison studies are unavailable. 6. Aortic Atherosclerosis (ICD10-I70.0) and Emphysema (ICD10-J43.9). Electronically Signed   By: Elsie Perone M.D.   On: 02/15/2024 17:41   CT Head Wo Contrast Result Date: 02/15/2024 CLINICAL DATA:  Altered mental status. EXAM: CT HEAD WITHOUT CONTRAST TECHNIQUE: Contiguous axial images were obtained from the base of the skull through the vertex without intravenous contrast. RADIATION DOSE REDUCTION: This exam was performed according to the departmental dose-optimization program which includes automated exposure control, adjustment of the mA and/or kV according to patient size and/or use of iterative reconstruction technique. COMPARISON:  None Available. FINDINGS: Brain: There is generalized cerebral atrophy with widening of the extra-axial spaces and ventricular dilatation. There are areas of decreased attenuation within the white matter tracts of the supratentorial brain, consistent with microvascular disease changes. A  chronic right frontoparietal infarct is seen. Vascular: No hyperdense vessel or unexpected calcification. Skull: Normal. Negative for fracture or focal lesion. Sinuses/Orbits: There is marked severity bilateral maxillary sinus, bilateral nasal mucosal and bilateral ethmoid sinus mucosal thickening. Other: None. IMPRESSION: 1. Generalized cerebral atrophy and microvascular disease changes of the supratentorial brain. 2. Chronic right frontoparietal infarct. 3. Marked severity bilateral maxillary sinus, bilateral nasal mucosal and bilateral ethmoid sinus disease. Electronically Signed   By: Suzen Dials M.D.   On: 02/15/2024 17:26   DG Chest Port 1 View Result Date: 02/15/2024 CLINICAL DATA:  Questionable sepsis.  Lethargy and fatigue.  Cough. EXAM: PORTABLE CHEST 1 VIEW COMPARISON:  11/28/2021. FINDINGS: Patient is rotated. Trachea is midline. Heart size within normal limits. Right middle and right lower lobe airspace opacification. Left lung is grossly clear. No left pleural fluid. There may be a small right pleural effusion. IMPRESSION: Right middle and right lower lobe pneumonia with a probable small right pleural effusion. Followup PA and lateral chest X-ray is recommended in 3-4 weeks following trial of antibiotic therapy to ensure resolution and exclude underlying malignancy. Electronically Signed   By: Newell Eke M.D.   On: 02/15/2024 15:07   Micro Results  Recent Results (from the past 240 hours)  Blood Culture (routine x 2)     Status: None (Preliminary result)   Collection Time: 02/15/24  1:50 PM   Specimen: Left Antecubital; Blood  Result Value Ref Range Status   Specimen Description   Final    LEFT ANTECUBITAL BOTTLES DRAWN AEROBIC AND ANAEROBIC   Special Requests Blood Culture adequate volume  Final   Culture   Final    NO GROWTH 3 DAYS Performed at [facility], [address] [place], [place] [address]    Report Status PENDING  Incomplete  Blood Culture (routine x 2)      Status: None (Preliminary result)   Collection Time: 02/15/24  1:52 PM   Specimen: BLOOD RIGHT HAND  Result Value Ref Range Status   Specimen Description  Final    BLOOD RIGHT HAND BOTTLES DRAWN AEROBIC AND ANAEROBIC   Special Requests   Final    Blood Culture results may not be optimal due to an inadequate volume of blood received in culture bottles   Culture   Final    NO GROWTH 3 DAYS Performed at [facility], [address], [place], [place] [address]    Report Status PENDING  Incomplete  Resp panel by RT-PCR (RSV, Flu A&B, Covid) Anterior Nasal Swab     Status: None   Collection Time: 02/15/24  2:54 PM   Specimen: Anterior Nasal Swab  Result Value Ref Range Status   SARS Coronavirus 2 by RT PCR NEGATIVE NEGATIVE Final    Comment: (NOTE) SARS-CoV-2 target nucleic acids are NOT DETECTED.  The SARS-CoV-2 RNA is generally detectable in upper respiratory specimens during the acute phase of infection. The lowest concentration of SARS-CoV-2 viral copies this assay can detect is 138 copies/mL. A negative result does not preclude SARS-Cov-2 infection and should not be used as the sole basis for treatment or other patient management decisions. A negative result may occur with  improper specimen collection/handling, submission of specimen other than nasopharyngeal swab, presence of viral mutation(s) within the areas targeted by this assay, and inadequate number of viral copies(<138 copies/mL). A negative result must be combined with clinical observations, patient history, and epidemiological information. The expected result is Negative.  Fact Sheet for Patients:  BloggerCourse.com  Fact Sheet for Healthcare Providers:  SeriousBroker.it  This test is no t yet approved or cleared by the [place]  FDA and  has been authorized for detection and/or diagnosis of SARS-CoV-2 by FDA under an Emergency Use Authorization (EUA). This EUA  will remain  in effect (meaning this test can be used) for the duration of the COVID-19 declaration under Section 564(b)(1) of the Act, 21 U.S.C.section 360bbb-3(b)(1), unless the authorization is terminated  or revoked sooner.       Influenza A by PCR NEGATIVE NEGATIVE Final   Influenza B by PCR NEGATIVE NEGATIVE Final    Comment: (NOTE) The Xpert Xpress SARS-CoV-2/FLU/RSV plus assay is intended as an aid in the diagnosis of influenza from Nasopharyngeal swab specimens and should not be used as a sole basis for treatment. Nasal washings and aspirates are unacceptable for Xpert Xpress SARS-CoV-2/FLU/RSV testing.  Fact Sheet for Patients: BloggerCourse.com  Fact Sheet for Healthcare Providers: SeriousBroker.it  This test is not yet approved or cleared by the [place]  FDA and has been authorized for detection and/or diagnosis of SARS-CoV-2 by FDA under an Emergency Use Authorization (EUA). This EUA will remain in effect (meaning this test can be used) for the duration of the COVID-19 declaration under Section 564(b)(1) of the Act, 21 U.S.C. section 360bbb-3(b)(1), unless the authorization is terminated or revoked.     Resp Syncytial Virus by PCR NEGATIVE NEGATIVE Final    Comment: (NOTE) Fact Sheet for Patients: BloggerCourse.com  Fact Sheet for Healthcare Providers: SeriousBroker.it  This test is not yet approved or cleared by the [place]  FDA and has been authorized for detection and/or diagnosis of SARS-CoV-2 by FDA under an Emergency Use Authorization (EUA). This EUA will remain in effect (meaning this test can be used) for the duration of the COVID-19 declaration under Section 564(b)(1) of the Act, 21 U.S.C. section 360bbb-3(b)(1), unless the authorization is terminated or revoked.  Performed at [facility], [address], [place], [place] [address]    Expectorated Sputum Assessment w Gram Stain, Rflx to  Resp Cult     Status: None   Collection Time: 02/16/24  5:54 PM   Specimen: Sputum  Result Value Ref Range Status   Specimen Description SPUTUM  Final   Special Requests NONE  Final   Sputum evaluation   Final    Sputum specimen not acceptable for testing.  Please recollect.   Gram Stain Report Called to,Read Back By and Verified With: ALYSSA JACKSON @ 1807 ON 02/16/24 C VARNER Performed at [facility], [address] [place], [place] [address]    Report Status 02/16/2024 FINAL  Final    Today   Subjective    Susan Hodge today has no new complaints  No fever  Or chills  - Cough and dyspnea improved - Mentation back to baseline - Ambulated in hallways and O2 sats 94 to 95% on room air          Patient has been seen and examined prior to discharge   Objective   Blood pressure 135/63, pulse 68, temperature 98.2 F (36.8 C), temperature source Oral, resp. rate 16, height 5' 3 (1.6 m), weight 64.9 kg, SpO2 97%.   Intake/Output Summary (Last 24 hours) at 02/18/2024 1336 Last data filed at 02/18/2024 1012 Gross per 24 hour  Intake 120 ml  Output --  Net 120 ml    Exam Gen:- Awake Alert, no acute distress  HEENT:- [place].AT, No sclera icterus Neck-Supple Neck,No JVD,.  Lungs-improved air movement, no wheezing CV- S1, S2 normal, regular Abd-  +ve B.Sounds, Abd Soft, No tenderness,    Extremity/Skin:- No  edema,   good pulses Psych-affect is appropriate, oriented x3 Neuro-no new focal deficits, no tremors    Data Review   CBC w Diff:  Lab Results  Component Value Date   WBC 5.3 02/17/2024   HGB 9.0 (L) 02/17/2024   HGB 11.4 11/12/2023   HCT 26.9 (L) 02/17/2024   HCT 33.6 (L) 11/12/2023   PLT 153 02/17/2024   PLT 129 (L) 11/12/2023   LYMPHOPCT 8 02/15/2024   MONOPCT 11 02/15/2024   EOSPCT 0 02/15/2024   BASOPCT 0 02/15/2024   CMP:  Lab Results  Component Value Date   NA 139 02/17/2024   NA 137  11/12/2023   K 3.6 02/17/2024   CL 106 02/17/2024   CO2 19 (L) 02/17/2024   BUN 9 02/17/2024   BUN 17 11/12/2023   CREATININE 0.70 02/17/2024   PROT 6.0 (L) 02/17/2024   PROT 6.7 11/12/2023   ALBUMIN 2.6 (L) 02/17/2024   ALBUMIN 3.8 11/12/2023   BILITOT <0.2 02/17/2024   BILITOT 0.2 11/12/2023   ALKPHOS 156 (H) 02/17/2024   AST 15 02/17/2024   ALT 8 02/17/2024   Total Discharge time is about 33 minutes  Rendall Carwin M.D on 02/18/2024 at 1:36 PM  Go to [email or web address] -  for contact info  [facility] - Office  [phone]

## 2024-02-18 NOTE — Progress Notes (Signed)
 Physical Therapy Treatment Patient Details Name: Susan Hodge MRN: [id number] DOB: 07/12/1950 Today's Date: 02/18/2024   History of Present Illness Susan Hodge is a 73 y.o. female with medical history significant for neurogenic bladder, fibromyalgia, migraines, left hemiplegia, anxiety and depression, chronic pain.  Patient was brought to the ED by EMS with reports of weakness, cough of 3 days duration, family also noted confusion and lethargy.  At time of my evaluation, patient is quite lethargic, appears quite weak but follows directions, answers some questions.    PT Comments  Patient demonstrates increased endurance/distance for gait training without loss of balance suing RW, on room air with SpO2 at 95% (nurse notified) and tolerated sitting up in chair after therapy. Patient will benefit from continued skilled physical therapy in hospital and recommended venue below to increase strength, balance, endurance for safe ADLs and gait.     If plan is discharge home, recommend the following: A little help with walking and/or transfers;A little help with bathing/dressing/bathroom;Help with stairs or ramp for entrance;Assistance with cooking/housework;Assist for transportation   Can travel by private vehicle        Equipment Recommendations  None recommended by PT    Recommendations for Other Services       Precautions / Restrictions Precautions Precautions: Fall Recall of Precautions/Restrictions: Intact Restrictions Weight Bearing Restrictions Per Provider Order: No     Mobility  Bed Mobility Overal bed mobility: Needs Assistance Bed Mobility: Sit to Supine     Supine to sit: Contact guard, Min assist     General bed mobility comments: had difficulty moving LLE due to increasing pain    Transfers Overall transfer level: Needs assistance Equipment used: Rolling walker (2 wheels) Transfers: Sit to/from Stand, Bed to chair/wheelchair/BSC Sit to Stand: Contact guard  assist   Step pivot transfers: Contact guard assist       General transfer comment: improved balance for completing sit to stands/transfers    Ambulation/Gait Ambulation/Gait assistance: Contact guard assist Gait Distance (Feet): 55 Feet Assistive device: Rollator (4 wheels) Gait Pattern/deviations: Decreased step length - right, Decreased step length - left, Decreased stride length, Trunk flexed Gait velocity: decreased     General Gait Details: increased dndurance/distance for gait training with slow labored movement without loss of balance, limited mostly due to c/o cramping in LLE, fatigue, on room air with SpO2 at 95%   Stairs             Wheelchair Mobility     Tilt Bed    Modified Rankin (Stroke Patients Only)       Balance Overall balance assessment: Needs assistance Sitting-balance support: Feet supported, No upper extremity supported Sitting balance-Leahy Scale: Fair Sitting balance - Comments: fair/good seated at EOB   Standing balance support: Reliant on assistive device for balance, During functional activity, Bilateral upper extremity supported Standing balance-Leahy Scale: Fair Standing balance comment: using RW                            Communication Communication Factors Affecting Communication: Hearing impaired  Cognition Arousal: Alert Behavior During Therapy: WFL for tasks assessed/performed, Anxious   PT - Cognitive impairments: No apparent impairments                         Following commands: Intact      Cueing Cueing Techniques: Verbal cues, Tactile cues, Visual cues  Exercises General Exercises - Lower Extremity  Ankle Circles/Pumps: Seated, AROM, Strengthening, Both, 10 reps Long Arc Quad: Seated, AROM, Strengthening, Both, 15 reps Hip Flexion/Marching: Seated, AROM, Strengthening, Both, 15 reps    General Comments        Pertinent Vitals/Pain Pain Assessment Pain Assessment: Faces Faces Pain  Scale: Hurts little more Pain Location: LLE Pain Descriptors / Indicators: Discomfort, Sore, Grimacing Pain Intervention(s): Limited activity within patient's tolerance, Monitored during session, Repositioned    Home Living                          Prior Function            PT Goals (current goals can now be found in the care plan section) Acute Rehab PT Goals Patient Stated Goal: return home with family assist PT Goal Formulation: With patient Time For Goal Achievement: 02/19/24 Potential to Achieve Goals: Good Progress towards PT goals: Progressing toward goals    Frequency    Min 3X/week      PT Plan      Co-evaluation              AM-PAC PT 6 Clicks Mobility   Outcome Measure  Help needed turning from your back to your side while in a flat bed without using bedrails?: A Little Help needed moving from lying on your back to sitting on the side of a flat bed without using bedrails?: A Little Help needed moving to and from a bed to a chair (including a wheelchair)?: A Little Help needed standing up from a chair using your arms (e.g., wheelchair or bedside chair)?: A Little Help needed to walk in hospital room?: A Little Help needed climbing 3-5 steps with a railing? : A Lot 6 Click Score: 17    End of Session   Activity Tolerance: Patient tolerated treatment well;Patient limited by fatigue Patient left: in chair;with call bell/phone within reach Nurse Communication: Mobility status PT Visit Diagnosis: Unsteadiness on feet (R26.81);Other abnormalities of gait and mobility (R26.89);Muscle weakness (generalized) (M62.81)     Time: 8896-8866 PT Time Calculation (min) (ACUTE ONLY): 30 min  Charges:    $Gait Training: 8-22 mins $Therapeutic Exercise: 8-22 mins PT General Charges $$ ACUTE PT VISIT: 1 Visit                     2:37 PM, 02/18/24 Lynwood Music, MPT Physical Therapist with [facility] 336 [phone]  office [phone] mobile phone

## 2024-02-18 NOTE — Progress Notes (Signed)
 Ms. Lasser was very restless and requested her PRN xanax  after having a complete bed change ad bath

## 2024-02-18 NOTE — Care Management Important Message (Signed)
 Important Message  Patient Details  Name: Susan Hodge MRN: [id number] Date of Birth: 09/23/1950   Important Message Given:  Yes - Medicare IM     Suvan Stcyr L Marquest Gunkel 02/18/2024, 1:24 PM

## 2024-02-20 LAB — CULTURE, BLOOD (ROUTINE X 2)
Culture: NO GROWTH
Culture: NO GROWTH
Special Requests: ADEQUATE

## 2024-02-29 NOTE — Telephone Encounter (Signed)
 Update from [facility]:     Sent response asking if she had labs redone since 01/08/24 labs could not be processed. I also asked if pt had MenQuadfi vaccine booster. Waiting on response.

## 2024-02-29 NOTE — Telephone Encounter (Signed)
 Per Marry:

## 2024-03-21 DIAGNOSIS — M25551 Pain in right hip: Secondary | ICD-10-CM | POA: Diagnosis not present

## 2024-03-21 DIAGNOSIS — M329 Systemic lupus erythematosus, unspecified: Secondary | ICD-10-CM | POA: Diagnosis not present

## 2024-03-21 DIAGNOSIS — M25512 Pain in left shoulder: Secondary | ICD-10-CM | POA: Diagnosis not present

## 2024-03-21 DIAGNOSIS — M25511 Pain in right shoulder: Secondary | ICD-10-CM | POA: Diagnosis not present

## 2024-03-21 DIAGNOSIS — M542 Cervicalgia: Secondary | ICD-10-CM | POA: Diagnosis not present

## 2024-03-21 DIAGNOSIS — M25552 Pain in left hip: Secondary | ICD-10-CM | POA: Diagnosis not present

## 2024-03-21 DIAGNOSIS — M549 Dorsalgia, unspecified: Secondary | ICD-10-CM | POA: Diagnosis not present

## 2024-03-21 DIAGNOSIS — Z79891 Long term (current) use of opiate analgesic: Secondary | ICD-10-CM | POA: Diagnosis not present

## 2024-03-21 DIAGNOSIS — G43809 Other migraine, not intractable, without status migrainosus: Secondary | ICD-10-CM | POA: Diagnosis not present

## 2024-03-21 DIAGNOSIS — G894 Chronic pain syndrome: Secondary | ICD-10-CM | POA: Diagnosis not present

## 2024-03-21 DIAGNOSIS — K5903 Drug induced constipation: Secondary | ICD-10-CM | POA: Diagnosis not present

## 2024-03-25 ENCOUNTER — Other Ambulatory Visit: Payer: Self-pay

## 2024-03-25 MED ORDER — MIRABEGRON ER 50 MG PO TB24: 50.0000 mg | ORAL_TABLET | Freq: Every day | ORAL | 11 refills | Status: AC

## 2024-04-20 ENCOUNTER — Ambulatory Visit: Admitting: Neurology

## 2024-04-29 ENCOUNTER — Other Ambulatory Visit: Payer: Self-pay | Admitting: Internal Medicine

## 2024-04-29 DIAGNOSIS — Z1231 Encounter for screening mammogram for malignant neoplasm of breast: Secondary | ICD-10-CM

## 2024-05-03 ENCOUNTER — Ambulatory Visit: Admitting: Neurology

## 2024-05-03 ENCOUNTER — Inpatient Hospital Stay: Admission: RE | Admit: 2024-05-03 | Source: Ambulatory Visit

## 2024-05-10 ENCOUNTER — Telehealth: Payer: Self-pay | Admitting: Neurology

## 2024-05-10 NOTE — Telephone Encounter (Signed)
 [facility] conf

## 2024-05-12 ENCOUNTER — Encounter: Payer: Self-pay | Admitting: Neurology

## 2024-05-12 ENCOUNTER — Ambulatory Visit: Admitting: Neurology

## 2024-05-23 ENCOUNTER — Other Ambulatory Visit: Payer: Self-pay | Admitting: Neurology

## 2024-05-23 NOTE — Telephone Encounter (Signed)
 [facility] pharmacy called to request to speak to nurse about Pt medication .  ULTOMIRIS 300 MG/3ML SOLN injection  Pharmacy stated that Pt will need a new Pharmacy  for medication due to insurance changed .Susan Hodge Did inform pharmacy it looks like Pt has not been  getting this medication  , However Pharmacy will like to speak to nurse   Callback number is  [phone]

## 2024-05-25 ENCOUNTER — Other Ambulatory Visit (HOSPITAL_COMMUNITY): Payer: Self-pay

## 2024-05-25 NOTE — Telephone Encounter (Signed)
 Do not see active script on file with directions. Please advise the quantity, how this medication is being used/taken and day supply so that I can complete the request.

## 2024-05-27 ENCOUNTER — Telehealth: Payer: Self-pay | Admitting: Neurology

## 2024-05-27 NOTE — Telephone Encounter (Signed)
 Gabriella -[facility] dropped in and wanted you to be aware that her pharmacy changed for 2026 to [facility] due to cost changes they can't supply the meds anymore.  Any questions or any objections call [phone]

## 2024-05-30 MED ORDER — ULTOMIRIS 300 MG/3ML IV SOLN: INTRAVENOUS | 7 refills | Status: AC

## 2024-05-31 NOTE — Telephone Encounter (Signed)
 Pt called to request to  Speak to CMA about medication ULTOMIRIS  300 MG/3ML SOLN injection . Pt has some concern she rather discuss with CMA

## 2024-05-31 NOTE — Telephone Encounter (Signed)
 noted

## 2024-06-02 ENCOUNTER — Telehealth: Payer: Self-pay | Admitting: Neurology

## 2024-06-02 NOTE — Telephone Encounter (Signed)
 Error

## 2024-06-02 NOTE — Telephone Encounter (Signed)
 Called number back and LVM stating I was returning call from this afternoon. Left office number and hours in message. We will try to call back on Monday unless they call back sooner. [phone]

## 2024-06-02 NOTE — Telephone Encounter (Signed)
 Patient asking to change medication for infusion back because the one changed to making her sick. Would like a call mother back.

## 2024-06-07 NOTE — Telephone Encounter (Signed)
 I called pt.  She said that she has noted since on the Ultomiris  every 9 wks she feels more lethargic, has to push herself harder to do things.  She feels that is not as effective for her as the Soliris  every 2 wk.  This infusion done at home by [facility] pharmacy. I told her that I would check with Dr. Vear and get back with her.  I called the fast start # and referred me to [facility]  Pt has appt 08-08-2024.  Do you want to keep her on the ultomiris  until seen?

## 2024-06-07 NOTE — Telephone Encounter (Signed)
 Pt called in again stating that she needs Dr. Vear to call this number about her IV medication Ultomiris - [facility] [phone].  She would also like a call back @ [phone].

## 2024-06-08 NOTE — Telephone Encounter (Signed)
 Marry from Infusion called  stating she spoke to Pt about either changing infusion site or medication due to insurance. Pt requested she would like to change infusion medication to Solairs . Marry  is requesting for new order to be sent to them and requesting to speak to nurse. I did inform that MD is working on changing Pt medication to Solaris due to conversations in  PT chart   Callback number is  [phone]

## 2024-06-08 NOTE — Telephone Encounter (Signed)
 I called Susan Hodge with [facility] pharmacy.  Pt does not want to go to another specialty pharmacy.  Would like to go back to soliris .  Marry will fax to us  the order and then will do there end to see if can get approved after receiving the order.

## 2024-06-09 NOTE — Telephone Encounter (Signed)
 PA SUBMITTED

## 2024-06-09 NOTE — Telephone Encounter (Signed)
 Susan Hodge with [facility] pharmacy.  Has called to provide the CoverMy Meds Key Code which needs to be submitted to plan, it is :[facility] if there are questions her call back is [phone]

## 2024-08-08 ENCOUNTER — Ambulatory Visit: Admitting: Neurology
